# Patient Record
Sex: Female | Born: 1955 | Race: White | Hispanic: No | Smoking: Former smoker
Health system: Southern US, Community
[De-identification: ages and names within clinical notes are randomized; demographics above are authoritative.]

## PROBLEM LIST (undated history)

## (undated) DIAGNOSIS — F329 Major depressive disorder, single episode, unspecified: Secondary | ICD-10-CM

## (undated) DIAGNOSIS — I10 Essential (primary) hypertension: Secondary | ICD-10-CM

## (undated) DIAGNOSIS — R740 Nonspecific elevation of levels of transaminase and lactic acid dehydrogenase [LDH]: Secondary | ICD-10-CM

## (undated) DIAGNOSIS — T7840XA Allergy, unspecified, initial encounter: Secondary | ICD-10-CM

## (undated) DIAGNOSIS — F32A Depression, unspecified: Secondary | ICD-10-CM

## (undated) DIAGNOSIS — J209 Acute bronchitis, unspecified: Secondary | ICD-10-CM

## (undated) DIAGNOSIS — Z9889 Other specified postprocedural states: Secondary | ICD-10-CM

## (undated) DIAGNOSIS — Z86010 Personal history of colonic polyps: Secondary | ICD-10-CM

## (undated) DIAGNOSIS — J9602 Acute respiratory failure with hypercapnia: Secondary | ICD-10-CM

## (undated) DIAGNOSIS — E785 Hyperlipidemia, unspecified: Secondary | ICD-10-CM

## (undated) DIAGNOSIS — K219 Gastro-esophageal reflux disease without esophagitis: Secondary | ICD-10-CM

## (undated) DIAGNOSIS — E039 Hypothyroidism, unspecified: Secondary | ICD-10-CM

## (undated) DIAGNOSIS — D72829 Elevated white blood cell count, unspecified: Secondary | ICD-10-CM

## (undated) DIAGNOSIS — R112 Nausea with vomiting, unspecified: Secondary | ICD-10-CM

## (undated) DIAGNOSIS — F419 Anxiety disorder, unspecified: Secondary | ICD-10-CM

## (undated) DIAGNOSIS — K7689 Other specified diseases of liver: Secondary | ICD-10-CM

## (undated) DIAGNOSIS — J45909 Unspecified asthma, uncomplicated: Secondary | ICD-10-CM

## (undated) DIAGNOSIS — I350 Nonrheumatic aortic (valve) stenosis: Secondary | ICD-10-CM

## (undated) DIAGNOSIS — J302 Other seasonal allergic rhinitis: Secondary | ICD-10-CM

## (undated) DIAGNOSIS — E119 Type 2 diabetes mellitus without complications: Secondary | ICD-10-CM

## (undated) DIAGNOSIS — E66813 Obesity, class 3: Secondary | ICD-10-CM

## (undated) DIAGNOSIS — C801 Malignant (primary) neoplasm, unspecified: Secondary | ICD-10-CM

## (undated) DIAGNOSIS — N200 Calculus of kidney: Secondary | ICD-10-CM

## (undated) HISTORY — DX: Anxiety disorder, unspecified: F41.9

## (undated) HISTORY — DX: Gastro-esophageal reflux disease without esophagitis: K21.9

## (undated) HISTORY — DX: Essential (primary) hypertension: I10

## (undated) HISTORY — DX: Hypothyroidism, unspecified: E03.9

## (undated) HISTORY — DX: Acute respiratory failure with hypercapnia: J96.02

## (undated) HISTORY — DX: Malignant (primary) neoplasm, unspecified: C80.1

## (undated) HISTORY — DX: Allergy, unspecified, initial encounter: T78.40XA

## (undated) HISTORY — PX: POLYPECTOMY: SHX149

## (undated) HISTORY — DX: Elevated white blood cell count, unspecified: D72.829

## (undated) HISTORY — PX: OTHER SURGICAL HISTORY: SHX169

## (undated) HISTORY — DX: Nonrheumatic aortic (valve) stenosis: I35.0

## (undated) HISTORY — DX: Hyperlipidemia, unspecified: E78.5

## (undated) HISTORY — PX: TONSILLECTOMY: SUR1361

## (undated) HISTORY — DX: Depression, unspecified: F32.A

## (undated) HISTORY — DX: Other seasonal allergic rhinitis: J30.2

## (undated) HISTORY — DX: Type 2 diabetes mellitus without complications: E11.9

## (undated) HISTORY — DX: Major depressive disorder, single episode, unspecified: F32.9

---

## 1986-10-23 HISTORY — PX: MANDIBLE SURGERY: SHX707

## 2002-08-11 ENCOUNTER — Encounter: Payer: Self-pay | Admitting: Family Medicine

## 2002-08-11 ENCOUNTER — Ambulatory Visit (HOSPITAL_COMMUNITY): Admission: RE | Admit: 2002-08-11 | Discharge: 2002-08-11 | Payer: Self-pay | Admitting: Family Medicine

## 2005-07-17 ENCOUNTER — Ambulatory Visit (HOSPITAL_COMMUNITY): Admission: RE | Admit: 2005-07-17 | Discharge: 2005-07-17 | Payer: Self-pay | Admitting: Family Medicine

## 2005-10-23 HISTORY — PX: NOSE SURGERY: SHX723

## 2006-05-08 ENCOUNTER — Ambulatory Visit (HOSPITAL_COMMUNITY): Admission: RE | Admit: 2006-05-08 | Discharge: 2006-05-08 | Payer: Self-pay | Admitting: Family Medicine

## 2006-12-10 ENCOUNTER — Ambulatory Visit (HOSPITAL_COMMUNITY): Admission: RE | Admit: 2006-12-10 | Discharge: 2006-12-10 | Payer: Self-pay | Admitting: Family Medicine

## 2006-12-31 ENCOUNTER — Ambulatory Visit (HOSPITAL_COMMUNITY): Admission: RE | Admit: 2006-12-31 | Discharge: 2006-12-31 | Payer: Self-pay | Admitting: Family Medicine

## 2007-09-09 ENCOUNTER — Encounter: Payer: Self-pay | Admitting: Gastroenterology

## 2007-09-21 ENCOUNTER — Inpatient Hospital Stay (HOSPITAL_COMMUNITY): Admission: EM | Admit: 2007-09-21 | Discharge: 2007-09-24 | Payer: Self-pay | Admitting: Emergency Medicine

## 2008-06-17 ENCOUNTER — Encounter: Payer: Self-pay | Admitting: Gastroenterology

## 2008-06-24 ENCOUNTER — Ambulatory Visit (HOSPITAL_COMMUNITY): Admission: RE | Admit: 2008-06-24 | Discharge: 2008-06-24 | Payer: Self-pay | Admitting: Family Medicine

## 2008-08-05 ENCOUNTER — Ambulatory Visit: Payer: Self-pay | Admitting: Gastroenterology

## 2008-08-05 DIAGNOSIS — R933 Abnormal findings on diagnostic imaging of other parts of digestive tract: Secondary | ICD-10-CM

## 2008-08-05 DIAGNOSIS — R740 Nonspecific elevation of levels of transaminase and lactic acid dehydrogenase [LDH]: Secondary | ICD-10-CM

## 2008-08-05 DIAGNOSIS — K7689 Other specified diseases of liver: Secondary | ICD-10-CM

## 2008-08-05 DIAGNOSIS — E669 Obesity, unspecified: Secondary | ICD-10-CM

## 2008-08-05 DIAGNOSIS — R7402 Elevation of levels of lactic acid dehydrogenase (LDH): Secondary | ICD-10-CM

## 2008-08-05 DIAGNOSIS — R7401 Elevation of levels of liver transaminase levels: Secondary | ICD-10-CM

## 2008-08-05 HISTORY — DX: Other specified diseases of liver: K76.89

## 2008-08-05 HISTORY — DX: Elevation of levels of lactic acid dehydrogenase (LDH): R74.02

## 2008-08-05 HISTORY — DX: Elevation of levels of liver transaminase levels: R74.01

## 2008-08-08 LAB — CONVERTED CEMR LAB
A-1 Antitrypsin, Ser: 173 mg/dL (ref 83–200)
Angiotensin 1 Converting Enzyme: 18 units/L (ref 9–67)
Anti Nuclear Antibody(ANA): NEGATIVE
Ceruloplasmin: 44 mg/dL (ref 21–63)
HCV Ab: NEGATIVE
Hepatitis B Surface Ag: NEGATIVE

## 2008-08-10 LAB — CONVERTED CEMR LAB
ALT: 131 units/L — ABNORMAL HIGH (ref 0–35)
AST: 107 units/L — ABNORMAL HIGH (ref 0–37)
Alkaline Phosphatase: 89 units/L (ref 39–117)
Bilirubin, Direct: 0.2 mg/dL (ref 0.0–0.3)
Ferritin: 261.6 ng/mL (ref 10.0–291.0)
INR: 1.1 — ABNORMAL HIGH (ref 0.8–1.0)
Prothrombin Time: 12.8 s (ref 10.9–13.3)
Saturation Ratios: 27.8 % (ref 20.0–50.0)
Total Bilirubin: 1.6 mg/dL — ABNORMAL HIGH (ref 0.3–1.2)

## 2008-08-13 ENCOUNTER — Ambulatory Visit: Payer: Self-pay | Admitting: Gastroenterology

## 2008-08-13 ENCOUNTER — Encounter: Payer: Self-pay | Admitting: Gastroenterology

## 2008-08-14 ENCOUNTER — Encounter: Payer: Self-pay | Admitting: Gastroenterology

## 2008-09-01 ENCOUNTER — Ambulatory Visit: Payer: Self-pay | Admitting: Gastroenterology

## 2008-09-01 DIAGNOSIS — Z860100 Personal history of colon polyps, unspecified: Secondary | ICD-10-CM | POA: Insufficient documentation

## 2008-09-01 HISTORY — DX: Personal history of colon polyps, unspecified: Z86.0100

## 2008-09-01 HISTORY — DX: Personal history of colonic polyps: Z86.010

## 2009-09-08 ENCOUNTER — Ambulatory Visit (HOSPITAL_COMMUNITY): Admission: RE | Admit: 2009-09-08 | Discharge: 2009-09-08 | Payer: Self-pay | Admitting: Family Medicine

## 2010-01-12 ENCOUNTER — Ambulatory Visit (HOSPITAL_COMMUNITY): Admission: RE | Admit: 2010-01-12 | Discharge: 2010-01-12 | Payer: Self-pay | Admitting: Family Medicine

## 2010-11-14 ENCOUNTER — Encounter: Payer: Self-pay | Admitting: Family Medicine

## 2011-02-14 ENCOUNTER — Other Ambulatory Visit (HOSPITAL_COMMUNITY): Payer: Self-pay | Admitting: Family Medicine

## 2011-02-14 DIAGNOSIS — Z139 Encounter for screening, unspecified: Secondary | ICD-10-CM

## 2011-02-20 ENCOUNTER — Ambulatory Visit (HOSPITAL_COMMUNITY)
Admission: RE | Admit: 2011-02-20 | Discharge: 2011-02-20 | Disposition: A | Discharge status: Home / Self Care | Payer: BC Managed Care – PPO | Source: Ambulatory Visit | Attending: Family Medicine | Admitting: Family Medicine

## 2011-02-20 DIAGNOSIS — Z139 Encounter for screening, unspecified: Secondary | ICD-10-CM

## 2011-02-20 DIAGNOSIS — Z1231 Encounter for screening mammogram for malignant neoplasm of breast: Secondary | ICD-10-CM | POA: Insufficient documentation

## 2011-03-02 ENCOUNTER — Emergency Department (HOSPITAL_COMMUNITY): Payer: BC Managed Care – PPO

## 2011-03-02 ENCOUNTER — Emergency Department (HOSPITAL_COMMUNITY)
Admission: EM | Admit: 2011-03-02 | Discharge: 2011-03-03 | Disposition: A | Discharge status: Home / Self Care | Payer: BC Managed Care – PPO | Attending: Emergency Medicine | Admitting: Emergency Medicine

## 2011-03-02 DIAGNOSIS — E039 Hypothyroidism, unspecified: Secondary | ICD-10-CM | POA: Insufficient documentation

## 2011-03-02 DIAGNOSIS — R110 Nausea: Secondary | ICD-10-CM | POA: Insufficient documentation

## 2011-03-02 DIAGNOSIS — B9789 Other viral agents as the cause of diseases classified elsewhere: Secondary | ICD-10-CM | POA: Insufficient documentation

## 2011-03-02 DIAGNOSIS — I10 Essential (primary) hypertension: Secondary | ICD-10-CM | POA: Insufficient documentation

## 2011-03-02 DIAGNOSIS — R509 Fever, unspecified: Secondary | ICD-10-CM | POA: Insufficient documentation

## 2011-03-02 DIAGNOSIS — E119 Type 2 diabetes mellitus without complications: Secondary | ICD-10-CM | POA: Insufficient documentation

## 2011-03-02 DIAGNOSIS — R51 Headache: Secondary | ICD-10-CM | POA: Insufficient documentation

## 2011-03-02 DIAGNOSIS — Z79899 Other long term (current) drug therapy: Secondary | ICD-10-CM | POA: Insufficient documentation

## 2011-03-02 LAB — URINALYSIS, ROUTINE W REFLEX MICROSCOPIC
Bilirubin Urine: NEGATIVE
Glucose, UA: NEGATIVE mg/dL
Hgb urine dipstick: NEGATIVE
Nitrite: NEGATIVE
Protein, ur: NEGATIVE mg/dL
Urobilinogen, UA: 0.2 mg/dL (ref 0.0–1.0)
pH: 5.5 (ref 5.0–8.0)

## 2011-03-02 LAB — DIFFERENTIAL
Eosinophils Relative: 3 % (ref 0–5)
Lymphs Abs: 1.1 10*3/uL (ref 0.7–4.0)
Monocytes Absolute: 0.6 10*3/uL (ref 0.1–1.0)
Neutro Abs: 4.6 10*3/uL (ref 1.7–7.7)
Neutrophils Relative %: 70 % (ref 43–77)

## 2011-03-02 LAB — BASIC METABOLIC PANEL
Creatinine, Ser: 0.53 mg/dL (ref 0.4–1.2)
Potassium: 4.1 mEq/L (ref 3.5–5.1)
Sodium: 135 mEq/L (ref 135–145)

## 2011-03-02 LAB — CBC
MCV: 83.1 fL (ref 78.0–100.0)
Platelets: 178 10*3/uL (ref 150–400)

## 2011-03-07 NOTE — H&P (Signed)
Sarah Pope, Sarah Pope               ACCOUNT NO.:  [id number]   MEDICAL RECORD NO.:  [id number]          PATIENT TYPE:  INP   LOCATION:  A331                          FACILITY:  [facility]   PHYSICIAN:  Scott A. Gerda Diss, MD    DATE OF BIRTH:  1956/06/22   DATE OF ADMISSION:  09/21/2007  DATE OF DISCHARGE:  LH                              HISTORY & PHYSICAL   CHIEF COMPLAINT:  Vomiting, diarrhea.   HOPI:  [age]-year-old female started on Wednesday with vomiting, started  having significant nausea, multiple vomiting and then started having  abdominal discomfort.  The vomiting stopped by about 3 a.m. on Thursday  morning and then she had multiple spells of diarrhea throughout the day  and felt very bloated, increased pain and discomfort, ongoing watery  stools, nausea, unable to keep things down so she was not even trying to  drink, ongoing diarrhea throughout Wednesday.  Friday, the diarrhea  started to slow up, discomfort has been ongoing and cramping and feeling  like there is just a bloated feeling in her abdomen and then patient  also states that she started continuing to have the diarrhea Friday into  Saturday morning and then on Saturday morning she tried to eat  something, it came back up, still had the diarrhea so she came to the  emergency department.   PAST MEDICAL HISTORY:  It should be noted that patient has hypertension,  diabetes, hypothyroidism.   SURGICAL HISTORY:  None.   SOCIAL HISTORY:  Does not smoke or drink.   ALLERGIES:  None reported.   It should be noted she is on Celexa, metformin, Synthroid.   Her O2 sat was normal.   TMs:  __________ .  She looks like she feels awful.  She is not toxic  though.  NECK:  Supple.  LUNGS:  CTA.  No crackles.  HEART:  Regular, no murmurs.  ABDOMEN:  Soft, yet mild distention and diffuse mild tenderness, no  rebound.  RECTAL:  Per ER, watery, heme negative.   It should also be noted that the patient had a MET 7 which showed  a  potassium of 3.5, bicarbonate of 24, glucose 155, BUN 13, creatinine  0.89.  It should also be noted lipase 31, lactase LDH was 166.  Lactic  acid 1.7.  White count 13.2, hemoglobin 13.3, hematocrit 40.4, left  shift noted.   X-RAYS:  Shows mild dilation of the colon with possibility of ileus vs.  Obstruction.   A/P:  Abdominal pain - This really has the course in the history of a  severe gastroenteritis that is related into ileus.  I do not feel she  has an intestinal obstruction, but she does have some significant loops  of bowel.  I think it is reasonable that we keep her in the hospital.  I  also think that if we are not seeing improvement in things the next step  would be is doing a CT scan.  We should repeat the lab work tomorrow  morning.      Scott A. Gerda Diss, MD  Electronically Signed  SAL/MEDQ  D:  09/22/2007  T:  09/22/2007  Job:  [id number]

## 2011-03-07 NOTE — Discharge Summary (Signed)
[name], Sarah Pope               ACCOUNT NO.:  [id number]   MEDICAL RECORD NO.:  [id number]          PATIENT TYPE:  INP   LOCATION:  A331                          FACILITY:  [facility]   PHYSICIAN:  Scott A. Gerda Diss, MD    DATE OF BIRTH:  [birth year]   DATE OF ADMISSION:  09/21/2007  DATE OF DISCHARGE:  LH                               DISCHARGE SUMMARY   DISCHARGE DIAGNOSES:  1. Gastroenteritis.  2. Abdominal pain.  3. Hypertension.  4. Diabetes.  5. Hepatitis.   HOSPITAL COURSE:  This patient was admitted into the hospital and had  some significant nausea, vomiting, diarrhea, abdominal discomfort,  bloating, abdominal pain and was unable to keep anything down.  She was  in the emergency department in need of IV fluids with medications  __________  . She had dilation of colon with possible ileus versus  obstruction.  She was admitted in.  She continued to have swelling of  her abdomen along with nausea, vomiting, and diarrhea into Monday  morning.  Because of the abdominal tenderness and tympani on percussion,  it was felt best to do a CT scan of her abdomen and that was ordered.  CT of the pelvis was negative and CT of the abdomen showed diffuse fatty  liver.  She does suffer with obesity.  The rest of the abdomen looked  okay.  Repeat lab work showed a good potassium, kidney function, and  white count.  Amylase and lipase and liver functions were okay.  She was  able to take liquids and tolerate crackers, so we advanced her diet on  December 2, and she was able to be discharged to home to follow up with  [place] somewhere in the next couple weeks, sooner if any problems.  May  return to work on December 8.      Scott A. Gerda Diss, MD  Electronically Signed     SAL/MEDQ  D:  09/24/2007  T:  09/24/2007  Job:  [id number]

## 2011-03-07 NOTE — Group Therapy Note (Signed)
Sarah Pope, WESTRA               ACCOUNT NO.:  [id number]   MEDICAL RECORD NO.:  [id number]          PATIENT TYPE:  INP   LOCATION:  A331                          FACILITY:  [facility]   PHYSICIAN:  Scott A. Luking, MD    DATE OF BIRTH:  1956/05/20   DATE OF PROCEDURE:  DATE OF DISCHARGE:                                 PROGRESS NOTE   Her __________ 7 this morning, potassium is up 3.9, BUN and creatinine  looked good and glucose looks good.  Her white count is down to 10,000  with no specific shift.  Her urine from yesterday should be noted as  being negative and it should also be noted that she is not running a  fever and she feels pretty comfortable.  Some mild discomfort in the  abdomen, slight distension, no guarding or rebound.  She has taken sips  of liquid.  I would suggest that we follow her closely, if she is not  resolving with __________ by tomorrow, then I think that the next step  would be is doing a CT scan.      Scott A. Gerda Diss, MD  Electronically Signed     SAL/MEDQ  D:  09/22/2007  T:  09/22/2007  Job:  [id number]

## 2011-07-25 LAB — GLUCOSE, CAPILLARY
Glucose-Capillary: 133 — ABNORMAL HIGH
Glucose-Capillary: 160 — ABNORMAL HIGH

## 2011-07-31 LAB — FECAL LACTOFERRIN, QUANT: Fecal Lactoferrin: NEGATIVE

## 2011-07-31 LAB — STOOL CULTURE

## 2011-07-31 LAB — CBC
HCT: 35.2 — ABNORMAL LOW
MCHC: 33.5
RBC: 4.33
WBC: 6.6

## 2011-07-31 LAB — DIFFERENTIAL
Basophils Absolute: 0
Lymphs Abs: 2.1
Monocytes Absolute: 0.6

## 2011-07-31 LAB — BASIC METABOLIC PANEL
BUN: 6
CO2: 26
Creatinine, Ser: 0.75
Potassium: 4.3
Sodium: 139

## 2011-08-01 LAB — DIFFERENTIAL
Basophils Absolute: 0
Basophils Relative: 0
Basophils Relative: 0
Eosinophils Absolute: 0.1 — ABNORMAL LOW
Eosinophils Relative: 1
Lymphs Abs: 1.6
Neutro Abs: 7.2
Neutrophils Relative %: 70

## 2011-08-01 LAB — AMYLASE: Amylase: 35

## 2011-08-01 LAB — BASIC METABOLIC PANEL
BUN: 13
CO2: 29
Calcium: 8.7
Calcium: 8.8
Creatinine, Ser: 0.72
Creatinine, Ser: 0.89
GFR calc Af Amer: >60
GFR calc non Af Amer: >60
Glucose, Bld: 108 — ABNORMAL HIGH
Glucose, Bld: 155 — ABNORMAL HIGH
Potassium: 3.5

## 2011-08-01 LAB — URINALYSIS, ROUTINE W REFLEX MICROSCOPIC
Bilirubin Urine: NEGATIVE
Glucose, UA: NEGATIVE
Hgb urine dipstick: NEGATIVE
Protein, ur: NEGATIVE
Specific Gravity, Urine: >1.03 — ABNORMAL HIGH
pH: 5.5

## 2011-08-01 LAB — CBC
HCT: 40.4
MCHC: 32.8
MCHC: 33.4
MCV: 81.9
Platelets: 277
RBC: 4.38
RDW: 12.4
WBC: 13.2 — ABNORMAL HIGH

## 2011-09-07 ENCOUNTER — Encounter: Payer: Self-pay | Admitting: Gastroenterology

## 2011-09-13 ENCOUNTER — Ambulatory Visit (HOSPITAL_COMMUNITY)
Admission: RE | Admit: 2011-09-13 | Discharge: 2011-09-13 | Disposition: A | Discharge status: Home / Self Care | Payer: BC Managed Care – PPO | Source: Ambulatory Visit | Attending: Family Medicine | Admitting: Family Medicine

## 2011-09-13 ENCOUNTER — Other Ambulatory Visit: Payer: Self-pay | Admitting: Family Medicine

## 2011-09-13 DIAGNOSIS — J45909 Unspecified asthma, uncomplicated: Secondary | ICD-10-CM | POA: Insufficient documentation

## 2011-09-13 DIAGNOSIS — R062 Wheezing: Secondary | ICD-10-CM

## 2011-10-24 HISTORY — PX: COLONOSCOPY: SHX174

## 2011-12-08 ENCOUNTER — Encounter: Payer: Self-pay | Admitting: Gastroenterology

## 2012-01-24 ENCOUNTER — Ambulatory Visit (AMBULATORY_SURGERY_CENTER): Payer: Medicaid Other | Admitting: *Deleted

## 2012-01-24 VITALS — Ht 65.0 in | Wt 227.0 lb

## 2012-01-24 DIAGNOSIS — Z86010 Personal history of colonic polyps: Secondary | ICD-10-CM

## 2012-01-24 DIAGNOSIS — Z1211 Encounter for screening for malignant neoplasm of colon: Secondary | ICD-10-CM

## 2012-01-24 MED ORDER — PEG-KCL-NACL-NASULF-NA ASC-C 100 G PO SOLR: 1.0000 | Freq: Once | ORAL | Status: DC

## 2012-01-31 ENCOUNTER — Other Ambulatory Visit: Payer: Self-pay | Admitting: Family Medicine

## 2012-01-31 DIAGNOSIS — Z139 Encounter for screening, unspecified: Secondary | ICD-10-CM

## 2012-02-07 ENCOUNTER — Encounter: Payer: Self-pay | Admitting: Gastroenterology

## 2012-02-07 ENCOUNTER — Ambulatory Visit (AMBULATORY_SURGERY_CENTER): Payer: Medicaid Other | Admitting: Gastroenterology

## 2012-02-07 VITALS — BP 123/75 | HR 84 | Temp 99.2°F | Resp 20 | Ht 65.0 in | Wt 227.0 lb

## 2012-02-07 DIAGNOSIS — Z1211 Encounter for screening for malignant neoplasm of colon: Secondary | ICD-10-CM

## 2012-02-07 DIAGNOSIS — D126 Benign neoplasm of colon, unspecified: Secondary | ICD-10-CM

## 2012-02-07 DIAGNOSIS — Z86010 Personal history of colonic polyps: Secondary | ICD-10-CM

## 2012-02-07 MED ORDER — SODIUM CHLORIDE 0.9 % IV SOLN: 500.0000 mL | INTRAVENOUS | Status: DC

## 2012-02-07 NOTE — Patient Instructions (Signed)
YOU HAD AN ENDOSCOPIC PROCEDURE TODAY AT THE [facility]: Refer to the procedure report that was given to you for any specific questions about what was found during the examination.  If the procedure report does not answer your questions, please call your gastroenterologist to clarify.  If you requested that your care partner not be given the details of your procedure findings, then the procedure report has been included in a sealed envelope for you to review at your convenience later.  YOU SHOULD EXPECT: Some feelings of bloating in the abdomen. Passage of more gas than usual.  Walking can help get rid of the air that was put into your GI tract during the procedure and reduce the bloating. If you had a lower endoscopy (such as a colonoscopy or flexible sigmoidoscopy) you may notice spotting of blood in your stool or on the toilet paper. If you underwent a bowel prep for your procedure, then you may not have a normal bowel movement for a few days.  DIET: Your first meal following the procedure should be a light meal and then it is ok to progress to your normal diet.  A half-sandwich or bowl of soup is an example of a good first meal.  Heavy or fried foods are harder to digest and may make you feel nauseous or bloated.  Likewise meals heavy in dairy and vegetables can cause extra gas to form and this can also increase the bloating.  Drink plenty of fluids but you should avoid alcoholic beverages for 24 hours.  ACTIVITY: Your care partner should take you home directly after the procedure.  You should plan to take it easy, moving slowly for the rest of the day.  You can resume normal activity the day after the procedure however you should NOT DRIVE or use heavy machinery for 24 hours (because of the sedation medicines used during the test).    SYMPTOMS TO REPORT IMMEDIATELY: A gastroenterologist can be reached at any hour.  During normal business hours, 8:30 AM to 5:00 PM Monday through Friday,  call [phone].  After hours and on weekends, please call the GI answering service at [phone] who will take a message and have the physician on call contact you.   Following lower endoscopy (colonoscopy or flexible sigmoidoscopy):  Excessive amounts of blood in the stool  Significant tenderness or worsening of abdominal pains  Swelling of the abdomen that is new, acute  Fever of 100F or higher  Following upper endoscopy (EGD)  Vomiting of blood or coffee ground material  New chest pain or pain under the shoulder blades  Painful or persistently difficult swallowing  New shortness of breath  Fever of 100F or higher  Black, tarry-looking stools  FOLLOW UP: If any biopsies were taken you will be contacted by phone or by letter within the next 1-3 weeks.  Call your gastroenterologist if you have not heard about the biopsies in 3 weeks.  Our staff will call the home number listed on your records the next business day following your procedure to check on you and address any questions or concerns that you may have at that time regarding the information given to you following your procedure. This is a courtesy call and so if there is no answer at the home number and we have not heard from you through the emergency physician on call, we will assume that you have returned to your regular daily activities without incident.  SIGNATURES/CONFIDENTIALITY: You and/or your care   partner have signed paperwork which will be entered into your electronic medical record.  These signatures attest to the fact that that the information above on your After Visit Summary has been reviewed and is understood.  Full responsibility of the confidentiality of this discharge information lies with you and/or your care-partner.   HANDOUT ON POLYPS REPEAT COLONOSCOPY IN 5 YEARS 

## 2012-02-07 NOTE — Progress Notes (Signed)
Patient did not experience any of the following events: a burn prior to discharge; a fall within the facility; wrong site/side/patient/procedure/implant event; or a hospital transfer or hospital admission upon discharge from the facility. (G8907) Patient did not have preoperative order for IV antibiotic SSI prophylaxis. (G8918)  

## 2012-02-07 NOTE — Op Note (Signed)
[facility] [address] [place], [place]  [address]  COLONOSCOPY PROCEDURE REPORT PATIENT:  Sarah Pope, Sarah Pope  MR#:  [id number] BIRTHDATE:  06-26-1956, 55 yrs. old  GENDER:  female ENDOSCOPIST:  Judie Petit T. Russella Dar, MD, [facility]  PROCEDURE DATE:  02/07/2012 PROCEDURE:  Colonoscopy with snare polypectomy ASA CLASS:  Class II INDICATIONS:  1) surveillance and high-risk screening  2) history of pre-cancerous (adenomatous) colon polyps: 07/2008 MEDICATIONS:   These medications were titrated to patient response per physician's verbal order, Fentanyl 75 mcg IV, Versed 9 mg IV DESCRIPTION OF PROCEDURE:   After the risks benefits and alternatives of the procedure were thoroughly explained, informed consent was obtained.  Digital rectal exam was performed and revealed no abnormalities.   The LB [id number] endoscope was introduced through the anus and advanced to the cecum, which was identified by both the appendix and ileocecal valve, without limitations.  The quality of the prep was good, using MoviPrep. The instrument was then slowly withdrawn as the colon was fully examined. <<PROCEDUREIMAGES>> FINDINGS:  A sessile polyp was found in the mid transverse colon. It was 5 mm in size. Polyp was snared without cautery. Retrieval was unsuccessful.  A sessile polyp was found in the descending colon. It was 5 mm in size. Polyp was snared without cautery. Retrieval was successful.  A sessile polyp was found in the sigmoid colon. It was 5 mm in size. Polyp was snared without cautery. Retrieval was successful.  Otherwise normal colonoscopy without other polyps, masses, vascular ectasias, or inflammatory changes.  Retroflexed views in the rectum revealed no abnormalities.    The time to cecum =  2.5  minutes. The scope was then withdrawn (time =  12.75  min) from the patient and the procedure completed.  COMPLICATIONS:  None  ENDOSCOPIC IMPRESSION: 1) 5 mm sessile polyp in the mid  transverse colon 2) 5 mm sessile polyp in the descending colon 3) 5 mm sessile polyp in the sigmoid colon  RECOMMENDATIONS: 1) Await pathology results 2) Repeat Colonoscopy in 5 years.  Venita Lick. Russella Dar, MD, [facility]  CC:  Lilyan Punt, MD  n. Rosalie DoctorVenita Lick. Anatalia Kronk at 02/07/2012 01:56 PM  Loletha Grayer, [phone]

## 2012-02-08 ENCOUNTER — Telehealth: Payer: Self-pay

## 2012-02-08 NOTE — Telephone Encounter (Signed)
  Follow up Call-  Call back number 02/07/2012  Post procedure Call Back phone  # [phone]  Permission to leave phone message Yes     Patient questions:  Do you have a fever, pain , or abdominal swelling? no Pain Score  0 *  Have you tolerated food without any problems? yes  Have you been able to return to your normal activities? yes  Do you have any questions about your discharge instructions: Diet   no Medications  no Follow up visit  no  Do you have questions or concerns about your Care? no  Actions: * If pain score is 4 or above: No action needed, pain <4.

## 2012-02-12 ENCOUNTER — Encounter: Payer: Self-pay | Admitting: Gastroenterology

## 2012-02-22 ENCOUNTER — Ambulatory Visit (HOSPITAL_COMMUNITY): Payer: Medicaid Other

## 2012-06-29 ENCOUNTER — Encounter (HOSPITAL_COMMUNITY): Payer: Self-pay | Admitting: Emergency Medicine

## 2012-06-29 ENCOUNTER — Inpatient Hospital Stay (HOSPITAL_COMMUNITY)
Admission: EM | Admit: 2012-06-29 | Discharge: 2012-07-03 | DRG: 189 | Disposition: A | Discharge status: Home / Self Care | Payer: Medicaid Other | Attending: Internal Medicine | Admitting: Internal Medicine

## 2012-06-29 DIAGNOSIS — E871 Hypo-osmolality and hyponatremia: Secondary | ICD-10-CM | POA: Diagnosis present

## 2012-06-29 DIAGNOSIS — R748 Abnormal levels of other serum enzymes: Secondary | ICD-10-CM | POA: Diagnosis present

## 2012-06-29 DIAGNOSIS — I10 Essential (primary) hypertension: Secondary | ICD-10-CM | POA: Diagnosis present

## 2012-06-29 DIAGNOSIS — J209 Acute bronchitis, unspecified: Secondary | ICD-10-CM | POA: Diagnosis present

## 2012-06-29 DIAGNOSIS — Z794 Long term (current) use of insulin: Secondary | ICD-10-CM

## 2012-06-29 DIAGNOSIS — Z87891 Personal history of nicotine dependence: Secondary | ICD-10-CM

## 2012-06-29 DIAGNOSIS — R0603 Acute respiratory distress: Secondary | ICD-10-CM

## 2012-06-29 DIAGNOSIS — J302 Other seasonal allergic rhinitis: Secondary | ICD-10-CM | POA: Diagnosis present

## 2012-06-29 DIAGNOSIS — R000 Tachycardia, unspecified: Secondary | ICD-10-CM | POA: Diagnosis present

## 2012-06-29 DIAGNOSIS — I498 Other specified cardiac arrhythmias: Secondary | ICD-10-CM | POA: Diagnosis present

## 2012-06-29 DIAGNOSIS — J9600 Acute respiratory failure, unspecified whether with hypoxia or hypercapnia: Principal | ICD-10-CM | POA: Diagnosis present

## 2012-06-29 DIAGNOSIS — E785 Hyperlipidemia, unspecified: Secondary | ICD-10-CM | POA: Diagnosis present

## 2012-06-29 DIAGNOSIS — E119 Type 2 diabetes mellitus without complications: Secondary | ICD-10-CM | POA: Diagnosis present

## 2012-06-29 DIAGNOSIS — F3289 Other specified depressive episodes: Secondary | ICD-10-CM | POA: Diagnosis present

## 2012-06-29 DIAGNOSIS — K219 Gastro-esophageal reflux disease without esophagitis: Secondary | ICD-10-CM | POA: Diagnosis present

## 2012-06-29 DIAGNOSIS — Z23 Encounter for immunization: Secondary | ICD-10-CM

## 2012-06-29 DIAGNOSIS — J309 Allergic rhinitis, unspecified: Secondary | ICD-10-CM | POA: Diagnosis present

## 2012-06-29 DIAGNOSIS — J9602 Acute respiratory failure with hypercapnia: Secondary | ICD-10-CM

## 2012-06-29 DIAGNOSIS — K7689 Other specified diseases of liver: Secondary | ICD-10-CM | POA: Diagnosis present

## 2012-06-29 DIAGNOSIS — F329 Major depressive disorder, single episode, unspecified: Secondary | ICD-10-CM | POA: Diagnosis present

## 2012-06-29 DIAGNOSIS — E669 Obesity, unspecified: Secondary | ICD-10-CM | POA: Diagnosis present

## 2012-06-29 DIAGNOSIS — Z6838 Body mass index (BMI) 38.0-38.9, adult: Secondary | ICD-10-CM

## 2012-06-29 DIAGNOSIS — E039 Hypothyroidism, unspecified: Secondary | ICD-10-CM | POA: Diagnosis present

## 2012-06-29 DIAGNOSIS — D72829 Elevated white blood cell count, unspecified: Secondary | ICD-10-CM | POA: Diagnosis present

## 2012-06-29 DIAGNOSIS — R7989 Other specified abnormal findings of blood chemistry: Secondary | ICD-10-CM | POA: Diagnosis present

## 2012-06-29 DIAGNOSIS — Z79899 Other long term (current) drug therapy: Secondary | ICD-10-CM

## 2012-06-29 HISTORY — DX: Nonspecific elevation of levels of transaminase and lactic acid dehydrogenase (ldh): R74.0

## 2012-06-29 HISTORY — DX: Obesity, class 3: E66.813

## 2012-06-29 HISTORY — DX: Morbid (severe) obesity due to excess calories: E66.01

## 2012-06-29 HISTORY — DX: Other specified diseases of liver: K76.89

## 2012-06-29 HISTORY — DX: Personal history of colonic polyps: Z86.010

## 2012-06-29 MED ORDER — METHYLPREDNISOLONE SODIUM SUCC 125 MG IJ SOLR
125.0000 mg | Freq: Once | INTRAMUSCULAR | Status: AC
  Administered 2012-06-30: 125 mg via INTRAVENOUS
  Filled 2012-06-29: qty 2

## 2012-06-29 MED ORDER — ALBUTEROL SULFATE (5 MG/ML) 0.5% IN NEBU
5.0000 mg | INHALATION_SOLUTION | Freq: Once | RESPIRATORY_TRACT | Status: AC
  Administered 2012-06-29: 5 mg via RESPIRATORY_TRACT
  Filled 2012-06-29: qty 1

## 2012-06-29 MED ORDER — ALBUTEROL (5 MG/ML) CONTINUOUS INHALATION SOLN
INHALATION_SOLUTION | RESPIRATORY_TRACT | Status: AC
  Administered 2012-06-29: 10 mg/h
  Filled 2012-06-29: qty 20

## 2012-06-29 MED ORDER — IPRATROPIUM BROMIDE 0.02 % IN SOLN
RESPIRATORY_TRACT | Status: AC
  Administered 2012-06-29: 1 mg
  Filled 2012-06-29: qty 5

## 2012-06-29 MED ORDER — MORPHINE SULFATE 4 MG/ML IJ SOLN
2.0000 mg | Freq: Once | INTRAMUSCULAR | Status: AC
  Administered 2012-06-30: 2 mg via INTRAVENOUS
  Filled 2012-06-29: qty 1

## 2012-06-29 MED ORDER — ONDANSETRON HCL 4 MG/2ML IJ SOLN
4.0000 mg | Freq: Once | INTRAMUSCULAR | Status: AC
  Administered 2012-06-30: 4 mg via INTRAVENOUS
  Filled 2012-06-29: qty 2

## 2012-06-29 NOTE — ED Provider Notes (Signed)
History   This chart was scribed for [employer]. Colon Branch, MD, by Frederik Pear. The patient was seen in room APA08/APA08 and the patient's care was started at 2317.    CSN: [id number]  Arrival date & time 06/29/12  2254   First MD Initiated Contact with Patient 06/29/12 2317      Chief Complaint  Patient presents with  . Shortness of Breath    (Consider location/radiation/quality/duration/timing/severity/associated sxs/prior treatment) HPI Sarah Pope is a 56 y.o. female who presents to the Emergency Department complaining of  moderate, Acute onset SOB that began today. Pt reports associated symptoms of coughing that has been present for 3 days, lower back pain, fever, and diaphoresis. Pain worsens with coughing. Pt reports that she have never been on a ventilator or hospitalized for breathing problems. Pt has a nebulizer at home and reports that is has had no effect.  PCP is [name].  Past Medical History  Diagnosis Date  . Seasonal allergies   . Depression   . Diabetes mellitus   . GERD (gastroesophageal reflux disease)   . Hyperlipidemia   . Hypertension   . Hypothyroidism     Past Surgical History  Procedure Date  . Mandible surgery 1988    realignment  . Nose surgery 2007    sinus infections    Family History  Problem Relation Age of Onset  . Colon cancer Neg Hx   . Esophageal cancer Neg Hx   . Rectal cancer Neg Hx   . Stomach cancer Neg Hx     History  Substance Use Topics  . Smoking status: Former Smoker    Quit date: 10/24/1991  . Smokeless tobacco: Never Used  . Alcohol Use: No    OB History    Grav Para Term Preterm Abortions TAB SAB Ect Mult Living                  Review of Systems  Constitutional: Negative for fever.       10 Systems reviewed and are negative for acute change except as noted in the HPI.  HENT: Negative for congestion.   Eyes: Negative for discharge and redness.  Respiratory: Positive for cough, chest tightness,  shortness of breath and wheezing.   Cardiovascular: Negative for chest pain.  Gastrointestinal: Negative for vomiting and abdominal pain.  Musculoskeletal: Negative for back pain.  Skin: Negative for rash.  Neurological: Negative for syncope, numbness and headaches.  Psychiatric/Behavioral:       No behavior change.  A complete 10 system review of systems was obtained and all systems are negative except as noted in the HPI and PMH.    Allergies  Codeine  Home Medications   Current Outpatient Rx  Name Route Sig Dispense Refill  . CETIRIZINE HCL 10 MG PO CHEW Oral Chew 10 mg by mouth daily.    Marland Kitchen CITALOPRAM HYDROBROMIDE 40 MG PO TABS Oral Take 40 mg by mouth daily.    . INSULIN GLARGINE 100 UNIT/ML [place] SOLN Subcutaneous Inject 30 Units into the skin at bedtime.    . INSULIN LISPRO (HUMAN) 100 UNIT/ML [place] SOLN Subcutaneous Inject into the skin 3 (three) times daily before meals.    Marland Kitchen LEVOTHYROXINE SODIUM 175 MCG PO TABS Oral Take 175 mcg by mouth daily.    Marland Kitchen LISINOPRIL-HYDROCHLOROTHIAZIDE 10-12.5 MG PO TABS Oral Take 1 tablet by mouth daily.    Marland Kitchen METFORMIN HCL ER (MOD) 1000 MG PO TB24 Oral Take 1,000 mg by mouth 2 (two)  times daily with a meal.    . OXAZEPAM 10 MG PO CAPS Oral Take 10 mg by mouth at bedtime as needed.    Marland Kitchen PEG-KCL-NACL-NASULF-NA ASC-C 100 G PO SOLR Oral Take 1 kit (100 g total) by mouth once. 1 kit 0    moviprep as directed  . PRAVASTATIN SODIUM 20 MG PO TABS Oral Take 20 mg by mouth daily.      BP 174/77  Pulse 129  Temp 98.7 F (37.1 C) (Oral)  Resp 28  Ht 5\' 5"  (1.651 m)  Wt 220 lb (99.791 kg)  BMI 36.61 kg/m2  SpO2 93%  Physical Exam  Nursing note and vitals reviewed. Constitutional: She is oriented to person, place, and time. She appears well-developed and well-nourished. No distress.       Awake, alert, nontoxic appearance.  HENT:  Head: Normocephalic and atraumatic.  Eyes: EOM are normal. Pupils are equal, round, and reactive to light. Right eye  exhibits no discharge. Left eye exhibits no discharge.  Neck: Neck supple. No tracheal deviation present.  Cardiovascular: Regular rhythm, normal heart sounds and intact distal pulses.  Tachycardia present.        Tachycardia  Pulmonary/Chest: She is in respiratory distress. She has wheezes. She has rales. She exhibits no tenderness.       Inspiratory and expiratory wheezing throughout. Respiratory distress. Pt is not able to speak in complete sentences. Rales at both bases. Increased respiratory efforts and poor air movement.  Abdominal: Soft. She exhibits no distension. There is no tenderness. There is no rebound.  Musculoskeletal: Normal range of motion. She exhibits no edema and no tenderness.       No peripheral edema.  Neurological: She is alert and oriented to person, place, and time. No sensory deficit.       Mental status and motor strength appears baseline for patient and situation.  Skin: Skin is warm and dry. No rash noted.  Psychiatric: She has a normal mood and affect. Her behavior is normal.    ED Course  Procedures (including critical care time) Results for orders placed during the hospital encounter of 06/29/12  BLOOD GAS, ARTERIAL      Component Value Range   O2 Content 2.0     Delivery systems NASAL CANNULA     pH, Arterial 7.280 (*) 7.350 - 7.450   pCO2 arterial 49.6 (*) 35.0 - 45.0 mmHg   pO2, Arterial 79.3 (*) 80.0 - 100.0 mmHg   Bicarbonate 22.6  20.0 - 24.0 mEq/L   TCO2 20.6  0 - 100 mmol/L   Acid-base deficit 3.2 (*) 0.0 - 2.0 mmol/L   O2 Saturation 94.2     Patient temperature 37.0     Collection site BRACHIAL ARTERY     Drawn by COLLECTED BY RT     Sample type ARTERIAL     Allens test (pass/fail) NOT INDICATED (*) PASS  CBC WITH DIFFERENTIAL      Component Value Range   WBC 14.9 (*) 4.0 - 10.5 K/uL   RBC 4.97  3.87 - 5.11 MIL/uL   Hemoglobin 13.4  12.0 - 15.0 g/dL   HCT 16.1  09.6 - 04.5 %   MCV 83.1  78.0 - 100.0 fL   MCH 27.0  26.0 - 34.0 pg    MCHC 32.4  30.0 - 36.0 g/dL   RDW 40.9  81.1 - 91.4 %   Platelets 271  150 - 400 K/uL   Neutrophils Relative 90 (*) 43 - 77 %  Neutro Abs 13.4 (*) 1.7 - 7.7 K/uL   Lymphocytes Relative 8 (*) 12 - 46 %   Lymphs Abs 1.3  0.7 - 4.0 K/uL   Monocytes Relative 2 (*) 3 - 12 %   Monocytes Absolute 0.2  0.1 - 1.0 K/uL   Eosinophils Relative 0  0 - 5 %   Eosinophils Absolute 0.1  0.0 - 0.7 K/uL   Basophils Relative 0  0 - 1 %   Basophils Absolute 0.0  0.0 - 0.1 K/uL  COMPREHENSIVE METABOLIC PANEL      Component Value Range   Sodium 133 (*) 135 - 145 mEq/L   Potassium 4.5  3.5 - 5.1 mEq/L   Chloride 95 (*) 96 - 112 mEq/L   CO2 27  19 - 32 mEq/L   Glucose, Bld 245 (*) 70 - 99 mg/dL   BUN 17  6 - 23 mg/dL   Creatinine, Ser 1.61  0.50 - 1.10 mg/dL   Calcium 09.6  8.4 - 04.5 mg/dL   Total Protein 7.8  6.0 - 8.3 g/dL   Albumin 4.3  3.5 - 5.2 g/dL   AST 76 (*) 0 - 37 U/L   ALT 108 (*) 0 - 35 U/L   Alkaline Phosphatase 87  39 - 117 U/L   Total Bilirubin 0.5  0.3 - 1.2 mg/dL   GFR calc non Af Amer >90  >90 mL/min   GFR calc Af Amer >90  >90 mL/min   DIAGNOSTIC STUDIES: Oxygen Saturation is 89% on room air, low by my interpretation.     Date: 06/29/2012  2300  Rate: 109  Rhythm: sinus tachycardia  QRS Axis: normal  Intervals: normal  ST/T Wave abnormalities: normal  Conduction Disutrbances: none  Narrative Interpretation: unremarkable Dg Chest Port 1 View  06/30/2012  *RADIOLOGY REPORT*  Clinical Data: Shortness of breath.  PORTABLE CHEST - 1 VIEW  Comparison: 09/13/2011  Findings: Shallow inspiration. The heart size and pulmonary vascularity are normal. The lungs appear clear and expanded without focal air space disease or consolidation. No blunting of the costophrenic angles.  No pneumothorax.  Mediastinal contours appear intact.  Asymmetric density over the left chest is likely due to soft tissue attenuation.  No significant change since previous study.  IMPRESSION: No evidence of active  pulmonary disease.   Original Report Authenticated By: Marlon Pel, M.D.       COORDINATION OF CARE:  23:40- Discussed planned course of treatment with the patient, who is agreeable at this time. 0045  Continued wheezing. Better air movement. Continued tachypnea. O2 sats 97% on O2 4 L 0150 Continued increased respiratory effort. Able to speak in partial sentences. )2 sat 93% on O2 0250 Continued wheezing. Continued improvement in air movement. O2 sats 93% on O2. No real improvement. Will administer additional nebulizer treatment and get blood gas. Labs ordered. Anticipate admission. 0430 ABG poor. Leukocytosis with normal chest xray.  4098 Completed additional nebulizer treatment. Continued shortness of breath. O2 sats initally 97% now 93% on O2. 0719 Second dose of solumedrol given. Will speak with hospitalist re admission. 7:38 AM:  T/C to Dr. Sherrie Mustache, hospitalist, case discussed, including:  HPI, pertinent PM/SHx, VS/PE, dx testing, ED course and treatment.  Agreeable to admission.  Requests to write temporary orders, step down bed to team 1.       MDM  Patient presents with increasing respiratory distress over a week with significant wheezing that developed in the last 24 hours. Despite multiple nebulizer treatments, very  slow progress in resolution of respiratory distress. She has remained tachycardic with tachypnea. Spoke with Dr. Sherrie Mustache, hospitalist, who will admit the patient. Pt stable in ED with no significant deterioration in condition.The patient appears reasonably stabilized for admission considering the current resources, flow, and capabilities available in the ED at this time, and I doubt any other [facility] requiring further screening and/or treatment in the ED prior to admission.  MDM Reviewed: nursing note and vitals Interpretation: labs and x-ray  CRITICAL CARE Performed by: Annamarie Dawley Total critical care time: 55 Critical care time was exclusive of separately  billable procedures and treating other patients. Critical care was necessary to treat or prevent imminent or life-threatening deterioration. Critical care was time spent personally by me on the following activities: development of treatment plan with patient and/or surrogate as well as nursing, discussions with consultants, evaluation of patient's response to treatment, examination of patient, obtaining history from patient or surrogate, ordering and performing treatments and interventions, ordering and review of laboratory studies, ordering and review of radiographic studies, pulse oximetry and re-evaluation of patient's condition.  I personally performed the services described in this documentation, which was scribed in my presence. The recorded information has been reviewed and considered.        Nicoletta Dress. Colon Branch, MD 06/30/12 [phone]

## 2012-06-29 NOTE — ED Notes (Addendum)
Pt presents with SOB. Pt states she was sick for a week and that it got worse. Notes a productive cough and yellow sputum. Ins and exp wheezing noted. Pt in obvious resp distress. Respiratory in room at this time giving albuterol breathing treatment. 02 sats 93% on 2L nasal cannula. Pt tachycardic 120s

## 2012-06-29 NOTE — ED Notes (Signed)
Patient complaining of shortness of breath. Patient reports being sick here recently with cold like symptoms, but sudden onset shortness of breath tonight. Patient with audible wheezing and dry cough.

## 2012-06-30 ENCOUNTER — Emergency Department (HOSPITAL_COMMUNITY): Payer: Medicaid Other

## 2012-06-30 ENCOUNTER — Encounter (HOSPITAL_COMMUNITY): Payer: Self-pay | Admitting: Internal Medicine

## 2012-06-30 DIAGNOSIS — J9602 Acute respiratory failure with hypercapnia: Secondary | ICD-10-CM | POA: Diagnosis present

## 2012-06-30 DIAGNOSIS — J9600 Acute respiratory failure, unspecified whether with hypoxia or hypercapnia: Principal | ICD-10-CM

## 2012-06-30 DIAGNOSIS — I10 Essential (primary) hypertension: Secondary | ICD-10-CM | POA: Diagnosis present

## 2012-06-30 DIAGNOSIS — J302 Other seasonal allergic rhinitis: Secondary | ICD-10-CM | POA: Diagnosis present

## 2012-06-30 DIAGNOSIS — J309 Allergic rhinitis, unspecified: Secondary | ICD-10-CM

## 2012-06-30 DIAGNOSIS — D72829 Elevated white blood cell count, unspecified: Secondary | ICD-10-CM | POA: Diagnosis present

## 2012-06-30 DIAGNOSIS — E871 Hypo-osmolality and hyponatremia: Secondary | ICD-10-CM | POA: Diagnosis present

## 2012-06-30 DIAGNOSIS — R000 Tachycardia, unspecified: Secondary | ICD-10-CM | POA: Diagnosis present

## 2012-06-30 DIAGNOSIS — J209 Acute bronchitis, unspecified: Secondary | ICD-10-CM | POA: Diagnosis present

## 2012-06-30 DIAGNOSIS — E119 Type 2 diabetes mellitus without complications: Secondary | ICD-10-CM

## 2012-06-30 DIAGNOSIS — E039 Hypothyroidism, unspecified: Secondary | ICD-10-CM | POA: Diagnosis present

## 2012-06-30 LAB — CBC WITH DIFFERENTIAL/PLATELET
Basophils Absolute: 0 10*3/uL (ref 0.0–0.1)
Lymphocytes Relative: 8 % — ABNORMAL LOW (ref 12–46)
Lymphs Abs: 1.3 10*3/uL (ref 0.7–4.0)
Neutro Abs: 13.4 10*3/uL — ABNORMAL HIGH (ref 1.7–7.7)
Neutrophils Relative %: 90 % — ABNORMAL HIGH (ref 43–77)
Platelets: 271 10*3/uL (ref 150–400)
RBC: 4.97 MIL/uL (ref 3.87–5.11)
RDW: 12.8 % (ref 11.5–15.5)
WBC: 14.9 10*3/uL — ABNORMAL HIGH (ref 4.0–10.5)

## 2012-06-30 LAB — COMPREHENSIVE METABOLIC PANEL
ALT: 108 U/L — ABNORMAL HIGH (ref 0–35)
AST: 76 U/L — ABNORMAL HIGH (ref 0–37)
Alkaline Phosphatase: 87 U/L (ref 39–117)
CO2: 27 mEq/L (ref 19–32)
Calcium: 10 mg/dL (ref 8.4–10.5)
Chloride: 100 mEq/L (ref 96–112)
GFR calc Af Amer: >90 mL/min (ref >90)
GFR calc non Af Amer: >90 mL/min (ref >90)
Glucose, Bld: 245 mg/dL — ABNORMAL HIGH (ref 70–99)
Potassium: 4.7 mEq/L (ref 3.5–5.1)
Sodium: 141 mEq/L (ref 135–145)
Total Bilirubin: 0.5 mg/dL (ref 0.3–1.2)

## 2012-06-30 LAB — BLOOD GAS, ARTERIAL
Acid-base deficit: 3.2 mmol/L — ABNORMAL HIGH (ref 0.0–2.0)
O2 Content: 2 L/min
TCO2: 20.6 mmol/L (ref 0–100)
pCO2 arterial: 49.6 mmHg — ABNORMAL HIGH (ref 35.0–45.0)
pH, Arterial: 7.28 — ABNORMAL LOW (ref 7.350–7.450)
pO2, Arterial: 79.3 mmHg — ABNORMAL LOW (ref 80.0–100.0)

## 2012-06-30 LAB — TSH: TSH: 3.189 u[IU]/mL (ref 0.350–4.500)

## 2012-06-30 LAB — GLUCOSE, CAPILLARY
Glucose-Capillary: 260 mg/dL — ABNORMAL HIGH (ref 70–99)
Glucose-Capillary: 191 mg/dL — ABNORMAL HIGH (ref 70–99)
Glucose-Capillary: 187 mg/dL — ABNORMAL HIGH (ref 70–99)

## 2012-06-30 MED ORDER — SIMVASTATIN 10 MG PO TABS: 10.0000 mg | ORAL_TABLET | Freq: Every day | ORAL | Status: DC

## 2012-06-30 MED ORDER — HYDROCODONE-ACETAMINOPHEN 5-325 MG PO TABS
1.0000 | ORAL_TABLET | ORAL | Status: DC | PRN
  Administered 2012-06-30 (×2): 1 via ORAL
  Administered 2012-07-01 (×2): 2 via ORAL
  Administered 2012-07-02: 1 via ORAL
  Administered 2012-07-02 (×3): 2 via ORAL
  Filled 2012-06-30: qty 2
  Filled 2012-06-30: qty 1
  Filled 2012-06-30: qty 2
  Filled 2012-06-30: qty 1
  Filled 2012-06-30 (×4): qty 2

## 2012-06-30 MED ORDER — INSULIN GLARGINE 100 UNIT/ML ~~LOC~~ SOLN
20.0000 [IU] | Freq: Two times a day (BID) | SUBCUTANEOUS | Status: DC
  Administered 2012-06-30 (×2): 20 [IU] via SUBCUTANEOUS

## 2012-06-30 MED ORDER — ALBUTEROL (5 MG/ML) CONTINUOUS INHALATION SOLN: 10.0000 mg/h | INHALATION_SOLUTION | RESPIRATORY_TRACT | Status: AC

## 2012-06-30 MED ORDER — METHYLPREDNISOLONE SODIUM SUCC 125 MG IJ SOLR
125.0000 mg | Freq: Once | INTRAMUSCULAR | Status: AC
  Administered 2012-06-30: 125 mg via INTRAVENOUS
  Filled 2012-06-30: qty 2

## 2012-06-30 MED ORDER — ALBUTEROL SULFATE (5 MG/ML) 0.5% IN NEBU
2.5000 mg | INHALATION_SOLUTION | Freq: Once | RESPIRATORY_TRACT | Status: AC
  Administered 2012-06-30: 2.5 mg via RESPIRATORY_TRACT

## 2012-06-30 MED ORDER — ONDANSETRON HCL 4 MG/2ML IJ SOLN
4.0000 mg | Freq: Once | INTRAMUSCULAR | Status: AC
  Administered 2012-06-30: 4 mg via INTRAVENOUS
  Filled 2012-06-30: qty 2

## 2012-06-30 MED ORDER — METHYLPREDNISOLONE SODIUM SUCC 125 MG IJ SOLR
80.0000 mg | Freq: Four times a day (QID) | INTRAMUSCULAR | Status: DC
  Administered 2012-06-30 – 2012-07-01 (×3): 80 mg via INTRAVENOUS
  Filled 2012-06-30 (×4): qty 2

## 2012-06-30 MED ORDER — IPRATROPIUM BROMIDE 0.02 % IN SOLN
0.5000 mg | Freq: Once | RESPIRATORY_TRACT | Status: AC
  Administered 2012-06-30: 0.5 mg via RESPIRATORY_TRACT

## 2012-06-30 MED ORDER — LORAZEPAM 1 MG PO TABS
1.0000 mg | ORAL_TABLET | Freq: Every evening | ORAL | Status: DC | PRN
  Administered 2012-07-01: 1 mg via ORAL
  Filled 2012-06-30: qty 1

## 2012-06-30 MED ORDER — ENOXAPARIN SODIUM 40 MG/0.4ML ~~LOC~~ SOLN
40.0000 mg | SUBCUTANEOUS | Status: DC
  Administered 2012-06-30 – 2012-07-03 (×4): 40 mg via SUBCUTANEOUS
  Filled 2012-06-30 (×4): qty 0.4

## 2012-06-30 MED ORDER — FLUTICASONE-SALMETEROL 250-50 MCG/DOSE IN AEPB
1.0000 | INHALATION_SPRAY | Freq: Two times a day (BID) | RESPIRATORY_TRACT | Status: DC
  Administered 2012-06-30 – 2012-07-03 (×6): 1 via RESPIRATORY_TRACT
  Filled 2012-06-30: qty 14

## 2012-06-30 MED ORDER — ACETAMINOPHEN 650 MG RE SUPP: 650.0000 mg | Freq: Four times a day (QID) | RECTAL | Status: DC | PRN

## 2012-06-30 MED ORDER — INSULIN ASPART 100 UNIT/ML ~~LOC~~ SOLN
0.0000 [IU] | Freq: Three times a day (TID) | SUBCUTANEOUS | Status: DC
  Administered 2012-06-30: 4 [IU] via SUBCUTANEOUS
  Administered 2012-06-30: 3 [IU] via SUBCUTANEOUS
  Administered 2012-07-01: 11 [IU] via SUBCUTANEOUS
  Administered 2012-07-01: 4 [IU] via SUBCUTANEOUS
  Administered 2012-07-02 (×2): 3 [IU] via SUBCUTANEOUS
  Administered 2012-07-03: 4 [IU] via SUBCUTANEOUS

## 2012-06-30 MED ORDER — MAGNESIUM SULFATE 40 MG/ML IJ SOLN
2.0000 g | Freq: Once | INTRAMUSCULAR | Status: AC
  Administered 2012-06-30: 2 g via INTRAVENOUS
  Filled 2012-06-30: qty 50

## 2012-06-30 MED ORDER — DOCUSATE SODIUM 100 MG PO CAPS
100.0000 mg | ORAL_CAPSULE | Freq: Two times a day (BID) | ORAL | Status: DC
  Administered 2012-07-01 – 2012-07-03 (×4): 100 mg via ORAL
  Filled 2012-06-30 (×5): qty 1

## 2012-06-30 MED ORDER — GUAIFENESIN ER 600 MG PO TB12
600.0000 mg | ORAL_TABLET | Freq: Two times a day (BID) | ORAL | Status: DC
  Administered 2012-06-30 – 2012-07-03 (×7): 600 mg via ORAL
  Filled 2012-06-30 (×7): qty 1

## 2012-06-30 MED ORDER — METFORMIN HCL ER 500 MG PO TB24
1000.0000 mg | ORAL_TABLET | Freq: Two times a day (BID) | ORAL | Status: DC
  Administered 2012-06-30 – 2012-07-03 (×7): 1000 mg via ORAL
  Filled 2012-06-30 (×10): qty 2

## 2012-06-30 MED ORDER — SODIUM CHLORIDE 0.9 % IV SOLN: INTRAVENOUS | Status: DC

## 2012-06-30 MED ORDER — INSULIN ASPART 100 UNIT/ML ~~LOC~~ SOLN
3.0000 [IU] | Freq: Three times a day (TID) | SUBCUTANEOUS | Status: DC
  Administered 2012-06-30 – 2012-07-01 (×3): 3 [IU] via SUBCUTANEOUS

## 2012-06-30 MED ORDER — ALBUTEROL SULFATE (5 MG/ML) 0.5% IN NEBU
5.0000 mg | INHALATION_SOLUTION | Freq: Once | RESPIRATORY_TRACT | Status: AC
  Administered 2012-06-30: 5 mg via RESPIRATORY_TRACT
  Filled 2012-06-30: qty 1

## 2012-06-30 MED ORDER — FAMOTIDINE 20 MG PO TABS
20.0000 mg | ORAL_TABLET | Freq: Two times a day (BID) | ORAL | Status: DC
  Administered 2012-07-01 – 2012-07-03 (×5): 20 mg via ORAL
  Filled 2012-06-30 (×6): qty 1

## 2012-06-30 MED ORDER — ALUM & MAG HYDROXIDE-SIMETH 200-200-20 MG/5ML PO SUSP: 30.0000 mL | Freq: Four times a day (QID) | ORAL | Status: DC | PRN

## 2012-06-30 MED ORDER — LEVOFLOXACIN IN D5W 500 MG/100ML IV SOLN
500.0000 mg | INTRAVENOUS | Status: DC
  Administered 2012-06-30 – 2012-07-03 (×4): 500 mg via INTRAVENOUS
  Filled 2012-06-30 (×6): qty 100

## 2012-06-30 MED ORDER — FLUTICASONE PROPIONATE 50 MCG/ACT NA SUSP
1.0000 | Freq: Every day | NASAL | Status: DC
  Administered 2012-06-30 – 2012-07-03 (×4): 1 via NASAL
  Filled 2012-06-30: qty 16

## 2012-06-30 MED ORDER — ACETAMINOPHEN 325 MG PO TABS: 650.0000 mg | ORAL_TABLET | Freq: Four times a day (QID) | ORAL | Status: DC | PRN

## 2012-06-30 MED ORDER — ALBUTEROL SULFATE (5 MG/ML) 0.5% IN NEBU
2.5000 mg | INHALATION_SOLUTION | RESPIRATORY_TRACT | Status: DC
  Administered 2012-06-30 – 2012-07-01 (×6): 2.5 mg via RESPIRATORY_TRACT
  Filled 2012-06-30 (×7): qty 0.5

## 2012-06-30 MED ORDER — GUAIFENESIN-DM 100-10 MG/5ML PO SYRP
5.0000 mL | ORAL_SOLUTION | ORAL | Status: DC | PRN
  Administered 2012-06-30 – 2012-07-02 (×5): 5 mL via ORAL
  Filled 2012-06-30 (×5): qty 5

## 2012-06-30 MED ORDER — PANTOPRAZOLE SODIUM 40 MG IV SOLR
40.0000 mg | Freq: Once | INTRAVENOUS | Status: AC
  Administered 2012-06-30: 40 mg via INTRAVENOUS
  Filled 2012-06-30: qty 40

## 2012-06-30 MED ORDER — LEVOTHYROXINE SODIUM 75 MCG PO TABS
175.0000 ug | ORAL_TABLET | Freq: Every day | ORAL | Status: DC
  Administered 2012-07-01 – 2012-07-03 (×3): 175 ug via ORAL
  Filled 2012-06-30 (×5): qty 1

## 2012-06-30 MED ORDER — BENZONATATE 100 MG PO CAPS
100.0000 mg | ORAL_CAPSULE | Freq: Three times a day (TID) | ORAL | Status: DC
  Administered 2012-06-30 – 2012-07-03 (×11): 100 mg via ORAL
  Filled 2012-06-30 (×11): qty 1

## 2012-06-30 MED ORDER — LORATADINE 10 MG PO TABS
10.0000 mg | ORAL_TABLET | Freq: Every day | ORAL | Status: DC
  Administered 2012-06-30 – 2012-07-03 (×4): 10 mg via ORAL
  Filled 2012-06-30 (×4): qty 1

## 2012-06-30 MED ORDER — ALPRAZOLAM 0.5 MG PO TABS
0.5000 mg | ORAL_TABLET | Freq: Four times a day (QID) | ORAL | Status: DC | PRN
  Administered 2012-06-30 – 2012-07-02 (×2): 0.5 mg via ORAL
  Filled 2012-06-30 (×2): qty 1

## 2012-06-30 MED ORDER — ALBUTEROL SULFATE (5 MG/ML) 0.5% IN NEBU: 2.5000 mg | INHALATION_SOLUTION | RESPIRATORY_TRACT | Status: DC | PRN

## 2012-06-30 MED ORDER — ALBUTEROL SULFATE (5 MG/ML) 0.5% IN NEBU
2.5000 mg | INHALATION_SOLUTION | RESPIRATORY_TRACT | Status: DC | PRN
  Administered 2012-06-30: 2.5 mg via RESPIRATORY_TRACT

## 2012-06-30 MED ORDER — ONDANSETRON HCL 4 MG PO TABS: 4.0000 mg | ORAL_TABLET | Freq: Four times a day (QID) | ORAL | Status: DC | PRN

## 2012-06-30 MED ORDER — IPRATROPIUM BROMIDE 0.02 % IN SOLN
1.0000 mg | Freq: Once | RESPIRATORY_TRACT | Status: DC
  Filled 2012-06-30 (×3): qty 2.5

## 2012-06-30 MED ORDER — ONDANSETRON HCL 4 MG/2ML IJ SOLN: 4.0000 mg | Freq: Four times a day (QID) | INTRAMUSCULAR | Status: DC | PRN

## 2012-06-30 MED ORDER — POTASSIUM CHLORIDE IN NACL 20-0.9 MEQ/L-% IV SOLN
INTRAVENOUS | Status: DC
  Administered 2012-06-30 – 2012-07-02 (×6): via INTRAVENOUS

## 2012-06-30 MED ORDER — IPRATROPIUM BROMIDE 0.02 % IN SOLN
0.5000 mg | Freq: Once | RESPIRATORY_TRACT | Status: AC
  Administered 2012-06-30: 0.5 mg via RESPIRATORY_TRACT
  Filled 2012-06-30: qty 2.5

## 2012-06-30 MED ORDER — MORPHINE SULFATE 4 MG/ML IJ SOLN
2.0000 mg | Freq: Once | INTRAMUSCULAR | Status: AC
  Administered 2012-06-30: 2 mg via INTRAVENOUS
  Filled 2012-06-30: qty 1

## 2012-06-30 MED ORDER — IPRATROPIUM BROMIDE 0.02 % IN SOLN
0.5000 mg | RESPIRATORY_TRACT | Status: DC
  Administered 2012-06-30 – 2012-07-01 (×7): 0.5 mg via RESPIRATORY_TRACT
  Filled 2012-06-30 (×5): qty 2.5

## 2012-06-30 MED ORDER — CITALOPRAM HYDROBROMIDE 20 MG PO TABS
40.0000 mg | ORAL_TABLET | Freq: Every day | ORAL | Status: DC
  Administered 2012-06-30 – 2012-07-03 (×4): 40 mg via ORAL
  Filled 2012-06-30 (×4): qty 2

## 2012-06-30 MED ORDER — MAGNESIUM SULFATE 50 % IJ SOLN: 2.0000 g | Freq: Once | INTRAVENOUS | Status: DC

## 2012-06-30 MED ORDER — INSULIN ASPART 100 UNIT/ML ~~LOC~~ SOLN
0.0000 [IU] | Freq: Every day | SUBCUTANEOUS | Status: DC
  Administered 2012-07-01: 2 [IU] via SUBCUTANEOUS

## 2012-06-30 NOTE — ED Notes (Signed)
Ins and exp wheezing still noted to both lungs, however it sounds improved over previous exam when patient first came in.

## 2012-06-30 NOTE — ED Notes (Signed)
Patient provided peanut butter crackers and diet coke per request.

## 2012-06-30 NOTE — ED Notes (Signed)
Report given to Marie, RN ICU. Ready to receive patient. 

## 2012-06-30 NOTE — Progress Notes (Signed)
Pt coughs continuous with neb

## 2012-06-30 NOTE — H&P (Signed)
[facility] History and Physical  Sarah Pope WUJ:[id number] DOB: 06/03/1956 DOA: 06/29/2012  Referring physician: Greta Doom, M.D. PCP: [name], MD   Chief Complaint: Shortness of breath  HPI: Sarah Pope is a 56 y.o. female with a history significant for seasonal allergies, bronchitis, and hypertension, who presents with a chief complaint of shortness of breath. Her symptoms started approximately one week ago with a nonproductive cough, nasal congestion, and subsequent chest congestion. She took over-the-counter NyQuil, increased her albuterol inhaler usage, and continued her Nasonex nasal sprays. Her symptoms did not improve. Subsequently, she developed more wheezing, chest pain when coughing, and subjective fever and chills. She also has had nausea but no vomiting. She has abdominal soreness and lower back pain when coughing. Her cough has been nonproductive. Currently, she is less short of breath after being given the albuterol and Atrovent nebulizations. She does complain of jitteriness.   In the emergency department, she is tachycardic. She is afebrile. Her oxygen saturation was initially 89% on room air. Her ABG on 2 L of oxygen reveals a pH of 7.28, PCO2 of 49.6, and PO2 of 79.3. Her chest x-ray reveals no acute cardiopulmonary abnormalities. Her white blood cell count is 14.9. Her venous glucose is 245. Her AST is 76 and her ALT is 108. She is being admitted for further evaluation and management.  Review of Systems: As above in history present illness, otherwise negative.  Past Medical History  Diagnosis Date  . Seasonal allergies   . Depression   . Diabetes mellitus   . GERD (gastroesophageal reflux disease)   . Hyperlipidemia   . Hypertension   . Hypothyroidism   . FATTY LIVER DISEASE 08/05/2008  . PERSONAL HX COLONIC POLYPS 09/01/2008  . TRANSAMINASES, SERUM, ELEVATED 08/05/2008  . Obesity, Class III, BMI 40-49.9 (morbid obesity)    Past Surgical History    Procedure Date  . Mandible surgery 1988    realignment  . Nose surgery 2007    sinus infections   Social History: The patient is single. She lives in [place]. She has 2 children. She is employed at [employer]. She stopped smoking approximately 20 years ago. She denies illicit drug use or alcohol use.   Allergies  Allergen Reactions  . Codeine     REACTION: N/V    Family History  Problem Relation Age of Onset  . Colon cancer Neg Hx   . Esophageal cancer Neg Hx   . Rectal cancer Neg Hx   . Stomach cancer Neg Hx     Her mother died of a heart attack, but had a history of breast cancer. Her father died of metastatic prostate cancer.  Prior to Admission medications   Medication Sig Start Date End Date Taking? Authorizing Provider  cetirizine (ZYRTEC) 10 MG chewable tablet Chew 10 mg by mouth daily.    Historical Provider, MD  citalopram (CELEXA) 40 MG tablet Take 40 mg by mouth daily.    Historical Provider, MD  insulin glargine (LANTUS) 100 UNIT/ML injection Inject 30 Units into the skin at bedtime.    Historical Provider, MD  insulin lispro (HUMALOG) 100 UNIT/ML injection Inject into the skin 3 (three) times daily before meals.    Historical Provider, MD  levothyroxine (SYNTHROID, LEVOTHROID) 175 MCG tablet Take 175 mcg by mouth daily.    Historical Provider, MD  lisinopril-hydrochlorothiazide (PRINZIDE,ZESTORETIC) 10-12.5 MG per tablet Take 1 tablet by mouth daily.    Historical Provider, MD  metFORMIN (GLUMETZA) 1000 MG (MOD) 24  hr tablet Take 1,000 mg by mouth 2 (two) times daily with a meal.    Historical Provider, MD  oxazepam (SERAX) 10 MG capsule Take 10 mg by mouth at bedtime as needed.    Historical Provider, MD  peg 3350 powder (MOVIPREP) SOLR Take 1 kit (100 g total) by mouth once. 01/24/12   Meryl Dare, MD,FACG  pravastatin (PRAVACHOL) 20 MG tablet Take 20 mg by mouth daily.    Historical Provider, MD   Physical Exam: Filed Vitals:   06/30/12 0500 06/30/12 0600  06/30/12 0630 06/30/12 0721  BP: 144/74 177/84    Pulse: 121  112   Temp:      TempSrc:      Resp:   21   Height:      Weight:      SpO2: 94% 93%  96%     General:  Obese 56 year old Caucasian woman lying in bed, in no acute respiratory distress. She is slightly tremulous.  Eyes: Pupils are equal, round, and reactive to light. Extraocular movements are intact. Conjunctivae are clear. Sclerae are white.  ENT: Tympanic membranes are clear bilaterally. Nasal mucosa is dry. Oropharynx reveals mildly dry mucous membranes. No posterior exudates or erythema.  Neck: Supple, no adenopathy, no thyromegaly.  Cardiovascular: S1, S2, with mild tachycardia.  Respiratory: Mild diffuse expiratory wheezes. Breathing is mildly labored when speaking.  Abdomen: Obese, positive bowel sounds, soft, nontender, nondistended.  Skin: Good turgor. She varicosities noted on her lower extremities bilaterally.  Musculoskeletal: No acute hot red joints. Pedal pulses palpable. No pedal edema.  Psychiatric: Mildly tremulous. Alert and oriented x3. Flat affect. Otherwise cooperative.  Neurologic: Cranial nerves II through XII intact. Strength is 5 over 5 throughout. Sensation is intact.  Labs on Admission:  Basic Metabolic Panel:  Lab 06/30/12 1610  NA 141  K 4.7  CL 100  CO2 27  GLUCOSE 245*  BUN 17  CREATININE 0.63  CALCIUM 10.0  MG --  PHOS --   Liver Function Tests:  Lab 06/30/12 0411  AST 76*  ALT 108*  ALKPHOS 87  BILITOT 0.5  PROT 7.8  ALBUMIN 4.3   No results found for this basename: LIPASE:5,AMYLASE:5 in the last 168 hours No results found for this basename: AMMONIA:5 in the last 168 hours CBC:  Lab 06/30/12 0411  WBC 14.9*  NEUTROABS 13.4*  HGB 13.4  HCT 41.3  MCV 83.1  PLT 271   Cardiac Enzymes: No results found for this basename: CKTOTAL:5,CKMB:5,CKMBINDEX:5,TROPONINI:5 in the last 168 hours  BNP (last 3 results) No results found for this basename: PROBNP:3 in the  last 8760 hours CBG:  Lab 06/30/12 0800  GLUCAP 238*    Radiological Exams on Admission: Dg Chest Port 1 View  06/30/2012  *RADIOLOGY REPORT*  Clinical Data: Shortness of breath.  PORTABLE CHEST - 1 VIEW  Comparison: 09/13/2011  Findings: Shallow inspiration. The heart size and pulmonary vascularity are normal. The lungs appear clear and expanded without focal air space disease or consolidation. No blunting of the costophrenic angles.  No pneumothorax.  Mediastinal contours appear intact.  Asymmetric density over the left chest is likely due to soft tissue attenuation.  No significant change since previous study.  IMPRESSION: No evidence of active pulmonary disease.   Original Report Authenticated By: Marlon Pel, M.D.     EKG: Sinus tachycardia with a heart rate of 109 beats per minute.  Assessment/Plan Principal Problem:  *Acute respiratory failure with hypercapnia Active Problems:  Acute bronchitis  Seasonal allergies  Sinus tachycardia  DM type 2 (diabetes mellitus, type 2)  Hyponatremia  Leukocytosis  HTN (hypertension)  Hypothyroidism    This is a 56 year old with a remote history of tobacco abuse and history of bronchitis, who presents with acute respiratory failure with mild hypercapnia/respiratory acidosis/and mild hypoxia, secondary to acute bronchitis. She received one hour of albuterol nebulization and Solu-Medrol, but she is still quite symptomatic. She has a history of seasonal allergies which may have prompted her symptoms. Her tachycardia is likely secondary to respiratory distress and secondary to bronchodilator therapy. She has a history of elevated liver enzymes secondary to fatty liver. Statin therapy may also be contributing.       Plan:  1. The patient was given a total of 250 mg of IV Solu-Medrol in the emergency department. She was also given albuterol and Atrovent nebulizations, and Protonix. 2. We'll continue steroid therapy with Solu-Medrol and  bronchodilator therapy with albuterol and Atrovent nebulizers. We'll add Advair inhaler. We'll titrate her oxygen to keep her oxygen saturations at at least at 92%. 3. We'll start Levaquin IV empirically. 4. Symptomatic treatment with Tessalon Perles, Mucinex, and as needed Robitussin. We'll also add when necessary Xanax for medication induced she was noticed. 5. IV fluid hydration. 6. Continue antihistamine. We'll add Pepcid as well. 7. We'll continue metformin and Lantus. We'll add sliding scale NovoLog in anticipation that her capillary blood glucose will become more elevated on steroids. 8. For further evaluation, we'll check a TSH, hemoglobin A1c, and acute viral hepatitis panel. 9. The patient will be monitored in the step down unit.   Code Status: full Family Communication:  Disposition Plan: Home when clinically improved.  Time spent: One hour 20 minutes.  Brien Lowe [facility] Pager   If 7PM-7AM, please contact night-coverage [email or web address] Password [email or web address] 06/30/2012, 8:29 AM

## 2012-06-30 NOTE — ED Notes (Signed)
Attempted to call report. Pam, RN to call back.

## 2012-06-30 NOTE — ED Notes (Signed)
Pt states some relief from medications given by respiratory. O2 sats 97% at this time, breaths 22 min

## 2012-06-30 NOTE — Progress Notes (Signed)
Pt looks better still decreased with exp wheezes, she is on 4 lpm/[place] sat 93  , Has history of previous asthma attack.

## 2012-07-01 DIAGNOSIS — R0609 Other forms of dyspnea: Secondary | ICD-10-CM

## 2012-07-01 LAB — BASIC METABOLIC PANEL
CO2: 24 mEq/L (ref 19–32)
Chloride: 102 mEq/L (ref 96–112)
GFR calc Af Amer: >90 mL/min (ref >90)
Potassium: 4.4 mEq/L (ref 3.5–5.1)
Sodium: 137 mEq/L (ref 135–145)

## 2012-07-01 LAB — HEPATIC FUNCTION PANEL
AST: 45 U/L — ABNORMAL HIGH (ref 0–37)
Albumin: 3.5 g/dL (ref 3.5–5.2)
Total Bilirubin: 0.7 mg/dL (ref 0.3–1.2)

## 2012-07-01 LAB — CBC
HCT: 37.1 % (ref 36.0–46.0)
Hemoglobin: 12.3 g/dL (ref 12.0–15.0)
MCV: 83.2 fL (ref 78.0–100.0)
RBC: 4.46 MIL/uL (ref 3.87–5.11)
WBC: 15.9 10*3/uL — ABNORMAL HIGH (ref 4.0–10.5)

## 2012-07-01 LAB — GLUCOSE, CAPILLARY: Glucose-Capillary: 255 mg/dL — ABNORMAL HIGH (ref 70–99)

## 2012-07-01 MED ORDER — IPRATROPIUM BROMIDE 0.02 % IN SOLN
0.5000 mg | Freq: Four times a day (QID) | RESPIRATORY_TRACT | Status: DC
  Administered 2012-07-01: 0.5 mg via RESPIRATORY_TRACT
  Filled 2012-07-01: qty 2.5

## 2012-07-01 MED ORDER — INSULIN ASPART 100 UNIT/ML ~~LOC~~ SOLN
5.0000 [IU] | Freq: Three times a day (TID) | SUBCUTANEOUS | Status: DC
  Administered 2012-07-01 – 2012-07-03 (×8): 5 [IU] via SUBCUTANEOUS

## 2012-07-01 MED ORDER — METHYLPREDNISOLONE SODIUM SUCC 125 MG IJ SOLR
80.0000 mg | Freq: Two times a day (BID) | INTRAMUSCULAR | Status: DC
  Administered 2012-07-01 – 2012-07-02 (×2): 80 mg via INTRAVENOUS
  Filled 2012-07-01 (×3): qty 2

## 2012-07-01 MED ORDER — ALBUTEROL SULFATE (5 MG/ML) 0.5% IN NEBU
2.5000 mg | INHALATION_SOLUTION | Freq: Two times a day (BID) | RESPIRATORY_TRACT | Status: DC
  Administered 2012-07-01 – 2012-07-03 (×4): 2.5 mg via RESPIRATORY_TRACT
  Filled 2012-07-01 (×4): qty 0.5

## 2012-07-01 MED ORDER — INSULIN GLARGINE 100 UNIT/ML ~~LOC~~ SOLN
25.0000 [IU] | Freq: Two times a day (BID) | SUBCUTANEOUS | Status: DC
  Administered 2012-07-01 – 2012-07-03 (×5): 25 [IU] via SUBCUTANEOUS

## 2012-07-01 MED ORDER — IPRATROPIUM BROMIDE 0.02 % IN SOLN
0.5000 mg | Freq: Two times a day (BID) | RESPIRATORY_TRACT | Status: DC
  Administered 2012-07-01 – 2012-07-03 (×4): 0.5 mg via RESPIRATORY_TRACT
  Filled 2012-07-01 (×4): qty 2.5

## 2012-07-01 MED ORDER — ALBUTEROL SULFATE (5 MG/ML) 0.5% IN NEBU
2.5000 mg | INHALATION_SOLUTION | Freq: Four times a day (QID) | RESPIRATORY_TRACT | Status: DC
  Administered 2012-07-01: 2.5 mg via RESPIRATORY_TRACT
  Filled 2012-07-01: qty 0.5

## 2012-07-01 NOTE — Progress Notes (Addendum)
[facility] PROGRESS NOTE  Sarah Pope ZOX:[id number] DOB: [birth year] DOA: 06/29/2012 PCP: [name], MD  Brief narrative: Sarah Pope is a 56 year old woman with a PMH of bronchitis and a remote history of tobacco abuse who was admitted to the hospital on 06/30/12 with acute hypercapnic and hypoxic respiratory failure.    Assessment/Plan: Principal Problem:  *Acute respiratory failure with hypercapnia  The patient was admitted to the ICU and given empiric IV Solumedrol, IV magnesium sulfate, nebulized bronchodilators, Advair, Levaquin, anti-tussives, anti-histamines and supplemental oxygen.  Improved overnight, transfer to floor.  Continue current treatment.  Wean Solumedrol, as tolerated (Change from 80 mg Q 6 to Q 12 today). Active Problems:  Acute bronchitis  On emipiric Levaquin.  DM type 2 (diabetes mellitus, type 2)  CBG 187-260.  Increase Lantus, continue insulin resistant SSI, increase meal coverage to 5 units.  Continue Metformin.  Diabetes coordinator recommends assessing outpatient control, will order hemoglobin A1c (patient is uninsured and likely does not have regular F/U with a PCP for DM management).  Leukocytosis  Likely due to acute bronchitis.  Continue empiric antibiotics.  HTN (hypertension)  Lisinopril/HCTZ currently on hold.  BP stable.  Seasonal allergies  Continue Zyrtec.  Hypothyroidism  Continue Synthroid.  TSH WNL.  Sinus tachycardia  Likely related to respiratory distress.  Elevated LFTs  F/U acute hepatitis panel.   Code Status: Full Family Communication: None at bedside. Disposition Plan: Home when stable.   Medical Consultants:  None.  Other Consultants:  Diabetes Coordinator: Check hemoglobin A1c.  Procedures:  None.  Antibiotics:  Levaquin 07/01/12--->  HPI/Subjective: Sarah Pope feels a bit better.  She still has dyspnea and paroxysms of a dry, irritated cough.  No fever or chills overnight.  No chest  pain.  Objective: Filed Vitals:   07/01/12 0700 07/01/12 0714 07/01/12 0749 07/01/12 0800  BP: 127/61   121/72  Pulse: 106   128  Temp:   97.9 F (36.6 C)   TempSrc:   Oral   Resp: 18   25  Height:      Weight:      SpO2: 97% 95%  95%    Intake/Output Summary (Last 24 hours) at 07/01/12 0854 Last data filed at 07/01/12 0800  Gross per 24 hour  Intake   3250 ml  Output      0 ml  Net   3250 ml    Exam: Gen:  NAD Cardiovascular:  RRR, No M/R/G Respiratory: Lungs without wheeze, but prominent expiratory phase Gastrointestinal: Abdomen soft, NT/ND with normal active bowel sounds. Extremities: No C/E/C  Data Reviewed: Basic Metabolic Panel:  Lab 07/01/12 8119 06/30/12 0411  NA 137 141  K 4.4 4.7  CL 102 100  CO2 24 27  GLUCOSE 198* 245*  BUN 13 17  CREATININE 0.52 0.63  CALCIUM 8.8 10.0  MG -- --  PHOS -- --   GFR Estimated Creatinine Clearance: 95.3 ml/min (by C-G formula based on Cr of 0.52). Liver Function Tests:  Lab 07/01/12 0418 06/30/12 0411  AST 45* 76*  ALT 75* 108*  ALKPHOS 69 87  BILITOT 0.7 0.5  PROT 6.4 7.8  ALBUMIN 3.5 4.3    CBC:  Lab 07/01/12 0418 06/30/12 0411  WBC 15.9* 14.9*  NEUTROABS -- 13.4*  HGB 12.3 13.4  HCT 37.1 41.3  MCV 83.2 83.1  PLT 262 271   CBG:  Lab 07/01/12 0800 06/30/12 2132 06/30/12 1644 06/30/12 1127 06/30/12 0800  GLUCAP 255* 187* 191* 260*  238*   D-Dimer  Basename 06/30/12 0759  DDIMER 0.34   Thyroid function studies  Basename 06/30/12 0846  TSH 3.189  T4TOTAL --  T3FREE --  THYROIDAB --   Microbiology Recent Results (from the past 240 hour(s))  MRSA PCR SCREENING     Status: Normal   Collection Time   06/30/12  9:54 AM      Component Value Range Status Comment   MRSA by PCR NEGATIVE  NEGATIVE Final      Studies:  Dg Chest Port 1 View 06/30/2012 IMPRESSION: No evidence of active pulmonary disease.   Original Report Authenticated By: Marlon Pel, M.D.     Scheduled Meds:   .  albuterol  2.5 mg Nebulization Q4H  . benzonatate  100 mg Oral TID  . citalopram  40 mg Oral Daily  . docusate sodium  100 mg Oral BID  . enoxaparin (LOVENOX) injection  40 mg Subcutaneous Q24H  . famotidine  20 mg Oral BID  . fluticasone  1 spray Each Nare Daily  . Fluticasone-Salmeterol  1 puff Inhalation BID  . guaiFENesin  600 mg Oral BID  . insulin aspart  0-20 Units Subcutaneous TID WC  . insulin aspart  0-5 Units Subcutaneous QHS  . insulin aspart  3 Units Subcutaneous TID WC  . insulin glargine  20 Units Subcutaneous BID  . ipratropium  0.5 mg Nebulization Q4H  . ipratropium  1 mg Nebulization Once  . levofloxacin (LEVAQUIN) IV  500 mg Intravenous Q24H  . levothyroxine  175 mcg Oral Daily  . loratadine  10 mg Oral Daily  . magnesium sulfate 1 - 4 g bolus IVPB  2 g Intravenous Once  . metFORMIN  1,000 mg Oral BID WC  . methylPREDNISolone (SOLU-MEDROL) injection  80 mg Intravenous Q6H  . DISCONTD: sodium chloride   Intravenous STAT  . DISCONTD: magnesium sulfate LVP 250-500 ml  2 g Intravenous Once   Continuous Infusions:   . 0.9 % NaCl with KCl 20 mEq / L 100 mL/hr at 07/01/12 0758    Time spent: 35 minutes.   LOS: 2 days   Varvara Legault  [facility] Pager [phone].  If 8PM-8AM, please contact night-coverage at [email or web address], password [facility] 07/01/2012, 8:54 AM

## 2012-07-01 NOTE — Progress Notes (Signed)
Inpatient Diabetes Program Recommendations  AACE/ADA: New Consensus Statement on Inpatient Glycemic Control  Target Ranges:  Prepandial:   less than 140 mg/dL      Peak postprandial:   less than 180 mg/dL (1-2 hours)      Critically ill patients:  140 - 180 mg/dL  Pager:  [phone] Hours:  8 am-10pm   Reason for Visit: History of Diabetes  Inpatient Diabetes Program Recommendations HgbA1C: Please order HgbA1C to assess glycemic control  Alfredia Client PhD, RN, BC-ADM Diabetes Coordinator  Office:  [phone] Team Pager:  [phone]

## 2012-07-01 NOTE — Progress Notes (Signed)
Report called and given to [place], Charity fundraiser. Patient alert, oriented and in stable condition at the time of transfer. Patient transferred off tele and oxygen. Visitors at bedside.

## 2012-07-02 DIAGNOSIS — R7989 Other specified abnormal findings of blood chemistry: Secondary | ICD-10-CM

## 2012-07-02 LAB — HEMOGLOBIN A1C: Hgb A1c MFr Bld: 7.2 % — ABNORMAL HIGH (ref <5.7)

## 2012-07-02 LAB — BASIC METABOLIC PANEL
BUN: 19 mg/dL (ref 6–23)
Calcium: 8.7 mg/dL (ref 8.4–10.5)
Creatinine, Ser: 0.59 mg/dL (ref 0.50–1.10)
GFR calc Af Amer: >90 mL/min (ref >90)
GFR calc non Af Amer: >90 mL/min (ref >90)
Glucose, Bld: 134 mg/dL — ABNORMAL HIGH (ref 70–99)
Potassium: 4.6 mEq/L (ref 3.5–5.1)

## 2012-07-02 LAB — CBC
Hemoglobin: 12.3 g/dL (ref 12.0–15.0)
MCH: 27.4 pg (ref 26.0–34.0)
MCHC: 32.7 g/dL (ref 30.0–36.0)
RDW: 13.2 % (ref 11.5–15.5)

## 2012-07-02 LAB — GLUCOSE, CAPILLARY
Glucose-Capillary: 127 mg/dL — ABNORMAL HIGH (ref 70–99)
Glucose-Capillary: 149 mg/dL — ABNORMAL HIGH (ref 70–99)

## 2012-07-02 LAB — HEPATITIS PANEL, ACUTE
HCV Ab: NEGATIVE
Hep A IgM: NEGATIVE
Hep B C IgM: NEGATIVE

## 2012-07-02 MED ORDER — METHYLPREDNISOLONE SODIUM SUCC 125 MG IJ SOLR
60.0000 mg | Freq: Two times a day (BID) | INTRAMUSCULAR | Status: DC
  Administered 2012-07-02 – 2012-07-03 (×2): 60 mg via INTRAVENOUS
  Filled 2012-07-02: qty 2

## 2012-07-02 NOTE — Progress Notes (Addendum)
[facility] PROGRESS NOTE  CINDIE RAJAGOPALAN ZOX:[id number] DOB: 1956-08-14 DOA: 06/29/2012 PCP: [name], MD  Brief narrative:  Mrs. Stucki is a 56 year old woman with a PMH of bronchitis and a remote history of tobacco abuse who was admitted to the hospital on 06/30/12 with acute hypercapnic and hypoxic respiratory failure.   Assessment/Plan:  Principal Problem:  *Acute respiratory failure with hypercapnia  Patient informs of some allergic triggers causing asthma like symptoms .  patient was admitted to the ICU and given empiric IV Solumedrol, IV magnesium sulfate, nebulized bronchodilators, Advair, Levaquin, anti-tussives, anti-histamines and supplemental oxygen.  Improved and now  transfer to floor. Continue current treatment. Weaning solumedrol (60 mg q 12 today)  Active Problems:  Acute bronchitis  On emipiric Levaquin.( day 2)  DM type 2 (diabetes mellitus, type 2)  Increased Lantus, continue insulin resistant SSI, increase meal coverage to 5 units.  Continue Metformin.  Diabetes coordinator recommends assessing outpatient control, hbA1C pending. Patient doesn't have regular outpt follow up due to insurance.  Leukocytosis  Likely due to acute bronchitis. Continue empiric antibiotics.  HTN (hypertension)  Lisinopril/HCTZ currently on hold.  BP stable.  Seasonal allergies  Continue Zyrtec.  Hypothyroidism  Continue Synthroid. TSH WNL.  Sinus tachycardia  Likely related to respiratory distress. Now stable  Elevated LFTs  Mild and improving  acute hepatitis panel negative.  Code Status: Full   Family Communication: None at bedside.   Disposition Plan: Home when stable. Likely in 1-2 days if SOB inmproved  Medical Consultants:  None.   Procedures:  None. Antibiotics:  Levaquin 07/01/12--->  HPI/Subjective:   feels  better. She still has dyspnea on exertion and dry cough  Objective: Filed Vitals:   07/01/12 2202 07/02/12 0500 07/02/12 0727 07/02/12  1405  BP: 118/60 117/69  132/73  Pulse: 90 73  77  Temp: 98.5 F (36.9 C) 98.4 F (36.9 C)  98.2 F (36.8 C)  TempSrc: Oral Oral    Resp: 20 20  20   Height:      Weight:      SpO2: 94% 94% 96% 95%    Intake/Output Summary (Last 24 hours) at 07/02/12 1642 Last data filed at 07/02/12 1200  Gross per 24 hour  Intake    600 ml  Output      0 ml  Net    600 ml   Filed Weights   06/30/12 0900 06/30/12 0957 07/01/12 0417  Weight: 102.5 kg (225 lb 15.5 oz) 102.5 kg (225 lb 15.5 oz) 104.6 kg (230 lb 9.6 oz)    Exam:   General:  Middle aged obese female in NAD  Cardiovascular: NS1&s2, no murmurs, rubs or gallop  Respiratory: Clear breath sounds b/l, no added sounds  Abdomen: soft, NT, ND BS+  EXT; Warm, no edema  CNS: AAOX 3, non focal  Data Reviewed: Basic Metabolic Panel:  Lab 07/02/12 8119 07/01/12 0418 06/30/12 0411  NA 137 137 141  K 4.6 4.4 4.7  CL 103 102 100  CO2 26 24 27   GLUCOSE 134* 198* 245*  BUN 19 13 17   CREATININE 0.59 0.52 0.63  CALCIUM 8.7 8.8 10.0  MG -- -- --  PHOS -- -- --   Liver Function Tests:  Lab 07/01/12 0418 06/30/12 0411  AST 45* 76*  ALT 75* 108*  ALKPHOS 69 87  BILITOT 0.7 0.5  PROT 6.4 7.8  ALBUMIN 3.5 4.3   No results found for this basename: LIPASE:5,AMYLASE:5 in the last 168 hours  No results found for this basename: AMMONIA:5 in the last 168 hours CBC:  Lab 07/02/12 0525 07/01/12 0418 06/30/12 0411  WBC 16.6* 15.9* 14.9*  NEUTROABS -- -- 13.4*  HGB 12.3 12.3 13.4  HCT 37.6 37.1 41.3  MCV 83.7 83.2 83.1  PLT 254 262 271   Cardiac Enzymes: No results found for this basename: CKTOTAL:5,CKMB:5,CKMBINDEX:5,TROPONINI:5 in the last 168 hours BNP (last 3 results) No results found for this basename: PROBNP:3 in the last 8760 hours CBG:  Lab 07/02/12 1629 07/02/12 1139 07/02/12 0737 07/01/12 2204 07/01/12 1607  GLUCAP 110* 149* 127* 230* 111*    Recent Results (from the past 240 hour(s))  MRSA PCR SCREENING      Status: Normal   Collection Time   06/30/12  9:54 AM      Component Value Range Status Comment   MRSA by PCR NEGATIVE  NEGATIVE Final      Studies: No results found.  Scheduled Meds:   . albuterol  2.5 mg Nebulization BID  . benzonatate  100 mg Oral TID  . citalopram  40 mg Oral Daily  . docusate sodium  100 mg Oral BID  . enoxaparin (LOVENOX) injection  40 mg Subcutaneous Q24H  . famotidine  20 mg Oral BID  . fluticasone  1 spray Each Nare Daily  . Fluticasone-Salmeterol  1 puff Inhalation BID  . guaiFENesin  600 mg Oral BID  . insulin aspart  0-20 Units Subcutaneous TID WC  . insulin aspart  0-5 Units Subcutaneous QHS  . insulin aspart  5 Units Subcutaneous TID WC  . insulin glargine  25 Units Subcutaneous BID  . ipratropium  0.5 mg Nebulization BID  . levofloxacin (LEVAQUIN) IV  500 mg Intravenous Q24H  . levothyroxine  175 mcg Oral Daily  . loratadine  10 mg Oral Daily  . metFORMIN  1,000 mg Oral BID WC  . methylPREDNISolone (SOLU-MEDROL) injection  80 mg Intravenous Q12H  . DISCONTD: albuterol  2.5 mg Nebulization QID  . DISCONTD: ipratropium  0.5 mg Nebulization QID   Continuous Infusions:   . 0.9 % NaCl with KCl 20 mEq / L 100 mL/hr at 07/02/12 1624       Time spent: 30 minutes    Zitlaly Malson  [facility] Pager [phone]. If 8PM-8AM, please contact night-coverage at [email or web address], password [facility] 07/02/2012, 4:42 PM  LOS: 3 days

## 2012-07-02 NOTE — Care Management Note (Unsigned)
    Page 1 of 1   07/02/2012     4:10:44 PM   CARE MANAGEMENT NOTE 07/02/2012  Patient:  Sarah Pope, Sarah Pope   Account Number:  [id number]  Date Initiated:  07/02/2012  Documentation initiated by:  Rosemary Holms  Subjective/Objective Assessment:   Pt admitted from home with acute respiratory failure from Bronchitis. Has a Neb machine at home. no anticiptated needs identified with pt.     Action/Plan:   CM to follow   Anticipated DC Date:  07/03/2012   Anticipated DC Plan:  HOME/SELF CARE      DC Planning Services  CM consult      Choice offered to / List presented to:             Status of service:  In process, will continue to follow Medicare Important Message given?   (If response is "NO", the following Medicare IM given date fields will be blank) Date Medicare IM given:   Date Additional Medicare IM given:    Discharge Disposition:    Per UR Regulation:    If discussed at Long Length of Stay Meetings, dates discussed:    Comments:  07/02/12 1400 Junior Huezo Leanord Hawking RN BSN CM

## 2012-07-02 NOTE — Progress Notes (Signed)
UR Chart Review Completed  

## 2012-07-03 LAB — GLUCOSE, CAPILLARY
Glucose-Capillary: 157 mg/dL — ABNORMAL HIGH (ref 70–99)
Glucose-Capillary: 92 mg/dL (ref 70–99)

## 2012-07-03 MED ORDER — LEVOFLOXACIN 500 MG PO TABS: 500.0000 mg | ORAL_TABLET | ORAL | Status: DC

## 2012-07-03 MED ORDER — LEVOFLOXACIN 500 MG PO TABS: 500.0000 mg | ORAL_TABLET | ORAL | Status: AC

## 2012-07-03 MED ORDER — PREDNISONE 10 MG PO TABS: ORAL_TABLET | ORAL | Status: DC

## 2012-07-03 MED ORDER — INFLUENZA VIRUS VACC SPLIT PF IM SUSP
0.5000 mL | Freq: Once | INTRAMUSCULAR | Status: AC
  Administered 2012-07-03: 0.5 mL via INTRAMUSCULAR
  Filled 2012-07-03: qty 0.5

## 2012-07-03 MED ORDER — ACETAMINOPHEN 325 MG PO TABS: 650.0000 mg | ORAL_TABLET | Freq: Four times a day (QID) | ORAL | Status: AC | PRN

## 2012-07-03 MED ORDER — FLUTICASONE-SALMETEROL 250-50 MCG/DOSE IN AEPB: 1.0000 | INHALATION_SPRAY | Freq: Two times a day (BID) | RESPIRATORY_TRACT | Status: DC

## 2012-07-03 NOTE — Discharge Summary (Deleted)
[facility]  [address]  [place] [address]  Phone: [phone]  Fax: [phone]  July 03, 2012     Patient:  Sarah Pope     Date of Birth:  07/14/1956     Date of Visit:  06/29/2012     To Whom It May Concern:  Sarah Pope was seen and treated in our emergency department on 06/29/2012. Sarah Pope may return to work on 07/08/12.   Sincerely,     Cloteal Isaacson, M.D.      

## 2012-07-03 NOTE — Discharge Summary (Signed)
Physician Discharge Summary  Patient ID: Sarah Pope MRN: [id number] DOB/AGE: 55/55/1957 55 y.o.  Admit date: 06/29/2012 Discharge date: 07/03/2012  Discharge Diagnoses:  Principal Problem:  *Acute respiratory failure with hypercapnia Active Problems:  Acute bronchitis  DM type 2 (diabetes mellitus, type 2)  HTN (hypertension)  OBESITY  Leukocytosis  Seasonal allergies  Hypothyroidism  Sinus tachycardia  Elevated LFTs     Medication List     As of 07/03/2012  1:34 PM    TAKE these medications         acetaminophen 325 MG tablet   Commonly known as: TYLENOL   Take 2 tablets (650 mg total) by mouth every 6 (six) hours as needed for pain or fever.      albuterol 108 (90 BASE) MCG/ACT inhaler   Commonly known as: PROVENTIL HFA;VENTOLIN HFA   Inhale 2 puffs into the lungs every 6 (six) hours as needed. Shortness of Breath/ Tightness in Chest      albuterol (2.5 MG/3ML) 0.083% nebulizer solution   Commonly known as: PROVENTIL   Take 2.5 mg by nebulization every 6 (six) hours as needed. Shortness of Breath/ Tightness in Chest      aspirin EC 81 MG tablet   Take 81 mg by mouth daily.      cetirizine 10 MG tablet   Commonly known as: ZYRTEC   Take 10 mg by mouth daily.      ciprofloxacin-hydrocortisone otic suspension   Commonly known as: CIPRO HC OTIC   Place 3 drops into both ears daily as needed. Ear Pain      citalopram 40 MG tablet   Commonly known as: CELEXA   Take 40 mg by mouth daily.      dextromethorphan-guaiFENesin 30-600 MG per 12 hr tablet   Commonly known as: MUCINEX DM   Take 1 tablet by mouth 2 (two) times daily as needed. Cough Symptoms      Fluticasone-Salmeterol 250-50 MCG/DOSE Aepb   Commonly known as: ADVAIR   Inhale 1 puff into the lungs 2 (two) times daily.      insulin glargine 100 UNIT/ML injection   Commonly known as: LANTUS   Inject 30 Units into the skin at bedtime.      insulin lispro 100 UNIT/ML injection   Commonly known as:  HUMALOG   Inject into the skin 3 (three) times daily as needed. Patient uses according to a sliding scale at home. Only uses it when sugar is high.      levofloxacin 500 MG tablet   Commonly known as: LEVAQUIN   Take 1 tablet (500 mg total) by mouth daily.      levothyroxine 175 MCG tablet   Commonly known as: SYNTHROID, LEVOTHROID   Take 175 mcg by mouth daily.      lisinopril-hydrochlorothiazide 10-12.5 MG per tablet   Commonly known as: PRINZIDE,ZESTORETIC   Take 1 tablet by mouth daily.      metFORMIN 1000 MG tablet   Commonly known as: GLUCOPHAGE   Take 1,000 mg by mouth 2 (two) times daily with a meal.      mometasone 50 MCG/ACT nasal spray   Commonly known as: NASONEX   Place 2 sprays into the nose daily as needed. Congestion      NYQUIL D COLD/FLU 60-12.03-21-999 MG/30ML Liqd   Generic drug: Pseudoeph-Doxylamine-DM-APAP   Take 30 mLs by mouth every 8 (eight) hours as needed. Cold Symptoms      oxazepam 10 MG capsule   Commonly known  as: SERAX   Take 10 mg by mouth at bedtime as needed. Anxiety      pravastatin 20 MG tablet   Commonly known as: PRAVACHOL   Take 20 mg by mouth daily.      predniSONE 10 MG tablet   Commonly known as: DELTASONE   4 tablets daily for 2 days, then decrease by 1 tablet every 2 days until off            Disposition: 01-Home or Self Care  Discharged Condition:   Consults:    Labs:   Results for orders placed during the hospital encounter of 06/29/12 (from the past 48 hour(s))  GLUCOSE, CAPILLARY     Status: Abnormal   Collection Time   07/01/12  4:07 PM      Component Value Range Comment   Glucose-Capillary 111 (*) 70 - 99 mg/dL   GLUCOSE, CAPILLARY     Status: Abnormal   Collection Time   07/01/12 10:04 PM      Component Value Range Comment   Glucose-Capillary 230 (*) 70 - 99 mg/dL    Comment 1 Notify RN     BASIC METABOLIC PANEL     Status: Abnormal   Collection Time   07/02/12  5:25 AM      Component Value Range  Comment   Sodium 137  135 - 145 mEq/L    Potassium 4.6  3.5 - 5.1 mEq/L    Chloride 103  96 - 112 mEq/L    CO2 26  19 - 32 mEq/L    Glucose, Bld 134 (*) 70 - 99 mg/dL    BUN 19  6 - 23 mg/dL    Creatinine, Ser 1.02  0.50 - 1.10 mg/dL    Calcium 8.7  8.4 - 72.5 mg/dL    GFR calc non Af Amer >90  >90 mL/min    GFR calc Af Amer >90  >90 mL/min   CBC     Status: Abnormal   Collection Time   07/02/12  5:25 AM      Component Value Range Comment   WBC 16.6 (*) 4.0 - 10.5 K/uL    RBC 4.49  3.87 - 5.11 MIL/uL    Hemoglobin 12.3  12.0 - 15.0 g/dL    HCT 36.6  44.0 - 34.7 %    MCV 83.7  78.0 - 100.0 fL    MCH 27.4  26.0 - 34.0 pg    MCHC 32.7  30.0 - 36.0 g/dL    RDW 42.5  95.6 - 38.7 %    Platelets 254  150 - 400 K/uL   HEMOGLOBIN A1C     Status: Abnormal   Collection Time   07/02/12  5:25 AM      Component Value Range Comment   Hemoglobin A1C 7.2 (*) <5.7 %    Mean Plasma Glucose 160 (*) <117 mg/dL   GLUCOSE, CAPILLARY     Status: Abnormal   Collection Time   07/02/12  7:37 AM      Component Value Range Comment   Glucose-Capillary 127 (*) 70 - 99 mg/dL    Comment 1 Documented in Chart      Comment 2 Notify RN     GLUCOSE, CAPILLARY     Status: Abnormal   Collection Time   07/02/12 11:39 AM      Component Value Range Comment   Glucose-Capillary 149 (*) 70 - 99 mg/dL    Comment 1 Documented in Chart  Comment 2 Notify RN     GLUCOSE, CAPILLARY     Status: Abnormal   Collection Time   07/02/12  4:29 PM      Component Value Range Comment   Glucose-Capillary 110 (*) 70 - 99 mg/dL    Comment 1 Documented in Chart      Comment 2 Notify RN     GLUCOSE, CAPILLARY     Status: Abnormal   Collection Time   07/02/12  8:39 PM      Component Value Range Comment   Glucose-Capillary 149 (*) 70 - 99 mg/dL   GLUCOSE, CAPILLARY     Status: Abnormal   Collection Time   07/03/12  7:34 AM      Component Value Range Comment   Glucose-Capillary 112 (*) 70 - 99 mg/dL    Comment 1 Notify RN       GLUCOSE, CAPILLARY     Status: Abnormal   Collection Time   07/03/12 11:49 AM      Component Value Range Comment   Glucose-Capillary 157 (*) 70 - 99 mg/dL     Diagnostics:  Dg Chest Port 1 View  06/30/2012  *RADIOLOGY REPORT*  Clinical Data: Shortness of breath.  PORTABLE CHEST - 1 VIEW  Comparison: 09/13/2011  Findings: Shallow inspiration. The heart size and pulmonary vascularity are normal. The lungs appear clear and expanded without focal air space disease or consolidation. No blunting of the costophrenic angles.  No pneumothorax.  Mediastinal contours appear intact.  Asymmetric density over the left chest is likely due to soft tissue attenuation.  No significant change since previous study.  IMPRESSION: No evidence of active pulmonary disease.   Original Report Authenticated By: Marlon Pel, M.D.     Procedures:  none  EKG: sinus tacycadia  Full Code   Hospital Course: see H&P for admission details.  Sarah Pope is a 56 y.o female who presented with a several day h/o worsening dyspnea, cough, congestion.  She also had wheezing and pleuritic chest pain.  In the ED, her ABG revealed a pH of 7.28, PCO2 of 49. CXR showed nothing acute.  WBC was 15. Glucose 245.  Transaminases slightly elevated.  She had wheezes and labored breathing when talking.  The patient was admitted to step down unit, started on steroids, bronchodilators, antibiotics, oxygen.  Her symtoms imroved and she had clear breath sounds and was ambulating and feeling much better at discharge.  Total time on the day of discharge is greater than 30 minutes.  Discharge Exam:  Blood pressure 134/84, pulse 68, temperature 97.4 F (36.3 C), temperature source Oral, resp. rate 18, height 5\' 5"  (1.651 m), weight 104.6 kg (230 lb 9.6 oz), SpO2 94.00%.  Gen:  Comfortable Lungs:  CTA without WRR CV:  RRR without MGR Ext: no cce   Signed: Zakari Bathe L 07/03/2012, 1:34 PM

## 2012-07-03 NOTE — Progress Notes (Signed)
Pt given discharge instructions. Pt verbalized understanding of all discharge instructions. Pt received new med prescriptions and a doctors note for work. Pt also received a flu vaccine before discharge. Orson Ape D 07/03/2012

## 2012-07-03 NOTE — Progress Notes (Signed)
[facility] SURGICAL UNIT  [address]  [place] [address]  Phone: [phone]  Fax: [phone]  July 03, 2012     Patient:  Sarah Pope     Date of Birth:  Sep 04, 1956     Date of Visit:  06/29/2012     To Whom It May Concern:  Ronnita Lutze was seen and treated in our emergency department on 06/29/2012. Marjie D Stoker may return to work on 07/08/12.   Sincerely,     Crista Curb, M.D.

## 2012-07-03 NOTE — Progress Notes (Signed)
PHARMACIST - PHYSICIAN COMMUNICATION DR:   Lendell Caprice CONCERNING: Antibiotic IV to Oral Route Change Policy  RECOMMENDATION: This patient is receiving Levaquin by the intravenous route.  Based on criteria approved by the Pharmacy and Therapeutics Committee, the antibiotic(s) is/are being converted to the equivalent oral dose form(s).   DESCRIPTION: These criteria include:  Patient being treated for a respiratory tract infection, urinary tract infection, or cellulitis  The patient is not neutropenic and does not exhibit a GI malabsorption state  The patient is eating (either orally or via tube) and/or has been taking other orally administered medications for a least 24 hours  The patient is improving clinically and has a Tmax < 100.5  If you have questions about this conversion, please contact the Pharmacy Department  [x]   [phone] )  [facility] []   [phone] )  [facility]  []   [phone] )  [facility] []   [phone] )  [facility]   07/03/2012@11 :06 AM

## 2012-09-01 ENCOUNTER — Inpatient Hospital Stay (HOSPITAL_COMMUNITY)
Admission: EM | Admit: 2012-09-01 | Discharge: 2012-09-03 | DRG: 189 | Disposition: A | Discharge status: Home / Self Care | Payer: Medicaid Other | Attending: Internal Medicine | Admitting: Internal Medicine

## 2012-09-01 ENCOUNTER — Emergency Department (HOSPITAL_COMMUNITY): Payer: Medicaid Other

## 2012-09-01 ENCOUNTER — Encounter (HOSPITAL_COMMUNITY): Payer: Self-pay | Admitting: Emergency Medicine

## 2012-09-01 DIAGNOSIS — K7689 Other specified diseases of liver: Secondary | ICD-10-CM | POA: Diagnosis present

## 2012-09-01 DIAGNOSIS — J441 Chronic obstructive pulmonary disease with (acute) exacerbation: Secondary | ICD-10-CM

## 2012-09-01 DIAGNOSIS — E8729 Other acidosis: Secondary | ICD-10-CM

## 2012-09-01 DIAGNOSIS — F329 Major depressive disorder, single episode, unspecified: Secondary | ICD-10-CM | POA: Diagnosis present

## 2012-09-01 DIAGNOSIS — Z7982 Long term (current) use of aspirin: Secondary | ICD-10-CM

## 2012-09-01 DIAGNOSIS — J302 Other seasonal allergic rhinitis: Secondary | ICD-10-CM

## 2012-09-01 DIAGNOSIS — J209 Acute bronchitis, unspecified: Secondary | ICD-10-CM

## 2012-09-01 DIAGNOSIS — J9602 Acute respiratory failure with hypercapnia: Secondary | ICD-10-CM

## 2012-09-01 DIAGNOSIS — E872 Acidosis, unspecified: Secondary | ICD-10-CM | POA: Diagnosis present

## 2012-09-01 DIAGNOSIS — Z6838 Body mass index (BMI) 38.0-38.9, adult: Secondary | ICD-10-CM

## 2012-09-01 DIAGNOSIS — E039 Hypothyroidism, unspecified: Secondary | ICD-10-CM

## 2012-09-01 DIAGNOSIS — Z79899 Other long term (current) drug therapy: Secondary | ICD-10-CM

## 2012-09-01 DIAGNOSIS — J45901 Unspecified asthma with (acute) exacerbation: Secondary | ICD-10-CM | POA: Diagnosis present

## 2012-09-01 DIAGNOSIS — D72829 Elevated white blood cell count, unspecified: Secondary | ICD-10-CM

## 2012-09-01 DIAGNOSIS — J45909 Unspecified asthma, uncomplicated: Secondary | ICD-10-CM

## 2012-09-01 DIAGNOSIS — F3289 Other specified depressive episodes: Secondary | ICD-10-CM | POA: Diagnosis present

## 2012-09-01 DIAGNOSIS — I10 Essential (primary) hypertension: Secondary | ICD-10-CM

## 2012-09-01 DIAGNOSIS — R7989 Other specified abnormal findings of blood chemistry: Secondary | ICD-10-CM

## 2012-09-01 DIAGNOSIS — K219 Gastro-esophageal reflux disease without esophagitis: Secondary | ICD-10-CM | POA: Diagnosis present

## 2012-09-01 DIAGNOSIS — J9600 Acute respiratory failure, unspecified whether with hypoxia or hypercapnia: Principal | ICD-10-CM | POA: Diagnosis present

## 2012-09-01 DIAGNOSIS — E119 Type 2 diabetes mellitus without complications: Secondary | ICD-10-CM

## 2012-09-01 DIAGNOSIS — R0600 Dyspnea, unspecified: Secondary | ICD-10-CM

## 2012-09-01 DIAGNOSIS — R000 Tachycardia, unspecified: Secondary | ICD-10-CM

## 2012-09-01 DIAGNOSIS — I498 Other specified cardiac arrhythmias: Secondary | ICD-10-CM | POA: Diagnosis present

## 2012-09-01 DIAGNOSIS — E785 Hyperlipidemia, unspecified: Secondary | ICD-10-CM | POA: Diagnosis present

## 2012-09-01 DIAGNOSIS — E669 Obesity, unspecified: Secondary | ICD-10-CM

## 2012-09-01 DIAGNOSIS — Z794 Long term (current) use of insulin: Secondary | ICD-10-CM

## 2012-09-01 DIAGNOSIS — J9801 Acute bronchospasm: Secondary | ICD-10-CM

## 2012-09-01 HISTORY — DX: Acute bronchitis, unspecified: J20.9

## 2012-09-01 LAB — URINALYSIS, ROUTINE W REFLEX MICROSCOPIC
Glucose, UA: >1000 mg/dL — AB
Hgb urine dipstick: NEGATIVE
Protein, ur: NEGATIVE mg/dL

## 2012-09-01 LAB — CBC WITH DIFFERENTIAL/PLATELET
Basophils Absolute: 0.1 10*3/uL (ref 0.0–0.1)
Basophils Relative: 1 % (ref 0–1)
Eosinophils Absolute: 0.7 10*3/uL (ref 0.0–0.7)
Eosinophils Relative: 6 % — ABNORMAL HIGH (ref 0–5)
HCT: 43.6 % (ref 36.0–46.0)
MCH: 27.2 pg (ref 26.0–34.0)
MCHC: 32.3 g/dL (ref 30.0–36.0)
MCV: 84.2 fL (ref 78.0–100.0)
Monocytes Absolute: 0.5 10*3/uL (ref 0.1–1.0)
RDW: 13 % (ref 11.5–15.5)

## 2012-09-01 LAB — BLOOD GAS, ARTERIAL
Bicarbonate: 24.8 mEq/L — ABNORMAL HIGH (ref 20.0–24.0)
Expiratory PAP: 6
FIO2: 30 %
O2 Saturation: 94.4 %
pH, Arterial: 7.253 — ABNORMAL LOW (ref 7.350–7.450)
pO2, Arterial: 81.7 mmHg (ref 80.0–100.0)

## 2012-09-01 LAB — GLUCOSE, CAPILLARY
Glucose-Capillary: 242 mg/dL — ABNORMAL HIGH (ref 70–99)
Glucose-Capillary: 229 mg/dL — ABNORMAL HIGH (ref 70–99)

## 2012-09-01 LAB — BASIC METABOLIC PANEL
BUN: 19 mg/dL (ref 6–23)
CO2: 34 mEq/L — ABNORMAL HIGH (ref 19–32)
Chloride: 97 mEq/L (ref 96–112)
Creatinine, Ser: 0.71 mg/dL (ref 0.50–1.10)

## 2012-09-01 LAB — URINE MICROSCOPIC-ADD ON

## 2012-09-01 LAB — MRSA PCR SCREENING: MRSA by PCR: NEGATIVE

## 2012-09-01 MED ORDER — IPRATROPIUM BROMIDE 0.02 % IN SOLN
0.5000 mg | Freq: Four times a day (QID) | RESPIRATORY_TRACT | Status: DC
  Administered 2012-09-01 – 2012-09-03 (×7): 0.5 mg via RESPIRATORY_TRACT
  Filled 2012-09-01 (×7): qty 2.5

## 2012-09-01 MED ORDER — MOMETASONE FURO-FORMOTEROL FUM 100-5 MCG/ACT IN AERO
INHALATION_SPRAY | RESPIRATORY_TRACT | Status: AC
  Filled 2012-09-01: qty 8.8

## 2012-09-01 MED ORDER — IPRATROPIUM BROMIDE 0.02 % IN SOLN: 1.0000 mg | Freq: Once | RESPIRATORY_TRACT | Status: DC

## 2012-09-01 MED ORDER — LEVOTHYROXINE SODIUM 75 MCG PO TABS
175.0000 ug | ORAL_TABLET | Freq: Every day | ORAL | Status: DC
  Administered 2012-09-01 – 2012-09-03 (×3): 175 ug via ORAL
  Filled 2012-09-01 (×3): qty 1

## 2012-09-01 MED ORDER — HYDROCOD POLST-CHLORPHEN POLST 10-8 MG/5ML PO LQCR
5.0000 mL | Freq: Two times a day (BID) | ORAL | Status: DC | PRN
  Administered 2012-09-01 – 2012-09-02 (×3): 5 mL via ORAL
  Filled 2012-09-01 (×3): qty 5

## 2012-09-01 MED ORDER — ENOXAPARIN SODIUM 40 MG/0.4ML ~~LOC~~ SOLN
40.0000 mg | SUBCUTANEOUS | Status: DC
  Administered 2012-09-01 – 2012-09-02 (×2): 40 mg via SUBCUTANEOUS
  Filled 2012-09-01 (×2): qty 0.4

## 2012-09-01 MED ORDER — ONDANSETRON HCL 4 MG/2ML IJ SOLN
4.0000 mg | INTRAMUSCULAR | Status: DC | PRN
  Administered 2012-09-01: 4 mg via INTRAVENOUS
  Filled 2012-09-01: qty 2

## 2012-09-01 MED ORDER — LEVALBUTEROL HCL 0.63 MG/3ML IN NEBU
0.6300 mg | INHALATION_SOLUTION | RESPIRATORY_TRACT | Status: DC | PRN
  Administered 2012-09-02: 0.63 mg via RESPIRATORY_TRACT
  Filled 2012-09-01: qty 3

## 2012-09-01 MED ORDER — ONDANSETRON HCL 4 MG PO TABS: 4.0000 mg | ORAL_TABLET | Freq: Four times a day (QID) | ORAL | Status: DC | PRN

## 2012-09-01 MED ORDER — METHYLPREDNISOLONE SODIUM SUCC 125 MG IJ SOLR
60.0000 mg | Freq: Four times a day (QID) | INTRAMUSCULAR | Status: DC
  Administered 2012-09-01 – 2012-09-03 (×7): 60 mg via INTRAVENOUS
  Filled 2012-09-01 (×7): qty 2

## 2012-09-01 MED ORDER — PANTOPRAZOLE SODIUM 40 MG PO TBEC
40.0000 mg | DELAYED_RELEASE_TABLET | Freq: Two times a day (BID) | ORAL | Status: DC
  Administered 2012-09-01 – 2012-09-03 (×4): 40 mg via ORAL
  Filled 2012-09-01 (×4): qty 1

## 2012-09-01 MED ORDER — ACETAMINOPHEN 325 MG PO TABS: 650.0000 mg | ORAL_TABLET | Freq: Four times a day (QID) | ORAL | Status: DC | PRN

## 2012-09-01 MED ORDER — INSULIN GLARGINE 100 UNIT/ML ~~LOC~~ SOLN
30.0000 [IU] | Freq: Every day | SUBCUTANEOUS | Status: DC
  Administered 2012-09-01: 30 [IU] via SUBCUTANEOUS

## 2012-09-01 MED ORDER — SIMVASTATIN 20 MG PO TABS
10.0000 mg | ORAL_TABLET | Freq: Every day | ORAL | Status: DC
  Administered 2012-09-01 – 2012-09-02 (×2): 10 mg via ORAL
  Filled 2012-09-01 (×3): qty 1

## 2012-09-01 MED ORDER — SODIUM CHLORIDE 0.9 % IV SOLN
INTRAVENOUS | Status: AC
  Administered 2012-09-01 – 2012-09-02 (×2): via INTRAVENOUS

## 2012-09-01 MED ORDER — CITALOPRAM HYDROBROMIDE 20 MG PO TABS
40.0000 mg | ORAL_TABLET | Freq: Every day | ORAL | Status: DC
  Administered 2012-09-01 – 2012-09-03 (×3): 40 mg via ORAL
  Filled 2012-09-01 (×2): qty 1
  Filled 2012-09-01 (×2): qty 2

## 2012-09-01 MED ORDER — BENZONATATE 100 MG PO CAPS
100.0000 mg | ORAL_CAPSULE | Freq: Three times a day (TID) | ORAL | Status: DC
  Administered 2012-09-01 – 2012-09-03 (×5): 100 mg via ORAL
  Filled 2012-09-01 (×5): qty 1

## 2012-09-01 MED ORDER — ACETAMINOPHEN 650 MG RE SUPP: 650.0000 mg | Freq: Four times a day (QID) | RECTAL | Status: DC | PRN

## 2012-09-01 MED ORDER — HYDROCODONE-ACETAMINOPHEN 5-325 MG PO TABS
1.0000 | ORAL_TABLET | ORAL | Status: DC | PRN
  Administered 2012-09-01 – 2012-09-02 (×2): 2 via ORAL
  Administered 2012-09-02: 1 via ORAL
  Filled 2012-09-01 (×2): qty 2
  Filled 2012-09-01: qty 1

## 2012-09-01 MED ORDER — ALBUTEROL SULFATE (5 MG/ML) 0.5% IN NEBU: 10.0000 mg | INHALATION_SOLUTION | Freq: Once | RESPIRATORY_TRACT | Status: DC

## 2012-09-01 MED ORDER — MOXIFLOXACIN HCL IN NACL 400 MG/250ML IV SOLN
INTRAVENOUS | Status: AC
  Filled 2012-09-01: qty 250

## 2012-09-01 MED ORDER — GUAIFENESIN ER 600 MG PO TB12
600.0000 mg | ORAL_TABLET | Freq: Two times a day (BID) | ORAL | Status: DC
  Administered 2012-09-01 – 2012-09-03 (×4): 600 mg via ORAL
  Filled 2012-09-01 (×4): qty 1

## 2012-09-01 MED ORDER — INSULIN ASPART 100 UNIT/ML ~~LOC~~ SOLN
0.0000 [IU] | Freq: Every day | SUBCUTANEOUS | Status: DC
  Administered 2012-09-01 – 2012-09-02 (×2): 2 [IU] via SUBCUTANEOUS

## 2012-09-01 MED ORDER — LEVALBUTEROL HCL 0.63 MG/3ML IN NEBU
0.6300 mg | INHALATION_SOLUTION | RESPIRATORY_TRACT | Status: DC | PRN
Start: 2012-09-01 — End: 2012-09-01

## 2012-09-01 MED ORDER — MOXIFLOXACIN HCL IN NACL 400 MG/250ML IV SOLN
400.0000 mg | INTRAVENOUS | Status: DC
  Administered 2012-09-01 – 2012-09-02 (×2): 400 mg via INTRAVENOUS
  Filled 2012-09-01 (×2): qty 250

## 2012-09-01 MED ORDER — SODIUM CHLORIDE 0.9 % IV SOLN
INTRAVENOUS | Status: DC
  Administered 2012-09-01: 09:00:00 via INTRAVENOUS

## 2012-09-01 MED ORDER — LORAZEPAM 0.5 MG PO TABS
1.0000 mg | ORAL_TABLET | Freq: Every evening | ORAL | Status: DC | PRN
  Administered 2012-09-01 – 2012-09-02 (×2): 1 mg via ORAL
  Filled 2012-09-01 (×2): qty 2

## 2012-09-01 MED ORDER — ONDANSETRON HCL 4 MG/2ML IJ SOLN: 4.0000 mg | Freq: Four times a day (QID) | INTRAMUSCULAR | Status: DC | PRN

## 2012-09-01 MED ORDER — ALBUTEROL (5 MG/ML) CONTINUOUS INHALATION SOLN
INHALATION_SOLUTION | RESPIRATORY_TRACT | Status: AC
  Filled 2012-09-01: qty 20

## 2012-09-01 MED ORDER — LEVALBUTEROL HCL 0.63 MG/3ML IN NEBU
0.6300 mg | INHALATION_SOLUTION | Freq: Four times a day (QID) | RESPIRATORY_TRACT | Status: DC
  Administered 2012-09-01: 0.63 mg via RESPIRATORY_TRACT

## 2012-09-01 MED ORDER — LEVALBUTEROL HCL 0.63 MG/3ML IN NEBU
0.6300 mg | INHALATION_SOLUTION | Freq: Four times a day (QID) | RESPIRATORY_TRACT | Status: DC
  Administered 2012-09-01 – 2012-09-03 (×7): 0.63 mg via RESPIRATORY_TRACT
  Filled 2012-09-01 (×7): qty 3

## 2012-09-01 MED ORDER — MOMETASONE FURO-FORMOTEROL FUM 100-5 MCG/ACT IN AERO
2.0000 | INHALATION_SPRAY | Freq: Two times a day (BID) | RESPIRATORY_TRACT | Status: DC
  Administered 2012-09-01 – 2012-09-03 (×4): 2 via RESPIRATORY_TRACT
  Filled 2012-09-01: qty 8.8

## 2012-09-01 MED ORDER — INSULIN ASPART 100 UNIT/ML ~~LOC~~ SOLN
0.0000 [IU] | Freq: Three times a day (TID) | SUBCUTANEOUS | Status: DC
  Administered 2012-09-01: 5 [IU] via SUBCUTANEOUS
  Administered 2012-09-02 (×2): 3 [IU] via SUBCUTANEOUS
  Administered 2012-09-02 – 2012-09-03 (×2): 5 [IU] via SUBCUTANEOUS

## 2012-09-01 MED ORDER — ASPIRIN EC 81 MG PO TBEC
81.0000 mg | DELAYED_RELEASE_TABLET | Freq: Every day | ORAL | Status: DC
  Administered 2012-09-01 – 2012-09-03 (×3): 81 mg via ORAL
  Filled 2012-09-01 (×3): qty 1

## 2012-09-01 NOTE — H&P (Signed)
[facility] History and Physical  TRISTON LISANTI AOZ:[id number] DOB: 1955-12-04 DOA: 09/01/2012  Referring physician: Dr. Clarene Duke PCP: [name], MD  Specialists:   Chief Complaint: Shortness of breath  HPI: Sarah Pope is a 56 y.o. female who presents to the emergency room with shortness of breath. She was recently hospital in September 2013 with similar symptoms. She has not been formally diagnosed with any respiratory illness including asthma or COPD. She reports that she says she does not have insurance, she has been unable to afford testing including PFTs. She is on when necessary albuterol as well as Advair. She reports taking Advair when she is able to obtain referring since she cannot afford it. Her previous admission she was diagnosed with a bronchitis and was sent home with steroids and dilators. She reports since then she's been using her bronchodilators on a daily basis. Over the past 2-3 days she's noticed significant significant worsening in her shortness of breath. She's noticed increased wheezing earlier today and her cough has become somewhat productive. She has hot flashes and therefore does not know if she's truly had a fever. She rested emergency room she was significantly wheezing. EMS did give her steroids and nebulization treatments. ER physician placed the patient on BiPAP therapy since her blood gas showed a respiratory acidosis. The patient was admitted to the step down for further treatments.  Review of Systems: Pertinent positives as per history of present illness, otherwise negative  Past Medical History  Diagnosis Date  . Seasonal allergies   . Depression   . Diabetes mellitus   . GERD (gastroesophageal reflux disease)   . Hyperlipidemia   . Hypertension   . Hypothyroidism   . FATTY LIVER DISEASE 08/05/2008  . PERSONAL HX COLONIC POLYPS 09/01/2008  . TRANSAMINASES, SERUM, ELEVATED 08/05/2008  . Obesity, Class III, BMI 40-49.9 (morbid obesity)   .  Bronchitis with bronchospasm    Past Surgical History  Procedure Date  . Mandible surgery 1988    realignment  . Nose surgery 2007    sinus infections   Social History:  reports that she quit smoking about 20 years ago. She has never used smokeless tobacco. She reports that she does not drink alcohol or use illicit drugs. Patient lives at home and is independent with ADLs.  Allergies  Allergen Reactions  . Codeine Nausea Only  . Sulfa Antibiotics     itching    Family History  Problem Relation Age of Onset  . Colon cancer Neg Hx   . Esophageal cancer Neg Hx   . Rectal cancer Neg Hx   . Stomach cancer Neg Hx     Prior to Admission medications   Medication Sig Start Date End Date Taking? Authorizing Provider  acetaminophen (TYLENOL) 325 MG tablet Take 2 tablets (650 mg total) by mouth every 6 (six) hours as needed for pain or fever. 07/03/12 07/03/13 Yes Christiane Ha, MD  albuterol (PROVENTIL HFA;VENTOLIN HFA) 108 (90 BASE) MCG/ACT inhaler Inhale 2 puffs into the lungs every 6 (six) hours as needed. Shortness of Breath/ Tightness in Chest   Yes Historical Provider, MD  albuterol (PROVENTIL) (2.5 MG/3ML) 0.083% nebulizer solution Take 2.5 mg by nebulization every 6 (six) hours as needed. Shortness of Breath/ Tightness in Chest   Yes Historical Provider, MD  aspirin EC 81 MG tablet Take 81 mg by mouth daily.   Yes Historical Provider, MD  cetirizine (ZYRTEC) 10 MG tablet Take 10 mg by mouth daily.   Yes  Historical Provider, MD  citalopram (CELEXA) 40 MG tablet Take 40 mg by mouth daily.   Yes Historical Provider, MD  dextromethorphan-guaiFENesin (MUCINEX DM) 30-600 MG per 12 hr tablet Take 1 tablet by mouth 2 (two) times daily as needed. Cough Symptoms   Yes Historical Provider, MD  Fluticasone-Salmeterol (ADVAIR) 250-50 MCG/DOSE AEPB Inhale 1 puff into the lungs 2 (two) times daily. 07/03/12  Yes Christiane Ha, MD  insulin glargine (LANTUS) 100 UNIT/ML injection Inject 30  Units into the skin at bedtime.   Yes Historical Provider, MD  insulin lispro (HUMALOG) 100 UNIT/ML injection Inject into the skin 3 (three) times daily as needed. Patient uses according to a sliding scale at home. Only uses it when sugar is high.   Yes Historical Provider, MD  levothyroxine (SYNTHROID, LEVOTHROID) 175 MCG tablet Take 175 mcg by mouth daily.   Yes Historical Provider, MD  lisinopril-hydrochlorothiazide (PRINZIDE,ZESTORETIC) 10-12.5 MG per tablet Take 1 tablet by mouth daily.   Yes Historical Provider, MD  metFORMIN (GLUCOPHAGE) 1000 MG tablet Take 1,000 mg by mouth 2 (two) times daily with a meal.   Yes Historical Provider, MD  mometasone (NASONEX) 50 MCG/ACT nasal spray Place 2 sprays into the nose daily as needed. Congestion   Yes Historical Provider, MD  oxazepam (SERAX) 10 MG capsule Take 10 mg by mouth at bedtime as needed. Anxiety   Yes Historical Provider, MD  pravastatin (PRAVACHOL) 20 MG tablet Take 20 mg by mouth daily.   Yes Historical Provider, MD  Pseudoeph-Doxylamine-DM-APAP (NYQUIL D COLD/FLU) 60-12.03-21-999 MG/30ML LIQD Take 30 mLs by mouth every 8 (eight) hours as needed. Cold Symptoms   Yes Historical Provider, MD   Physical Exam: Filed Vitals:   09/01/12 1400 09/01/12 1500 09/01/12 1542 09/01/12 1600  BP:    128/55  Pulse: 115 118  111  Temp:    98.1 F (36.7 C)  TempSrc:    Oral  Resp: 24 24  23   Height:      Weight:      SpO2: 96% 97% 95% 96%     General:  No acute distress  Eyes: Pupils are equal round reactive to light  ENT: Mucous membranes are moist  Neck: Supple  Cardiovascular: S1, S2, tachycardic, no pedal edema bilaterally  Respiratory: Bilateral expiratory wheezes  Abdomen: Soft, nontender, nondistended, bowel sounds are active  Skin: Normal  Musculoskeletal: Deferred  Psychiatric: No anxiety, psychosis  Neurologic: Grossly intact, nonfocal  Labs on Admission:  Basic Metabolic Panel:  Lab 09/01/12 4098  NA 136  K 4.6    CL 97  CO2 34*  GLUCOSE 217*  BUN 19  CREATININE 0.71  CALCIUM 9.5  MG --  PHOS --   Liver Function Tests: No results found for this basename: AST:5,ALT:5,ALKPHOS:5,BILITOT:5,PROT:5,ALBUMIN:5 in the last 168 hours No results found for this basename: LIPASE:5,AMYLASE:5 in the last 168 hours No results found for this basename: AMMONIA:5 in the last 168 hours CBC:  Lab 09/01/12 0823  WBC 12.3*  NEUTROABS 8.9*  HGB 14.1  HCT 43.6  MCV 84.2  PLT 286   Cardiac Enzymes:  Lab 09/01/12 0823  CKTOTAL --  CKMB --  CKMBINDEX --  TROPONINI <0.30    BNP (last 3 results)  Basename 09/01/12 0823  PROBNP 229.4*   CBG: No results found for this basename: GLUCAP:5 in the last 168 hours  Radiological Exams on Admission: Dg Chest Port 1 View  09/01/2012  *RADIOLOGY REPORT*  Clinical Data: Cough.  Rule out infiltrate.  CHF.  Free air.  PORTABLE CHEST - 1 VIEW  Comparison: 06/30/2012  Findings: Cardiomediastinal silhouette is within normal limits. The lungs are free of focal consolidations and pleural effusions. No evidence for pulmonary edema.  No evidence for free intraperitoneal air on this erect view of the chest.  IMPRESSION: No evidence for acute cardiopulmonary abnormality.   Original Report Authenticated By: Norva Pavlov, M.D.     EKG: Independently reviewed. Sinus tachycardia  Assessment/Plan Active Problems:  DM type 2 (diabetes mellitus, type 2)  HTN (hypertension)  Hypothyroidism  Sinus tachycardia  Acute respiratory failure  Respiratory acidosis   1. Acute respiratory failure. Patient reports that she does not have a chronic tobacco history. She had smoked for a few years while she was a teenager, but has not been chronically smoking cigarettes. Its possible that she has underlying asthma. We will start on empiric steroids, nebulizer treatments, antibiotics. She reports that once her prednisone taper is complete, her symptoms usually recur. She would benefit from  a inhaled corticosteroid and long-acting beta agonist. We will ask case management to assist with medications. She will need formal pulmonary function tests as an outpatient. Patient also reports significant acid reflux symptoms. This may be exacerbating her bronchoconstrictive disease. We will place her on twice a day Protonix. 2. Sinus tachycardia. Likely reactive the underlying pulmonary process  3. Diabetes. We'll start the patient also insulin sliding continue Lantus. 4. Hypothyroidism. Continue levothyroxine   Code Status: Full code Family Communication: Discussed with patient  Disposition Plan: Discharge home once medically improved  Time spent: 50 minutes  MEMON,JEHANZEB [facility] Pager [phone]  If 7PM-7AM, please contact night-coverage [email or web address] Password [facility] 09/01/2012, 4:58 PM

## 2012-09-01 NOTE — ED Notes (Signed)
RT at bedside.

## 2012-09-01 NOTE — ED Notes (Signed)
Assisted pt up to bedside commode 

## 2012-09-01 NOTE — ED Provider Notes (Signed)
History     CSN: [id number]  Arrival date & time 09/01/12  4098   First MD Initiated Contact with Patient 09/01/12 0802      Chief Complaint  Patient presents with  . Shortness of Breath     Patient is a 56 y.o. female presenting with shortness of breath. The history is provided by the patient and the EMS personnel. The history is limited by the condition of the patient (urgent need for intervention).  Shortness of Breath  Associated symptoms include shortness of breath.  Pt was seen at 0800.  Per EMS, pt and family report, c/o gradual onset and worsening of persistent SOB and cough for the past several weeks, worse over the past several days. EMS gave neb and solumedrol en route with minimal relief.  Describes similar symptom approx 1-2 months ago, was admitted, dx "bronchitis."  Denies CP/palpitations, no fevers, no abd pain, no N/V/D.       Past Medical History  Diagnosis Date  . Seasonal allergies   . Depression   . Diabetes mellitus   . GERD (gastroesophageal reflux disease)   . Hyperlipidemia   . Hypertension   . Hypothyroidism   . FATTY LIVER DISEASE 08/05/2008  . PERSONAL HX COLONIC POLYPS 09/01/2008  . TRANSAMINASES, SERUM, ELEVATED 08/05/2008  . Obesity, Class III, BMI 40-49.9 (morbid obesity)   . Bronchitis with bronchospasm     Past Surgical History  Procedure Date  . Mandible surgery 1988    realignment  . Nose surgery 2007    sinus infections    Family History  Problem Relation Age of Onset  . Colon cancer Neg Hx   . Esophageal cancer Neg Hx   . Rectal cancer Neg Hx   . Stomach cancer Neg Hx     History  Substance Use Topics  . Smoking status: Former Smoker    Quit date: 10/24/1991  . Smokeless tobacco: Never Used  . Alcohol Use: No      Review of Systems  Unable to perform ROS: Unstable vital signs  Respiratory: Positive for shortness of breath.     Allergies  Codeine  Home Medications   Current Outpatient Rx  Name  Route  Sig   Dispense  Refill  . ACETAMINOPHEN 325 MG PO TABS   Oral   Take 2 tablets (650 mg total) by mouth every 6 (six) hours as needed for pain or fever.         . ALBUTEROL SULFATE HFA 108 (90 BASE) MCG/ACT IN AERS   Inhalation   Inhale 2 puffs into the lungs every 6 (six) hours as needed. Shortness of Breath/ Tightness in Chest         . ALBUTEROL SULFATE (2.5 MG/3ML) 0.083% IN NEBU   Nebulization   Take 2.5 mg by nebulization every 6 (six) hours as needed. Shortness of Breath/ Tightness in Chest         . ASPIRIN EC 81 MG PO TBEC   Oral   Take 81 mg by mouth daily.         Marland Kitchen CETIRIZINE HCL 10 MG PO TABS   Oral   Take 10 mg by mouth daily.         Marland Kitchen CIPROFLOXACIN-HYDROCORTISONE 0.2-1 % OT SUSP   Both Ears   Place 3 drops into both ears daily as needed. Ear Pain         . CITALOPRAM HYDROBROMIDE 40 MG PO TABS   Oral   Take 40 mg  by mouth daily.         Marland Kitchen DM-GUAIFENESIN ER 30-600 MG PO TB12   Oral   Take 1 tablet by mouth 2 (two) times daily as needed. Cough Symptoms         . FLUTICASONE-SALMETEROL 250-50 MCG/DOSE IN AEPB   Inhalation   Inhale 1 puff into the lungs 2 (two) times daily.   60 each      . INSULIN GLARGINE 100 UNIT/ML [place] SOLN   Subcutaneous   Inject 30 Units into the skin at bedtime.         . INSULIN LISPRO (HUMAN) 100 UNIT/ML [place] SOLN   Subcutaneous   Inject into the skin 3 (three) times daily as needed. Patient uses according to a sliding scale at home. Only uses it when sugar is high.         Marland Kitchen LEVOTHYROXINE SODIUM 175 MCG PO TABS   Oral   Take 175 mcg by mouth daily.         Marland Kitchen LISINOPRIL-HYDROCHLOROTHIAZIDE 10-12.5 MG PO TABS   Oral   Take 1 tablet by mouth daily.         Marland Kitchen METFORMIN HCL 1000 MG PO TABS   Oral   Take 1,000 mg by mouth 2 (two) times daily with a meal.         . MOMETASONE FUROATE 50 MCG/ACT NA SUSP   Nasal   Place 2 sprays into the nose daily as needed. Congestion         . OXAZEPAM 10 MG PO CAPS    Oral   Take 10 mg by mouth at bedtime as needed. Anxiety         . PRAVASTATIN SODIUM 20 MG PO TABS   Oral   Take 20 mg by mouth daily.         Marland Kitchen PREDNISONE 10 MG PO TABS      4 tablets daily for 2 days, then decrease by 1 tablet every 2 days until off   20 tablet   0   . PSEUDOEPH-DOXYLAMINE-DM-APAP 60-12.03-21-999 MG/30ML PO LIQD   Oral   Take 30 mLs by mouth every 8 (eight) hours as needed. Cold Symptoms           BP 171/66  Pulse 125  Temp 98.2 F (36.8 C) (Oral)  Resp 30  Ht 5\' 5"  (1.651 m)  Wt 248 lb (112.492 kg)  BMI 41.27 kg/m2  SpO2 95%  Physical Exam 0805: Physical examination:  Nursing notes reviewed; Vital signs and O2 SAT reviewed;  Constitutional: Well developed, Well nourished, Well hydrated, Uncomfortable appearing; Head:  Normocephalic, atraumatic; Eyes: EOMI, PERRL, No scleral icterus; ENMT: Mouth and pharynx normal, Mucous membranes moist; Neck: Supple, Full range of motion, No lymphadenopathy; Cardiovascular: Regular rate and rhythm, No gallop; Respiratory: Breath sounds diminished & equal bilaterally, bilat insp/exp and audible wheezes.  Speaking in short phrases. Tachypneic, +access muscle use.; Chest: Nontender, Movement normal; Abdomen: Soft, Nontender, Nondistended, Normal bowel sounds;; Extremities: Pulses normal, No tenderness, No edema, No calf edema or asymmetry.; Neuro: AA&Ox3, Major CN grossly intact.  Speech clear. No gross focal motor or sensory deficits in extremities.; Skin: Color normal, Warm, Dry.   ED Course  Procedures    0810:  Pt tachypneic, tachycardic, hypoxic.  Sitting upright, talking in short phrases with audible wheezing.  Already given IV solumedrol, will dose continuous neb and start bipap.  0915:  Starting to relax while on bipap.  Awake/alert, smiling.  More in  sync with the bipap, taking deeper breaths.  States she is starting to feel "better."  Will continue to monitor.   1010:  Pt states she continues to feel  "better" on bipap after continuous neb.  Sats remain 99-100%, speaking in sentences, decreased tachypnea and access mm use.  Dx and testing d/w pt and family.  Questions answered.  Verb understanding, agreeable to admit. T/C to [facility]. Kerry Hough, case discussed, including:  HPI, pertinent PM/SHx, VS/PE, dx testing, ED course and treatment:  Agreeable to admit, requests to obtain stepdown bed to team 2.   MDM  MDM Reviewed: previous chart, nursing note and vitals Reviewed previous: ECG and labs Interpretation: ECG, labs and x-ray Total time providing critical care: 30-74 minutes. This excludes time spent performing separately reportable procedures and services. Consults: admitting MD     CRITICAL CARE Performed by: Laray Anger Total critical care time: 60 Critical care time was exclusive of separately billable procedures and treating other patients. Critical care was necessary to treat or prevent imminent or life-threatening deterioration. Critical care was time spent personally by me on the following activities: development of treatment plan with patient and/or surrogate as well as nursing, discussions with consultants, evaluation of patient's response to treatment, examination of patient, obtaining history from patient or surrogate, ordering and performing treatments and interventions, ordering and review of laboratory studies, ordering and review of radiographic studies, pulse oximetry and re-evaluation of patient's condition.    Date: 09/01/2012  Rate: 120  Rhythm: sinus tachycardia  QRS Axis: normal  Intervals: normal  ST/T Wave abnormalities: normal  Conduction Disutrbances:none  Narrative Interpretation:   Old EKG Reviewed: unchanged; no significant changes from previous EKG dated 06/29/2012.   Results for orders placed during the hospital encounter of 09/01/12  BASIC METABOLIC PANEL      Component Value Range   Sodium 136  135 - 145 mEq/L   Potassium 4.6  3.5 - 5.1 mEq/L    Chloride 97  96 - 112 mEq/L   CO2 34 (*) 19 - 32 mEq/L   Glucose, Bld 217 (*) 70 - 99 mg/dL   BUN 19  6 - 23 mg/dL   Creatinine, Ser 1.61  0.50 - 1.10 mg/dL   Calcium 9.5  8.4 - 09.6 mg/dL   GFR calc non Af Amer >90  >90 mL/min   GFR calc Af Amer >90  >90 mL/min  CBC WITH DIFFERENTIAL      Component Value Range   WBC 12.3 (*) 4.0 - 10.5 K/uL   RBC 5.18 (*) 3.87 - 5.11 MIL/uL   Hemoglobin 14.1  12.0 - 15.0 g/dL   HCT 04.5  40.9 - 81.1 %   MCV 84.2  78.0 - 100.0 fL   MCH 27.2  26.0 - 34.0 pg   MCHC 32.3  30.0 - 36.0 g/dL   RDW 91.4  78.2 - 95.6 %   Platelets 286  150 - 400 K/uL   Neutrophils Relative 72  43 - 77 %   Neutro Abs 8.9 (*) 1.7 - 7.7 K/uL   Lymphocytes Relative 18  12 - 46 %   Lymphs Abs 2.2  0.7 - 4.0 K/uL   Monocytes Relative 4  3 - 12 %   Monocytes Absolute 0.5  0.1 - 1.0 K/uL   Eosinophils Relative 6 (*) 0 - 5 %   Eosinophils Absolute 0.7  0.0 - 0.7 K/uL   Basophils Relative 1  0 - 1 %   Basophils Absolute 0.1  0.0 - 0.1 K/uL  PRO B NATRIURETIC PEPTIDE      Component Value Range   Pro B Natriuretic peptide (BNP) 229.4 (*) 0 - 125 pg/mL  TROPONIN I      Component Value Range   Troponin I <0.30  <0.30 ng/mL  BLOOD GAS, ARTERIAL      Component Value Range   FIO2 30.00     Delivery systems BILEVEL POSITIVE AIRWAY PRESSURE     Mode BILEVEL POSITIVE AIRWAY PRESSURE     Inspiratory PAP 12     Expiratory PAP 6     pH, Arterial 7.253 (*) 7.350 - 7.450   pCO2 arterial 58.3 (*) 35.0 - 45.0 mmHg   pO2, Arterial 81.7  80.0 - 100.0 mmHg   Bicarbonate 24.8 (*) 20.0 - 24.0 mEq/L   TCO2 22.8  0 - 100 mmol/L   Acid-base deficit 1.4  0.0 - 2.0 mmol/L   O2 Saturation 94.4     Patient temperature 37.0     Collection site RIGHT RADIAL     Drawn by COLLECTED BY RT     Sample type ARTERIAL     Allens test (pass/fail) PASS  PASS   Dg Chest Port 1 View 09/01/2012  *RADIOLOGY REPORT*  Clinical Data: Cough.  Rule out infiltrate.  CHF.  Free air.  PORTABLE CHEST - 1 VIEW   Comparison: 06/30/2012  Findings: Cardiomediastinal silhouette is within normal limits. The lungs are free of focal consolidations and pleural effusions. No evidence for pulmonary edema.  No evidence for free intraperitoneal air on this erect view of the chest.  IMPRESSION: No evidence for acute cardiopulmonary abnormality.   Original Report Authenticated By: Norva Pavlov, M.D.           Laray Anger, DO 09/04/12 1421

## 2012-09-01 NOTE — ED Notes (Signed)
Bipap at 12/6 w/30% FIO2.  States she feels she is getting better breaths, but still coughing.

## 2012-09-01 NOTE — ED Notes (Signed)
Report called to Jessica, RN in ICU.

## 2012-09-01 NOTE — ED Notes (Signed)
Patient with c/o shortness of breath for several weeks, worsening over last several days. Two duonebs given by EMS PTA, 125 mg Solu medrol given IV PTA. Expiratory wheezing noted, increased work of breathing noted.

## 2012-09-01 NOTE — ED Notes (Signed)
Pt unable to urinate at this time.  

## 2012-09-02 DIAGNOSIS — J45909 Unspecified asthma, uncomplicated: Secondary | ICD-10-CM

## 2012-09-02 LAB — CBC
Platelets: 242 10*3/uL (ref 150–400)
RBC: 4.5 MIL/uL (ref 3.87–5.11)
WBC: 16.7 10*3/uL — ABNORMAL HIGH (ref 4.0–10.5)

## 2012-09-02 LAB — BASIC METABOLIC PANEL
Calcium: 9.4 mg/dL (ref 8.4–10.5)
GFR calc non Af Amer: >90 mL/min (ref >90)
Potassium: 4.7 mEq/L (ref 3.5–5.1)
Sodium: 139 mEq/L (ref 135–145)

## 2012-09-02 LAB — GLUCOSE, CAPILLARY: Glucose-Capillary: 193 mg/dL — ABNORMAL HIGH (ref 70–99)

## 2012-09-02 MED ORDER — INSULIN GLARGINE 100 UNIT/ML ~~LOC~~ SOLN
35.0000 [IU] | Freq: Every day | SUBCUTANEOUS | Status: DC
  Administered 2012-09-02: 35 [IU] via SUBCUTANEOUS

## 2012-09-02 NOTE — Progress Notes (Signed)
[facility]             Progress Note   Subjective: Breathing is improving today. She has a productive cough. Feels that wheezing is improving.  Objective: Vital signs in last 24 hours: Temp:  [97.9 F (36.6 C)-99.1 F (37.3 C)] 98.2 F (36.8 C) (11/11 0700) Pulse Rate:  [92-122] 93  (11/11 0700) Resp:  [18-28] 22  (11/11 0700) BP: (112-149)/(38-61) 129/59 mmHg (11/11 0600) SpO2:  [93 %-100 %] 97 % (11/11 0824) Weight:  [103 kg (227 lb 1.2 oz)-104.1 kg (229 lb 8 oz)] 104.1 kg (229 lb 8 oz) (11/11 0700) Weight change:  Last BM Date: 09/01/12  Intake/Output from previous day: 11/10 0701 - 11/11 0700 In: 1693.8 [P.O.:960; I.V.:483.8; IV Piggyback:250] Out: 450 [Urine:450]     Physical Exam: General: Alert, awake, oriented x3, in no acute distress. HEENT: No bruits, no goiter. Heart: Regular, tachycardic. Lungs: Diminished breath sounds and bilateral expiratory wheezes Abdomen: Soft, nontender, nondistended, positive bowel sounds. Extremities: No clubbing cyanosis or edema with positive pedal pulses. Neuro: Grossly intact, nonfocal.    Lab Results: Basic Metabolic Panel:  Basename 09/02/12 0453 09/01/12 0823  NA 139 136  K 4.7 4.6  CL 102 97  CO2 28 34*  GLUCOSE 201* 217*  BUN 17 19  CREATININE 0.57 0.71  CALCIUM 9.4 9.5  MG -- --  PHOS -- --   Liver Function Tests: No results found for this basename: AST:2,ALT:2,ALKPHOS:2,BILITOT:2,PROT:2,ALBUMIN:2 in the last 72 hours No results found for this basename: LIPASE:2,AMYLASE:2 in the last 72 hours No results found for this basename: AMMONIA:2 in the last 72 hours CBC:  Basename 09/02/12 0453 09/01/12 0823  WBC 16.7* 12.3*  NEUTROABS -- 8.9*  HGB 12.1 14.1  HCT 37.5 43.6  MCV 83.3 84.2  PLT 242 286   Cardiac Enzymes:  Basename 09/01/12 0823  CKTOTAL --  CKMB --  CKMBINDEX --  TROPONINI <0.30   BNP:  Basename 09/01/12 0823  PROBNP 229.4*   D-Dimer: No results found for this  basename: DDIMER:2 in the last 72 hours CBG:  Basename 09/02/12 0724 09/01/12 2102 09/01/12 1714  GLUCAP 214* 229* 242*   Hemoglobin A1C: No results found for this basename: HGBA1C in the last 72 hours Fasting Lipid Panel: No results found for this basename: CHOL,HDL,LDLCALC,TRIG,CHOLHDL,LDLDIRECT in the last 72 hours Thyroid Function Tests: No results found for this basename: TSH,T4TOTAL,FREET4,T3FREE,THYROIDAB in the last 72 hours Anemia Panel: No results found for this basename: VITAMINB12,FOLATE,FERRITIN,TIBC,IRON,RETICCTPCT in the last 72 hours Coagulation: No results found for this basename: LABPROT:2,INR:2 in the last 72 hours Urine Drug Screen: Drugs of Abuse  No results found for this basename: labopia,  cocainscrnur,  labbenz,  amphetmu,  thcu,  labbarb    Alcohol Level: No results found for this basename: ETH:2 in the last 72 hours Urinalysis:  Basename 09/01/12 1400  COLORURINE YELLOW  LABSPEC >1.030*  PHURINE 5.0  GLUCOSEU >1000*  HGBUR NEGATIVE  BILIRUBINUR NEGATIVE  KETONESUR TRACE*  PROTEINUR NEGATIVE  UROBILINOGEN 0.2  NITRITE NEGATIVE  LEUKOCYTESUR NEGATIVE    Recent Results (from the past 240 hour(s))  MRSA PCR SCREENING     Status: Normal   Collection Time   09/01/12  2:00 PM      Component Value Range Status Comment   MRSA by PCR NEGATIVE  NEGATIVE Final     Studies/Results: Dg Chest Port 1 View  09/01/2012  *RADIOLOGY REPORT*  Clinical Data: Cough.  Rule out infiltrate.  CHF.  Free air.  PORTABLE CHEST - 1 VIEW  Comparison: 06/30/2012  Findings: Cardiomediastinal silhouette is within normal limits. The lungs are free of focal consolidations and pleural effusions. No evidence for pulmonary edema.  No evidence for free intraperitoneal air on this erect view of the chest.  IMPRESSION: No evidence for acute cardiopulmonary abnormality.   Original Report Authenticated By: Norva Pavlov, M.D.     Medications: Scheduled Meds:    . aspirin EC   81 mg Oral Daily  . benzonatate  100 mg Oral TID  . citalopram  40 mg Oral Daily  . enoxaparin (LOVENOX) injection  40 mg Subcutaneous Q24H  . guaiFENesin  600 mg Oral BID  . insulin aspart  0-15 Units Subcutaneous TID WC  . insulin aspart  0-5 Units Subcutaneous QHS  . insulin glargine  30 Units Subcutaneous QHS  . ipratropium  0.5 mg Nebulization Q6H  . levalbuterol  0.63 mg Nebulization Q6H  . levothyroxine  175 mcg Oral Daily  . methylPREDNISolone (SOLU-MEDROL) injection  60 mg Intravenous Q6H  . mometasone-formoterol  2 puff Inhalation BID  . moxifloxacin  400 mg Intravenous Q24H  . pantoprazole  40 mg Oral BID AC  . simvastatin  10 mg Oral q1800  . [DISCONTINUED] albuterol  10 mg Nebulization Once  . [DISCONTINUED] ipratropium  1 mg Nebulization Once  . [DISCONTINUED] levalbuterol  0.63 mg Nebulization Q6H   Continuous Infusions:    . [EXPIRED] sodium chloride 75 mL/hr at 09/02/12 0320  . [DISCONTINUED] sodium chloride 75 mL/hr at 09/01/12 0843   PRN Meds:.acetaminophen, acetaminophen, chlorpheniramine-HYDROcodone, HYDROcodone-acetaminophen, levalbuterol, LORazepam, ondansetron (ZOFRAN) IV, ondansetron, [DISCONTINUED] levalbuterol, [DISCONTINUED] ondansetron  Assessment/Plan:  Principal Problem:  *Acute respiratory failure Active Problems:  DM type 2 (diabetes mellitus, type 2)  HTN (hypertension)  Hypothyroidism  Sinus tachycardia  Respiratory acidosis  Asthma  1. Acute respiratory failure secondary to asthma versus COPD exacerbation. Patient likely has an underlying bronchoconstrictive disease, although she has not had any formal pulmonary function tests.. She has not had chronic tobacco smoke exposure. She is having recurrent episodes of shortness of breath and wheezing requiring daily albuterol treatments. She reports that she does well on steroids, but when the steroids are tapered off, her symptoms recur. She has been started on IV steroids, nebulizer treatments,  antibiotics. She is slowly progressing, although still requiring supplemental oxygen. We will continue current treatments for now. I have asked case management to assist with obtaining medications. She will likely benefit from a long-acting beta agonist as well as inhaled corticosteroid for the time being. She will need formal pulmonary function tests once her acute illness has improved.  2. Respiratory acidosis. This has clinically improved. The patient does not have any signs of somnolence or lethargy.  3. Diabetes. Increase Lantus since her blood sugars are higher on steroids.  4. Hypertension. Continue outpatient regimen.  5. Sinus tachycardia. Likely reactive to underlying pulmonary process. This should improve as her respiratory status improves \  6. Disposition. Anticipate that she'll be in the hospital for another 1-2 days. Will wean off oxygen as tolerated.  Time spent coordinating care:   LOS: 1 day   Cotton Beckley [facility] Pager: [phone] 09/02/2012, 10:21 AM

## 2012-09-02 NOTE — Progress Notes (Signed)
UR Chart Review Completed  

## 2012-09-02 NOTE — Progress Notes (Signed)
Inpatient Diabetes Program Recommendations  AACE/ADA: New Consensus Statement on Inpatient Glycemic Control (2013)  Target Ranges:  Prepandial:   less than 140 mg/dL      Peak postprandial:   less than 180 mg/dL (1-2 hours)      Critically ill patients:  140 - 180 mg/dL   Results for KENDL, ZAVERI (MRN [id number]) as of 09/02/2012 08:31  Ref. Range 09/01/2012 17:14 09/01/2012 21:02 09/02/2012 07:24  Glucose-Capillary Latest Range: 70-99 mg/dL 469 (H) 629 (H) 528 (H)    Inpatient Diabetes Program Recommendations Insulin - Basal: Please consider increasing Lantus to 35 units QHS. Correction (SSI): Please consider increasing to resistant correction since patient is receiving Solumedrol and CBGs have ranged from 201-242 mg/dl over the past 12 hours.  Note: Will continue to follow. Thanks, Orlando Penner, RN, BSN, CCRN Diabetes Coordinator Inpatient Diabetes Program [phone]

## 2012-09-02 NOTE — Clinical Social Work Note (Signed)
CSW received consult for no insurance. Referral made to financial counselor. For further needs, CSW can be reconsulted.   Derenda Fennel, [place] [phone]

## 2012-09-03 ENCOUNTER — Encounter (HOSPITAL_COMMUNITY): Payer: Self-pay

## 2012-09-03 DIAGNOSIS — E872 Acidosis, unspecified: Secondary | ICD-10-CM

## 2012-09-03 DIAGNOSIS — J441 Chronic obstructive pulmonary disease with (acute) exacerbation: Secondary | ICD-10-CM

## 2012-09-03 LAB — BASIC METABOLIC PANEL
BUN: 22 mg/dL (ref 6–23)
CO2: 29 mEq/L (ref 19–32)
Calcium: 9.2 mg/dL (ref 8.4–10.5)
Creatinine, Ser: 0.61 mg/dL (ref 0.50–1.10)
GFR calc non Af Amer: >90 mL/min (ref >90)
Glucose, Bld: 241 mg/dL — ABNORMAL HIGH (ref 70–99)

## 2012-09-03 LAB — GLUCOSE, CAPILLARY

## 2012-09-03 LAB — CBC
HCT: 39.6 % (ref 36.0–46.0)
Hemoglobin: 12.9 g/dL (ref 12.0–15.0)
MCH: 27 pg (ref 26.0–34.0)
MCHC: 32.6 g/dL (ref 30.0–36.0)
MCV: 83 fL (ref 78.0–100.0)
RBC: 4.77 MIL/uL (ref 3.87–5.11)

## 2012-09-03 MED ORDER — LEVOFLOXACIN 500 MG PO TABS: 500.0000 mg | ORAL_TABLET | Freq: Every day | ORAL | Status: DC

## 2012-09-03 MED ORDER — ALBUTEROL SULFATE HFA 108 (90 BASE) MCG/ACT IN AERS: 2.0000 | INHALATION_SPRAY | Freq: Four times a day (QID) | RESPIRATORY_TRACT | Status: DC | PRN

## 2012-09-03 MED ORDER — PREDNISONE 10 MG PO TABS: ORAL_TABLET | ORAL | Status: DC

## 2012-09-03 MED ORDER — ALBUTEROL SULFATE (2.5 MG/3ML) 0.083% IN NEBU: 2.5000 mg | INHALATION_SOLUTION | RESPIRATORY_TRACT | Status: DC | PRN

## 2012-09-03 MED ORDER — BENZONATATE 100 MG PO CAPS: 100.0000 mg | ORAL_CAPSULE | Freq: Three times a day (TID) | ORAL | Status: DC

## 2012-09-03 MED ORDER — ALBUTEROL SULFATE HFA 108 (90 BASE) MCG/ACT IN AERS
2.0000 | INHALATION_SPRAY | RESPIRATORY_TRACT | Status: DC
  Administered 2012-09-03: 2 via RESPIRATORY_TRACT
  Filled 2012-09-03 (×2): qty 6.7

## 2012-09-03 MED ORDER — MOXIFLOXACIN HCL IN NACL 400 MG/250ML IV SOLN
400.0000 mg | INTRAVENOUS | Status: DC
  Filled 2012-09-03: qty 250

## 2012-09-03 MED ORDER — FLUTICASONE-SALMETEROL 250-50 MCG/DOSE IN AEPB: 1.0000 | INHALATION_SPRAY | Freq: Two times a day (BID) | RESPIRATORY_TRACT | Status: DC

## 2012-09-03 NOTE — Progress Notes (Signed)
Inpatient Diabetes Program Recommendations  AACE/ADA: New Consensus Statement on Inpatient Glycemic Control (2013)  Target Ranges:  Prepandial:   less than 140 mg/dL      Peak postprandial:   less than 180 mg/dL (1-2 hours)      Critically ill patients:  140 - 180 mg/dL   Results for Sarah Pope, Sarah Pope (MRN [id number]) as of 09/03/2012 07:53  Ref. Range 09/02/2012 07:24 09/02/2012 11:20 09/02/2012 16:20 09/02/2012 21:28 09/03/2012 07:44  Glucose-Capillary Latest Range: 70-99 mg/dL 696 (H) 295 (H) 284 (H) 225 (H) 234 (H)    Inpatient Diabetes Program Recommendations Insulin - Basal: Please consider increasing Lantus to 40 units QHS. Correction (SSI): Please consider increasing to resistant correction since patient is receiving Solumedrol and CBGs have ranged from 193-241 mg/dl over the past 24 hours.  Note: Will continue to monitor. Thanks, Orlando Penner, RN, BSN, CCRN Diabetes Coordinator Inpatient Diabetes Program [phone]

## 2012-09-03 NOTE — Progress Notes (Signed)
Physician Discharge Summary  Sarah Pope RUE:[id number] DOB: [birth year] DOA: 09/01/2012  PCP: [name], MD  Admit date: 09/01/2012 Discharge date: 09/03/2012  Time spent: Greater than 30 minutes  Recommendations for Outpatient Follow-up:  1. The patient was instructed to followup with her primary care physician [name] in one week.  Discharge Diagnoses:  1. COPD exacerbation. 2. Acute hypercapnic respiratory failure with respiratory acidosis, secondary to COPD exacerbation. 3. History of asthma. 4. Type 2 diabetes mellitus. 5. Hypertension. 6. Sinus tachycardia, secondary to respiratory distress and bronchodilators. 7. Leukocytosis secondary to COPD exacerbation and steroid therapy. 8. Hypothyroidism.  Discharge Condition: Improved.  Diet recommendation: Heart healthy and carbohydrate modified.  Filed Weights   09/01/12 1223 09/02/12 0700 09/03/12 0500  Weight: 103 kg (227 lb 1.2 oz) 104.1 kg (229 lb 8 oz) 104.2 kg (229 lb 11.5 oz)    History of present illness:  The patient is a 56 year old woman with a history significant for asthmatic COPD, who presented to the emergency department on 09/01/2012 with a chief complaint of shortness of breath and wheezing. In route to the emergency department, EMS gave her steroids and nebulizer treatments. During the initial evaluation in the emergency department, she was in respiratory distress. She was placed on BiPAP. Her ABG revealed a pH of 7.25, PCO2 of 58.3, and PO2 of 81.7. Her chest x-ray revealed no acute cardiopulmonary abnormalities. She was admitted for further evaluation and management.  Hospital Course:  As above, the patient was started on treatment with BiPAP, IV Solu-Medrol, albuterol/Atrovent nebulizers, and Avelox. She was continued on these medications. Dulera was added. Oxygen was applied and titrated to proximal to saturations greater than 90%. Her tachycardia was thought to be secondary to respiratory distress  coupled with bronchodilator therapy. Symptomatic treatment was given with as needed Tussionex for cough and Tessalon Perles 3 times daily. Her diabetes was treated with sliding scale NovoLog and Lantus. She was maintained on her other chronic medications.  Over the course of the hospitalization, she improved clinically and symptomatically. She remained afebrile and hemodynamically stable. Her tachycardia virtually resolved. After receiving 2 full days of treatment with IV Solu-Medrol and Avelox, she was discharged on a prednisone taper for the next 8 days and Levaquin for an additional 5 days. She was advised to continue albuterol nebulization or inhaler every 4 hours and to continue Solu-Medrol as previously prescribed. She voiced understanding.  Procedures:  None  Consultations:  None  Discharge Exam: Filed Vitals:   09/03/12 0600 09/03/12 0700 09/03/12 0759 09/03/12 0800  BP: 138/74   156/80  Pulse: 74 87  100  Temp:    98.1 F (36.7 C)  TempSrc:    Oral  Resp:    16  Height:      Weight:      SpO2: 98% 95% 96% 98%    General: Pleasant alert 56 year old Caucasian woman sitting up in bed, in no acute distress. Cardiovascular: S1, S2, no murmurs rubs or gallops. Respiratory: Faint and occasional expiratory wheezes bilaterally. Breathing is nonlabored.  Discharge Instructions      Discharge Orders    Future Orders Please Complete By Expires   Diet - low sodium heart healthy      Diet Carb Modified      Increase activity slowly          Medication List     As of 09/03/2012  9:37 AM    STOP taking these medications  NYQUIL D COLD/FLU 60-12.03-21-999 MG/30ML Liqd   Generic drug: Pseudoeph-Doxylamine-DM-APAP      TAKE these medications         acetaminophen 325 MG tablet   Commonly known as: TYLENOL   Take 2 tablets (650 mg total) by mouth every 6 (six) hours as needed for pain or fever.      albuterol 108 (90 BASE) MCG/ACT inhaler   Commonly known as:  PROVENTIL HFA;VENTOLIN HFA   Inhale 2 puffs into the lungs every 6 (six) hours as needed. Shortness of Breath/ Tightness in Chest      albuterol (2.5 MG/3ML) 0.083% nebulizer solution   Commonly known as: PROVENTIL   Take 3 mLs (2.5 mg total) by nebulization every 4 (four) hours as needed for wheezing or shortness of breath. Shortness of Breath/ Tightness in Chest      aspirin EC 81 MG tablet   Take 81 mg by mouth daily.      benzonatate 100 MG capsule   Commonly known as: TESSALON   Take 1 capsule (100 mg total) by mouth 3 (three) times daily.      cetirizine 10 MG tablet   Commonly known as: ZYRTEC   Take 10 mg by mouth daily.      citalopram 40 MG tablet   Commonly known as: CELEXA   Take 40 mg by mouth daily.      dextromethorphan-guaiFENesin 30-600 MG per 12 hr tablet   Commonly known as: MUCINEX DM   Take 1 tablet by mouth 2 (two) times daily as needed. Cough Symptoms      Fluticasone-Salmeterol 250-50 MCG/DOSE Aepb   Commonly known as: ADVAIR   Inhale 1 puff into the lungs 2 (two) times daily.      insulin glargine 100 UNIT/ML injection   Commonly known as: LANTUS   Inject 30 Units into the skin at bedtime.      insulin lispro 100 UNIT/ML injection   Commonly known as: HUMALOG   Inject into the skin 3 (three) times daily as needed. Patient uses according to a sliding scale at home. Only uses it when sugar is high.      levofloxacin 500 MG tablet   Commonly known as: LEVAQUIN   Take 1 tablet (500 mg total) by mouth daily. START TAKING ANTIBIOTICS TOMORROW, 09/04/2012.      levothyroxine 175 MCG tablet   Commonly known as: SYNTHROID, LEVOTHROID   Take 175 mcg by mouth daily.      lisinopril-hydrochlorothiazide 10-12.5 MG per tablet   Commonly known as: PRINZIDE,ZESTORETIC   Take 1 tablet by mouth daily.      metFORMIN 1000 MG tablet   Commonly known as: GLUCOPHAGE   Take 1,000 mg by mouth 2 (two) times daily with a meal.      mometasone 50 MCG/ACT nasal  spray   Commonly known as: NASONEX   Place 2 sprays into the nose daily as needed. Congestion      oxazepam 10 MG capsule   Commonly known as: SERAX   Take 10 mg by mouth at bedtime as needed. Anxiety      pravastatin 20 MG tablet   Commonly known as: PRAVACHOL   Take 20 mg by mouth daily.      predniSONE 10 MG tablet   Commonly known as: DELTASONE   TAKE 6 TABLETS EACH DAY FOR 2 DAYS; THEN TAKE 5 TABLETS DAILY FOR 2 DAYS; THEN TAKE 4 TABLETS DAILY FOR ONE DAY; THEN TAKE 3 TABLETS FOR 1  DAY; THEN TAKE 2 TABLETS FOR 1 DAY; THEN TAKE 1 TABLET FOR ONE DAY; THEN STOP.        Follow-up Information    Follow up with Lilyan Punt, MD.   Contact information:   [address] [place] [address] [phone]           The results of significant diagnostics from this hospitalization (including imaging, microbiology, ancillary and laboratory) are listed below for reference.    Significant Diagnostic Studies: Dg Chest Port 1 View  09/01/2012  *RADIOLOGY REPORT*  Clinical Data: Cough.  Rule out infiltrate.  CHF.  Free air.  PORTABLE CHEST - 1 VIEW  Comparison: 06/30/2012  Findings: Cardiomediastinal silhouette is within normal limits. The lungs are free of focal consolidations and pleural effusions. No evidence for pulmonary edema.  No evidence for free intraperitoneal air on this erect view of the chest.  IMPRESSION: No evidence for acute cardiopulmonary abnormality.   Original Report Authenticated By: Norva Pavlov, M.D.     Microbiology: Recent Results (from the past 240 hour(s))  URINE CULTURE     Status: Normal (Preliminary result)   Collection Time   09/01/12  1:58 PM      Component Value Range Status Comment   Specimen Description URINE, CATHETERIZED   Final    Special Requests NONE   Final    Culture  Setup Time 09/01/2012 21:04   Final    Colony Count PENDING   Incomplete    Culture Culture reincubated for better growth   Final    Report Status PENDING   Incomplete    MRSA PCR SCREENING     Status: Normal   Collection Time   09/01/12  2:00 PM      Component Value Range Status Comment   MRSA by PCR NEGATIVE  NEGATIVE Final      Labs: Basic Metabolic Panel:  Lab 09/03/12 8295 09/02/12 0453 09/01/12 0823  NA 137 139 136  K 4.7 4.7 4.6  CL 101 102 97  CO2 29 28 34*  GLUCOSE 241* 201* 217*  BUN 22 17 19   CREATININE 0.61 0.57 0.71  CALCIUM 9.2 9.4 9.5  MG -- -- --  PHOS -- -- --   Liver Function Tests: No results found for this basename: AST:5,ALT:5,ALKPHOS:5,BILITOT:5,PROT:5,ALBUMIN:5 in the last 168 hours No results found for this basename: LIPASE:5,AMYLASE:5 in the last 168 hours No results found for this basename: AMMONIA:5 in the last 168 hours CBC:  Lab 09/03/12 0516 09/02/12 0453 09/01/12 0823  WBC 18.5* 16.7* 12.3*  NEUTROABS -- -- 8.9*  HGB 12.9 12.1 14.1  HCT 39.6 37.5 43.6  MCV 83.0 83.3 84.2  PLT 276 242 286   Cardiac Enzymes:  Lab 09/01/12 0823  CKTOTAL --  CKMB --  CKMBINDEX --  TROPONINI <0.30   BNP: BNP (last 3 results)  Basename 09/01/12 0823  PROBNP 229.4*   CBG:  Lab 09/03/12 0744 09/02/12 2128 09/02/12 1620 09/02/12 1120 09/02/12 0724  GLUCAP 234* 225* 220* 193* 214*       Signed:  Bertrum Helmstetter  [facility] 09/03/2012, 9:37 AM

## 2012-09-04 LAB — URINE CULTURE

## 2012-09-06 NOTE — Discharge Summary (Signed)
Physician Discharge Summary  Sarah Pope BJY:[id number] DOB: 1956/01/21 DOA: 09/01/2012  PCP: [name], MD  Admit date: 09/01/2012 Discharge date: 09/06/2012  Time spent: Greater than 30 minutes  Recommendations for Outpatient Follow-up:  1. The patient was instructed to followup with her primary care physician [name] in one week.  Discharge Diagnoses:  1. COPD exacerbation. 2. Acute hypercapnic respiratory failure with respiratory acidosis, secondary to COPD exacerbation. 3. History of asthma. 4. Type 2 diabetes mellitus. 5. Hypertension. 6. Sinus tachycardia, secondary to respiratory distress and bronchodilators. 7. Leukocytosis secondary to COPD exacerbation and steroid therapy. 8. Hypothyroidism.  Discharge Condition: Improved.  Diet recommendation: Heart healthy and carbohydrate modified.  Filed Weights   09/01/12 1223 09/02/12 0700 09/03/12 0500  Weight: 103 kg (227 lb 1.2 oz) 104.1 kg (229 lb 8 oz) 104.2 kg (229 lb 11.5 oz)    History of present illness:  The patient is a 56 year old woman with a history significant for asthmatic COPD, who presented to the emergency department on 09/01/2012 with a chief complaint of shortness of breath and wheezing. In route to the emergency department, EMS gave her steroids and nebulizer treatments. During the initial evaluation in the emergency department, she was in respiratory distress. She was placed on BiPAP. Her ABG revealed a pH of 7.25, PCO2 of 58.3, and PO2 of 81.7. Her chest x-ray revealed no acute cardiopulmonary abnormalities. She was admitted for further evaluation and management.  Hospital Course:  As above, the patient was started on treatment with BiPAP, IV Solu-Medrol, albuterol/Atrovent nebulizers, and Avelox. She was continued on these medications. Dulera was added. Oxygen was applied and titrated to proximal to saturations greater than 90%. Her tachycardia was thought to be secondary to respiratory distress  coupled with bronchodilator therapy. Symptomatic treatment was given with as needed Tussionex for cough and Tessalon Perles 3 times daily. Her diabetes was treated with sliding scale NovoLog and Lantus. She was maintained on her other chronic medications.  Over the course of the hospitalization, she improved clinically and symptomatically. She remained afebrile and hemodynamically stable. Her tachycardia virtually resolved. After receiving 2 full days of treatment with IV Solu-Medrol and Avelox, she was discharged on a prednisone taper for the next 8 days and Levaquin for an additional 5 days. She was advised to continue albuterol nebulization or inhaler every 4 hours and to continue Solu-Medrol as previously prescribed. She voiced understanding.  Procedures:  None  Consultations:  None  Discharge Exam: Filed Vitals:   09/03/12 0700 09/03/12 0759 09/03/12 0800 09/03/12 1214  BP:   156/80   Pulse: 87  100   Temp:   98.1 F (36.7 C)   TempSrc:   Oral   Resp:   16   Height:      Weight:      SpO2: 95% 96% 98% 99%    General: Pleasant alert 56 year old Caucasian woman sitting up in bed, in no acute distress. Cardiovascular: S1, S2, no murmurs rubs or gallops. Respiratory: Faint and occasional expiratory wheezes bilaterally. Breathing is nonlabored.  Discharge Instructions      Discharge Orders    Future Orders Please Complete By Expires   Diet - low sodium heart healthy      Diet Carb Modified      Increase activity slowly          Medication List     As of 09/06/2012  2:08 PM    STOP taking these medications  NYQUIL D COLD/FLU 60-12.03-21-999 MG/30ML Liqd   Generic drug: Pseudoeph-Doxylamine-DM-APAP      TAKE these medications         acetaminophen 325 MG tablet   Commonly known as: TYLENOL   Take 2 tablets (650 mg total) by mouth every 6 (six) hours as needed for pain or fever.      albuterol 108 (90 BASE) MCG/ACT inhaler   Commonly known as: PROVENTIL  HFA;VENTOLIN HFA   Inhale 2 puffs into the lungs every 6 (six) hours as needed. Shortness of Breath/ Tightness in Chest      albuterol (2.5 MG/3ML) 0.083% nebulizer solution   Commonly known as: PROVENTIL   Take 3 mLs (2.5 mg total) by nebulization every 4 (four) hours as needed for wheezing or shortness of breath. Shortness of Breath/ Tightness in Chest      aspirin EC 81 MG tablet   Take 81 mg by mouth daily.      benzonatate 100 MG capsule   Commonly known as: TESSALON   Take 1 capsule (100 mg total) by mouth 3 (three) times daily.      cetirizine 10 MG tablet   Commonly known as: ZYRTEC   Take 10 mg by mouth daily.      citalopram 40 MG tablet   Commonly known as: CELEXA   Take 40 mg by mouth daily.      dextromethorphan-guaiFENesin 30-600 MG per 12 hr tablet   Commonly known as: MUCINEX DM   Take 1 tablet by mouth 2 (two) times daily as needed. Cough Symptoms      Fluticasone-Salmeterol 250-50 MCG/DOSE Aepb   Commonly known as: ADVAIR   Inhale 1 puff into the lungs 2 (two) times daily.      insulin glargine 100 UNIT/ML injection   Commonly known as: LANTUS   Inject 30 Units into the skin at bedtime.      insulin lispro 100 UNIT/ML injection   Commonly known as: HUMALOG   Inject into the skin 3 (three) times daily as needed. Patient uses according to a sliding scale at home. Only uses it when sugar is high.      levofloxacin 500 MG tablet   Commonly known as: LEVAQUIN   Take 1 tablet (500 mg total) by mouth daily. START TAKING ANTIBIOTICS TOMORROW, 09/04/2012.      levothyroxine 175 MCG tablet   Commonly known as: SYNTHROID, LEVOTHROID   Take 175 mcg by mouth daily.      lisinopril-hydrochlorothiazide 10-12.5 MG per tablet   Commonly known as: PRINZIDE,ZESTORETIC   Take 1 tablet by mouth daily.      metFORMIN 1000 MG tablet   Commonly known as: GLUCOPHAGE   Take 1,000 mg by mouth 2 (two) times daily with a meal.      mometasone 50 MCG/ACT nasal spray    Commonly known as: NASONEX   Place 2 sprays into the nose daily as needed. Congestion      oxazepam 10 MG capsule   Commonly known as: SERAX   Take 10 mg by mouth at bedtime as needed. Anxiety      pravastatin 20 MG tablet   Commonly known as: PRAVACHOL   Take 20 mg by mouth daily.      predniSONE 10 MG tablet   Commonly known as: DELTASONE   TAKE 6 TABLETS EACH DAY FOR 2 DAYS; THEN TAKE 5 TABLETS DAILY FOR 2 DAYS; THEN TAKE 4 TABLETS DAILY FOR ONE DAY; THEN TAKE 3 TABLETS FOR 1  DAY; THEN TAKE 2 TABLETS FOR 1 DAY; THEN TAKE 1 TABLET FOR ONE DAY; THEN STOP.         Follow-up Information    Follow up with Lilyan Punt, MD.   Contact information:   [address] [place] [address] [phone]           The results of significant diagnostics from this hospitalization (including imaging, microbiology, ancillary and laboratory) are listed below for reference.    Significant Diagnostic Studies: Dg Chest Port 1 View  09/01/2012  *RADIOLOGY REPORT*  Clinical Data: Cough.  Rule out infiltrate.  CHF.  Free air.  PORTABLE CHEST - 1 VIEW  Comparison: 06/30/2012  Findings: Cardiomediastinal silhouette is within normal limits. The lungs are free of focal consolidations and pleural effusions. No evidence for pulmonary edema.  No evidence for free intraperitoneal air on this erect view of the chest.  IMPRESSION: No evidence for acute cardiopulmonary abnormality.   Original Report Authenticated By: Norva Pavlov, M.D.     Microbiology: Recent Results (from the past 240 hour(s))  URINE CULTURE     Status: Normal   Collection Time   09/01/12  1:58 PM      Component Value Range Status Comment   Specimen Description URINE, CATHETERIZED   Final    Special Requests NONE   Final    Culture  Setup Time 09/01/2012 21:04   Final    Colony Count 20,OOO COLONIES/ML   Final    Culture ENTEROCOCCUS SPECIES   Final    Report Status 09/04/2012 FINAL   Final    Organism ID, Bacteria  ENTEROCOCCUS SPECIES   Final   MRSA PCR SCREENING     Status: Normal   Collection Time   09/01/12  2:00 PM      Component Value Range Status Comment   MRSA by PCR NEGATIVE  NEGATIVE Final      Labs: Basic Metabolic Panel:  Lab 09/03/12 9147 09/02/12 0453 09/01/12 0823  NA 137 139 136  K 4.7 4.7 4.6  CL 101 102 97  CO2 29 28 34*  GLUCOSE 241* 201* 217*  BUN 22 17 19   CREATININE 0.61 0.57 0.71  CALCIUM 9.2 9.4 9.5  MG -- -- --  PHOS -- -- --   Liver Function Tests: No results found for this basename: AST:5,ALT:5,ALKPHOS:5,BILITOT:5,PROT:5,ALBUMIN:5 in the last 168 hours No results found for this basename: LIPASE:5,AMYLASE:5 in the last 168 hours No results found for this basename: AMMONIA:5 in the last 168 hours CBC:  Lab 09/03/12 0516 09/02/12 0453 09/01/12 0823  WBC 18.5* 16.7* 12.3*  NEUTROABS -- -- 8.9*  HGB 12.9 12.1 14.1  HCT 39.6 37.5 43.6  MCV 83.0 83.3 84.2  PLT 276 242 286   Cardiac Enzymes:  Lab 09/01/12 0823  CKTOTAL --  CKMB --  CKMBINDEX --  TROPONINI <0.30   BNP: BNP (last 3 results)  Basename 09/01/12 0823  PROBNP 229.4*   CBG:  Lab 09/03/12 1155 09/03/12 0744 09/02/12 2128 09/02/12 1620 09/02/12 1120  GLUCAP 280* 234* 225* 220* 193*       Signed:  Eman Morimoto  [facility] 09/06/2012, 2:08 PM

## 2012-10-08 ENCOUNTER — Encounter: Payer: Self-pay | Admitting: Critical Care Medicine

## 2012-10-09 ENCOUNTER — Ambulatory Visit (INDEPENDENT_AMBULATORY_CARE_PROVIDER_SITE_OTHER): Payer: Self-pay | Admitting: Critical Care Medicine

## 2012-10-09 ENCOUNTER — Encounter: Payer: Self-pay | Admitting: Critical Care Medicine

## 2012-10-09 VITALS — BP 140/76 | HR 91 | Temp 97.8°F | Ht 65.0 in | Wt 237.0 lb

## 2012-10-09 DIAGNOSIS — J45909 Unspecified asthma, uncomplicated: Secondary | ICD-10-CM

## 2012-10-09 DIAGNOSIS — R0600 Dyspnea, unspecified: Secondary | ICD-10-CM

## 2012-10-09 DIAGNOSIS — R0609 Other forms of dyspnea: Secondary | ICD-10-CM

## 2012-10-09 MED ORDER — DEXTROMETHORPHAN POLISTIREX 30 MG/5ML PO LQCR: ORAL | Status: DC

## 2012-10-09 MED ORDER — LOSARTAN POTASSIUM-HCTZ 50-12.5 MG PO TABS: 1.0000 | ORAL_TABLET | Freq: Every day | ORAL | Status: DC

## 2012-10-09 MED ORDER — MOMETASONE FUROATE 50 MCG/ACT NA SUSP: 2.0000 | Freq: Every day | NASAL | Status: DC

## 2012-10-09 MED ORDER — BUDESONIDE 180 MCG/ACT IN AEPB: 2.0000 | INHALATION_SPRAY | Freq: Two times a day (BID) | RESPIRATORY_TRACT | Status: DC

## 2012-10-09 NOTE — Progress Notes (Signed)
Subjective:    Patient ID: Sarah Pope, female    DOB: [birth year], 56 y.o.   MRN: [id number]  HPI Comments: ? Asthma dx.  Symptoms for 3-4 months.  Pt notes dyspnea to point of hospital stays x 2 in two months.  Issues at rest and exertion  Asthma She complains of chest tightness, cough, difficulty breathing, frequent throat clearing, hoarse voice, shortness of breath, sputum production and wheezing. There is no hemoptysis. Primary symptoms comments: Mucus is yellow. This is a new problem. The current episode started more than 1 month ago. The problem occurs daily. The problem has been gradually improving. The cough is productive of purulent sputum, dry, barking, croupy, harsh, productive, hoarse, nocturnal, paroxysmal and supine. Associated symptoms include chest pain, dyspnea on exertion, headaches, malaise/fatigue, orthopnea, PND, postnasal drip, sneezing, a sore throat and trouble swallowing. Pertinent negatives include no appetite change, ear congestion, ear pain, fever, myalgias, nasal congestion, rhinorrhea, sweats or weight loss. Her symptoms are aggravated by eating, climbing stairs, any activity, exercise, change in weather, lying down, exposure to smoke, exposure to fumes, emotional stress and strenuous activity. Her symptoms are alleviated by steroid inhaler, oral steroids and beta-agonist. She reports significant improvement on treatment. Her past medical history is significant for asthma. There is no history of bronchiectasis, bronchitis, COPD, emphysema or pneumonia.    Past Medical History  Diagnosis Date  . Seasonal allergies   . Depression   . DM type 2 (diabetes mellitus, type 2)   . GERD (gastroesophageal reflux disease)   . Hyperlipidemia   . Hypertension   . Hypothyroidism   . FATTY LIVER DISEASE 08/05/2008  . PERSONAL HX COLONIC POLYPS 09/01/2008  . TRANSAMINASES, SERUM, ELEVATED 08/05/2008  . Obesity, Class III, BMI 40-49.9 (morbid obesity)   . Bronchitis with  bronchospasm   . Leukocytosis   . Acute respiratory failure with hypercapnia      Family History  Problem Relation Age of Onset  . Colon cancer Neg Hx   . Esophageal cancer Neg Hx   . Rectal cancer Neg Hx   . Stomach cancer Neg Hx   . Allergies Daughter   . Breast cancer Mother   . Prostate cancer Father      History   Social History  . Marital Status: Divorced    Spouse Name: N/A    Number of Children: N/A  . Years of Education: N/A   Occupational History  . Lead Clerk at [employer]    Social History Main Topics  . Smoking status: Former Smoker -- 0.5 packs/day for 5 years    Types: Cigarettes    Quit date: 10/23/1982  . Smokeless tobacco: Never Used  . Alcohol Use: No  . Drug Use: No  . Sexually Active: Not on file   Other Topics Concern  . Not on file   Social History Narrative  . No narrative on file     Allergies  Allergen Reactions  . Codeine Nausea Only  . Sulfa Antibiotics     itching     Outpatient Prescriptions Prior to Visit  Medication Sig Dispense Refill  . acetaminophen (TYLENOL) 325 MG tablet Take 2 tablets (650 mg total) by mouth every 6 (six) hours as needed for pain or fever.      Marland Kitchen albuterol (PROVENTIL HFA;VENTOLIN HFA) 108 (90 BASE) MCG/ACT inhaler Inhale 2 puffs into the lungs every 6 (six) hours as needed. Shortness of Breath/ Tightness in Chest  1 Inhaler  6  . albuterol (  PROVENTIL) (2.5 MG/3ML) 0.083% nebulizer solution Take 3 mLs (2.5 mg total) by nebulization every 4 (four) hours as needed for wheezing or shortness of breath. Shortness of Breath/ Tightness in Chest  75 mL  3  . aspirin EC 81 MG tablet Take 81 mg by mouth daily.      . benzonatate (TESSALON) 100 MG capsule Take 1 capsule (100 mg total) by mouth 3 (three) times daily.  20 capsule  0  . cetirizine (ZYRTEC) 10 MG tablet Take 10 mg by mouth daily.      . citalopram (CELEXA) 40 MG tablet Take 40 mg by mouth daily.      Marland Kitchen dextromethorphan-guaiFENesin (MUCINEX DM)  30-600 MG per 12 hr tablet Take 1 tablet by mouth 2 (two) times daily as needed. Cough Symptoms      . insulin glargine (LANTUS) 100 UNIT/ML injection Inject 30 Units into the skin at bedtime.      . insulin lispro (HUMALOG) 100 UNIT/ML injection Inject into the skin 3 (three) times daily as needed. Patient uses according to a sliding scale at home. Only uses it when sugar is high.      . levothyroxine (SYNTHROID, LEVOTHROID) 175 MCG tablet Take 175 mcg by mouth daily.      . metFORMIN (GLUCOPHAGE) 1000 MG tablet Take 1,000 mg by mouth 2 (two) times daily with a meal.      . oxazepam (SERAX) 10 MG capsule Take 10 mg by mouth at bedtime as needed. Anxiety      . pravastatin (PRAVACHOL) 20 MG tablet Take 20 mg by mouth daily.      . [DISCONTINUED] Fluticasone-Salmeterol (ADVAIR) 250-50 MCG/DOSE AEPB Inhale 1 puff into the lungs 2 (two) times daily.  60 each  3  . [DISCONTINUED] lisinopril-hydrochlorothiazide (PRINZIDE,ZESTORETIC) 10-12.5 MG per tablet Take 1 tablet by mouth daily.      . [DISCONTINUED] mometasone (NASONEX) 50 MCG/ACT nasal spray Place 2 sprays into the nose daily as needed. Congestion      . [DISCONTINUED] levofloxacin (LEVAQUIN) 500 MG tablet Take 1 tablet (500 mg total) by mouth daily. START TAKING ANTIBIOTICS TOMORROW, 09/04/2012.  5 tablet  0  . [DISCONTINUED] predniSONE (DELTASONE) 10 MG tablet TAKE 6 TABLETS EACH DAY FOR 2 DAYS; THEN TAKE 5 TABLETS DAILY FOR 2 DAYS; THEN TAKE 4 TABLETS DAILY FOR ONE DAY; THEN TAKE 3 TABLETS FOR 1 DAY; THEN TAKE 2 TABLETS FOR 1 DAY; THEN TAKE 1 TABLET FOR ONE DAY; THEN STOP.  32 tablet  0   Last reviewed on 10/09/2012 10:29 AM by Storm Frisk, MD    Review of Systems  Constitutional: Positive for malaise/fatigue and fatigue. Negative for fever, chills, weight loss, diaphoresis, activity change, appetite change and unexpected weight change.  HENT: Positive for nosebleeds, congestion, sore throat, hoarse voice, sneezing, trouble swallowing,  voice change, postnasal drip, sinus pressure and tinnitus. Negative for hearing loss, ear pain, facial swelling, rhinorrhea, mouth sores, neck pain, neck stiffness, dental problem and ear discharge.   Eyes: Positive for discharge and itching. Negative for photophobia and visual disturbance.  Respiratory: Positive for cough, sputum production, choking, shortness of breath and wheezing. Negative for apnea, hemoptysis, chest tightness and stridor.   Cardiovascular: Positive for chest pain, dyspnea on exertion and PND. Negative for palpitations and leg swelling.  Gastrointestinal: Negative for nausea, vomiting, abdominal pain, constipation, blood in stool and abdominal distention.       Hx of heartburn  Genitourinary: Negative for dysuria, urgency, frequency, hematuria, flank pain,  decreased urine volume and difficulty urinating.  Musculoskeletal: Positive for back pain. Negative for myalgias, joint swelling, arthralgias and gait problem.  Skin: Positive for rash. Negative for color change and pallor.  Neurological: Positive for headaches. Negative for dizziness, tremors, seizures, syncope, speech difficulty, weakness, light-headedness and numbness.  Hematological: Negative for adenopathy. Does not bruise/bleed easily.  Psychiatric/Behavioral: Negative for confusion, sleep disturbance and agitation. The patient is nervous/anxious.        Objective:   Physical Exam Filed Vitals:   10/09/12 1013  BP: 140/76  Pulse: 91  Temp: 97.8 F (36.6 C)  TempSrc: Oral  Height: 5\' 5"  (1.651 m)  Weight: 237 lb (107.502 kg)  SpO2: 97%    Gen: Pleasant, well-nourished, in no distress,  normal affect  ENT: No lesions,  mouth clear,  oropharynx clear, ++ postnasal drip  Neck: No JVD, no TMG, no carotid bruits  Lungs: No use of accessory muscles, no dullness to percussion, upper airway pseudo-wheeze  Cardiovascular: RRR, heart sounds normal, no murmur or gallops, no peripheral edema  Abdomen: soft and  NT, no HSM,  BS normal  Musculoskeletal: No deformities, no cyanosis or clubbing  Neuro: alert, non focal  Skin: Warm, no lesions or rashes  Chest x-ray reviewed and shows low lung volumes in no acute infiltrates from November 2013  Spirometry 09/09/2012: FEV1 71% FVC 77% FEV1 FEC ratio 76% FEF 25 7566% consistent with restriction     Assessment & Plan:   Asthma Moderate persistent asthma without evidence of chronic obstructive lung disease at this time No evidence of emphysema Associated cyclic cough with upper airway instability Recent acute tracheobronchitis with  acute hypoxemia ACE inhibitor associated cough Plan Stop advair Start pulmicort two puff twice daily, use samples Follow cough protocol with delsym/tessalon Strict reflux diet Protonix daily Stop lisinopril hctz Start losartan hctz Return 2 weeks for recheck with tammy parrett     Updated Medication List Outpatient Encounter Prescriptions as of 10/09/2012  Medication Sig Dispense Refill  . acetaminophen (TYLENOL) 325 MG tablet Take 2 tablets (650 mg total) by mouth every 6 (six) hours as needed for pain or fever.      Marland Kitchen albuterol (PROVENTIL HFA;VENTOLIN HFA) 108 (90 BASE) MCG/ACT inhaler Inhale 2 puffs into the lungs every 6 (six) hours as needed. Shortness of Breath/ Tightness in Chest  1 Inhaler  6  . albuterol (PROVENTIL) (2.5 MG/3ML) 0.083% nebulizer solution Take 3 mLs (2.5 mg total) by nebulization every 4 (four) hours as needed for wheezing or shortness of breath. Shortness of Breath/ Tightness in Chest  75 mL  3  . aspirin EC 81 MG tablet Take 81 mg by mouth daily.      . benzonatate (TESSALON) 100 MG capsule Take 1 capsule (100 mg total) by mouth 3 (three) times daily.  20 capsule  0  . cetirizine (ZYRTEC) 10 MG tablet Take 10 mg by mouth daily.      . citalopram (CELEXA) 40 MG tablet Take 40 mg by mouth daily.      Marland Kitchen dextromethorphan-guaiFENesin (MUCINEX DM) 30-600 MG per 12 hr tablet Take 1 tablet  by mouth 2 (two) times daily as needed. Cough Symptoms      . insulin glargine (LANTUS) 100 UNIT/ML injection Inject 30 Units into the skin at bedtime.      . insulin lispro (HUMALOG) 100 UNIT/ML injection Inject into the skin 3 (three) times daily as needed. Patient uses according to a sliding scale at home. Only uses it when  sugar is high.      . levothyroxine (SYNTHROID, LEVOTHROID) 175 MCG tablet Take 175 mcg by mouth daily.      . metFORMIN (GLUCOPHAGE) 1000 MG tablet Take 1,000 mg by mouth 2 (two) times daily with a meal.      . mometasone (NASONEX) 50 MCG/ACT nasal spray Place 2 sprays into the nose daily. Congestion  17 g  6  . oxazepam (SERAX) 10 MG capsule Take 10 mg by mouth at bedtime as needed. Anxiety      . pantoprazole (PROTONIX) 40 MG tablet Take 40 mg by mouth daily.      . pravastatin (PRAVACHOL) 20 MG tablet Take 20 mg by mouth daily.      . [DISCONTINUED] Fluticasone-Salmeterol (ADVAIR) 250-50 MCG/DOSE AEPB Inhale 1 puff into the lungs 2 (two) times daily.  60 each  3  . [DISCONTINUED] lisinopril-hydrochlorothiazide (PRINZIDE,ZESTORETIC) 10-12.5 MG per tablet Take 1 tablet by mouth daily.      . [DISCONTINUED] mometasone (NASONEX) 50 MCG/ACT nasal spray Place 2 sprays into the nose daily as needed. Congestion      . budesonide (PULMICORT FLEXHALER) 180 MCG/ACT inhaler Inhale 2 puffs into the lungs 2 (two) times daily.  1 Inhaler  6  . dextromethorphan (DELSYM) 30 MG/5ML liquid Use per cough protocol  89 mL  0  . losartan-hydrochlorothiazide (HYZAAR) 50-12.5 MG per tablet Take 1 tablet by mouth daily.  30 tablet  6  . [DISCONTINUED] levofloxacin (LEVAQUIN) 500 MG tablet Take 1 tablet (500 mg total) by mouth daily. START TAKING ANTIBIOTICS TOMORROW, 09/04/2012.  5 tablet  0  . [DISCONTINUED] predniSONE (DELTASONE) 10 MG tablet TAKE 6 TABLETS EACH DAY FOR 2 DAYS; THEN TAKE 5 TABLETS DAILY FOR 2 DAYS; THEN TAKE 4 TABLETS DAILY FOR ONE DAY; THEN TAKE 3 TABLETS FOR 1 DAY; THEN TAKE 2  TABLETS FOR 1 DAY; THEN TAKE 1 TABLET FOR ONE DAY; THEN STOP.  32 tablet  0

## 2012-10-09 NOTE — Assessment & Plan Note (Signed)
Moderate persistent asthma without evidence of chronic obstructive lung disease at this time No evidence of emphysema Associated cyclic cough with upper airway instability Recent acute tracheobronchitis with  acute hypoxemia ACE inhibitor associated cough Plan Stop advair Start pulmicort two puff twice daily, use samples Follow cough protocol with delsym/tessalon Strict reflux diet Protonix daily Stop lisinopril hctz Start losartan hctz Return 2 weeks for recheck with tammy parrett

## 2012-10-09 NOTE — Patient Instructions (Addendum)
Stop advair Start pulmicort two puff twice daily, use samples Follow cough protocol with delsym/tessalon Strict reflux diet Protonix daily Stop lisinopril hctz Start losartan hctz Return 2 weeks for recheck with tammy parrett

## 2012-10-20 ENCOUNTER — Emergency Department (HOSPITAL_COMMUNITY): Payer: Medicaid Other

## 2012-10-20 ENCOUNTER — Inpatient Hospital Stay (HOSPITAL_COMMUNITY)
Admission: EM | Admit: 2012-10-20 | Discharge: 2012-10-24 | DRG: 203 | Disposition: A | Discharge status: Home / Self Care | Payer: Medicaid Other | Attending: Internal Medicine | Admitting: Internal Medicine

## 2012-10-20 ENCOUNTER — Encounter (HOSPITAL_COMMUNITY): Payer: Self-pay | Admitting: *Deleted

## 2012-10-20 DIAGNOSIS — F329 Major depressive disorder, single episode, unspecified: Secondary | ICD-10-CM | POA: Diagnosis present

## 2012-10-20 DIAGNOSIS — R000 Tachycardia, unspecified: Secondary | ICD-10-CM | POA: Diagnosis present

## 2012-10-20 DIAGNOSIS — Z6838 Body mass index (BMI) 38.0-38.9, adult: Secondary | ICD-10-CM

## 2012-10-20 DIAGNOSIS — E669 Obesity, unspecified: Secondary | ICD-10-CM | POA: Diagnosis present

## 2012-10-20 DIAGNOSIS — J449 Chronic obstructive pulmonary disease, unspecified: Secondary | ICD-10-CM

## 2012-10-20 DIAGNOSIS — J45909 Unspecified asthma, uncomplicated: Secondary | ICD-10-CM | POA: Diagnosis present

## 2012-10-20 DIAGNOSIS — T380X5A Adverse effect of glucocorticoids and synthetic analogues, initial encounter: Secondary | ICD-10-CM | POA: Diagnosis present

## 2012-10-20 DIAGNOSIS — Z87891 Personal history of nicotine dependence: Secondary | ICD-10-CM

## 2012-10-20 DIAGNOSIS — K219 Gastro-esophageal reflux disease without esophagitis: Secondary | ICD-10-CM | POA: Diagnosis present

## 2012-10-20 DIAGNOSIS — Z7982 Long term (current) use of aspirin: Secondary | ICD-10-CM

## 2012-10-20 DIAGNOSIS — Z79899 Other long term (current) drug therapy: Secondary | ICD-10-CM

## 2012-10-20 DIAGNOSIS — D72829 Elevated white blood cell count, unspecified: Secondary | ICD-10-CM | POA: Diagnosis present

## 2012-10-20 DIAGNOSIS — R7989 Other specified abnormal findings of blood chemistry: Secondary | ICD-10-CM | POA: Diagnosis present

## 2012-10-20 DIAGNOSIS — J441 Chronic obstructive pulmonary disease with (acute) exacerbation: Secondary | ICD-10-CM

## 2012-10-20 DIAGNOSIS — E039 Hypothyroidism, unspecified: Secondary | ICD-10-CM | POA: Diagnosis present

## 2012-10-20 DIAGNOSIS — E785 Hyperlipidemia, unspecified: Secondary | ICD-10-CM | POA: Diagnosis present

## 2012-10-20 DIAGNOSIS — Z794 Long term (current) use of insulin: Secondary | ICD-10-CM

## 2012-10-20 DIAGNOSIS — F3289 Other specified depressive episodes: Secondary | ICD-10-CM | POA: Diagnosis present

## 2012-10-20 DIAGNOSIS — J45901 Unspecified asthma with (acute) exacerbation: Principal | ICD-10-CM | POA: Diagnosis present

## 2012-10-20 DIAGNOSIS — K7689 Other specified diseases of liver: Secondary | ICD-10-CM | POA: Diagnosis present

## 2012-10-20 DIAGNOSIS — I10 Essential (primary) hypertension: Secondary | ICD-10-CM | POA: Diagnosis present

## 2012-10-20 DIAGNOSIS — E119 Type 2 diabetes mellitus without complications: Secondary | ICD-10-CM | POA: Diagnosis present

## 2012-10-20 DIAGNOSIS — J302 Other seasonal allergic rhinitis: Secondary | ICD-10-CM

## 2012-10-20 DIAGNOSIS — J309 Allergic rhinitis, unspecified: Secondary | ICD-10-CM | POA: Diagnosis present

## 2012-10-20 HISTORY — DX: Unspecified asthma, uncomplicated: J45.909

## 2012-10-20 LAB — COMPREHENSIVE METABOLIC PANEL
Albumin: 3.7 g/dL (ref 3.5–5.2)
BUN: 11 mg/dL (ref 6–23)
Creatinine, Ser: 0.66 mg/dL (ref 0.50–1.10)
Potassium: 4.2 mEq/L (ref 3.5–5.1)
Total Protein: 6.7 g/dL (ref 6.0–8.3)

## 2012-10-20 LAB — CBC WITH DIFFERENTIAL/PLATELET
Basophils Absolute: 0.1 10*3/uL (ref 0.0–0.1)
Basophils Relative: 1 % (ref 0–1)
HCT: 40.8 % (ref 36.0–46.0)
Hemoglobin: 13.5 g/dL (ref 12.0–15.0)
Lymphocytes Relative: 24 % (ref 12–46)
MCHC: 33.1 g/dL (ref 30.0–36.0)
Monocytes Absolute: 0.6 10*3/uL (ref 0.1–1.0)
Monocytes Relative: 6 % (ref 3–12)
Neutro Abs: 6.8 10*3/uL (ref 1.7–7.7)
Neutrophils Relative %: 61 % (ref 43–77)
WBC: 11.2 10*3/uL — ABNORMAL HIGH (ref 4.0–10.5)

## 2012-10-20 LAB — GLUCOSE, CAPILLARY: Glucose-Capillary: 268 mg/dL — ABNORMAL HIGH (ref 70–99)

## 2012-10-20 LAB — PRO B NATRIURETIC PEPTIDE: Pro B Natriuretic peptide (BNP): 32.8 pg/mL (ref 0–125)

## 2012-10-20 LAB — TROPONIN I: Troponin I: <0.3 ng/mL (ref <0.30)

## 2012-10-20 MED ORDER — SORBITOL 70 % SOLN
30.0000 mL | Freq: Every day | Status: DC | PRN
  Filled 2012-10-20: qty 30

## 2012-10-20 MED ORDER — LORATADINE 10 MG PO TABS
10.0000 mg | ORAL_TABLET | Freq: Every day | ORAL | Status: DC
  Administered 2012-10-21 – 2012-10-24 (×4): 10 mg via ORAL
  Filled 2012-10-20 (×4): qty 1

## 2012-10-20 MED ORDER — ASPIRIN EC 81 MG PO TBEC
81.0000 mg | DELAYED_RELEASE_TABLET | Freq: Every day | ORAL | Status: DC
  Administered 2012-10-21 – 2012-10-24 (×4): 81 mg via ORAL
  Filled 2012-10-20 (×4): qty 1

## 2012-10-20 MED ORDER — INSULIN GLARGINE 100 UNIT/ML ~~LOC~~ SOLN: 5.0000 [IU] | Freq: Every day | SUBCUTANEOUS | Status: DC

## 2012-10-20 MED ORDER — METFORMIN HCL 500 MG PO TABS
1000.0000 mg | ORAL_TABLET | Freq: Two times a day (BID) | ORAL | Status: DC
  Administered 2012-10-21 – 2012-10-24 (×7): 1000 mg via ORAL
  Filled 2012-10-20 (×7): qty 2

## 2012-10-20 MED ORDER — LOSARTAN POTASSIUM-HCTZ 50-12.5 MG PO TABS: 1.0000 | ORAL_TABLET | Freq: Every day | ORAL | Status: DC

## 2012-10-20 MED ORDER — IPRATROPIUM BROMIDE 0.02 % IN SOLN
0.5000 mg | RESPIRATORY_TRACT | Status: DC
  Administered 2012-10-20 – 2012-10-24 (×17): 0.5 mg via RESPIRATORY_TRACT
  Filled 2012-10-20 (×18): qty 2.5

## 2012-10-20 MED ORDER — INSULIN ASPART 100 UNIT/ML ~~LOC~~ SOLN
0.0000 [IU] | Freq: Every day | SUBCUTANEOUS | Status: DC
  Administered 2012-10-20: 3 [IU] via SUBCUTANEOUS
  Administered 2012-10-21: 2 [IU] via SUBCUTANEOUS

## 2012-10-20 MED ORDER — INSULIN ASPART 100 UNIT/ML ~~LOC~~ SOLN
0.0000 [IU] | Freq: Three times a day (TID) | SUBCUTANEOUS | Status: DC
  Administered 2012-10-21 – 2012-10-22 (×4): 5 [IU] via SUBCUTANEOUS

## 2012-10-20 MED ORDER — IPRATROPIUM BROMIDE 0.02 % IN SOLN: 0.5000 mg | Freq: Once | RESPIRATORY_TRACT | Status: DC

## 2012-10-20 MED ORDER — IPRATROPIUM BROMIDE 0.02 % IN SOLN: 0.5000 mg | RESPIRATORY_TRACT | Status: DC | PRN

## 2012-10-20 MED ORDER — ALBUTEROL SULFATE (5 MG/ML) 0.5% IN NEBU: 2.5000 mg | INHALATION_SOLUTION | RESPIRATORY_TRACT | Status: DC | PRN

## 2012-10-20 MED ORDER — POTASSIUM CHLORIDE IN NACL 20-0.9 MEQ/L-% IV SOLN
INTRAVENOUS | Status: DC
  Administered 2012-10-20 – 2012-10-22 (×2): via INTRAVENOUS

## 2012-10-20 MED ORDER — SIMVASTATIN 10 MG PO TABS
10.0000 mg | ORAL_TABLET | Freq: Every day | ORAL | Status: DC
  Administered 2012-10-21 – 2012-10-23 (×3): 10 mg via ORAL
  Filled 2012-10-20 (×4): qty 1

## 2012-10-20 MED ORDER — ALBUTEROL SULFATE (5 MG/ML) 0.5% IN NEBU
INHALATION_SOLUTION | RESPIRATORY_TRACT | Status: AC
  Filled 2012-10-20: qty 0.5

## 2012-10-20 MED ORDER — ACETAMINOPHEN 500 MG PO TABS
500.0000 mg | ORAL_TABLET | Freq: Four times a day (QID) | ORAL | Status: DC | PRN
  Administered 2012-10-21: 500 mg via ORAL
  Filled 2012-10-20: qty 1

## 2012-10-20 MED ORDER — ALBUTEROL SULFATE (5 MG/ML) 0.5% IN NEBU
INHALATION_SOLUTION | RESPIRATORY_TRACT | Status: AC
  Administered 2012-10-20: 5 mg
  Filled 2012-10-20: qty 1

## 2012-10-20 MED ORDER — LEVOFLOXACIN IN D5W 750 MG/150ML IV SOLN
750.0000 mg | Freq: Every day | INTRAVENOUS | Status: DC
  Administered 2012-10-20 – 2012-10-21 (×2): 750 mg via INTRAVENOUS
  Filled 2012-10-20 (×2): qty 150

## 2012-10-20 MED ORDER — ALBUTEROL SULFATE (5 MG/ML) 0.5% IN NEBU
2.5000 mg | INHALATION_SOLUTION | RESPIRATORY_TRACT | Status: DC
  Administered 2012-10-20 – 2012-10-21 (×4): 2.5 mg via RESPIRATORY_TRACT
  Filled 2012-10-20 (×3): qty 0.5

## 2012-10-20 MED ORDER — FLUTICASONE PROPIONATE 50 MCG/ACT NA SUSP
1.0000 | Freq: Every day | NASAL | Status: DC
  Administered 2012-10-21 – 2012-10-24 (×4): 1 via NASAL
  Filled 2012-10-20: qty 16

## 2012-10-20 MED ORDER — METHYLPREDNISOLONE SODIUM SUCC 125 MG IJ SOLR
125.0000 mg | Freq: Four times a day (QID) | INTRAMUSCULAR | Status: DC
  Administered 2012-10-21 – 2012-10-23 (×10): 125 mg via INTRAVENOUS
  Filled 2012-10-20 (×12): qty 2

## 2012-10-20 MED ORDER — INSULIN GLARGINE 100 UNIT/ML ~~LOC~~ SOLN
30.0000 [IU] | Freq: Every day | SUBCUTANEOUS | Status: DC
  Administered 2012-10-20: 30 [IU] via SUBCUTANEOUS

## 2012-10-20 MED ORDER — ALBUTEROL SULFATE (5 MG/ML) 0.5% IN NEBU: 5.0000 mg | INHALATION_SOLUTION | Freq: Once | RESPIRATORY_TRACT | Status: DC

## 2012-10-20 MED ORDER — SODIUM CHLORIDE 0.9 % IJ SOLN
3.0000 mL | Freq: Two times a day (BID) | INTRAMUSCULAR | Status: DC
  Administered 2012-10-22: 3 mL via INTRAVENOUS

## 2012-10-20 MED ORDER — HYDROCHLOROTHIAZIDE 12.5 MG PO CAPS
12.5000 mg | ORAL_CAPSULE | Freq: Every day | ORAL | Status: DC
  Administered 2012-10-21 – 2012-10-24 (×4): 12.5 mg via ORAL
  Filled 2012-10-20 (×4): qty 1

## 2012-10-20 MED ORDER — ALBUTEROL SULFATE (5 MG/ML) 0.5% IN NEBU
5.0000 mg | INHALATION_SOLUTION | Freq: Once | RESPIRATORY_TRACT | Status: AC
  Administered 2012-10-20: 5 mg via RESPIRATORY_TRACT
  Filled 2012-10-20: qty 1

## 2012-10-20 MED ORDER — ONDANSETRON HCL 4 MG/2ML IJ SOLN: 4.0000 mg | INTRAMUSCULAR | Status: DC | PRN

## 2012-10-20 MED ORDER — LEVOFLOXACIN IN D5W 750 MG/150ML IV SOLN
INTRAVENOUS | Status: AC
  Filled 2012-10-20: qty 150

## 2012-10-20 MED ORDER — FLUTICASONE PROPIONATE HFA 44 MCG/ACT IN AERO
2.0000 | INHALATION_SPRAY | Freq: Two times a day (BID) | RESPIRATORY_TRACT | Status: DC
  Administered 2012-10-20 – 2012-10-22 (×5): 2 via RESPIRATORY_TRACT
  Filled 2012-10-20: qty 10.6

## 2012-10-20 MED ORDER — LOSARTAN POTASSIUM 50 MG PO TABS
50.0000 mg | ORAL_TABLET | Freq: Every day | ORAL | Status: DC
  Administered 2012-10-21 – 2012-10-24 (×4): 50 mg via ORAL
  Filled 2012-10-20 (×4): qty 1

## 2012-10-20 MED ORDER — GUAIFENESIN ER 600 MG PO TB12
1200.0000 mg | ORAL_TABLET | Freq: Two times a day (BID) | ORAL | Status: DC
  Administered 2012-10-20 – 2012-10-24 (×8): 1200 mg via ORAL
  Filled 2012-10-20 (×8): qty 2

## 2012-10-20 MED ORDER — ENOXAPARIN SODIUM 40 MG/0.4ML ~~LOC~~ SOLN
40.0000 mg | SUBCUTANEOUS | Status: DC
  Administered 2012-10-21 – 2012-10-24 (×4): 40 mg via SUBCUTANEOUS
  Filled 2012-10-20 (×4): qty 0.4

## 2012-10-20 MED ORDER — IPRATROPIUM BROMIDE 0.02 % IN SOLN
RESPIRATORY_TRACT | Status: AC
  Administered 2012-10-20: 0.5 mg
  Filled 2012-10-20: qty 2.5

## 2012-10-20 MED ORDER — IPRATROPIUM BROMIDE 0.02 % IN SOLN
RESPIRATORY_TRACT | Status: AC
  Filled 2012-10-20: qty 2.5

## 2012-10-20 MED ORDER — PANTOPRAZOLE SODIUM 40 MG PO TBEC
40.0000 mg | DELAYED_RELEASE_TABLET | Freq: Every day | ORAL | Status: DC
  Administered 2012-10-21: 40 mg via ORAL
  Filled 2012-10-20: qty 1

## 2012-10-20 MED ORDER — TRAZODONE HCL 50 MG PO TABS
50.0000 mg | ORAL_TABLET | Freq: Every evening | ORAL | Status: DC | PRN
  Administered 2012-10-22 (×2): 50 mg via ORAL
  Filled 2012-10-20 (×2): qty 1

## 2012-10-20 MED ORDER — METHYLPREDNISOLONE SODIUM SUCC 125 MG IJ SOLR
125.0000 mg | Freq: Once | INTRAMUSCULAR | Status: AC
  Administered 2012-10-20: 125 mg via INTRAVENOUS
  Filled 2012-10-20: qty 2

## 2012-10-20 MED ORDER — CITALOPRAM HYDROBROMIDE 20 MG PO TABS
40.0000 mg | ORAL_TABLET | Freq: Every day | ORAL | Status: DC
Start: 2012-10-21 — End: 2012-10-24
  Administered 2012-10-21 – 2012-10-24 (×4): 40 mg via ORAL
  Filled 2012-10-20 (×5): qty 2

## 2012-10-20 MED ORDER — LEVOTHYROXINE SODIUM 75 MCG PO TABS
175.0000 ug | ORAL_TABLET | Freq: Every day | ORAL | Status: DC
  Administered 2012-10-21 – 2012-10-24 (×4): 175 ug via ORAL
  Filled 2012-10-20 (×4): qty 1

## 2012-10-20 NOTE — ED Notes (Signed)
Dr Campbell in room assessing pt at this time.  

## 2012-10-20 NOTE — H&P (Signed)
[facility] History and Physical  Sarah Pope  WUJ:[id number]  DOB: 03/16/1956   DOA: 10/20/2012   PCP:   [name], MD  Pulmonologist: Dr. Shan Levans  Chief Complaint:  Shortness of breath for 2 days  HPI: Sarah Pope is an 56 y.o. female.   This is the third admission in 3 months for this Obese middle-aged Caucasian lady diagnosed with asthma  3 months ago. Despite use of her home she is been having worsening shortness of breath for the past 2 days associated with cough productive of yellow sputum. She has noted no fever nor chest pain.   She denies sick contacts; she has had taken her flu shot this year  Rewiew of Systems:   All systems negative except as marked bold or noted in the HPI;  Constitutional:  malaise, fever and chills. ;  Eyes: eye pain, redness and discharge. ;  ENMT: ear pain, hoarseness, nasal congestion, sinus pressure and sore throat. ;  Cardiovascular:  chest pain, palpitations, diaphoresis, dyspnea and peripheral edema. ;  Respiratory:  cough, hemoptysis, wheezing and stridor. ;  Gastrointestinal:  nausea, vomiting, diarrhea, constipation, abdominal pain, melena, blood in stool, hematemesis, jaundice and rectal bleeding. unusual weight loss..   Genitourinary: r frequency, dysuria, incontinence,flank pain and hematuria; Musculoskeletal:  back pain and neck pain.  swelling and trauma.;  Skin: .  pruritus, rash, abrasions, bruising and skin lesion.; ulcerations Neuro:  headache, lightheadedness and neck stiffness.  weakness, altered level of consciousness , altered mental status, extremity weakness, burning feet, involuntary movement, seizure and syncope.  Psych:  anxiety, depression, insomnia, tearfulness, panic attacks, hallucinations, paranoia, suicidal or homicidal ideation    Past Medical History  Diagnosis Date  . Seasonal allergies   . Depression   . DM type 2 (diabetes mellitus, type 2)   . GERD (gastroesophageal reflux disease)   .  Hyperlipidemia   . Hypertension   . Hypothyroidism   . FATTY LIVER DISEASE 08/05/2008  . PERSONAL HX COLONIC POLYPS 09/01/2008  . TRANSAMINASES, SERUM, ELEVATED 08/05/2008  . Obesity, Class III, BMI 40-49.9 (morbid obesity)   . Bronchitis with bronchospasm   . Leukocytosis   . Acute respiratory failure with hypercapnia   . Asthma     Past Surgical History  Procedure Date  . Mandible surgery 1988    realignment  . Nose surgery 2007    sinus infections    Medications:  HOME MEDS: Prior to Admission medications   Medication Sig Start Date End Date Taking? Authorizing Provider  acetaminophen (TYLENOL) 325 MG tablet Take 2 tablets (650 mg total) by mouth every 6 (six) hours as needed for pain or fever. 07/03/12 07/03/13 Yes Christiane Ha, MD  albuterol (PROVENTIL HFA;VENTOLIN HFA) 108 (90 BASE) MCG/ACT inhaler Inhale 2 puffs into the lungs every 6 (six) hours as needed. Shortness of Breath/ Tightness in Chest 09/03/12  Yes Elliot Cousin, MD  albuterol (PROVENTIL) (2.5 MG/3ML) 0.083% nebulizer solution Take 3 mLs (2.5 mg total) by nebulization every 4 (four) hours as needed for wheezing or shortness of breath. Shortness of Breath/ Tightness in Chest 09/03/12  Yes Elliot Cousin, MD  aspirin EC 81 MG tablet Take 81 mg by mouth daily.   Yes Historical Provider, MD  budesonide (PULMICORT FLEXHALER) 180 MCG/ACT inhaler Inhale 2 puffs into the lungs 2 (two) times daily. 10/09/12  Yes Storm Frisk, MD  cetirizine (ZYRTEC) 10 MG tablet Take 10 mg by mouth daily.   Yes Historical Provider, MD  citalopram (CELEXA) 40 MG tablet Take 40 mg by mouth daily.   Yes Historical Provider, MD  insulin glargine (LANTUS) 100 UNIT/ML injection Inject 30 Units into the skin at bedtime.   Yes Historical Provider, MD  insulin lispro (HUMALOG) 100 UNIT/ML injection Inject into the skin 3 (three) times daily as needed. Patient uses according to a sliding scale at home. Only uses it when sugar is high.   Yes  Historical Provider, MD  levothyroxine (SYNTHROID, LEVOTHROID) 175 MCG tablet Take 175 mcg by mouth daily.   Yes Historical Provider, MD  losartan-hydrochlorothiazide (HYZAAR) 50-12.5 MG per tablet Take 1 tablet by mouth daily. 10/09/12  Yes Storm Frisk, MD  metFORMIN (GLUCOPHAGE) 1000 MG tablet Take 1,000 mg by mouth 2 (two) times daily with a meal.   Yes Historical Provider, MD  mometasone (NASONEX) 50 MCG/ACT nasal spray Place 2 sprays into the nose daily. Congestion 10/09/12  Yes Storm Frisk, MD  oxazepam (SERAX) 10 MG capsule Take 10 mg by mouth at bedtime as needed. Anxiety   Yes Historical Provider, MD  pantoprazole (PROTONIX) 40 MG tablet Take 40 mg by mouth daily.   Yes Historical Provider, MD  pravastatin (PRAVACHOL) 20 MG tablet Take 20 mg by mouth daily.   Yes Historical Provider, MD     Allergies:  Allergies  Allergen Reactions  . Codeine Nausea Only  . Sulfa Antibiotics     itching    Social History:   reports that she quit smoking about 30 years ago. Her smoking use included Cigarettes. She has a 2.5 pack-year smoking history. She has never used smokeless tobacco. She reports that she does not drink alcohol or use illicit drugs.  Family History: Family History  Problem Relation Age of Onset  . Colon cancer Neg Hx   . Esophageal cancer Neg Hx   . Rectal cancer Neg Hx   . Stomach cancer Neg Hx   . Allergies Daughter   . Breast cancer Mother   . Prostate cancer Father      Physical Exam: Filed Vitals:   10/20/12 1939 10/20/12 2006 10/20/12 2037 10/20/12 2217  BP: 164/94   145/68  Pulse: 123     Temp: 98.1 F (36.7 C)     TempSrc: Oral     Resp: 28   22  Height: 5\' 5"  (1.651 m)     Weight: 103.874 kg (229 lb)     SpO2: 96% 89% 98% 98%   Blood pressure 145/68, pulse 123, temperature 98.1 F (36.7 C), temperature source Oral, resp. rate 22, height 5\' 5"  (1.651 m), weight 103.874 kg (229 lb), SpO2 98.00%.  GEN:  Pleasant obese Caucasian lady  sitting up in the stretcher in moderate respiratory distress; cooperative with exam PSYCH:  alert and oriented x4; does not appear a little anxious; affect is appropriate. HEENT: Mucous membranes pink and anicteric; PERRLA; EOM intact; no cervical lymphadenopathy nor thyromegaly or carotid bruit; no JVD; Breasts:: Not examined CHEST WALL: No tenderness CHEST: Tachypneic; diffuse expiratory wheezing bilaterally HEART: Tachycardic regular rhythm; no murmurs rubs or gallops BACK: Mild kyphosis no scoliosis; no CVA tenderness ABDOMEN: Obese, soft non-tender; no masses, no organomegaly, normal abdominal bowel sounds; no pannus; no intertriginous candida. Rectal Exam: Not done EXTREMITIES: No bone or joint deformity; ; no edema; no ulcerations. Genitalia: not examined PULSES: 2+ and symmetric SKIN: Normal hydration no rash or ulceration CNS: Cranial nerves 2-12 grossly intact no focal lateralizing neurologic deficit   Labs on Admission:  Basic  Metabolic Panel:  Lab 10/20/12 1610  NA 139  K 4.2  CL 101  CO2 28  GLUCOSE 185*  BUN 11  CREATININE 0.66  CALCIUM 8.9  MG --  PHOS --   Liver Function Tests:  Lab 10/20/12 2024  AST 37  ALT 48*  ALKPHOS 90  BILITOT 0.4  PROT 6.7  ALBUMIN 3.7   No results found for this basename: LIPASE:5,AMYLASE:5 in the last 168 hours No results found for this basename: AMMONIA:5 in the last 168 hours CBC:  Lab 10/20/12 2024  WBC 11.2*  NEUTROABS 6.8  HGB 13.5  HCT 40.8  MCV 83.1  PLT 288   Cardiac Enzymes:  Lab 10/20/12 2024  CKTOTAL --  CKMB --  CKMBINDEX --  TROPONINI <0.30   BNP: No components found with this basename: POCBNP:5 D-dimer: No components found with this basename: D-DIMER:5 CBG: No results found for this basename: GLUCAP:5 in the last 168 hours  Radiological Exams on Admission: Dg Chest Port 1 View  10/20/2012  *RADIOLOGY REPORT*  Clinical Data: Shortness of breath and chest pressure; history of asthma and  diabetes.  PORTABLE CHEST - 1 VIEW  Comparison: Chest radiograph performed 09/01/2012  Findings: The lungs are well-aerated.  Mild bibasilar opacities likely reflect atelectasis.  There is no evidence of pleural effusion or pneumothorax.  The cardiomediastinal silhouette is within normal limits.  No acute osseous abnormalities are seen.  IMPRESSION: Mild bibasilar airspace opacities likely reflect atelectasis; lungs otherwise clear.   Original Report Authenticated By: Tonia Ghent, M.D.     EKG: Independently reviewed. Sinus tachycardia   Assessment/Plan Present on Admission:  . Moderate persistent asthma with reflux disease . Asthma exacerbation/status asthmaticus  . OBESITY . DM type 2 (diabetes mellitus, type 2) . HTN (hypertension) . Hypothyroidism . Elevated LFTs   PLAN: We'll admit this lady for serial nebulizations with sympatheticomemetics and anticholinergics; quinolone antibiotic coverage; mucolytics; serial steroids  Continue baseline insulin and add sliding scale coverage  Continue home medications  Other plans as per orders.  Code Status: FULL CODE   Disposition Plan: Depending on her response to therapy, likely home in 2 or 3 days   Samreen Seltzer Nocturnist [facility] Pager [phone]   10/20/2012, 10:31 PM

## 2012-10-20 NOTE — ED Notes (Addendum)
resp therapy in room with 2nd neb

## 2012-10-20 NOTE — ED Notes (Signed)
Pt taken to bathroom by Lamar Laundry, NT in wheelchair.

## 2012-10-20 NOTE — ED Notes (Signed)
Called to give report, RN will call back when available

## 2012-10-20 NOTE — ED Provider Notes (Addendum)
History     CSN: [id number]  Arrival date & time 10/20/12  0865   First MD Initiated Contact with Patient 10/20/12 2008      Chief Complaint  Patient presents with  . Asthma    (Consider location/radiation/quality/duration/timing/severity/associated sxs/prior treatment) Patient is a 56 y.o. female presenting with shortness of breath. The history is provided by the patient (the pt complains of sob). No language interpreter was used.  Shortness of Breath  The current episode started 2 days ago. The problem occurs continuously. The problem has been unchanged. The problem is severe. Nothing relieves the symptoms. Nothing aggravates the symptoms. Associated symptoms include shortness of breath. Pertinent negatives include no chest pain and no cough. She has not inhaled smoke recently. She has had prior hospitalizations.    Past Medical History  Diagnosis Date  . Seasonal allergies   . Depression   . DM type 2 (diabetes mellitus, type 2)   . GERD (gastroesophageal reflux disease)   . Hyperlipidemia   . Hypertension   . Hypothyroidism   . FATTY LIVER DISEASE 08/05/2008  . PERSONAL HX COLONIC POLYPS 09/01/2008  . TRANSAMINASES, SERUM, ELEVATED 08/05/2008  . Obesity, Class III, BMI 40-49.9 (morbid obesity)   . Bronchitis with bronchospasm   . Leukocytosis   . Acute respiratory failure with hypercapnia   . Asthma     Past Surgical History  Procedure Date  . Mandible surgery 1988    realignment  . Nose surgery 2007    sinus infections    Family History  Problem Relation Age of Onset  . Colon cancer Neg Hx   . Esophageal cancer Neg Hx   . Rectal cancer Neg Hx   . Stomach cancer Neg Hx   . Allergies Daughter   . Breast cancer Mother   . Prostate cancer Father     History  Substance Use Topics  . Smoking status: Former Smoker -- 0.5 packs/day for 5 years    Types: Cigarettes    Quit date: 10/23/1982  . Smokeless tobacco: Never Used  . Alcohol Use: No    OB  History    Grav Para Term Preterm Abortions TAB SAB Ect Mult Living                  Review of Systems  Constitutional: Negative for fatigue.  HENT: Negative for congestion, sinus pressure and ear discharge.   Eyes: Negative for discharge.  Respiratory: Positive for shortness of breath. Negative for cough.   Cardiovascular: Negative for chest pain.  Gastrointestinal: Negative for abdominal pain and diarrhea.  Genitourinary: Negative for frequency and hematuria.  Musculoskeletal: Negative for back pain.  Skin: Negative for rash.  Neurological: Negative for seizures and headaches.  Hematological: Negative.   Psychiatric/Behavioral: Negative for hallucinations.    Allergies  Codeine and Sulfa antibiotics  Home Medications   Current Outpatient Rx  Name  Route  Sig  Dispense  Refill  . ACETAMINOPHEN 325 MG PO TABS   Oral   Take 2 tablets (650 mg total) by mouth every 6 (six) hours as needed for pain or fever.         . ALBUTEROL SULFATE HFA 108 (90 BASE) MCG/ACT IN AERS   Inhalation   Inhale 2 puffs into the lungs every 6 (six) hours as needed. Shortness of Breath/ Tightness in Chest   1 Inhaler   6   . ALBUTEROL SULFATE (2.5 MG/3ML) 0.083% IN NEBU   Nebulization   Take 3 mLs (2.5  mg total) by nebulization every 4 (four) hours as needed for wheezing or shortness of breath. Shortness of Breath/ Tightness in Chest   75 mL   3   . ASPIRIN EC 81 MG PO TBEC   Oral   Take 81 mg by mouth daily.         . BUDESONIDE 180 MCG/ACT IN AEPB   Inhalation   Inhale 2 puffs into the lungs 2 (two) times daily.   1 Inhaler   6   . CETIRIZINE HCL 10 MG PO TABS   Oral   Take 10 mg by mouth daily.         Marland Kitchen CITALOPRAM HYDROBROMIDE 40 MG PO TABS   Oral   Take 40 mg by mouth daily.         . INSULIN GLARGINE 100 UNIT/ML [place] SOLN   Subcutaneous   Inject 30 Units into the skin at bedtime.         . INSULIN LISPRO (HUMAN) 100 UNIT/ML [employer] SOLN   Subcutaneous   Inject into  the skin 3 (three) times daily as needed. Patient uses according to a sliding scale at home. Only uses it when sugar is high.         Marland Kitchen LEVOTHYROXINE SODIUM 175 MCG PO TABS   Oral   Take 175 mcg by mouth daily.         Marland Kitchen LOSARTAN POTASSIUM-HCTZ 50-12.5 MG PO TABS   Oral   Take 1 tablet by mouth daily.   30 tablet   6   . METFORMIN HCL 1000 MG PO TABS   Oral   Take 1,000 mg by mouth 2 (two) times daily with a meal.         . MOMETASONE FUROATE 50 MCG/ACT NA SUSP   Nasal   Place 2 sprays into the nose daily. Congestion   17 g   6   . OXAZEPAM 10 MG PO CAPS   Oral   Take 10 mg by mouth at bedtime as needed. Anxiety         . PANTOPRAZOLE SODIUM 40 MG PO TBEC   Oral   Take 40 mg by mouth daily.         Marland Kitchen PRAVASTATIN SODIUM 20 MG PO TABS   Oral   Take 20 mg by mouth daily.           BP 164/94  Pulse 123  Temp 98.1 F (36.7 C) (Oral)  Resp 28  Ht 5\' 5"  (1.651 m)  Wt 229 lb (103.874 kg)  BMI 38.11 kg/m2  SpO2 98%  Physical Exam  Constitutional: She is oriented to person, place, and time. She appears well-developed.  HENT:  Head: Normocephalic and atraumatic.  Eyes: Conjunctivae normal and EOM are normal. No scleral icterus.  Neck: Neck supple. No thyromegaly present.  Cardiovascular: Normal rate and regular rhythm.  Exam reveals no gallop and no friction rub.   No murmur heard. Pulmonary/Chest: No stridor. She has wheezes. She has no rales. She exhibits no tenderness.  Abdominal: She exhibits no distension. There is no tenderness. There is no rebound.  Musculoskeletal: Normal range of motion. She exhibits no edema.  Lymphadenopathy:    She has no cervical adenopathy.  Neurological: She is oriented to person, place, and time. Coordination normal.  Skin: No rash noted. No erythema.  Psychiatric: She has a normal mood and affect. Her behavior is normal.    ED Course  Procedures (including critical care time)  Labs Reviewed  CBC WITH DIFFERENTIAL -  Abnormal; Notable for the following:    WBC 11.2 (*)     Eosinophils Relative 9 (*)     Eosinophils Absolute 1.0 (*)     All other components within normal limits  COMPREHENSIVE METABOLIC PANEL - Abnormal; Notable for the following:    Glucose, Bld 185 (*)     ALT 48 (*)     All other components within normal limits  TROPONIN I  PRO B NATRIURETIC PEPTIDE   Dg Chest Port 1 View  10/20/2012  *RADIOLOGY REPORT*  Clinical Data: Shortness of breath and chest pressure; history of asthma and diabetes.  PORTABLE CHEST - 1 VIEW  Comparison: Chest radiograph performed 09/01/2012  Findings: The lungs are well-aerated.  Mild bibasilar opacities likely reflect atelectasis.  There is no evidence of pleural effusion or pneumothorax.  The cardiomediastinal silhouette is within normal limits.  No acute osseous abnormalities are seen.  IMPRESSION: Mild bibasilar airspace opacities likely reflect atelectasis; lungs otherwise clear.   Original Report Authenticated By: Tonia Ghent, M.D.      1. COPD (chronic obstructive pulmonary disease)       Date: 10/20/2012  Rate: 108  Rhythm: sinus tach  QRS Axis: normal  Intervals: normal  ST/T Wave abnormalities: nonspecific ST changes  Conduction Disutrbances:none  Narrative Interpretation:   Old EKG Reviewed: none available   MDM          Benny Lennert, MD 10/20/12 2130  Benny Lennert, MD 11/05/12 [phone]

## 2012-10-20 NOTE — ED Notes (Signed)
Pt c/lo sob and states she has has sx for over a day. Pt states she has gotten worse today.

## 2012-10-21 DIAGNOSIS — I10 Essential (primary) hypertension: Secondary | ICD-10-CM

## 2012-10-21 DIAGNOSIS — R7989 Other specified abnormal findings of blood chemistry: Secondary | ICD-10-CM

## 2012-10-21 LAB — COMPREHENSIVE METABOLIC PANEL
ALT: 47 U/L — ABNORMAL HIGH (ref 0–35)
AST: 29 U/L (ref 0–37)
Calcium: 9 mg/dL (ref 8.4–10.5)
Creatinine, Ser: 0.59 mg/dL (ref 0.50–1.10)
GFR calc Af Amer: >90 mL/min (ref >90)
GFR calc non Af Amer: >90 mL/min (ref >90)
Sodium: 140 mEq/L (ref 135–145)
Total Protein: 7.1 g/dL (ref 6.0–8.3)

## 2012-10-21 LAB — GLUCOSE, CAPILLARY
Glucose-Capillary: 244 mg/dL — ABNORMAL HIGH (ref 70–99)
Glucose-Capillary: 210 mg/dL — ABNORMAL HIGH (ref 70–99)
Glucose-Capillary: 212 mg/dL — ABNORMAL HIGH (ref 70–99)

## 2012-10-21 LAB — HEMOGLOBIN A1C: Mean Plasma Glucose: 163 mg/dL — ABNORMAL HIGH (ref <117)

## 2012-10-21 LAB — URINALYSIS, ROUTINE W REFLEX MICROSCOPIC
Bilirubin Urine: NEGATIVE
Hgb urine dipstick: NEGATIVE
Nitrite: NEGATIVE
Protein, ur: NEGATIVE mg/dL
Urobilinogen, UA: 0.2 mg/dL (ref 0.0–1.0)

## 2012-10-21 LAB — CBC
MCH: 27.1 pg (ref 26.0–34.0)
MCHC: 32.6 g/dL (ref 30.0–36.0)
MCV: 83.1 fL (ref 78.0–100.0)
Platelets: 308 10*3/uL (ref 150–400)

## 2012-10-21 LAB — URINE MICROSCOPIC-ADD ON

## 2012-10-21 MED ORDER — BENZONATATE 100 MG PO CAPS
200.0000 mg | ORAL_CAPSULE | Freq: Three times a day (TID) | ORAL | Status: DC | PRN
  Administered 2012-10-21 – 2012-10-22 (×3): 200 mg via ORAL
  Filled 2012-10-21 (×3): qty 2

## 2012-10-21 MED ORDER — IBUPROFEN 800 MG PO TABS
400.0000 mg | ORAL_TABLET | Freq: Four times a day (QID) | ORAL | Status: DC | PRN
Start: 2012-10-21 — End: 2012-10-24
  Administered 2012-10-21: 400 mg via ORAL
  Filled 2012-10-21: qty 1

## 2012-10-21 MED ORDER — LEVALBUTEROL HCL 0.63 MG/3ML IN NEBU: 0.6300 mg | INHALATION_SOLUTION | RESPIRATORY_TRACT | Status: DC | PRN

## 2012-10-21 MED ORDER — LEVALBUTEROL HCL 0.63 MG/3ML IN NEBU
0.6300 mg | INHALATION_SOLUTION | RESPIRATORY_TRACT | Status: DC
  Administered 2012-10-21 – 2012-10-24 (×13): 0.63 mg via RESPIRATORY_TRACT
  Filled 2012-10-21 (×14): qty 3

## 2012-10-21 MED ORDER — PANTOPRAZOLE SODIUM 40 MG PO TBEC
40.0000 mg | DELAYED_RELEASE_TABLET | Freq: Two times a day (BID) | ORAL | Status: DC
  Administered 2012-10-21 – 2012-10-24 (×6): 40 mg via ORAL
  Filled 2012-10-21 (×6): qty 1

## 2012-10-21 MED ORDER — INSULIN GLARGINE 100 UNIT/ML ~~LOC~~ SOLN
35.0000 [IU] | Freq: Every day | SUBCUTANEOUS | Status: DC
  Administered 2012-10-21: 35 [IU] via SUBCUTANEOUS

## 2012-10-21 NOTE — Care Management Note (Unsigned)
    Page 1 of 1   10/21/2012     2:11:27 PM   CARE MANAGEMENT NOTE 10/21/2012  Patient:  Sarah Pope, Sarah Pope   Account Number:  [id number]  Date Initiated:  10/21/2012  Documentation initiated by:  Rosemary Holms  Subjective/Objective Assessment:   Pt admitted from home where she lives with her daughter and significant other. Anticipates DC badk to home. States she has not had HH before. No insurance. At this time, No HH or meds needs noted     Action/Plan:   Anticipated DC Date:  10/23/2012   Anticipated DC Plan:  HOME/SELF CARE      DC Planning Services  CM consult      Choice offered to / List presented to:             Status of service:  In process, will continue to follow Medicare Important Message given?   (If response is "NO", the following Medicare IM given date fields will be blank) Date Medicare IM given:   Date Additional Medicare IM given:    Discharge Disposition:    Per UR Regulation:    If discussed at Long Length of Stay Meetings, dates discussed:    Comments:  10/21/12 Rosemary Holms RN BSN CM Spoke to pt who stated that she does not believe she will need Medication assistance since she does not anticipate going home with new meds. CM will continue to follow.l

## 2012-10-21 NOTE — Progress Notes (Signed)
UR Chart Review Completed  

## 2012-10-21 NOTE — Progress Notes (Signed)
Inpatient Diabetes Program Recommendations  AACE/ADA: New Consensus Statement on Inpatient Glycemic Control (2013)  Target Ranges:  Prepandial:   less than 140 mg/dL      Peak postprandial:   less than 180 mg/dL (1-2 hours)      Critically ill patients:  140 - 180 mg/dL   Results for AREESHA, DEHAVEN (MRN [id number]) as of 10/21/2012 08:51  Ref. Range 09/02/2012 21:28 09/03/2012 07:44 09/03/2012 11:55 10/20/2012 23:13 10/21/2012 07:25  Glucose-Capillary Latest Range: 70-99 mg/dL 413 (H) 244 (H) 010 (H) 268 (H) 244 (H)    Inpatient Diabetes Program Recommendations Insulin - Basal: Please increase Lantus to 35 units QHS. Correction (SSI): Please increase Novolog correction to Resistant scale.  Note: According to the patient's chart, she takes Lantus 30 units QHS, Metformin 1000mg  BID, and Humalog sliding scale TID at home for diabetes management.  Currently patient is ordered Lantus 30 units QHS and Novolog moderate scale ACHS.  Fasting blood glucose this am was 244 mg/dl and patient is receiving Solumedrol 125mg  Q6H.  Please consider increasing Lantus to 35 units QHS and increase Novolog correction scale to Resistant.  Will continue to follow.  Thanks, Orlando Penner, RN, BSN, CCRN Diabetes Coordinator Inpatient Diabetes Program [phone]

## 2012-10-21 NOTE — Progress Notes (Signed)
[facility]             Progress Note   Subjective: Patient is still short of breath, but feels better since admission.  She is coughing. She becomes very dyspneic with minor exertion  Objective: Vital signs in last 24 hours: Temp:  [97.7 F (36.5 C)-98.4 F (36.9 C)] 98.4 F (36.9 C) (12/30 1106) Pulse Rate:  [94-123] 110  (12/30 1200) Resp:  [18-28] 24  (12/30 1200) BP: (136-164)/(66-94) 136/75 mmHg (12/30 1106) SpO2:  [89 %-98 %] 97 % (12/30 1200) Weight:  [103.874 kg (229 lb)-104.9 kg (231 lb 4.2 oz)] 104.9 kg (231 lb 4.2 oz) (12/29 2259) Weight change:  Last BM Date: 10/20/12  Intake/Output from previous day: 12/29 0701 - 12/30 0700 In: 480 [I.V.:330; IV Piggyback:150] Out: 350 [Urine:350] Total I/O In: 480 [P.O.:480] Out: 350 [Urine:350]   Physical Exam: General: Alert, awake, oriented x3, in no acute distress. HEENT: No bruits, no goiter. Heart: s1, s2, tachycardic Lungs: Clear to auscultation bilaterally. Abdomen: Soft, nontender, nondistended, positive bowel sounds. Extremities: No clubbing cyanosis or edema with positive pedal pulses. Neuro: Grossly intact, nonfocal.    Lab Results: Basic Metabolic Panel:  Basename 10/21/12 0522 10/20/12 2024  NA 140 139  K 3.8 4.2  CL 102 101  CO2 24 28  GLUCOSE 252* 185*  BUN 10 11  CREATININE 0.59 0.66  CALCIUM 9.0 8.9  MG -- --  PHOS -- --   Liver Function Tests:  Basename 10/21/12 0522 10/20/12 2024  AST 29 37  ALT 47* 48*  ALKPHOS 88 90  BILITOT 0.5 0.4  PROT 7.1 6.7  ALBUMIN 3.9 3.7   No results found for this basename: LIPASE:2,AMYLASE:2 in the last 72 hours No results found for this basename: AMMONIA:2 in the last 72 hours CBC:  Basename 10/21/12 0522 10/20/12 2024  WBC 9.5 11.2*  NEUTROABS -- 6.8  HGB 13.3 13.5  HCT 40.8 40.8  MCV 83.1 83.1  PLT 308 288   Cardiac Enzymes:  Basename 10/20/12 2024  CKTOTAL --  CKMB --  CKMBINDEX --  TROPONINI <0.30    BNP:  Basename 10/20/12 2024  PROBNP 32.8   D-Dimer: No results found for this basename: DDIMER:2 in the last 72 hours CBG:  Basename 10/21/12 1114 10/21/12 0725 10/20/12 2313  GLUCAP 210* 244* 268*   Hemoglobin A1C: No results found for this basename: HGBA1C in the last 72 hours Fasting Lipid Panel: No results found for this basename: CHOL,HDL,LDLCALC,TRIG,CHOLHDL,LDLDIRECT in the last 72 hours Thyroid Function Tests: No results found for this basename: TSH,T4TOTAL,FREET4,T3FREE,THYROIDAB in the last 72 hours Anemia Panel: No results found for this basename: VITAMINB12,FOLATE,FERRITIN,TIBC,IRON,RETICCTPCT in the last 72 hours Coagulation: No results found for this basename: LABPROT:2,INR:2 in the last 72 hours Urine Drug Screen: Drugs of Abuse  No results found for this basename: labopia, cocainscrnur, labbenz, amphetmu, thcu, labbarb    Alcohol Level: No results found for this basename: ETH:2 in the last 72 hours Urinalysis:  Basename 10/21/12 0148  COLORURINE YELLOW  LABSPEC >1.030*  PHURINE 6.0  GLUCOSEU >1000*  HGBUR NEGATIVE  BILIRUBINUR NEGATIVE  KETONESUR TRACE*  PROTEINUR NEGATIVE  UROBILINOGEN 0.2  NITRITE NEGATIVE  LEUKOCYTESUR NEGATIVE    No results found for this or any previous visit (from the past 240 hour(s)).  Studies/Results: Dg Chest Port 1 View  10/20/2012  *RADIOLOGY REPORT*  Clinical Data: Shortness of breath and chest pressure; history of asthma and diabetes.  PORTABLE CHEST - 1 VIEW  Comparison: Chest radiograph  performed 09/01/2012  Findings: The lungs are well-aerated.  Mild bibasilar opacities likely reflect atelectasis.  There is no evidence of pleural effusion or pneumothorax.  The cardiomediastinal silhouette is within normal limits.  No acute osseous abnormalities are seen.  IMPRESSION: Mild bibasilar airspace opacities likely reflect atelectasis; lungs otherwise clear.   Original Report Authenticated By: Tonia Ghent, M.D.      Medications: Scheduled Meds:   . albuterol  2.5 mg Nebulization Q4H WA  . aspirin EC  81 mg Oral Daily  . citalopram  40 mg Oral Daily  . enoxaparin (LOVENOX) injection  40 mg Subcutaneous Q24H  . fluticasone  1 spray Each Nare Daily  . fluticasone  2 puff Inhalation BID  . guaiFENesin  1,200 mg Oral BID  . losartan  50 mg Oral Daily   And  . hydrochlorothiazide  12.5 mg Oral Daily  . insulin aspart  0-15 Units Subcutaneous TID WC  . insulin aspart  0-5 Units Subcutaneous QHS  . insulin glargine  30 Units Subcutaneous QHS  . ipratropium  0.5 mg Nebulization Q4H WA  . levofloxacin (LEVAQUIN) IV  750 mg Intravenous QHS  . levothyroxine  175 mcg Oral QAC breakfast  . loratadine  10 mg Oral Daily  . metFORMIN  1,000 mg Oral BID WC  . methylPREDNISolone (SOLU-MEDROL) injection  125 mg Intravenous Q6H  . pantoprazole  40 mg Oral Daily  . simvastatin  10 mg Oral q1800  . sodium chloride  3 mL Intravenous Q12H   Continuous Infusions:   . 0.9 % NaCl with KCl 20 mEq / L 50 mL/hr at 10/20/12 2314   PRN Meds:.acetaminophen, albuterol, ibuprofen, ipratropium, ondansetron (ZOFRAN) IV, sorbitol, traZODone  Assessment/Plan:  Active Problems:  OBESITY  DM type 2 (diabetes mellitus, type 2)  HTN (hypertension)  Hypothyroidism  Elevated LFTs  Moderate persistent asthma with reflux disease  Asthma exacerbation  1. Acute exacerbation of asthma.  Continue with steroids, abx and bronchodilators. Add flutter valve.  2. GERD.  Patient reports significant reflux which may be aggravating asthma.  Will change protonix to bid  3. HTN.  Stable  4. DM2, continue to follow CBGs.  On lantus and SSI  5. Hypothyroidism.  On replacement.  6. Elevated lefts.  Likely due to fatty liver.  Time spent coordinating care:   LOS: 1 day   Malva Diesing [facility] Pager: [phone] 10/21/2012, 1:01 PM

## 2012-10-22 LAB — GLUCOSE, CAPILLARY
Glucose-Capillary: 211 mg/dL — ABNORMAL HIGH (ref 70–99)
Glucose-Capillary: 409 mg/dL — ABNORMAL HIGH (ref 70–99)

## 2012-10-22 MED ORDER — INSULIN ASPART 100 UNIT/ML ~~LOC~~ SOLN
0.0000 [IU] | Freq: Three times a day (TID) | SUBCUTANEOUS | Status: DC
  Administered 2012-10-22: 20 [IU] via SUBCUTANEOUS
  Administered 2012-10-22: 3 [IU] via SUBCUTANEOUS
  Administered 2012-10-23 (×2): 4 [IU] via SUBCUTANEOUS
  Administered 2012-10-23: 7 [IU] via SUBCUTANEOUS
  Administered 2012-10-24 (×2): 4 [IU] via SUBCUTANEOUS

## 2012-10-22 MED ORDER — INSULIN ASPART 100 UNIT/ML ~~LOC~~ SOLN
5.0000 [IU] | Freq: Three times a day (TID) | SUBCUTANEOUS | Status: DC
  Administered 2012-10-22 – 2012-10-24 (×7): 5 [IU] via SUBCUTANEOUS

## 2012-10-22 MED ORDER — INSULIN GLARGINE 100 UNIT/ML ~~LOC~~ SOLN
40.0000 [IU] | Freq: Every day | SUBCUTANEOUS | Status: DC
  Administered 2012-10-22 – 2012-10-23 (×2): 40 [IU] via SUBCUTANEOUS

## 2012-10-22 MED ORDER — HYDROCOD POLST-CHLORPHEN POLST 10-8 MG/5ML PO LQCR
10.0000 mL | Freq: Two times a day (BID) | ORAL | Status: DC | PRN
  Administered 2012-10-22: 10 mL via ORAL
  Filled 2012-10-22 (×2): qty 10

## 2012-10-22 MED ORDER — INSULIN ASPART 100 UNIT/ML ~~LOC~~ SOLN
0.0000 [IU] | Freq: Every day | SUBCUTANEOUS | Status: DC
Start: 2012-10-22 — End: 2012-10-24
  Administered 2012-10-22 – 2012-10-23 (×2): 2 [IU] via SUBCUTANEOUS

## 2012-10-22 MED ORDER — LEVOFLOXACIN 750 MG PO TABS
750.0000 mg | ORAL_TABLET | Freq: Every day | ORAL | Status: DC
  Administered 2012-10-22 – 2012-10-23 (×2): 750 mg via ORAL
  Filled 2012-10-22 (×2): qty 1

## 2012-10-22 NOTE — Progress Notes (Signed)
Pt's CBG 409. Dr. Kerry Hough on floor at this time and verbally made aware. New orders received. Will continue to monitor.

## 2012-10-22 NOTE — Progress Notes (Signed)
[facility]             Progress Note   Subjective: Patient says she feels better today. Breathing is improving. She does have a productive cough. She still short of breath with exertion.  Objective: Vital signs in last 24 hours: Temp:  [97.1 F (36.2 C)-97.9 F (36.6 C)] 97.3 F (36.3 C) (12/31 0457) Pulse Rate:  [95-113] 95  (12/31 0457) Resp:  [20-24] 20  (12/31 0457) BP: (140-161)/(56-89) 149/89 mmHg (12/31 0457) SpO2:  [94 %-97 %] 94 % (12/31 0457) Weight:  [106.777 kg (235 lb 6.4 oz)] 106.777 kg (235 lb 6.4 oz) (12/31 0457) Weight change: 2.903 kg (6 lb 6.4 oz) Last BM Date: 10/21/12  Intake/Output from previous day: 12/30 0701 - 12/31 0700 In: 1811 [P.O.:960; I.V.:701; IV Piggyback:150] Out: 350 [Urine:350] Total I/O In: 360 [P.O.:360] Out: -    Physical Exam: General: Alert, awake, oriented x3, increased work of breathing HEENT: No bruits, no goiter. Heart: s1, s2, RRR Lungs: Clear to auscultation bilaterally. Abdomen: Soft, nontender, nondistended, positive bowel sounds. Extremities: No clubbing cyanosis or edema with positive pedal pulses. Neuro: Grossly intact, nonfocal.    Lab Results: Basic Metabolic Panel:  Basename 10/21/12 0522 10/20/12 2024  NA 140 139  K 3.8 4.2  CL 102 101  CO2 24 28  GLUCOSE 252* 185*  BUN 10 11  CREATININE 0.59 0.66  CALCIUM 9.0 8.9  MG -- --  PHOS -- --   Liver Function Tests:  Basename 10/21/12 0522 10/20/12 2024  AST 29 37  ALT 47* 48*  ALKPHOS 88 90  BILITOT 0.5 0.4  PROT 7.1 6.7  ALBUMIN 3.9 3.7   No results found for this basename: LIPASE:2,AMYLASE:2 in the last 72 hours No results found for this basename: AMMONIA:2 in the last 72 hours CBC:  Basename 10/21/12 0522 10/20/12 2024  WBC 9.5 11.2*  NEUTROABS -- 6.8  HGB 13.3 13.5  HCT 40.8 40.8  MCV 83.1 83.1  PLT 308 288   Cardiac Enzymes:  Basename 10/20/12 2024  CKTOTAL --  CKMB --  CKMBINDEX --  TROPONINI <0.30    BNP:  Basename 10/20/12 2024  PROBNP 32.8   D-Dimer: No results found for this basename: DDIMER:2 in the last 72 hours CBG:  Basename 10/22/12 1121 10/22/12 0735 10/21/12 2142 10/21/12 1635 10/21/12 1114 10/21/12 0725  GLUCAP 409* 211* 212* 241* 210* 244*   Hemoglobin A1C:  Basename 10/20/12 2024  HGBA1C 7.3*   Fasting Lipid Panel: No results found for this basename: CHOL,HDL,LDLCALC,TRIG,CHOLHDL,LDLDIRECT in the last 72 hours Thyroid Function Tests: No results found for this basename: TSH,T4TOTAL,FREET4,T3FREE,THYROIDAB in the last 72 hours Anemia Panel: No results found for this basename: VITAMINB12,FOLATE,FERRITIN,TIBC,IRON,RETICCTPCT in the last 72 hours Coagulation: No results found for this basename: LABPROT:2,INR:2 in the last 72 hours Urine Drug Screen: Drugs of Abuse  No results found for this basename: labopia,  cocainscrnur,  labbenz,  amphetmu,  thcu,  labbarb    Alcohol Level: No results found for this basename: ETH:2 in the last 72 hours Urinalysis:  Basename 10/21/12 0148  COLORURINE YELLOW  LABSPEC >1.030*  PHURINE 6.0  GLUCOSEU >1000*  HGBUR NEGATIVE  BILIRUBINUR NEGATIVE  KETONESUR TRACE*  PROTEINUR NEGATIVE  UROBILINOGEN 0.2  NITRITE NEGATIVE  LEUKOCYTESUR NEGATIVE    No results found for this or any previous visit (from the past 240 hour(s)).  Studies/Results: Dg Chest Port 1 View  10/20/2012  *RADIOLOGY REPORT*  Clinical Data: Shortness of breath and chest pressure; history  of asthma and diabetes.  PORTABLE CHEST - 1 VIEW  Comparison: Chest radiograph performed 09/01/2012  Findings: The lungs are well-aerated.  Mild bibasilar opacities likely reflect atelectasis.  There is no evidence of pleural effusion or pneumothorax.  The cardiomediastinal silhouette is within normal limits.  No acute osseous abnormalities are seen.  IMPRESSION: Mild bibasilar airspace opacities likely reflect atelectasis; lungs otherwise clear.   Original Report  Authenticated By: Tonia Ghent, M.D.     Medications: Scheduled Meds:    . aspirin EC  81 mg Oral Daily  . citalopram  40 mg Oral Daily  . enoxaparin (LOVENOX) injection  40 mg Subcutaneous Q24H  . fluticasone  1 spray Each Nare Daily  . fluticasone  2 puff Inhalation BID  . guaiFENesin  1,200 mg Oral BID  . losartan  50 mg Oral Daily   And  . hydrochlorothiazide  12.5 mg Oral Daily  . insulin aspart  0-15 Units Subcutaneous TID WC  . insulin aspart  0-5 Units Subcutaneous QHS  . insulin glargine  35 Units Subcutaneous QHS  . ipratropium  0.5 mg Nebulization Q4H WA  . levalbuterol  0.63 mg Nebulization Q4H  . levofloxacin  750 mg Oral QHS  . levothyroxine  175 mcg Oral QAC breakfast  . loratadine  10 mg Oral Daily  . metFORMIN  1,000 mg Oral BID WC  . methylPREDNISolone (SOLU-MEDROL) injection  125 mg Intravenous Q6H  . pantoprazole  40 mg Oral BID AC  . simvastatin  10 mg Oral q1800  . sodium chloride  3 mL Intravenous Q12H   Continuous Infusions:    . 0.9 % NaCl with KCl 20 mEq / L 20 mL/hr at 10/21/12 1320   PRN Meds:.acetaminophen, benzonatate, ibuprofen, ipratropium, levalbuterol, ondansetron (ZOFRAN) IV, sorbitol, traZODone  Assessment/Plan:  Active Problems:  OBESITY  DM type 2 (diabetes mellitus, type 2)  HTN (hypertension)  Hypothyroidism  Elevated LFTs  Moderate persistent asthma with reflux disease  Asthma exacerbation  1. Acute exacerbation of asthma.  Patient is starting to improve. Continue with steroids, abx and bronchodilators, pulmonary hygiene.   2. GERD.  Patient reports significant reflux which may be aggravating asthma.  Will change protonix to bid  3. HTN.  Stable  4. DM2, continue to follow CBGs.  Uncontrolled in setting of steroids. We'll increase Lantus and add meal coverage NovoLog.  5. Hypothyroidism.  On replacement.  6. Elevated lefts.  Likely due to fatty liver. Patient is asymptomatic  7. Disposition. Anticipate that these  will be ready for discharge in the next 2-3 days.  Time spent coordinating care:   LOS: 2 days   Jaydon Soroka [facility] Pager: [phone] 10/22/2012, 11:54 AM

## 2012-10-22 NOTE — Progress Notes (Signed)
Inpatient Diabetes Program Recommendations  AACE/ADA: New Consensus Statement on Inpatient Glycemic Control (2013)  Target Ranges:  Prepandial:   less than 140 mg/dL      Peak postprandial:   less than 180 mg/dL (1-2 hours)      Critically ill patients:  140 - 180 mg/dL    Results for SPRING, [name] (MRN [id number]) as of 10/22/2012 10:13  Ref. Range 10/21/2012 07:25 10/21/2012 11:14 10/21/2012 16:35 10/21/2012 21:42  Glucose-Capillary Latest Range: 70-99 mg/dL 782 (H) 956 (H) 213 (H) 212 (H)   Results for [name], [name] (MRN [id number]) as of 10/22/2012 10:13  Ref. Range 10/22/2012 07:35  Glucose-Capillary Latest Range: 70-99 mg/dL 629 (H)    Inpatient Diabetes Program Recommendations Insulin - Basal: Please continue to titrate Lantus upward as needed while patient on IV steroids. Correction (SSI): Currently on Moderate SSI. Insulin - Meal Coverage: Please consider adding meal coverage while patient on IV steroids- Novolog 4 units tid with meals.  Note: Will follow. Ambrose Finland RN, MSN, CDE Diabetes Coordinator Inpatient Diabetes Program [phone]

## 2012-10-22 NOTE — Progress Notes (Signed)
PHARMACIST - PHYSICIAN COMMUNICATION DR:   Memon CONCERNING: Antibiotic IV to Oral Route Change Policy  RECOMMENDATION: This patient is receiving Levaquin by the intravenous route.  Based on criteria approved by the Pharmacy and Therapeutics Committee, the antibiotic(s) is/are being converted to the equivalent oral dose form(s).  DESCRIPTION: These criteria include:  Patient being treated for a respiratory tract infection, urinary tract infection, or cellulitis  The patient is not neutropenic and does not exhibit a GI malabsorption state  The patient is eating (either orally or via tube) and/or has been taking other orally administered medications for a least 24 hours  The patient is improving clinically and has a Tmax < 100.5  If you have questions about this conversion, please contact the Pharmacy Department  [x]  ( [phone] )  [facility] []  ( [phone]  [facility]  []  ( [phone] )  [facility] []  ( [phone]  [facility]   S. Addisynn Vassell, PharmD  

## 2012-10-23 DIAGNOSIS — K219 Gastro-esophageal reflux disease without esophagitis: Secondary | ICD-10-CM | POA: Diagnosis present

## 2012-10-23 LAB — BASIC METABOLIC PANEL
BUN: 24 mg/dL — ABNORMAL HIGH (ref 6–23)
CO2: 27 mEq/L (ref 19–32)
Chloride: 99 mEq/L (ref 96–112)
Creatinine, Ser: 0.65 mg/dL (ref 0.50–1.10)
GFR calc Af Amer: >90 mL/min (ref >90)
Potassium: 4.4 mEq/L (ref 3.5–5.1)

## 2012-10-23 LAB — CBC
HCT: 40.3 % (ref 36.0–46.0)
Hemoglobin: 13.1 g/dL (ref 12.0–15.0)
MCHC: 32.5 g/dL (ref 30.0–36.0)
MCV: 83.4 fL (ref 78.0–100.0)
RDW: 13.6 % (ref 11.5–15.5)

## 2012-10-23 LAB — GLUCOSE, CAPILLARY
Glucose-Capillary: 201 mg/dL — ABNORMAL HIGH (ref 70–99)
Glucose-Capillary: 211 mg/dL — ABNORMAL HIGH (ref 70–99)

## 2012-10-23 MED ORDER — METHYLPREDNISOLONE SODIUM SUCC 125 MG IJ SOLR
60.0000 mg | Freq: Four times a day (QID) | INTRAMUSCULAR | Status: DC
  Administered 2012-10-23 – 2012-10-24 (×5): 60 mg via INTRAVENOUS
  Filled 2012-10-23 (×5): qty 2

## 2012-10-23 MED ORDER — SODIUM CHLORIDE 0.9 % IV SOLN
INTRAVENOUS | Status: DC
  Administered 2012-10-23 – 2012-10-24 (×2): via INTRAVENOUS

## 2012-10-23 MED ORDER — BIOTENE DRY MOUTH MT LIQD
15.0000 mL | Freq: Two times a day (BID) | OROMUCOSAL | Status: DC
  Administered 2012-10-23 – 2012-10-24 (×3): 15 mL via OROMUCOSAL

## 2012-10-23 NOTE — Progress Notes (Signed)
Subjective: The patient continues to feel better. She has less chest congestion and wheezing, but still feels some shortness of breath with ambulation. She has some apprehension about going home today although he was offered.  Objective: Vital signs in last 24 hours: Filed Vitals:   10/23/12 0945 10/23/12 1115 10/23/12 1442 10/23/12 1513  BP: 129/80     Pulse: 91     Temp: 97.6 F (36.4 C)     TempSrc: Oral     Resp: 20     Height:      Weight:      SpO2:  95% 93% 93%    Intake/Output Summary (Last 24 hours) at 10/23/12 1529 Last data filed at 10/23/12 0555  Gross per 24 hour  Intake 1325.66 ml  Output      0 ml  Net 1325.66 ml    Weight change: -1.043 kg (-2 lb 4.8 oz)  Physical exam: General: Obese 57 year old Caucasian woman sitting up in bed, in no acute distress. Lungs: Coarse breath sounds without crackles, but rare/occasional wheezes. Heart: S1, S2, with no murmurs rubs or gallops. Next line abdomen: Positive bowel sounds, soft, obese, nontender, nondistended. Extremities: No pedal edema.  Lab Results: Basic Metabolic Panel:  Basename 10/23/12 0546 10/21/12 0522  NA 135 140  K 4.4 3.8  CL 99 102  CO2 27 24  GLUCOSE 213* 252*  BUN 24* 10  CREATININE 0.65 0.59  CALCIUM 9.2 9.0  MG -- --  PHOS -- --   Liver Function Tests:  Basename 10/21/12 0522 10/20/12 2024  AST 29 37  ALT 47* 48*  ALKPHOS 88 90  BILITOT 0.5 0.4  PROT 7.1 6.7  ALBUMIN 3.9 3.7   No results found for this basename: LIPASE:2,AMYLASE:2 in the last 72 hours No results found for this basename: AMMONIA:2 in the last 72 hours CBC:  Basename 10/23/12 0546 10/21/12 0522 10/20/12 2024  WBC 16.3* 9.5 --  NEUTROABS -- -- 6.8  HGB 13.1 13.3 --  HCT 40.3 40.8 --  MCV 83.4 83.1 --  PLT 301 308 --   Cardiac Enzymes:  Basename 10/20/12 2024  CKTOTAL --  CKMB --  CKMBINDEX --  TROPONINI <0.30   BNP:  Basename 10/20/12 2024  PROBNP 32.8   D-Dimer: No results found for this  basename: DDIMER:2 in the last 72 hours CBG:  Basename 10/23/12 1136 10/23/12 0741 10/22/12 2129 10/22/12 1625 10/22/12 1121 10/22/12 0735  GLUCAP 201* 200* 234* 143* 409* 211*   Hemoglobin A1C:  Basename 10/20/12 2024  HGBA1C 7.3*   Fasting Lipid Panel: No results found for this basename: CHOL,HDL,LDLCALC,TRIG,CHOLHDL,LDLDIRECT in the last 72 hours Thyroid Function Tests: No results found for this basename: TSH,T4TOTAL,FREET4,T3FREE,THYROIDAB in the last 72 hours Anemia Panel: No results found for this basename: VITAMINB12,FOLATE,FERRITIN,TIBC,IRON,RETICCTPCT in the last 72 hours Coagulation: No results found for this basename: LABPROT:2,INR:2 in the last 72 hours Urine Drug Screen: Drugs of Abuse  No results found for this basename: labopia, cocainscrnur, labbenz, amphetmu, thcu, labbarb    Alcohol Level: No results found for this basename: ETH:2 in the last 72 hours Urinalysis:  Basename 10/21/12 0148  COLORURINE YELLOW  LABSPEC >1.030*  PHURINE 6.0  GLUCOSEU >1000*  HGBUR NEGATIVE  BILIRUBINUR NEGATIVE  KETONESUR TRACE*  PROTEINUR NEGATIVE  UROBILINOGEN 0.2  NITRITE NEGATIVE  LEUKOCYTESUR NEGATIVE   Misc. Labs:   Micro: No results found for this or any previous visit (from the past 240 hour(s)).  Studies/Results: No results found.  Medications:  Scheduled:   .  antiseptic oral rinse  15 mL Mouth Rinse BID  . aspirin EC  81 mg Oral Daily  . citalopram  40 mg Oral Daily  . enoxaparin (LOVENOX) injection  40 mg Subcutaneous Q24H  . fluticasone  1 spray Each Nare Daily  . fluticasone  2 puff Inhalation BID  . guaiFENesin  1,200 mg Oral BID  . losartan  50 mg Oral Daily   And  . hydrochlorothiazide  12.5 mg Oral Daily  . insulin aspart  0-20 Units Subcutaneous TID WC  . insulin aspart  0-5 Units Subcutaneous QHS  . insulin aspart  5 Units Subcutaneous TID WC  . insulin glargine  40 Units Subcutaneous QHS  . ipratropium  0.5 mg Nebulization Q4H WA  .  levalbuterol  0.63 mg Nebulization Q4H  . levofloxacin  750 mg Oral QHS  . levothyroxine  175 mcg Oral QAC breakfast  . loratadine  10 mg Oral Daily  . metFORMIN  1,000 mg Oral BID WC  . methylPREDNISolone (SOLU-MEDROL) injection  60 mg Intravenous Q6H  . pantoprazole  40 mg Oral BID AC  . simvastatin  10 mg Oral q1800  . sodium chloride  3 mL Intravenous Q12H   Continuous:   . 0.9 % NaCl with KCl 20 mEq / L 20 mL/hr at 10/22/12 1608   ZOX:[id number], benzonatate, chlorpheniramine-HYDROcodone, ibuprofen, ipratropium, levalbuterol, ondansetron (ZOFRAN) IV, sorbitol, traZODone  Assessment: Active Problems:  OBESITY  DM type 2 (diabetes mellitus, type 2)  HTN (hypertension)  Hypothyroidism  Elevated LFTs  Moderate persistent asthma with reflux disease  Asthma exacerbation   The patient is symptomatically improved, but has some apprehension about being discharged too early because of her recent history of exacerbations and hospitalizations. She ambulated shortly after lunch and did fairly well. I believe it is reasonable to wean her steroids as I have done earlier and to monitor her over the next 24 hours on the steroid taper. Although her capillary blood glucose is elevated, it is much improved. Decreasing the steroids will help with better glycemic control. Her BUN is elevated, likely the consequence of the steroids. Would favor restarting gentle IV fluids.     Plan:   1. IV Solu-Medrol has been tapered from 125 mg to 60 mg every 6 hours. 2. Start gentle IV fluids to help with steroid-induced azotemia. 3. The patient was encouraged to continue to increase her activity as tolerated. 4. Possible discharge tomorrow.    LOS: 3 days   Deandrea Rion 10/23/2012, 3:29 PM

## 2012-10-23 NOTE — Progress Notes (Signed)
Pt. Up to ambulate in halls, oxygen reading off oxygen while walking in halls reading 93-94%, pt. Denies any sob, or any difficultly breathing, pt. Tolerated well.

## 2012-10-24 DIAGNOSIS — K219 Gastro-esophageal reflux disease without esophagitis: Secondary | ICD-10-CM

## 2012-10-24 LAB — GLUCOSE, CAPILLARY: Glucose-Capillary: 178 mg/dL — ABNORMAL HIGH (ref 70–99)

## 2012-10-24 LAB — BASIC METABOLIC PANEL
Calcium: 9 mg/dL (ref 8.4–10.5)
GFR calc non Af Amer: >90 mL/min (ref >90)
Glucose, Bld: 192 mg/dL — ABNORMAL HIGH (ref 70–99)
Sodium: 139 mEq/L (ref 135–145)

## 2012-10-24 MED ORDER — LEVOFLOXACIN 750 MG PO TABS: 750.0000 mg | ORAL_TABLET | Freq: Every day | ORAL | Status: DC

## 2012-10-24 MED ORDER — ALBUTEROL SULFATE HFA 108 (90 BASE) MCG/ACT IN AERS: 2.0000 | INHALATION_SPRAY | Freq: Four times a day (QID) | RESPIRATORY_TRACT | Status: DC

## 2012-10-24 MED ORDER — INSULIN LISPRO 100 UNIT/ML ~~LOC~~ SOLN: SUBCUTANEOUS | Status: DC

## 2012-10-24 MED ORDER — PREDNISONE 10 MG PO TABS: ORAL_TABLET | ORAL | Status: DC

## 2012-10-24 MED ORDER — HYDROCOD POLST-CHLORPHEN POLST 10-8 MG/5ML PO LQCR: 10.0000 mL | Freq: Two times a day (BID) | ORAL | Status: DC | PRN

## 2012-10-24 MED ORDER — INSULIN GLARGINE 100 UNIT/ML ~~LOC~~ SOLN: 40.0000 [IU] | Freq: Every day | SUBCUTANEOUS | Status: DC

## 2012-10-24 MED ORDER — BENZONATATE 200 MG PO CAPS: 200.0000 mg | ORAL_CAPSULE | Freq: Three times a day (TID) | ORAL | Status: DC | PRN

## 2012-10-24 NOTE — Progress Notes (Signed)
Inpatient Diabetes Program Recommendations  AACE/ADA: New Consensus Statement on Inpatient Glycemic Control (2013)  Target Ranges:  Prepandial:   less than 140 mg/dL      Peak postprandial:   less than 180 mg/dL (1-2 hours)      Critically ill patients:  140 - 180 mg/dL   Results for ISBELLA, ARLINE (MRN [id number]) as of 10/24/2012 09:00  Ref. Range 10/23/2012 07:41 10/23/2012 11:36 10/23/2012 16:58 10/23/2012 21:22 10/24/2012 07:39  Glucose-Capillary Latest Range: 70-99 mg/dL 829 (H) 562 (H) 130 (H) 211 (H) 162 (H)   Inpatient Diabetes Program Recommendations Insulin - Basal: Please consider increasing Lantus up to 45 units QHS.  Note: Fasting blood glucose this morning was 162 mg/dl.  Blood glucose ranging from 162-211 mg/dl over the past 24 hours.  Please consider increasing Lantus to 45 units QHS.  Will continue to follow.  Thanks, Orlando Penner, RN, BSN, CCRN Diabetes Coordinator Inpatient Diabetes Program [phone]

## 2012-10-24 NOTE — Progress Notes (Signed)
UR Chart Review Completed  

## 2012-10-24 NOTE — Discharge Summary (Signed)
Physician Discharge Summary  Sarah Pope WUJ:[id number] DOB: 07-26-1956 DOA: 10/20/2012  PCP: Sarah Asa, MD  Admit date: 10/20/2012 Discharge date: 10/24/2012  Time spent: Greater than 30 minutes  Recommendations for Outpatient Follow-up:  1. The patient will followup with her pulmonologist Dr. Delford Pope as previously scheduled. She will followup with her primary care physician Dr. Gerda Pope in one to 2 weeks. She will schedule the followup.  Discharge Diagnoses:  1. Moderate persistent asthma with exacerbation. 2. Gastroesophageal reflux disease. 3. Type 2 diabetes mellitus with worsening hyperglycemia secondary to steroid treatment. Hemoglobin A1c was 7.3. 4. Elevated liver transaminase (mild elevation of ALT), possibly secondary to fatty liver. 5. Hypothyroidism. 6. Hypertension. 7. Obesity. 8. Steroid-induced leukocytosis.   Discharge Condition: Improved.  Diet recommendation: Heart healthy.  Filed Weights   10/20/12 2259 10/22/12 0457 10/23/12 0415  Weight: 104.9 kg (231 lb 4.2 oz) 106.777 kg (235 lb 6.4 oz) 105.733 kg (233 lb 1.6 oz)    History of present illness:  The patient is a 57 year old woman with a history significant for moderate persistent asthma without any evidence of COPD, recent hospitalization for acute tracheobronchitis, GERD, and diabetes mellitus who presented to the emergency department on 05/20/2012 4 chief complaint of shortness of breath and a productive cough with yellow sputum for a couple of days. In the emergency department, she was afebrile and tachycardic with a heart rate of 123 beats per minute. She was oxygenating 89% on room air, but it increased to 98% on 2 L of nasal cannula oxygen. Her lab data were significant for a venous glucose of 185, normal troponin I., ALT of 48, and WBC of 11.2. Her chest x-ray revealed mild bilateral lower lobe atelectasis. Her EKG revealed sinus tachycardia and with nonspecific ST and T-wave abnormalities. She was  admitted for further evaluation and management.  Hospital Course:  The patient was started on bronchodilator therapy with Xopenex and Atrovent. Flovent was substituted for Pulmicort. Steroid therapy was initiated with Solu-Medrol at 125 mg every 6 hours. Levaquin was started empirically. Proton pump inhibitor therapy was continued, but the dosing was increased to twice a day empirically. Antihistamine therapy was continued. Guaifenesin was added to help thin her secretions. Later, Jerilynn Som were ordered as scheduled for cough. When necessary Tussionex was given for persistent coughing. Most of not all of her chronic medications were continued. For steroid induced hyperglycemia superimposed on type 2 diabetes mellitus, sliding scale NovoLog and Lantus were titrated accordingly. Metformin was continued. Oxygen was titrated to comfort and to keep her oxygen saturations at or above 92%. As each day went by, her BUN increased, which was thought to be secondary to steroid-induced azotemia. Therefore, gentle IV fluids were started.  Over the course of the hospitalization, her symptoms subsided. IV Solu-Medrol was titrated down to 60 mg every 6 hours. Her oxygen saturations improved on room air, ranging from 94-98% on room air at the time of discharge. She began to ambulate in the hallway with minimal shortness of breath. She was relatively bronchospasm free at the time of discharge. She remained afebrile and hemodynamically stable.  She was discharged to home on her usual bronchodilator regimen in addition to Levaquin for 3 more days and a prednisone taper to be tapered over the next 10-14 days. She was given another sliding scale regimen to follow at home due to to her increasing blood glucose on steroid therapy. Lantus was increased as well. She was advised to followup with her pulmonologist, Dr. Delford Pope or colleague  as scheduled next week.  Procedures:  None  Consultations:  None  Discharge  Exam: Filed Vitals:   10/23/12 2100 10/24/12 0200 10/24/12 0500 10/24/12 0718  BP: 150/72 146/74 148/84   Pulse: 84 84 79   Temp: 97.6 F (36.4 C) 97.1 F (36.2 C) 97.7 F (36.5 C)   TempSrc: Oral Oral Oral   Resp: 20 16 18    Height:      Weight:      SpO2: 96% 98% 95% 94%    General: No acute distress. Overall, she looks better. Cardiovascular: S1, S2, with no murmurs rubs or gallops. Respiratory: Clear to auscultation bilaterally with no audible wheezes or crackles. Breathing is nonlabored.  Discharge Instructions  Discharge Orders    Future Appointments: Provider: Department: Dept Phone: Center:   10/30/2012 11:00 AM Julio Sicks, NP [facility] [phone] None     Future Orders Please Complete By Expires   Diet - low sodium heart healthy      Diet Carb Modified      Increase activity slowly      Discharge instructions      Comments:   Take medications as prescribed.       Medication List     As of 10/24/2012  5:22 PM    TAKE these medications         acetaminophen 325 MG tablet   Commonly known as: TYLENOL   Take 2 tablets (650 mg total) by mouth every 6 (six) hours as needed for pain or fever.      albuterol (2.5 MG/3ML) 0.083% nebulizer solution   Commonly known as: PROVENTIL   Take 3 mLs (2.5 mg total) by nebulization every 4 (four) hours as needed for wheezing or shortness of breath. Shortness of Breath/ Tightness in Chest      albuterol 108 (90 BASE) MCG/ACT inhaler   Commonly known as: PROVENTIL HFA;VENTOLIN HFA   Inhale 2 puffs into the lungs 4 (four) times daily. 2 PUFFS 4 TIMES DAILY SCHEDULED FOR ONE WEEK AND THEN EVERY 4 HOURS AS NEEDED THEREAFTER.      aspirin EC 81 MG tablet   Take 81 mg by mouth daily.      benzonatate 200 MG capsule   Commonly known as: TESSALON   Take 1 capsule (200 mg total) by mouth 3 (three) times daily as needed for cough.      budesonide 180 MCG/ACT inhaler   Commonly known as: PULMICORT   Inhale 2  puffs into the lungs 2 (two) times daily.      cetirizine 10 MG tablet   Commonly known as: ZYRTEC   Take 10 mg by mouth daily.      chlorpheniramine-HYDROcodone 10-8 MG/5ML Lqcr   Commonly known as: TUSSIONEX   Take 10 mLs by mouth every 12 (twelve) hours as needed (COUGH).      citalopram 40 MG tablet   Commonly known as: CELEXA   Take 40 mg by mouth daily.      insulin glargine 100 UNIT/ML injection   Commonly known as: LANTUS   Inject 40 Units into the skin at bedtime. The dose has been increased to 40 units.      insulin lispro 100 UNIT/ML injection   Commonly known as: HUMALOG   Check your blood sugars at least twice daily preferably 3 times daily. Sliding scale: For a blood sugar of 121-150 give yourself 3 units; for blood sugar of 151-200 give 4 units; for blood sugar of 201-250  give 6 units; for blood sugar of 251-300 give 9 units; for blood sugar of 301-350 give 13 units; for blood glucose of 351-400 give 17 units.      levofloxacin 750 MG tablet   Commonly known as: LEVAQUIN   Take 1 tablet (750 mg total) by mouth at bedtime. Antibiotic to be taken for 3 more days      levothyroxine 175 MCG tablet   Commonly known as: SYNTHROID, LEVOTHROID   Take 175 mcg by mouth daily.      losartan-hydrochlorothiazide 50-12.5 MG per tablet   Commonly known as: HYZAAR   Take 1 tablet by mouth daily.      metFORMIN 1000 MG tablet   Commonly known as: GLUCOPHAGE   Take 1,000 mg by mouth 2 (two) times daily with a meal.      mometasone 50 MCG/ACT nasal spray   Commonly known as: NASONEX   Place 2 sprays into the nose daily. Congestion      oxazepam 10 MG capsule   Commonly known as: SERAX   Take 10 mg by mouth at bedtime as needed. Anxiety      pantoprazole 40 MG tablet   Commonly known as: PROTONIX   Take 40 mg by mouth daily.      pravastatin 20 MG tablet   Commonly known as: PRAVACHOL   Take 20 mg by mouth daily.      predniSONE 10 MG tablet   Commonly known as:  DELTASONE   Starting tomorrow take 6 tablets for 2 days; then 5 tablets for 2 days; then 4 tablets for 2 days; then 3 tablets for 1 day; then 2 tablets for 1 day; then 1 tablet for one day; then stop.           Follow-up Information    Follow up with Shan Levans, MD. (Followup as scheduled.)    Contact information:   [address] [place] [address] [phone]       Follow up with Sarah Asa, MD. On 11/08/2012. (10:00 am)    Contact information:   [address] [place] [address] [phone]           The results of significant diagnostics from this hospitalization (including imaging, microbiology, ancillary and laboratory) are listed below for reference.    Significant Diagnostic Studies: Dg Chest Port 1 View  10/20/2012  *RADIOLOGY REPORT*  Clinical Data: Shortness of breath and chest pressure; history of asthma and diabetes.  PORTABLE CHEST - 1 VIEW  Comparison: Chest radiograph performed 09/01/2012  Findings: The lungs are well-aerated.  Mild bibasilar opacities likely reflect atelectasis.  There is no evidence of pleural effusion or pneumothorax.  The cardiomediastinal silhouette is within normal limits.  No acute osseous abnormalities are seen.  IMPRESSION: Mild bibasilar airspace opacities likely reflect atelectasis; lungs otherwise clear.   Original Report Authenticated By: Tonia Ghent, M.D.     Microbiology: No results found for this or any previous visit (from the past 240 hour(s)).   Labs: Basic Metabolic Panel:  Lab 10/24/12 3244 10/23/12 0546 10/21/12 0522 10/20/12 2024  NA 139 135 140 139  K 4.1 4.4 3.8 4.2  CL 101 99 102 101  CO2 29 27 24 28   GLUCOSE 192* 213* 252* 185*  BUN 25* 24* 10 11  CREATININE 0.66 0.65 0.59 0.66  CALCIUM 9.0 9.2 9.0 8.9  MG -- -- -- --  PHOS -- -- -- --  Liver Function Tests:  Lab 10/21/12 0522 10/20/12 2024  AST 29 37  ALT 47* 48*  ALKPHOS 88 90  BILITOT 0.5 0.4  PROT  7.1 6.7  ALBUMIN 3.9 3.7   No results found for this basename: LIPASE:5,AMYLASE:5 in the last 168 hours No results found for this basename: AMMONIA:5 in the last 168 hours CBC:  Lab 10/23/12 0546 10/21/12 0522 10/20/12 2024  WBC 16.3* 9.5 11.2*  NEUTROABS -- -- 6.8  HGB 13.1 13.3 13.5  HCT 40.3 40.8 40.8  MCV 83.4 83.1 83.1  PLT 301 308 288   Cardiac Enzymes:  Lab 10/20/12 2024  CKTOTAL --  CKMB --  CKMBINDEX --  TROPONINI <0.30   BNP: BNP (last 3 results)  Basename 10/20/12 2024 09/01/12 0823  PROBNP 32.8 229.4*   CBG:  Lab 10/24/12 1108 10/24/12 0739 10/23/12 2122 10/23/12 1658 10/23/12 1136  GLUCAP 178* 162* 211* 192* 201*       Signed:  Ilma Achee  [facility] 10/24/2012, 5:22 PM

## 2012-10-24 NOTE — Progress Notes (Signed)
IV removed intact with no difficulty. Telemetry removed. Prescriptions and discharge instructions given using teach back method. Voiced understanding. Husband in room. Transported to front entrance per wheelchair. Vitals stable. No questions.

## 2012-10-30 ENCOUNTER — Ambulatory Visit (INDEPENDENT_AMBULATORY_CARE_PROVIDER_SITE_OTHER): Payer: Self-pay | Admitting: Adult Health

## 2012-10-30 ENCOUNTER — Encounter: Payer: Self-pay | Admitting: Adult Health

## 2012-10-30 VITALS — BP 110/66 | Temp 98.7°F | Ht 65.0 in | Wt 227.2 lb

## 2012-10-30 DIAGNOSIS — R0600 Dyspnea, unspecified: Secondary | ICD-10-CM

## 2012-10-30 DIAGNOSIS — J45909 Unspecified asthma, uncomplicated: Secondary | ICD-10-CM

## 2012-10-30 DIAGNOSIS — R000 Tachycardia, unspecified: Secondary | ICD-10-CM

## 2012-10-30 DIAGNOSIS — R0609 Other forms of dyspnea: Secondary | ICD-10-CM

## 2012-10-30 MED ORDER — HYDROCODONE-HOMATROPINE 5-1.5 MG/5ML PO SYRP: 5.0000 mL | ORAL_SOLUTION | Freq: Four times a day (QID) | ORAL | Status: AC | PRN

## 2012-10-30 NOTE — Patient Instructions (Addendum)
Finish Prednisone taper  Delsym 2 tsp Twice daily  For cough  Tessalon Three times a day  For cough  Hydromet 1-2 tsp every 4-6 hr As needed  Cough  Omeprazole 20mg  daily  We are setting you up for an echo of your heart  We are setting you up for an overnight oximetry test .  follow up [name]  In 3-4 weeks with PFT

## 2012-10-30 NOTE — Progress Notes (Signed)
Subjective:    Patient ID: Sarah Pope Hidden, female    DOB: Jul 28, 1956, 57 y.o.   MRN: [id number]  HPI Comments: 57 yo female , former smoker, seen for initial pulmonary consult 10/09/12 [name]  For  Asthma dx.    10/09/12  Initial consult Complains of Symptoms for 3-4 months.  Pt notes dyspnea to point of hospital stays x 2 in two months.  Issues at rest and exertion she complains of chest tightness, cough, difficulty breathing, frequent throat clearing, hoarse voice, shortness of breath, sputum production and wheezing. There is no hemoptysis. Primary symptoms comments: Mucus is yellow. This is a new problem. The current episode started more than 1 month ago. The problem occurs daily. The problem has been gradually improving. The cough is productive of purulent sputum, dry, barking, croupy, harsh, productive, hoarse, nocturnal, paroxysmal and supine. Associated symptoms include chest pain, dyspnea on exertion, headaches, malaise/fatigue, orthopnea, PND, postnasal drip, sneezing, a sore throat and trouble swallowing. Pertinent negatives include no appetite change, ear congestion, ear pain, fever, myalgias, nasal congestion, rhinorrhea, sweats or weight loss. Her symptoms are aggravated by eating, climbing stairs, any activity, exercise, change in weather, lying down, exposure to smoke, exposure to fumes, emotional stress and strenuous activity. Her symptoms are alleviated by steroid inhaler, oral steroids and beta-agonist. She reports significant improvement on treatment. Her past medical history is significant for asthma. There is no history of bronchiectasis, bronchitis, COPD, emphysema or pneumonia.  >>Spirometry 09/09/2012: FEV1 71% FVC 77% FEV1 FEC ratio 76% FEF 25 7566% consistent with restriction Moderate persistent asthma with upper airway instability   10/30/2012 Follow up  Returns for 2 weeks follow up for asthma  Seen for initial consult for asthma last ov, changed from advair to  pulmicort and ACE changed to ARB. 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 3 admission in 3 months Hypercarbic during one admit requiring BIPAP .  Cardiac enzymes neg , BNP not elevated last admission, minimally elevated ~200 prev admits.  She does have daytime sleepiness on occasion. Never had OSA work up in past.  No significant desats with ambulation in office however had to stop to dyspnea and HR 130-140 , returned to baseline at rest.   She was  discharged from hosp 1/2 w/ abx extension and pred taper, curently taking 4 tabs daily.  still SOB, some tightness, prod cough with yellow mucus, head congestion with yellow drainage, PND, sore throat.  reports is some improved since discharge.     Review of Systems     Constitutional:   No  weight loss, night sweats,  Fevers, chills, + fatigue, or  lassitude.  HEENT:   No headaches,  Difficulty swallowing,  Tooth/dental problems, or  Sore throat,                No sneezing, itching, ear ache,  +nasal congestion, post nasal drip,   CV:  No chest pain,  Orthopnea, PND, swelling in lower extremities, anasarca, dizziness, palpitations, syncope.   GI  No heartburn, indigestion, abdominal pain, nausea, vomiting, diarrhea, change in bowel habits, loss of appetite, bloody stools.   Resp:  No coughing up of blood.No chest wall deformity  Skin: no rash or lesions.  GU:  no dysuria, change in color of urine, no urgency or frequency.  No flank pain, no hematuria   MS:  No joint pain or swelling.  No decreased range of motion.     Psych:  No change in mood or affect. No depression or anxiety.  No memory loss.      Objective:   Physical Exam  Gen: Pleasant,  in no  distress,  normal affect  ENT: No lesions,  mouth clear,  oropharynx clear  Neck: No JVD, no TMG, no carotid bruits  Lungs: No use of accessory muscles, no dullness to percussion, diminished BS in bases   Cardiovascular: RRR, heart sounds normal, no murmur or gallops, no peripheral edema  Abdomen: soft and NT, no HSM,  BS normal  Musculoskeletal: No deformities, no cyanosis or clubbing  Neuro: alert, non focal  Skin: Warm, no lesions or rashes    Chest x-ray reviewed and shows low lung volumes in no acute infiltrates from November 2013  Spirometry 09/09/2012: FEV1 71% FVC 77% FEV1 FEC ratio 76% FEF 25 7566% consistent with restriction     Assessment & Plan:

## 2012-11-01 ENCOUNTER — Ambulatory Visit (HOSPITAL_COMMUNITY)
Admission: RE | Admit: 2012-11-01 | Discharge: 2012-11-01 | Disposition: A | Discharge status: Home / Self Care | Payer: Medicaid Other | Source: Ambulatory Visit | Attending: Adult Health | Admitting: Adult Health

## 2012-11-01 DIAGNOSIS — R000 Tachycardia, unspecified: Secondary | ICD-10-CM | POA: Insufficient documentation

## 2012-11-01 DIAGNOSIS — J449 Chronic obstructive pulmonary disease, unspecified: Secondary | ICD-10-CM | POA: Insufficient documentation

## 2012-11-01 DIAGNOSIS — R0989 Other specified symptoms and signs involving the circulatory and respiratory systems: Secondary | ICD-10-CM

## 2012-11-01 DIAGNOSIS — I10 Essential (primary) hypertension: Secondary | ICD-10-CM | POA: Insufficient documentation

## 2012-11-01 DIAGNOSIS — R0609 Other forms of dyspnea: Secondary | ICD-10-CM | POA: Insufficient documentation

## 2012-11-01 DIAGNOSIS — R0600 Dyspnea, unspecified: Secondary | ICD-10-CM

## 2012-11-01 DIAGNOSIS — J4489 Other specified chronic obstructive pulmonary disease: Secondary | ICD-10-CM | POA: Insufficient documentation

## 2012-11-01 DIAGNOSIS — E119 Type 2 diabetes mellitus without complications: Secondary | ICD-10-CM | POA: Insufficient documentation

## 2012-11-01 NOTE — Progress Notes (Signed)
*  PRELIMINARY RESULTS* Echocardiogram 2D Echocardiogram has been performed.  Sarah Pope 11/01/2012, 9:02 AM

## 2012-11-06 ENCOUNTER — Encounter: Payer: Self-pay | Admitting: Adult Health

## 2012-11-06 NOTE — Progress Notes (Signed)
Quick Note:  Called spoke with patient, advised of 2D echo results / recs as stated by TP. Pt verbalized her understanding and denied any questions. ______

## 2012-11-06 NOTE — Progress Notes (Signed)
Quick Note:  Results faxed to Dr Gerda Diss via biscom ______

## 2012-11-06 NOTE — Assessment & Plan Note (Signed)
Recurrent exacerbation w/ significant dyspnea out of proportion to lung fxn.  ? Underlying sleep disorder ? OHS w/ previous admission w/ hypercarbia  Will have her return for PFT  Set up for ONO , if positive consider sleep study.  Check echo to look for cardiac source.  Cont off ACE    Plan Finish Prednisone taper  Delsym 2 tsp Twice daily  For cough  Tessalon Three times a day  For cough  Hydromet 1-2 tsp every 4-6 hr As needed  Cough  Omeprazole 20mg  daily  We are setting you up for an echo of your heart  We are setting you up for an overnight oximetry test .  follow up [name]  In 3-4 weeks with PFT

## 2012-11-19 ENCOUNTER — Encounter: Payer: Self-pay | Admitting: Adult Health

## 2012-11-20 ENCOUNTER — Ambulatory Visit: Payer: Self-pay | Admitting: Critical Care Medicine

## 2012-11-20 ENCOUNTER — Inpatient Hospital Stay (HOSPITAL_COMMUNITY): Admission: RE | Admit: 2012-11-20 | Payer: Self-pay | Source: Ambulatory Visit

## 2012-11-20 ENCOUNTER — Other Ambulatory Visit: Payer: Self-pay | Admitting: Adult Health

## 2012-11-20 DIAGNOSIS — J45909 Unspecified asthma, uncomplicated: Secondary | ICD-10-CM

## 2012-11-27 ENCOUNTER — Encounter: Payer: Self-pay | Admitting: Adult Health

## 2012-11-29 ENCOUNTER — Ambulatory Visit: Payer: BC Managed Care – PPO | Attending: Adult Health | Admitting: Sleep Medicine

## 2012-11-29 DIAGNOSIS — J45909 Unspecified asthma, uncomplicated: Secondary | ICD-10-CM

## 2012-11-29 DIAGNOSIS — Z6837 Body mass index (BMI) 37.0-37.9, adult: Secondary | ICD-10-CM | POA: Insufficient documentation

## 2012-11-29 DIAGNOSIS — G4733 Obstructive sleep apnea (adult) (pediatric): Secondary | ICD-10-CM | POA: Insufficient documentation

## 2012-12-01 NOTE — Procedures (Signed)
[facility] Sarah Pope A. Gerilyn Pilgrim, MD     [email or web address]        [name], [name]               ACCOUNT NO.:  [id number]  MEDICAL RECORD NO.:  [id number]          PATIENT TYPE:  OUT  LOCATION:  SLEEP LAB                     FACILITY:  [facility]  PHYSICIAN:  Sarah Pope, M.D. DATE OF BIRTH:  1956/05/16  DATE OF STUDY:  11/29/2012                           NOCTURNAL POLYSOMNOGRAM  REFERRING PHYSICIAN:  Rubye Oaks, NP  INDICATION:  This is a 57 year old lady who presents with hypersomnia, obesity, witnessed apneas snoring.   MEDICATIONS:  Metformin.  EPWORTH SLEEPINESS SCALE:  10.  BMI:  37.  ARCHITECTURAL SUMMARY:  This is a split night recording with initial portion being a diagnostic and second portion a titration recording. The total recording time is 477 minutes.  Sleep efficiency 69%.  Sleep latency 55 minutes.  REM latency 314 minute.  RESPIRATORY SUMMARY:  Baseline oxygen saturation is 96, lowest saturation 86 during non-REM sleep.  Diagnostic AHI is 45.  The patient was placed on positive pressure and treated between pressures of 5 and 7 optimal pressure 7 with resolution of obstructive events and good tolerance.  LIMB MOVEMENT SUMMARY:  PLM index is 0.7.  ELECTROCARDIOGRAM SUMMARY:  Average heart rate 92 with no significant dysrhythmias observed.  IMPRESSION:  Severe obstructive sleep apnea syndrome, which responds well to a CPAP of 7.    Mical Kicklighter A. Gerilyn Pope, M.D.    KAD/MEDQ  D:  12/01/2012 17:45:28  T:  12/01/2012 18:09:11  Job:  [id number]

## 2012-12-02 ENCOUNTER — Encounter: Payer: Self-pay | Admitting: Adult Health

## 2012-12-02 ENCOUNTER — Telehealth: Payer: Self-pay | Admitting: Adult Health

## 2012-12-02 DIAGNOSIS — G4733 Obstructive sleep apnea (adult) (pediatric): Secondary | ICD-10-CM | POA: Insufficient documentation

## 2012-12-02 NOTE — Telephone Encounter (Signed)
Sleep study + for OSA Set up for new MD consult for sleep- dx OSA

## 2012-12-02 NOTE — Telephone Encounter (Signed)
LM w/ sig other TCB x1. Need to give sleep study results > +OSA and schedule consult w/ sleep doc.

## 2012-12-11 NOTE — Telephone Encounter (Signed)
Called spoke with patient, advised of sleep study results / recs as stated by TP.  Pt okay with these recs and verbalized her understanding.  Sleep consult scheduled with KC for 3.12.14 @ 1100.  Pt only able to do Wednesdays.  Pt aware to arrive early for paperwork.  Nothing further needed; will sign off.

## 2013-01-01 ENCOUNTER — Encounter: Payer: Self-pay | Admitting: Pulmonary Disease

## 2013-01-01 ENCOUNTER — Ambulatory Visit (INDEPENDENT_AMBULATORY_CARE_PROVIDER_SITE_OTHER): Payer: BC Managed Care – PPO | Admitting: Pulmonary Disease

## 2013-01-01 VITALS — BP 120/80 | HR 83 | Temp 98.3°F | Ht 65.0 in | Wt 231.0 lb

## 2013-01-01 DIAGNOSIS — G4733 Obstructive sleep apnea (adult) (pediatric): Secondary | ICD-10-CM

## 2013-01-01 NOTE — Patient Instructions (Addendum)
Will start you on cpap.  Please call if you are having issues with tolerance. Work on weight loss followup with me in 6 weeks.

## 2013-01-01 NOTE — Progress Notes (Signed)
Subjective:    Patient ID: Sarah Pope, female    DOB: Nov 25, 1955, 57 y.o.   MRN: [id number]  HPI The patient is a 57 year old female who I've been asked to see for management of obstructive sleep apnea.  She has had a recent sleep study that showed severe OSA, with an AHI of 45 events per hour.  She underwent CPAP titration, however had very little supine sleep on the final setting and little time for pressure optimization.  The patient has been noted to have loud snoring, but no one has mentioned an abnormal breathing pattern during sleep.  She has frequent awakenings at night, and has not rested in the mornings upon arising.  She notes definite sleep pressure with inactivity during the day, and will fall asleep easily watching television in the evening.  She denies any sleepiness with driving.  The patient states that her weight is up 15 pounds over the last 2 years, and her Epworth score today is 7.  Sleep Questionnaire What time do you typically go to bed?( Between what hours) 10-11pm 10-11pm at 1138 on 01/01/13 by Caryl Ada, CMA How long does it take you to fall asleep? 1 hour 1 hour at 1138 on 01/01/13 by Caryl Ada, CMA How many times during the night do you wake up? 2 2 at 1138 on 01/01/13 by Caryl Ada, CMA What time do you get out of bed to start your day? 0500 0500 at 1138 on 01/01/13 by Caryl Ada, CMA Do you drive or operate heavy machinery in your occupation? No No at 1138 on 01/01/13 by Caryl Ada, CMA How much has your weight changed (up or down) over the past two years? (In pounds) 0 oz (0 kg) 0 oz (0 kg) at 1138 on 01/01/13 by Caryl Ada, CMA Have you ever had a sleep study before? Yes Yes at 1138 on 01/01/13 by Caryl Ada, CMA If yes, location of study? [facility] at 5284 on 01/01/13 by Caryl Ada, CMA If yes, date of study? few weeks ago few weeks ago at 1138 on 01/01/13 by Caryl Ada, CMA Do you currently use CPAP? No No at 1138 on 01/01/13 by Caryl Ada, CMA Do you wear oxygen at any time? No No at 1138 on 01/01/13 by Caryl Ada, CMA    Review of Systems  Constitutional: Negative for fever and unexpected weight change.  HENT: Negative for ear pain, nosebleeds, congestion, sore throat, rhinorrhea, sneezing, trouble swallowing, dental problem, postnasal drip and sinus pressure.   Eyes: Negative for redness and itching.  Respiratory: Positive for shortness of breath. Negative for cough, chest tightness and wheezing.   Cardiovascular: Negative for palpitations and leg swelling.  Gastrointestinal: Negative for nausea and vomiting.  Genitourinary: Negative for dysuria.  Musculoskeletal: Positive for joint swelling.  Skin: Negative for rash.  Neurological: Negative for headaches.  Hematological: Does not bruise/bleed easily.  Psychiatric/Behavioral: Positive for dysphoric mood. The patient is not nervous/anxious.        Objective:   Physical Exam Constitutional:  Obese female, no acute distress  HENT:  Nares patent without discharge  Oropharynx without exudate, palate and uvula are thickened and elongated.   Eyes:  Perrla, eomi, no scleral icterus  Neck:  No JVD, no TMG  Cardiovascular:  Normal rate, regular rhythm, no rubs or gallops.  No murmurs        Intact distal pulses  Pulmonary :  Normal breath sounds, no stridor or respiratory distress   No rales, rhonchi, or wheezing  Abdominal:  Soft, nondistended, bowel sounds present.  No tenderness noted.   Musculoskeletal:  1+ lower extremity edema noted.  Lymph Nodes:  No cervical lymphadenopathy noted  Skin:  No cyanosis noted  Neurologic:  Appears tired, appropriate, moves all 4 extremities without obvious deficit.         Assessment & Plan:

## 2013-01-01 NOTE — Assessment & Plan Note (Signed)
The patient has severe objective sleep apnea by her recent study, and has significant nocturnal and daytime symptoms.  I have had a long discussion with her about the pathophysiology of sleep apnea, including its impact to her quality of life and cardiovascular health.  I have recommended a trial of CPAP while she is working on weight loss, and the patient is agreeable. I will set the patient up on cpap at a moderate pressure level to allow for desensitization, and will troubleshoot the device over the next 4-6weeks if needed.  The pt is to call me if having issues with tolerance.  Will then optimize the pressure once patient is able to wear cpap on a consistent basis.

## 2013-01-03 ENCOUNTER — Ambulatory Visit (HOSPITAL_COMMUNITY)
Admission: RE | Admit: 2013-01-03 | Discharge: 2013-01-03 | Disposition: A | Discharge status: Home / Self Care | Payer: BC Managed Care – PPO | Source: Ambulatory Visit | Attending: Adult Health | Admitting: Adult Health

## 2013-01-03 ENCOUNTER — Encounter: Payer: Self-pay | Admitting: Critical Care Medicine

## 2013-01-03 ENCOUNTER — Ambulatory Visit (INDEPENDENT_AMBULATORY_CARE_PROVIDER_SITE_OTHER): Payer: BC Managed Care – PPO | Admitting: Critical Care Medicine

## 2013-01-03 VITALS — BP 100/66 | HR 100 | Temp 98.1°F | Ht 65.0 in | Wt 231.0 lb

## 2013-01-03 DIAGNOSIS — R0989 Other specified symptoms and signs involving the circulatory and respiratory systems: Secondary | ICD-10-CM | POA: Insufficient documentation

## 2013-01-03 DIAGNOSIS — R0600 Dyspnea, unspecified: Secondary | ICD-10-CM

## 2013-01-03 DIAGNOSIS — R059 Cough, unspecified: Secondary | ICD-10-CM | POA: Insufficient documentation

## 2013-01-03 DIAGNOSIS — R062 Wheezing: Secondary | ICD-10-CM | POA: Insufficient documentation

## 2013-01-03 DIAGNOSIS — J45909 Unspecified asthma, uncomplicated: Secondary | ICD-10-CM

## 2013-01-03 DIAGNOSIS — R0609 Other forms of dyspnea: Secondary | ICD-10-CM | POA: Insufficient documentation

## 2013-01-03 DIAGNOSIS — G4733 Obstructive sleep apnea (adult) (pediatric): Secondary | ICD-10-CM

## 2013-01-03 LAB — PULMONARY FUNCTION TEST

## 2013-01-03 MED ORDER — ALBUTEROL SULFATE (5 MG/ML) 0.5% IN NEBU
2.5000 mg | INHALATION_SOLUTION | Freq: Once | RESPIRATORY_TRACT | Status: AC
  Administered 2013-01-03: 2.5 mg via RESPIRATORY_TRACT

## 2013-01-03 NOTE — Progress Notes (Signed)
Subjective:    Patient ID: Sarah Pope, female    DOB: 1956-08-04, 57 y.o.   MRN: [id number]  HPI  57 yo female , former smoker, seen for initial pulmonary consult 10/09/12 [name]  For  Asthma dx.    10/09/12  Initial consult Complains of Symptoms for 3-4 months.  Pt notes dyspnea to point of hospital stays x 2 in two months.  Issues at rest and exertion she complains of chest tightness, cough, difficulty breathing, frequent throat clearing, hoarse voice, shortness of breath, sputum production and wheezing. There is no hemoptysis. Primary symptoms comments: Mucus is yellow. This is a new problem. The current episode started more than 1 month ago. The problem occurs daily. The problem has been gradually improving. The cough is productive of purulent sputum, dry, barking, croupy, harsh, productive, hoarse, nocturnal, paroxysmal and supine. Associated symptoms include chest pain, dyspnea on exertion, headaches, malaise/fatigue, orthopnea, PND, postnasal drip, sneezing, a sore throat and trouble swallowing. Pertinent negatives include no appetite change, ear congestion, ear pain, fever, myalgias, nasal congestion, rhinorrhea, sweats or weight loss. Her symptoms are aggravated by eating, climbing stairs, any activity, exercise, change in weather, lying down, exposure to smoke, exposure to fumes, emotional stress and strenuous activity. Her symptoms are alleviated by steroid inhaler, oral steroids and beta-agonist. She reports significant improvement on treatment. Her past medical history is significant for asthma. There is no history of bronchiectasis, bronchitis, COPD, emphysema or pneumonia.  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 3 admission in 3 months Hypercarbic during one admit requiring BIPAP .  Cardiac enzymes neg , BNP not elevated last admission, minimally elevated ~200 prev admits.  She does have daytime sleepiness on occasion. Never had OSA work up in past.  No significant desats with ambulation in office however had to stop to dyspnea and HR 130-140 , returned to baseline at rest.   She was  discharged from hosp 1/2 w/ abx extension and pred taper, curently taking 4 tabs daily.  still SOB, some tightness, prod cough with yellow mucus, head congestion with yellow drainage, PND, sore throat.  reports is some improved since discharge.  01/03/2013 FIrst seen 09/2012 for copd/asthma.  TP saw pt 10/30/12 Pt was admitted hosp 12/29 for asthma exac 3 admit in 3 months.  D/c 10/24/12 with abx/pred taper Now here for f/u .    Rec at NP visit was  Recurrent exacerbation w/ significant dyspnea out of proportion to lung fxn.  ? Underlying sleep disorder ? OHS w/ previous admission w/ hypercarbia  Will have her return for PFT  Set up for ONO , if positive consider sleep study.  Check echo to look for cardiac source.  Cont off ACE  Plan  Finish Prednisone taper  Delsym 2 tsp Twice daily For cough  Tessalon Three times a day For cough  Hydromet 1-2 tsp every 4-6 hr As needed  Cough  Omeprazole 20mg  daily  We are setting you up for an echo of your heart  We are setting you up for an overnight oximetry test .  Pt did have desat and saw sleep MD [facility] 01/01/13.  Not yet got cpap machine  Dx Severe OSA AND now on cpap Had pfts today:  Normal spiro, normal dlco/tlc Echo normal Dyspnea is  better, less cough is noted No real chest pain    Review of Systems 11 pt ros neg except as per HPI     Objective:   Physical Exam Filed Vitals:   01/03/13 1120  BP: 100/66  Pulse: 100  Temp: 98.1 F (36.7 C)  TempSrc: Oral  Height: 5\' 5"  (1.651 m)  Weight: 231 lb (104.781 kg)  SpO2: 95%    Gen: Pleasant, well-nourished, in no distress,  normal affect  ENT: No lesions,  mouth clear,  oropharynx clear, no postnasal drip  Neck: No JVD, no TMG, no carotid bruits  Lungs: No use of accessory muscles, no dullness to percussion, clear without rales or rhonchi  Cardiovascular: RRR, heart sounds normal, no murmur or gallops, no peripheral edema  Abdomen: soft and NT, no HSM,  BS normal  Musculoskeletal: No deformities, no cyanosis or clubbing  Neuro: alert, non focal  Skin: Warm, no lesions or rashes  No results found.        Assessment & Plan:   Moderate persistent asthma with reflux disease Severe persistent asthma stable at present Plan  Cont inhaled meds  Needs cpap asap  OSA (obstructive sleep apnea) Severe osa with AHI 45/hr.  Cpap not obtained as of yet but ordered 3/12 Plan Get cpap for the patient    Updated Medication List Outpatient Encounter Prescriptions as of 01/03/2013  Medication Sig Dispense Refill  . acetaminophen (TYLENOL) 325 MG tablet Take 2 tablets (650 mg total) by mouth every 6 (six) hours as needed for pain or fever.      Marland Kitchen albuterol (PROVENTIL HFA;VENTOLIN HFA) 108 (90 BASE) MCG/ACT inhaler Inhale 2 puffs into the lungs 4 (four) times daily. 2 PUFFS 4 TIMES DAILY SCHEDULED FOR ONE WEEK AND THEN EVERY 4 HOURS AS NEEDED THEREAFTER.      Marland Kitchen albuterol (PROVENTIL) (2.5 MG/3ML) 0.083% nebulizer solution Take 3 mLs (2.5 mg total) by nebulization every 4 (four) hours as needed for wheezing or shortness of breath. Shortness of Breath/ Tightness in Chest  75 mL  3  . aspirin EC 81 MG tablet Take 81 mg by mouth daily.      . benzonatate  (TESSALON) 200 MG capsule Take 1 capsule (200 mg total) by mouth 3 (three) times daily as needed for cough.  20 capsule  0  . budesonide (PULMICORT FLEXHALER) 180 MCG/ACT inhaler Inhale 2 puffs into the lungs 2 (two) times daily.  1 Inhaler  6  . cetirizine (ZYRTEC) 10 MG tablet Take 10 mg by mouth daily.      . chlorpheniramine-HYDROcodone (TUSSIONEX) 10-8 MG/5ML LQCR Take 10 mLs by mouth every 12 (twelve) hours as needed (COUGH).  50 mL  0  . citalopram (CELEXA) 40 MG tablet Take 40 mg by mouth daily.      . insulin glargine (LANTUS) 100 UNIT/ML injection Inject 40 Units into the skin at bedtime. The dose has been increased to 40 units.      . insulin lispro (HUMALOG) 100 UNIT/ML injection Check your blood sugars at least twice daily preferably 3 times daily. Sliding scale: For a blood sugar  of 121-150 give yourself 3 units; for blood sugar of 151-200 give 4 units; for blood sugar of 201-250 give 6 units; for blood sugar of 251-300 give 9 units; for blood sugar of 301-350 give 13 units; for blood glucose of 351-400 give 17 units.  10 mL    . levothyroxine (SYNTHROID, LEVOTHROID) 175 MCG tablet Take 175 mcg by mouth daily.      Marland Kitchen losartan-hydrochlorothiazide (HYZAAR) 50-12.5 MG per tablet Take 1 tablet by mouth daily.  30 tablet  6  . metFORMIN (GLUCOPHAGE) 1000 MG tablet Take 1,000 mg by mouth 2 (two) times daily with a meal.      . mometasone (NASONEX) 50 MCG/ACT nasal spray Place 2 sprays into the nose daily. Congestion  17 g  6  . oxazepam (SERAX) 10 MG capsule Take 10 mg by mouth at bedtime as needed. Anxiety      . pantoprazole (PROTONIX) 40 MG tablet Take 40 mg by mouth daily.      . pravastatin (PRAVACHOL) 20 MG tablet Take 20 mg by mouth daily.       No facility-administered encounter medications on file as of 01/03/2013.

## 2013-01-03 NOTE — Patient Instructions (Signed)
No change in medications A cpap machine will be obtained ASAP Return 4 months

## 2013-01-04 NOTE — Assessment & Plan Note (Signed)
Severe persistent asthma stable at present Plan  Cont inhaled meds  Needs cpap asap

## 2013-01-04 NOTE — Assessment & Plan Note (Signed)
Severe osa with AHI 45/hr.  Cpap not obtained as of yet but ordered 3/12 Plan Get cpap for the patient

## 2013-01-09 ENCOUNTER — Encounter: Payer: Self-pay | Admitting: Critical Care Medicine

## 2013-01-16 ENCOUNTER — Encounter: Payer: Self-pay | Admitting: Critical Care Medicine

## 2013-01-16 ENCOUNTER — Telehealth: Payer: Self-pay | Admitting: Critical Care Medicine

## 2013-01-16 DIAGNOSIS — J45909 Unspecified asthma, uncomplicated: Secondary | ICD-10-CM

## 2013-01-16 NOTE — Telephone Encounter (Signed)
ATC pt's home # -- line rang several times with no option to leave msg. ATC pt's cell # but call was disconnected. WCB

## 2013-01-16 NOTE — Telephone Encounter (Signed)
Call pt and tell her pfts from 01/03/13 were normal.    No change in medications Put PFTs in EMR please

## 2013-01-20 NOTE — Telephone Encounter (Signed)
Called, spoke with pt's significant other.  Was advised pt is at work.  He will ask her to call office back.

## 2013-01-22 NOTE — Telephone Encounter (Signed)
Called, spoke with pt.  Informed her of PFT results and recs per [name]. She verbalized understanding of both and voiced no further questions or concerns at this time  **PFT results have been placed in epic.

## 2013-01-30 ENCOUNTER — Encounter: Payer: Self-pay | Admitting: *Deleted

## 2013-02-03 ENCOUNTER — Other Ambulatory Visit: Payer: Self-pay | Admitting: Family Medicine

## 2013-02-05 ENCOUNTER — Encounter: Payer: Self-pay | Admitting: Nurse Practitioner

## 2013-02-05 ENCOUNTER — Ambulatory Visit (INDEPENDENT_AMBULATORY_CARE_PROVIDER_SITE_OTHER): Payer: BC Managed Care – PPO | Admitting: Nurse Practitioner

## 2013-02-05 VITALS — BP 132/81 | HR 80 | Ht 65.0 in | Wt 228.4 lb

## 2013-02-05 DIAGNOSIS — E785 Hyperlipidemia, unspecified: Secondary | ICD-10-CM

## 2013-02-05 DIAGNOSIS — G4733 Obstructive sleep apnea (adult) (pediatric): Secondary | ICD-10-CM

## 2013-02-05 DIAGNOSIS — I10 Essential (primary) hypertension: Secondary | ICD-10-CM

## 2013-02-05 DIAGNOSIS — E669 Obesity, unspecified: Secondary | ICD-10-CM

## 2013-02-05 DIAGNOSIS — E119 Type 2 diabetes mellitus without complications: Secondary | ICD-10-CM

## 2013-02-05 DIAGNOSIS — K219 Gastro-esophageal reflux disease without esophagitis: Secondary | ICD-10-CM

## 2013-02-05 DIAGNOSIS — R7989 Other specified abnormal findings of blood chemistry: Secondary | ICD-10-CM

## 2013-02-05 DIAGNOSIS — E039 Hypothyroidism, unspecified: Secondary | ICD-10-CM

## 2013-02-05 MED ORDER — INSULIN GLARGINE 100 UNIT/ML ~~LOC~~ SOLN: 40.0000 [IU] | Freq: Every day | SUBCUTANEOUS | Status: DC

## 2013-02-05 MED ORDER — OXAZEPAM 10 MG PO CAPS: ORAL_CAPSULE | ORAL | Status: DC

## 2013-02-05 MED ORDER — LOSARTAN POTASSIUM-HCTZ 50-12.5 MG PO TABS: 1.0000 | ORAL_TABLET | Freq: Every day | ORAL | Status: DC

## 2013-02-05 MED ORDER — METFORMIN HCL 1000 MG PO TABS: 1000.0000 mg | ORAL_TABLET | Freq: Two times a day (BID) | ORAL | Status: DC

## 2013-02-05 NOTE — Assessment & Plan Note (Signed)
Liver function test ordered. °

## 2013-02-05 NOTE — Patient Instructions (Signed)
Sleeve gastrectomy; [facility] hospital; call insurance find out what they will cover for bariatric surgery, who they will cover (provider), how much out of pocket costs

## 2013-02-05 NOTE — Assessment & Plan Note (Signed)
Continue pravastatin. Fasting lipid profile ordered.

## 2013-02-05 NOTE — Progress Notes (Signed)
Subjective:  Presents for routine followup. Has recently picked up new insurance. Needs a new glucose monitor strips and lancets. Has not been checking her sugar, has been off her sliding scale for about 5 months. Continues to take her Lantus 40 units in the evening. Has not had an eye exam in about 2 years. The foot pain or burning. Mild tingling rarely. No chest pain/ischemic type pain or shortness of breath. Minimal change in weight. Has not been using her CPAP nasal prongs due to irritation. Plans to change these to see if that will help.  Objective:   BP 132/81  Pulse 80  Ht 5\' 5"  (1.651 m)  Wt 228 lb 6.4 oz (103.602 kg)  BMI 38.01 kg/m2 NAD. Alert, oriented. Thyroid normal limit to palpation and nontender. Lungs clear. Heart regular rate rhythm. Abdomen obese soft nondistended nontender. Waist circumference greater than 35 inches. Central obesity noted.

## 2013-02-05 NOTE — Assessment & Plan Note (Signed)
Stable; continue current regimen.

## 2013-02-05 NOTE — Assessment & Plan Note (Signed)
Continue current dose of levothyroxine, TSH pending.

## 2013-02-05 NOTE — Assessment & Plan Note (Signed)
Given prescription for new glucose testing machine lancets and strips. Recommend using sliding scale 3 times a day before meals with appropriate amount of Humalog insulin. Recommend eye exam sometime this year. Recheck in 3 months. Routine lab work ordered.

## 2013-02-05 NOTE — Assessment & Plan Note (Signed)
Stable  Continue current regimen  

## 2013-02-05 NOTE — Assessment & Plan Note (Signed)
Recommend patient restart her CPAP, discussed rationale.

## 2013-02-05 NOTE — Assessment & Plan Note (Signed)
Discussed options including bariatric surgery. Patient plans to explore this option. May consider oral medication to help with weight loss depending on lab results.

## 2013-02-07 LAB — HEPATIC FUNCTION PANEL
ALT: 35 U/L (ref 0–35)
AST: 30 U/L (ref 0–37)
Albumin: 4.3 g/dL (ref 3.5–5.2)
Alkaline Phosphatase: 83 U/L (ref 39–117)
Bilirubin, Direct: 0.1 mg/dL (ref 0.0–0.3)
Total Bilirubin: 0.8 mg/dL (ref 0.3–1.2)

## 2013-02-07 LAB — LIPID PANEL
Total CHOL/HDL Ratio: 6 Ratio
VLDL: 28 mg/dL (ref 0–40)

## 2013-02-07 LAB — BASIC METABOLIC PANEL
CO2: 32 mEq/L (ref 19–32)
Chloride: 103 mEq/L (ref 96–112)
Creat: 0.64 mg/dL (ref 0.50–1.10)
Glucose, Bld: 117 mg/dL — ABNORMAL HIGH (ref 70–99)

## 2013-02-07 LAB — TSH: TSH: 4.248 u[IU]/mL (ref 0.350–4.500)

## 2013-02-07 LAB — MICROALBUMIN, URINE: Microalb, Ur: 1.2 mg/dL (ref 0.00–1.89)

## 2013-02-12 ENCOUNTER — Ambulatory Visit: Payer: BC Managed Care – PPO | Admitting: Pulmonary Disease

## 2013-02-12 NOTE — Telephone Encounter (Signed)
May refill times three 

## 2013-02-24 ENCOUNTER — Other Ambulatory Visit: Payer: Self-pay | Admitting: Nurse Practitioner

## 2013-02-24 MED ORDER — LEVOTHYROXINE SODIUM 175 MCG PO TABS: 175.0000 ug | ORAL_TABLET | Freq: Every day | ORAL | Status: DC

## 2013-02-24 MED ORDER — PRAVASTATIN SODIUM 20 MG PO TABS: 20.0000 mg | ORAL_TABLET | Freq: Every day | ORAL | Status: DC

## 2013-02-27 ENCOUNTER — Other Ambulatory Visit: Payer: Self-pay | Admitting: Nurse Practitioner

## 2013-02-27 ENCOUNTER — Telehealth: Payer: Self-pay | Admitting: *Deleted

## 2013-02-27 MED ORDER — PHENTERMINE HCL 37.5 MG PO TABS: 37.5000 mg | ORAL_TABLET | Freq: Every day | ORAL | Status: DC

## 2013-02-27 NOTE — Telephone Encounter (Signed)
Phentermine 37.5  #30 with 0 RF

## 2013-03-04 ENCOUNTER — Ambulatory Visit (INDEPENDENT_AMBULATORY_CARE_PROVIDER_SITE_OTHER): Payer: BC Managed Care – PPO | Admitting: Family Medicine

## 2013-03-04 ENCOUNTER — Encounter: Payer: Self-pay | Admitting: Family Medicine

## 2013-03-04 VITALS — BP 124/80 | Temp 98.8°F | Wt 231.6 lb

## 2013-03-04 DIAGNOSIS — J019 Acute sinusitis, unspecified: Secondary | ICD-10-CM

## 2013-03-04 MED ORDER — CEFPROZIL 500 MG PO TABS: 500.0000 mg | ORAL_TABLET | Freq: Two times a day (BID) | ORAL | Status: DC

## 2013-03-04 MED ORDER — HYDROCODONE-HOMATROPINE 5-1.5 MG/5ML PO SYRP: 5.0000 mL | ORAL_SOLUTION | Freq: Four times a day (QID) | ORAL | Status: DC | PRN

## 2013-03-04 NOTE — Progress Notes (Signed)
  Subjective:    Patient ID: Sarah Pope, female    DOB: [birth year], 56 y.o.   MRN: [id number]  Sore Throat  This is a new problem. The current episode started yesterday. The problem has been rapidly worsening. The pain is at a severity of 7/10. Associated symptoms include congestion, coughing, headaches, a plugged ear sensation, shortness of breath and trouble swallowing.      Review of Systems  HENT: Positive for congestion and trouble swallowing.   Respiratory: Positive for cough and shortness of breath.   Neurological: Positive for headaches.       Objective:   Physical Exam  Sinus mild tenderness eardrums normal throat normal patient's voice is hoarse neck no masses lungs clear heart regular      Assessment & Plan:  URI with hoarseness and acute sinusitis Cefzil twice a day 10 days call if problems.

## 2013-03-14 ENCOUNTER — Other Ambulatory Visit: Payer: Self-pay | Admitting: Family Medicine

## 2013-03-15 NOTE — Telephone Encounter (Signed)
Ok times 1 total 

## 2013-04-10 ENCOUNTER — Other Ambulatory Visit: Payer: Self-pay | Admitting: Nurse Practitioner

## 2013-04-10 MED ORDER — PHENTERMINE HCL 37.5 MG PO TABS: 37.5000 mg | ORAL_TABLET | Freq: Every day | ORAL | Status: DC

## 2013-06-03 ENCOUNTER — Other Ambulatory Visit: Payer: Self-pay | Admitting: Nurse Practitioner

## 2013-06-03 ENCOUNTER — Other Ambulatory Visit: Payer: Self-pay | Admitting: Family Medicine

## 2013-06-18 ENCOUNTER — Telehealth: Payer: Self-pay | Admitting: Family Medicine

## 2013-06-18 MED ORDER — BENZONATATE 200 MG PO CAPS: 200.0000 mg | ORAL_CAPSULE | Freq: Four times a day (QID) | ORAL | Status: DC | PRN

## 2013-06-18 MED ORDER — BENZONATATE 100 MG PO CAPS: 100.0000 mg | ORAL_CAPSULE | Freq: Four times a day (QID) | ORAL | Status: DC | PRN

## 2013-06-18 NOTE — Telephone Encounter (Signed)
Tessalon 100 thirty one q 6 prn cough one ref

## 2013-06-18 NOTE — Addendum Note (Signed)
Addended by: Dereck Ligas on: 06/18/2013 05:27 PM   Modules accepted: Orders

## 2013-06-18 NOTE — Telephone Encounter (Signed)
Med sent to pharm and pt notified.  

## 2013-06-18 NOTE — Telephone Encounter (Signed)
Pt wants to know if we can call in some cough meds for her due to her allergies causing her to cough at night while she is trying to sleep. Please call into [facility] call [phone] cell if no answer at home number

## 2013-07-24 ENCOUNTER — Telehealth: Payer: Self-pay | Admitting: Nurse Practitioner

## 2013-07-24 ENCOUNTER — Other Ambulatory Visit: Payer: Self-pay | Admitting: Nurse Practitioner

## 2013-07-24 MED ORDER — FLUCONAZOLE 150 MG PO TABS: ORAL_TABLET | ORAL | Status: DC

## 2013-07-24 NOTE — Telephone Encounter (Signed)
Pt would like to know if we can call her in 2x diflucan for her yeast infection, please call into [facility]

## 2013-08-18 ENCOUNTER — Other Ambulatory Visit: Payer: Self-pay | Admitting: Nurse Practitioner

## 2013-08-18 ENCOUNTER — Other Ambulatory Visit: Payer: Self-pay | Admitting: Family Medicine

## 2013-08-27 NOTE — Telephone Encounter (Signed)
Let's do plus 5 ref

## 2013-09-10 ENCOUNTER — Ambulatory Visit (INDEPENDENT_AMBULATORY_CARE_PROVIDER_SITE_OTHER): Payer: BC Managed Care – PPO | Admitting: Nurse Practitioner

## 2013-09-10 VITALS — BP 132/70 | Ht 65.0 in | Wt 224.6 lb

## 2013-09-10 DIAGNOSIS — E119 Type 2 diabetes mellitus without complications: Secondary | ICD-10-CM

## 2013-09-10 DIAGNOSIS — Z23 Encounter for immunization: Secondary | ICD-10-CM

## 2013-09-10 MED ORDER — PHENTERMINE HCL 37.5 MG PO TABS: 37.5000 mg | ORAL_TABLET | Freq: Every day | ORAL | Status: DC

## 2013-09-10 MED ORDER — INSULIN GLARGINE 100 UNIT/ML ~~LOC~~ SOLN: 40.0000 [IU] | Freq: Every day | SUBCUTANEOUS | Status: DC

## 2013-09-10 NOTE — Patient Instructions (Signed)
Sleeve gastrectomy [facility] in [place] on Wed Have to sign up

## 2013-09-11 ENCOUNTER — Encounter: Payer: Self-pay | Admitting: Nurse Practitioner

## 2013-09-11 NOTE — Assessment & Plan Note (Signed)
strongly encouraged patient to use Humalog per sliding scale. Go back to the diet and exercise regimen previously, her hemoglobin A1c was running very well at that time. Also patient given information per her request to look into bariatric surgery. Will do 3 more months of phentermine and then patient must stop for 6 months. Recheck in 3 months, call back sooner if any problems. Flu vaccine today.

## 2013-09-11 NOTE — Assessment & Plan Note (Signed)
strongly encouraged patient to use Humalog per sliding scale. Go back to the diet and exercise regimen previously, her hemoglobin A1c was running very well at that time. Also patient given information per her request to look into bariatric surgery. Will do 3 more months of phentermine and then patient must stop for 6 months. Recheck in 3 months, call back sooner if any problems. Flu vaccine today. 

## 2013-09-11 NOTE — Progress Notes (Signed)
Subjective:  Presents for recheck on her diabetes. Has been under increased stress lately. Taking care of her boyfriend who has had surgery. Has not done well with her diet. Has tried to do walking at least 2-3 times per week. Taking her Lantus everyday. Does not do her Humalog per sliding scale as recommended. Maybe 2-3 times per week total. Some fatigue with her sugars running higher than usual. No chest pain shortness of breath or edema. Wishes to restart her weight loss medicine for few months to see if this will help.  Objective:   BP 132/70  Ht 5\' 5"  (1.651 m)  Wt 224 lb 9.6 oz (101.878 kg)  BMI 37.38 kg/m2 NAD. Alert, oriented. Lungs clear. Heart regular rate rhythm. Hemoglobin A1c went from 6.9 a previous visit to 10.8 today.  Assessment:Diabetes - Plan: POCT glycosylated hemoglobin (Hb A1C)  Morbid obesity  Plan: Meds ordered this encounter  Medications  . phentermine (ADIPEX-P) 37.5 MG tablet    Sig: Take 1 tablet (37.5 mg total) by mouth daily before breakfast.    Dispense:  30 tablet    Refill:  2    Order Specific Question:  Supervising Provider    Answer:  Merlyn Albert [[id number]]  . insulin glargine (LANTUS) 100 UNIT/ML injection    Sig: Inject 0.4 mLs (40 Units total) into the skin at bedtime. The dose has been increased to 40 units.    Dispense:  5 pen    Refill:  5    Order Specific Question:  Supervising Provider    Answer:  Merlyn Albert [[id number]]   strongly encouraged patient to use Humalog per sliding scale. Go back to the diet and exercise regimen previously, her hemoglobin A1c was running very well at that time. Also patient given information per her request to look into bariatric surgery. Will do 3 more months of phentermine and then patient must stop for 6 months. Recheck in 3 months, call back sooner if any problems. Flu vaccine today.

## 2013-10-02 ENCOUNTER — Ambulatory Visit (INDEPENDENT_AMBULATORY_CARE_PROVIDER_SITE_OTHER): Payer: BC Managed Care – PPO | Admitting: Nurse Practitioner

## 2013-10-02 ENCOUNTER — Encounter: Payer: Self-pay | Admitting: Nurse Practitioner

## 2013-10-02 VITALS — BP 128/82 | Temp 99.0°F | Ht 65.0 in | Wt 220.4 lb

## 2013-10-02 DIAGNOSIS — J322 Chronic ethmoidal sinusitis: Secondary | ICD-10-CM

## 2013-10-02 DIAGNOSIS — J209 Acute bronchitis, unspecified: Secondary | ICD-10-CM

## 2013-10-02 MED ORDER — FLUCONAZOLE 150 MG PO TABS: ORAL_TABLET | ORAL | Status: DC

## 2013-10-02 MED ORDER — AMOXICILLIN-POT CLAVULANATE 875-125 MG PO TABS: 1.0000 | ORAL_TABLET | Freq: Two times a day (BID) | ORAL | Status: DC

## 2013-10-02 MED ORDER — HYDROCODONE-HOMATROPINE 5-1.5 MG/5ML PO SYRP: 5.0000 mL | ORAL_SOLUTION | ORAL | Status: DC | PRN

## 2013-10-02 MED ORDER — ALBUTEROL SULFATE HFA 108 (90 BASE) MCG/ACT IN AERS: 2.0000 | INHALATION_SPRAY | RESPIRATORY_TRACT | Status: DC | PRN

## 2013-10-03 ENCOUNTER — Encounter: Payer: Self-pay | Admitting: Nurse Practitioner

## 2013-10-03 NOTE — Progress Notes (Signed)
Subjective:  Presents for complaints of sore throat and sinus congestion for the past 4 days. Possible fever, sweating at nighttime. Ethmoid sinus area headache. Cough producing slight yellow sputum. No wheezing. Bilateral ear pain. No vomiting diarrhea abdominal pain. No acid reflux.  Objective:   BP 128/82  Temp(Src) 99 F (37.2 C) (Oral)  Ht 5\' 5"  (1.651 m)  Wt 220 lb 6.4 oz (99.973 kg)  BMI 36.68 kg/m2 NAD. Alert, oriented. TMs extremely retracted, no erythema. Pharynx injected with PND noted. Neck supple with mild soft nontender adenopathy. Lungs scattered coarse expiratory crackles posterior. 1 faint expiratory wheeze left upper lobe anterior. No tachypnea. Heart regular rate rhythm.  Assessment:Ethmoid sinusitis  Acute bronchitis  Plan: Meds ordered this encounter  Medications  . amoxicillin-clavulanate (AUGMENTIN) 875-125 MG per tablet    Sig: Take 1 tablet by mouth 2 (two) times daily.    Dispense:  20 tablet    Refill:  0    Order Specific Question:  Supervising Provider    Answer:  Merlyn Albert [[id number]]  . fluconazole (DIFLUCAN) 150 MG tablet    Sig: One po qd prn yeast infection; may repeat in 3-4 days if needed    Dispense:  2 tablet    Refill:  5    Order Specific Question:  Supervising Provider    Answer:  Merlyn Albert [[id number]]  . HYDROcodone-homatropine (HYCODAN) 5-1.5 MG/5ML syrup    Sig: Take 5 mLs by mouth every 4 (four) hours as needed.    Dispense:  120 mL    Refill:  0    Order Specific Question:  Supervising Provider    Answer:  Merlyn Albert [[id number]]  . albuterol (PROVENTIL HFA;VENTOLIN HFA) 108 (90 BASE) MCG/ACT inhaler    Sig: Inhale 2 puffs into the lungs every 4 (four) hours as needed for wheezing or shortness of breath.    Dispense:  1 Inhaler    Refill:  5    Order Specific Question:  Supervising Provider    Answer:  Merlyn Albert [[id number]]   OTC meds as directed for congestion. Call back in 4-5 days if no improvement, sooner if  worse.

## 2013-12-17 ENCOUNTER — Ambulatory Visit: Payer: BC Managed Care – PPO | Admitting: Nurse Practitioner

## 2013-12-20 ENCOUNTER — Other Ambulatory Visit: Payer: Self-pay | Admitting: Family Medicine

## 2014-02-02 ENCOUNTER — Other Ambulatory Visit: Payer: Self-pay | Admitting: Nurse Practitioner

## 2014-02-02 ENCOUNTER — Telehealth: Payer: Self-pay | Admitting: Nurse Practitioner

## 2014-02-02 ENCOUNTER — Other Ambulatory Visit: Payer: Self-pay | Admitting: Family Medicine

## 2014-02-02 DIAGNOSIS — Z1231 Encounter for screening mammogram for malignant neoplasm of breast: Secondary | ICD-10-CM

## 2014-02-02 NOTE — Telephone Encounter (Signed)
Error

## 2014-02-05 ENCOUNTER — Ambulatory Visit (HOSPITAL_COMMUNITY)
Admission: RE | Admit: 2014-02-05 | Discharge: 2014-02-05 | Disposition: A | Discharge status: Home / Self Care | Payer: BC Managed Care – PPO | Source: Ambulatory Visit | Attending: Nurse Practitioner | Admitting: Nurse Practitioner

## 2014-02-05 DIAGNOSIS — Z1231 Encounter for screening mammogram for malignant neoplasm of breast: Secondary | ICD-10-CM | POA: Insufficient documentation

## 2014-03-30 ENCOUNTER — Encounter: Payer: Self-pay | Admitting: Nurse Practitioner

## 2014-03-30 ENCOUNTER — Telehealth: Payer: Self-pay | Admitting: Nurse Practitioner

## 2014-03-30 NOTE — Telephone Encounter (Signed)
Pt is requesting a letter to release her form jury duty due to her diabetes an inability  To sit for long periods of time.  Please call pt when ready  Needs it before the 17th of June   [phone] if you can't reach her at home

## 2014-03-30 NOTE — Telephone Encounter (Signed)
Letter sent to [facility] to forward to patient.

## 2014-04-09 ENCOUNTER — Other Ambulatory Visit: Payer: Self-pay | Admitting: Family Medicine

## 2014-04-16 ENCOUNTER — Encounter: Payer: Self-pay | Admitting: Nurse Practitioner

## 2014-04-16 ENCOUNTER — Ambulatory Visit (INDEPENDENT_AMBULATORY_CARE_PROVIDER_SITE_OTHER): Payer: BC Managed Care – PPO | Admitting: Nurse Practitioner

## 2014-04-16 VITALS — BP 130/80 | Ht 65.5 in | Wt 199.2 lb

## 2014-04-16 DIAGNOSIS — I10 Essential (primary) hypertension: Secondary | ICD-10-CM

## 2014-04-16 DIAGNOSIS — Z0000 Encounter for general adult medical examination without abnormal findings: Secondary | ICD-10-CM

## 2014-04-16 DIAGNOSIS — E119 Type 2 diabetes mellitus without complications: Secondary | ICD-10-CM

## 2014-04-16 DIAGNOSIS — E039 Hypothyroidism, unspecified: Secondary | ICD-10-CM

## 2014-04-16 DIAGNOSIS — E785 Hyperlipidemia, unspecified: Secondary | ICD-10-CM

## 2014-04-16 DIAGNOSIS — Z124 Encounter for screening for malignant neoplasm of cervix: Secondary | ICD-10-CM

## 2014-04-16 DIAGNOSIS — Z79899 Other long term (current) drug therapy: Secondary | ICD-10-CM

## 2014-04-16 DIAGNOSIS — Z01419 Encounter for gynecological examination (general) (routine) without abnormal findings: Secondary | ICD-10-CM

## 2014-04-16 MED ORDER — PHENTERMINE HCL 37.5 MG PO TABS: 37.5000 mg | ORAL_TABLET | Freq: Every day | ORAL | Status: DC

## 2014-04-16 MED ORDER — LEVOTHYROXINE SODIUM 175 MCG PO TABS: ORAL_TABLET | ORAL | Status: DC

## 2014-04-16 MED ORDER — BUDESONIDE 180 MCG/ACT IN AEPB
2.0000 | INHALATION_SPRAY | Freq: Two times a day (BID) | RESPIRATORY_TRACT | Status: DC
Start: 2014-04-16 — End: 2015-10-19

## 2014-04-16 MED ORDER — ALBUTEROL SULFATE HFA 108 (90 BASE) MCG/ACT IN AERS: 2.0000 | INHALATION_SPRAY | RESPIRATORY_TRACT | Status: DC | PRN

## 2014-04-16 MED ORDER — METFORMIN HCL 1000 MG PO TABS: ORAL_TABLET | ORAL | Status: DC

## 2014-04-16 MED ORDER — OXAZEPAM 10 MG PO CAPS: ORAL_CAPSULE | ORAL | Status: DC

## 2014-04-16 MED ORDER — FLUCONAZOLE 150 MG PO TABS: ORAL_TABLET | ORAL | Status: DC

## 2014-04-16 MED ORDER — CITALOPRAM HYDROBROMIDE 40 MG PO TABS: ORAL_TABLET | ORAL | Status: DC

## 2014-04-16 MED ORDER — LOSARTAN POTASSIUM-HCTZ 50-12.5 MG PO TABS: ORAL_TABLET | ORAL | Status: DC

## 2014-04-17 ENCOUNTER — Encounter: Payer: Self-pay | Admitting: Nurse Practitioner

## 2014-04-17 NOTE — Progress Notes (Signed)
Subjective:    Patient ID: Sarah Pope, female    DOB: Feb 21, 1956, 58 y.o.   MRN: [id number]  HPI presents for her wellness physical. Has done well with weight loss. Has lost 21 pounds since last year. Regular eye exams. Regular dental exams. Generalized pain in the lower legs especially when she is been on her feet at work. No numbness or pain of the feet. Regular exercise. Doing well per diet except for sodium intake. 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    Review of Systems  Constitutional: Negative for activity change, appetite change and fatigue.  HENT: Positive for rhinorrhea. Negative for congestion, dental problem, ear pain, sinus pressure and sore throat.   Respiratory: Negative for cough, chest tightness, shortness of breath and wheezing.   Cardiovascular: Positive for leg swelling. Negative for chest pain.       Mild edema lower extremities only on days that she works. Does have significant sodium intake.  Gastrointestinal: Negative for nausea, vomiting, abdominal pain, diarrhea, constipation, blood in stool and abdominal distention.  Genitourinary: Negative for dysuria, urgency, frequency, vaginal bleeding, vaginal discharge, enuresis, difficulty urinating, genital sores and pelvic pain.       Objective:   Physical Exam  Vitals reviewed. Constitutional: She is oriented to person, place, and time. She appears well-developed. No distress.  HENT:  Right Ear: External ear normal.  Left Ear: External ear normal.  Mouth/Throat: Oropharynx is clear and moist.  Neck: Normal range of motion. Neck supple. No tracheal deviation present. No thyromegaly present.  Cardiovascular: Normal rate, regular rhythm and normal heart sounds.  Exam reveals no gallop.   No murmur heard. Pulmonary/Chest: Effort normal and breath sounds normal.  Abdominal: Soft. She  exhibits no distension. There is no tenderness.  Genitourinary: Vagina normal and uterus normal. No vaginal discharge found.  External GU: minimal irritation; no lesions; vagina: no discharge; cervix: normal, no CMT. Bimanual exam: no tenderness; ovaries nonpalpable but exam limited due to abd girth; rectal exam: no masses, no stool for hemoccult  Musculoskeletal: She exhibits no edema.  Lymphadenopathy:    She has no cervical adenopathy.  Neurological: She is alert and oriented to person, place, and time.  See diabetic foot exam notes  Skin: Skin is warm and dry. No rash noted.  Significant superficial varicose veins noted lower extremities. Moderate sun damage noted, multiple nevi noted.  Psychiatric: She has a normal mood and affect. Her behavior is normal.  Breast exam: no masses; axillae no adenopathy        Assessment & Plan:   Problem List Items Addressed This Visit     Cardiovascular and Mediastinum   HTN (hypertension) (Chronic)   Relevant Medications      losartan-hydrochlorothiazide (HYZAAR) 50-12.5 MG per tablet     Endocrine   DM type 2 (diabetes mellitus, type 2) (Chronic)   Relevant Medications      metFORMIN (GLUCOPHAGE) tablet      losartan-hydrochlorothiazide (HYZAAR) 50-12.5 MG per tablet   Other Relevant Orders      Microalbumin, urine      Hemoglobin A1c   Hypothyroidism (Chronic)   Relevant Medications      levothyroxine (SYNTHROID, LEVOTHROID) tablet   Other Relevant Orders      TSH     Other   Hyperlipidemia LDL goal <100   Relevant Medications  losartan-hydrochlorothiazide (HYZAAR) 50-12.5 MG per tablet   Other Relevant Orders      Lipid panel    Other Visit Diagnoses   Well woman exam    -  Primary    Relevant Orders       Pap IG w/ reflex to HPV when ASC-U    Screening for cervical cancer        Relevant Orders       Pap IG w/ reflex to HPV when ASC-U    Encounter for long-term (current) use of other medications        Relevant  Orders       Hepatic function panel       Basic metabolic panel      Decrease sodium intake in her diet. Encouraged continued weight loss and exercise. Daily multivitamin with calcium and vitamin D. Patient defers referral for varicose veins at this time. Recommend recheck with her dermatologist for skin cancer screening. Followup in 3-4 months, call back sooner if any problems.

## 2014-04-20 LAB — PAP IG W/ RFLX HPV ASCU

## 2014-06-04 ENCOUNTER — Telehealth: Payer: Self-pay | Admitting: Nurse Practitioner

## 2014-06-04 ENCOUNTER — Other Ambulatory Visit: Payer: Self-pay | Admitting: Nurse Practitioner

## 2014-06-04 MED ORDER — LOSARTAN POTASSIUM-HCTZ 50-12.5 MG PO TABS: ORAL_TABLET | ORAL | Status: DC

## 2014-06-04 MED ORDER — METFORMIN HCL 1000 MG PO TABS: ORAL_TABLET | ORAL | Status: DC

## 2014-06-04 MED ORDER — CITALOPRAM HYDROBROMIDE 40 MG PO TABS: ORAL_TABLET | ORAL | Status: DC

## 2014-06-04 NOTE — Telephone Encounter (Signed)
Pt needs refills sent to mail order instead of reg pharmacy, please Do in a 90 day supply.  losartan-hydrochlorothiazide (HYZAAR) 50-12.5 MG per tablet  metFORMIN (GLUCOPHAGE) 1000 MG tablet  citalopram (CELEXA) 40 MG tablet  See chart for copy of mail order to fax scripts too

## 2014-06-04 NOTE — Progress Notes (Signed)
Left message notifying pt meds requested sent to pharm and to call us if she needs any additonal meds sent to pharm.

## 2014-07-03 ENCOUNTER — Other Ambulatory Visit: Payer: Self-pay | Admitting: Nurse Practitioner

## 2014-07-06 NOTE — Telephone Encounter (Signed)
1 refill then will need office visit

## 2014-07-20 ENCOUNTER — Other Ambulatory Visit: Payer: Self-pay | Admitting: *Deleted

## 2014-07-20 MED ORDER — LEVOTHYROXINE SODIUM 175 MCG PO TABS: ORAL_TABLET | ORAL | Status: DC

## 2014-09-30 ENCOUNTER — Encounter: Payer: Self-pay | Admitting: Nurse Practitioner

## 2014-09-30 ENCOUNTER — Ambulatory Visit (INDEPENDENT_AMBULATORY_CARE_PROVIDER_SITE_OTHER): Payer: BC Managed Care – PPO | Admitting: Nurse Practitioner

## 2014-09-30 ENCOUNTER — Encounter (INDEPENDENT_AMBULATORY_CARE_PROVIDER_SITE_OTHER): Payer: Self-pay

## 2014-09-30 VITALS — BP 122/84 | Temp 98.4°F | Ht 65.5 in | Wt 211.0 lb

## 2014-09-30 DIAGNOSIS — B349 Viral infection, unspecified: Secondary | ICD-10-CM

## 2014-09-30 NOTE — Patient Instructions (Signed)
Preferred inhaled steroid for asthma

## 2014-10-02 ENCOUNTER — Other Ambulatory Visit: Payer: Self-pay | Admitting: Nurse Practitioner

## 2014-10-02 ENCOUNTER — Telehealth: Payer: Self-pay | Admitting: Nurse Practitioner

## 2014-10-02 MED ORDER — BECLOMETHASONE DIPROPIONATE 80 MCG/ACT IN AERS: 1.0000 | INHALATION_SPRAY | Freq: Two times a day (BID) | RESPIRATORY_TRACT | Status: DC

## 2014-10-02 NOTE — Telephone Encounter (Signed)
Please call Sarah Pope and see if they can tell us which inhaled steroid is preferred. Patient already has an albuterol inhaler; does she want a different brand?

## 2014-10-02 NOTE — Telephone Encounter (Signed)
Pt.notified

## 2014-10-02 NOTE — Telephone Encounter (Signed)
Patient was to call back today and let Hoyle Sauer know which inhaler she wanted.  She would like Rx for albuterol (PROVENTIL HFA;VENTOLIN HFA) 108 (90 BASE) MCG/ACT inhaler.  She tried to find out the formulary with her insurance to see which would be covered, but she got nowhere. She would like this inhaler called in because her daughter had this and she used it and she liked it.  [facility]

## 2014-10-02 NOTE — Telephone Encounter (Signed)
The do not know what is on formulary for her primary insurance  - medicaid is her secondary and they cover Qvar and Pulmicort

## 2014-10-02 NOTE — Telephone Encounter (Signed)
I will send in QVAR Rx to see if it is covered. This is her preventive med. Remind her that albuterol is her rescue inhaler to be used with acute wheezing.

## 2014-10-03 ENCOUNTER — Encounter: Payer: Self-pay | Admitting: Nurse Practitioner

## 2014-10-03 NOTE — Progress Notes (Signed)
Subjective:  Presents for c/o cough, chills, myalgias that began yesterday. Malaise. Ear pain x 2 d. Headache. Mild abd pain. No vomiting or diarrhea. No urinary symptoms.  Objective:   BP 122/84 mmHg  Temp(Src) 98.4 F (36.9 C) (Oral)  Ht 5' 5.5" (1.664 m)  Wt 211 lb (95.709 kg)  BMI 34.57 kg/m2 NAD. Alert, oriented. TMs clear effusion. Pharynx clear. Neck supple with mild adenopathy. Lungs clear. Heart RRR. Abdomen soft, nontender.   Assessment: Viral illness  Plan: reviewed symptomatic care and warning signs. Call back in 48 hours if no better, sooner if worse.

## 2015-02-05 ENCOUNTER — Other Ambulatory Visit: Payer: Self-pay | Admitting: Nurse Practitioner

## 2015-04-27 ENCOUNTER — Other Ambulatory Visit: Payer: Self-pay | Admitting: Nurse Practitioner

## 2015-04-27 NOTE — Telephone Encounter (Signed)
Dr. Steves patient 

## 2015-04-28 NOTE — Telephone Encounter (Signed)
Ok plus two mo ref, yrly wellness due

## 2015-06-02 ENCOUNTER — Other Ambulatory Visit: Payer: Self-pay | Admitting: Physician Assistant

## 2015-06-16 ENCOUNTER — Other Ambulatory Visit: Payer: Self-pay | Admitting: Physician Assistant

## 2015-09-27 ENCOUNTER — Other Ambulatory Visit: Payer: Self-pay | Admitting: Nurse Practitioner

## 2015-09-29 ENCOUNTER — Encounter: Payer: Self-pay | Admitting: Nurse Practitioner

## 2015-09-29 ENCOUNTER — Ambulatory Visit (INDEPENDENT_AMBULATORY_CARE_PROVIDER_SITE_OTHER): Payer: BLUE CROSS/BLUE SHIELD | Admitting: Nurse Practitioner

## 2015-09-29 VITALS — BP 122/82 | Ht 65.5 in | Wt 195.6 lb

## 2015-09-29 DIAGNOSIS — R7989 Other specified abnormal findings of blood chemistry: Secondary | ICD-10-CM | POA: Diagnosis not present

## 2015-09-29 DIAGNOSIS — E119 Type 2 diabetes mellitus without complications: Secondary | ICD-10-CM

## 2015-09-29 DIAGNOSIS — R5383 Other fatigue: Secondary | ICD-10-CM

## 2015-09-29 DIAGNOSIS — Z794 Long term (current) use of insulin: Secondary | ICD-10-CM | POA: Diagnosis not present

## 2015-09-29 DIAGNOSIS — E785 Hyperlipidemia, unspecified: Secondary | ICD-10-CM

## 2015-09-29 DIAGNOSIS — F329 Major depressive disorder, single episode, unspecified: Secondary | ICD-10-CM

## 2015-09-29 DIAGNOSIS — Z23 Encounter for immunization: Secondary | ICD-10-CM

## 2015-09-29 DIAGNOSIS — R945 Abnormal results of liver function studies: Secondary | ICD-10-CM

## 2015-09-29 DIAGNOSIS — F32A Depression, unspecified: Secondary | ICD-10-CM | POA: Insufficient documentation

## 2015-09-29 DIAGNOSIS — I10 Essential (primary) hypertension: Secondary | ICD-10-CM | POA: Diagnosis not present

## 2015-09-29 NOTE — Patient Instructions (Signed)
Ask them what is preferred: Long acting insulin (Lantus) Short acting (Humalog or Novolog) Only want pens

## 2015-09-30 ENCOUNTER — Other Ambulatory Visit: Payer: Self-pay

## 2015-09-30 ENCOUNTER — Encounter: Payer: Self-pay | Admitting: Nurse Practitioner

## 2015-09-30 MED ORDER — OXAZEPAM 10 MG PO CAPS: ORAL_CAPSULE | ORAL | Status: DC

## 2015-09-30 MED ORDER — LEVOTHYROXINE SODIUM 175 MCG PO TABS: ORAL_TABLET | ORAL | Status: DC

## 2015-09-30 NOTE — Telephone Encounter (Signed)
Six mo worth 

## 2015-09-30 NOTE — Progress Notes (Signed)
Subjective:  Presents for routine follow-up. 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 Has been under tremendous stress lately. Also some financial issues. Has had some weight loss. Limited activity. No chest pain/ischemic type pain or shortness of breath. Sugars have been running a little higher than usual. Needs refills on her insulin but is unsure which ones are preferred. Did not get lab work ordered in June.  Objective:   BP 122/82 mmHg  Ht 5' 5.5" (1.664 m)  Wt 195 lb 9.6 oz (88.724 kg)  BMI 32.04 kg/m2 NAD. Alert, oriented. Lungs clear. Heart regular rate rhythm. Lower extremities no edema. Diabetic Foot Exam - Simple   Simple Foot Form  Diabetic Foot exam was performed with the following findings:  Yes 09/29/2015  4:30 PM  Visual Inspection  No deformities, no ulcerations, no other skin breakdown bilaterally:  Yes  Sensation Testing  Intact to touch and monofilament testing bilaterally:  Yes  Pulse Check  See comments:  Yes  Comments  DP pulses present bilat; mild dependent cyanosis in toes; toes cool with normal cap refill; skin very dry but no open areas        Assessment:  Problem List Items Addressed This Visit      Cardiovascular and Mediastinum   HTN (hypertension) - Primary (Chronic)     Endocrine   DM type 2 (diabetes mellitus, type 2) (HCC) (Chronic)   Relevant Orders   Basic metabolic panel   Microalbumin / creatinine urine ratio   Hemoglobin A1c     Other   Depression   Elevated LFTs (Chronic)   Relevant Orders   Hepatic function panel   Hyperlipidemia LDL goal <100   Relevant Orders   Lipid panel    Other Visit Diagnoses    Other fatigue        Relevant Orders    TSH    VITAMIN D 25 Hydroxy (Vit-D Deficiency, Fractures)    Encounter for immunization          Plan: Encourage patient to get her lab work done in the near future. Discussed importance of healthy diet regular activity and continued weight loss. Also patient to call back and let us know  which insulins are preferred under her insurance.  Strongly encourage preventive health physical. Return in about 3 months (around 12/28/2015) for recheck.

## 2015-10-07 LAB — BASIC METABOLIC PANEL
BUN/Creatinine Ratio: 13 (ref 9–23)
BUN: 9 mg/dL (ref 6–24)
CHLORIDE: 93 mmol/L — AB (ref 96–106)
CO2: 24 mmol/L (ref 18–29)
Calcium: 9.6 mg/dL (ref 8.7–10.2)
Creatinine, Ser: 0.69 mg/dL (ref 0.57–1.00)
GFR calc Af Amer: 111 mL/min/{1.73_m2} (ref >59)
GFR, EST NON AFRICAN AMERICAN: 96 mL/min/{1.73_m2} (ref >59)
GLUCOSE: 371 mg/dL — AB (ref 65–99)
POTASSIUM: 4.5 mmol/L (ref 3.5–5.2)
SODIUM: 134 mmol/L (ref 134–144)

## 2015-10-07 LAB — HEPATIC FUNCTION PANEL
ALK PHOS: 119 IU/L — AB (ref 39–117)
ALT: 33 IU/L — AB (ref 0–32)
AST: 24 IU/L (ref 0–40)
Albumin: 4.5 g/dL (ref 3.5–5.5)
BILIRUBIN TOTAL: 1 mg/dL (ref 0.0–1.2)
BILIRUBIN, DIRECT: 0.22 mg/dL (ref 0.00–0.40)
Total Protein: 7.2 g/dL (ref 6.0–8.5)

## 2015-10-07 LAB — MICROALBUMIN / CREATININE URINE RATIO
CREATININE, UR: 43.5 mg/dL
MICROALB/CREAT RATIO: 52.6 mg/g creat — ABNORMAL HIGH (ref 0.0–30.0)
Microalbumin, Urine: 22.9 ug/mL

## 2015-10-07 LAB — LIPID PANEL
CHOLESTEROL TOTAL: 235 mg/dL — AB (ref 100–199)
Chol/HDL Ratio: 5.1 ratio units — ABNORMAL HIGH (ref 0.0–4.4)
HDL: 46 mg/dL (ref >39)
LDL Calculated: 156 mg/dL — ABNORMAL HIGH (ref 0–99)
TRIGLYCERIDES: 164 mg/dL — AB (ref 0–149)
VLDL Cholesterol Cal: 33 mg/dL (ref 5–40)

## 2015-10-07 LAB — HEMOGLOBIN A1C
ESTIMATED AVERAGE GLUCOSE: 390 mg/dL
Hgb A1c MFr Bld: 15.2 % — ABNORMAL HIGH (ref 4.8–5.6)

## 2015-10-07 LAB — VITAMIN D 25 HYDROXY (VIT D DEFICIENCY, FRACTURES): Vit D, 25-Hydroxy: 14.9 ng/mL — ABNORMAL LOW (ref 30.0–100.0)

## 2015-10-07 LAB — TSH: TSH: 3.62 u[IU]/mL (ref 0.450–4.500)

## 2015-10-19 ENCOUNTER — Other Ambulatory Visit: Payer: Self-pay | Admitting: *Deleted

## 2015-10-19 ENCOUNTER — Other Ambulatory Visit: Payer: Self-pay | Admitting: Nurse Practitioner

## 2015-10-19 ENCOUNTER — Telehealth: Payer: Self-pay | Admitting: Family Medicine

## 2015-10-19 MED ORDER — BECLOMETHASONE DIPROPIONATE 80 MCG/ACT IN AERS: 1.0000 | INHALATION_SPRAY | Freq: Two times a day (BID) | RESPIRATORY_TRACT | Status: DC

## 2015-10-19 MED ORDER — INSULIN LISPRO 100 UNIT/ML ~~LOC~~ SOLN: SUBCUTANEOUS | Status: DC

## 2015-10-19 MED ORDER — BUDESONIDE 180 MCG/ACT IN AEPB: 2.0000 | INHALATION_SPRAY | Freq: Two times a day (BID) | RESPIRATORY_TRACT | Status: DC

## 2015-10-19 MED ORDER — INSULIN GLARGINE 100 UNIT/ML ~~LOC~~ SOLN: 40.0000 [IU] | Freq: Every day | SUBCUTANEOUS | Status: DC

## 2015-10-19 MED ORDER — VITAMIN D (ERGOCALCIFEROL) 1.25 MG (50000 UNIT) PO CAPS: 50000.0000 [IU] | ORAL_CAPSULE | ORAL | Status: DC

## 2015-10-19 NOTE — Telephone Encounter (Signed)
Humalog was last prescribed by Rexene Alberts. Ok to refill?

## 2015-10-19 NOTE — Telephone Encounter (Signed)
Pt is requesting refills on her  albuterol (PROVENTIL) (2.5 MG/3ML) 0.083% nebulizer solution     , beclomethasone (QVAR) 80 MCG/ACT inhaler, budesonide (PULMICORT FLEXHALER) 180 MCG/ACT inhaler, insulin glargine (LANTUS) 100 UNIT/ML injection, insulin lispro (HUMALOG) 100 UNIT/ML injection   [id number] [facility]

## 2015-10-19 NOTE — Telephone Encounter (Signed)
ok yes , six mo

## 2015-10-20 NOTE — Telephone Encounter (Signed)
meds sent to pharm. Pt notified.  

## 2015-10-22 ENCOUNTER — Other Ambulatory Visit: Payer: Self-pay | Admitting: Nurse Practitioner

## 2015-10-22 MED ORDER — PRAVASTATIN SODIUM 20 MG PO TABS: 20.0000 mg | ORAL_TABLET | Freq: Every day | ORAL | Status: DC

## 2016-01-17 ENCOUNTER — Ambulatory Visit (INDEPENDENT_AMBULATORY_CARE_PROVIDER_SITE_OTHER): Payer: BLUE CROSS/BLUE SHIELD | Admitting: Family Medicine

## 2016-01-17 ENCOUNTER — Encounter: Payer: Self-pay | Admitting: Family Medicine

## 2016-01-17 VITALS — BP 110/76 | Temp 99.2°F | Ht 65.5 in | Wt 206.0 lb

## 2016-01-17 DIAGNOSIS — R21 Rash and other nonspecific skin eruption: Secondary | ICD-10-CM | POA: Diagnosis not present

## 2016-01-17 MED ORDER — PREDNISONE 20 MG PO TABS: ORAL_TABLET | ORAL | Status: DC

## 2016-01-17 MED ORDER — TRIAMCINOLONE ACETONIDE 0.1 % EX CREA: TOPICAL_CREAM | CUTANEOUS | Status: DC

## 2016-01-17 NOTE — Progress Notes (Signed)
   Subjective:    Patient ID: Sarah Pope, female    DOB: 07/08/1956, 60 y.o.   MRN: [id number]  Rash This is a new problem. The current episode started in the past 7 days. The affected locations include the face, neck, left arm, right arm and right ear. The rash is characterized by itchiness. It is unknown if there was an exposure to a precipitant. Past treatments include antihistamine (Benadryl).   Patient states no other concerns this visit. Pruritic in nature no known obvious exposures no new medications  Did get outdoors quite a bit last week and so possible exposure to irritant and are allergenic substance possible  Review of Systems  Skin: Positive for rash.       Objective:   Physical Exam  Alert vital stable lungs clear. Heart rare rhythm HEENT normal face arms upper torso maculopapular clearly pruritic rash      Assessment & Plan:  Impression probable contact dermatitis true etiology unclear plan prednisone taper. Triamcinolone twice a day. Symptom care discussed WSL

## 2016-03-03 ENCOUNTER — Other Ambulatory Visit: Payer: Self-pay | Admitting: Family Medicine

## 2016-03-03 NOTE — Telephone Encounter (Signed)
One mo ok diab visit with caroly overdue

## 2016-04-18 ENCOUNTER — Other Ambulatory Visit: Payer: Self-pay | Admitting: Nurse Practitioner

## 2016-04-18 NOTE — Telephone Encounter (Signed)
30 d only chronic ov with me or carolyn

## 2016-04-18 NOTE — Telephone Encounter (Signed)
May we refill Celexa. 

## 2016-06-22 ENCOUNTER — Telehealth: Payer: Self-pay | Admitting: Family Medicine

## 2016-06-22 NOTE — Telephone Encounter (Signed)
ERROR

## 2016-06-27 ENCOUNTER — Ambulatory Visit (INDEPENDENT_AMBULATORY_CARE_PROVIDER_SITE_OTHER): Payer: BLUE CROSS/BLUE SHIELD | Admitting: Nurse Practitioner

## 2016-06-27 ENCOUNTER — Encounter: Payer: Self-pay | Admitting: Nurse Practitioner

## 2016-06-27 VITALS — BP 122/70 | Ht 65.5 in | Wt 195.0 lb

## 2016-06-27 DIAGNOSIS — Z794 Long term (current) use of insulin: Secondary | ICD-10-CM | POA: Diagnosis not present

## 2016-06-27 DIAGNOSIS — E559 Vitamin D deficiency, unspecified: Secondary | ICD-10-CM

## 2016-06-27 DIAGNOSIS — I10 Essential (primary) hypertension: Secondary | ICD-10-CM | POA: Diagnosis not present

## 2016-06-27 DIAGNOSIS — F32A Depression, unspecified: Secondary | ICD-10-CM

## 2016-06-27 DIAGNOSIS — E039 Hypothyroidism, unspecified: Secondary | ICD-10-CM

## 2016-06-27 DIAGNOSIS — Z79899 Other long term (current) drug therapy: Secondary | ICD-10-CM

## 2016-06-27 DIAGNOSIS — Z23 Encounter for immunization: Secondary | ICD-10-CM | POA: Diagnosis not present

## 2016-06-27 DIAGNOSIS — I8393 Asymptomatic varicose veins of bilateral lower extremities: Secondary | ICD-10-CM | POA: Diagnosis not present

## 2016-06-27 DIAGNOSIS — E119 Type 2 diabetes mellitus without complications: Secondary | ICD-10-CM

## 2016-06-27 DIAGNOSIS — F329 Major depressive disorder, single episode, unspecified: Secondary | ICD-10-CM

## 2016-06-27 LAB — POCT GLYCOSYLATED HEMOGLOBIN (HGB A1C): Hemoglobin A1C: 12.9

## 2016-06-27 MED ORDER — OXAZEPAM 10 MG PO CAPS: ORAL_CAPSULE | ORAL | 5 refills | Status: DC

## 2016-06-27 MED ORDER — CITALOPRAM HYDROBROMIDE 40 MG PO TABS: ORAL_TABLET | ORAL | 1 refills | Status: DC

## 2016-06-27 MED ORDER — CITALOPRAM HYDROBROMIDE 40 MG PO TABS: ORAL_TABLET | ORAL | 0 refills | Status: DC

## 2016-06-27 MED ORDER — METFORMIN HCL 1000 MG PO TABS: ORAL_TABLET | ORAL | 1 refills | Status: DC

## 2016-06-27 MED ORDER — LEVOTHYROXINE SODIUM 175 MCG PO TABS: ORAL_TABLET | ORAL | 1 refills | Status: DC

## 2016-06-28 ENCOUNTER — Encounter: Payer: Self-pay | Admitting: Nurse Practitioner

## 2016-06-28 MED ORDER — INSULIN GLARGINE 100 UNIT/ML ~~LOC~~ SOLN: 60.0000 [IU] | Freq: Every day | SUBCUTANEOUS | 1 refills | Status: DC

## 2016-06-28 NOTE — Progress Notes (Signed)
Subjective:  Presents for routine follow up. Taking 50 units of Lantus at bedtime. Not using her sliding scale on a regular basis. Mainly at night time. Usually runs less than 200. Only add Humalog 2-3 times per week. Does not eat much during the day. Had an eye exam less than a year ago. Has some stinging and pain in lower legs especially after working all day. No CP/ischemic type pain or SOB.   Objective:   BP 122/70   Ht 5' 5.5" (1.664 m)   Wt 195 lb (88.5 kg)   BMI 31.96 kg/m  NAD. Alert, oriented. Lungs clear. Heart RRR.  Results for orders placed or performed in visit on 06/27/16  POCT HgB A1C  Result Value Ref Range   Hemoglobin A1C 12.9      Assessment:  Problem List Items Addressed This Visit      Cardiovascular and Mediastinum   HTN (hypertension) (Chronic)     Endocrine   DM type 2 (diabetes mellitus, type 2) (HCC) - Primary (Chronic)   Relevant Medications   metFORMIN (GLUCOPHAGE) 1000 MG tablet   insulin glargine (LANTUS) 100 UNIT/ML injection   Other Relevant Orders   Hemoglobin A1C   Microalbumin / creatinine urine ratio   POCT HgB A1C (Completed)   Hypothyroidism (Chronic)   Relevant Medications   levothyroxine (SYNTHROID, LEVOTHROID) 175 MCG tablet   Other Relevant Orders   TSH     Other   Depression   Relevant Medications   citalopram (CELEXA) 40 MG tablet   oxazepam (SERAX) 10 MG capsule    Other Visit Diagnoses    Vitamin D deficiency       Relevant Orders   VITAMIN D 25 Hydroxy (Vit-D Deficiency, Fractures)   High risk medication use       Relevant Orders   Magnesium   Hepatic function panel   Lipid panel   Varicose veins of both lower extremities       Need for immunization against influenza       Relevant Orders   Flu Vaccine QUAD 36+ mos IM (Fluarix & Fluzone Quad PF (Completed)       Plan:  Meds ordered this encounter  Medications  . DISCONTD: citalopram (CELEXA) 40 MG tablet    Sig: TAKE 1 BY MOUTH DAILY    Dispense:  90  tablet    Refill:  1    Needs office visit    Order Specific Question:   Supervising Provider    Answer:   Mikey Kirschner [[id number]]  . levothyroxine (SYNTHROID, LEVOTHROID) 175 MCG tablet    Sig: TAKE ONE TABLET BY MOUTH DAILY    Dispense:  90 tablet    Refill:  1    Order Specific Question:   Supervising Provider    Answer:   Mikey Kirschner [[id number]]  . metFORMIN (GLUCOPHAGE) 1000 MG tablet    Sig: TAKE 1 BY MOUTH TWICE DAILY WITH A MEAL    Dispense:  180 tablet    Refill:  1    Needs office visit for additional refills    Order Specific Question:   Supervising Provider    Answer:   Mikey Kirschner [[id number]]  . citalopram (CELEXA) 40 MG tablet    Sig: TAKE 1 BY MOUTH DAILY    Dispense:  30 tablet    Refill:  0    Order Specific Question:   Supervising Provider    Answer:   Mikey Kirschner [[id number]]  . oxazepam (  SERAX) 10 MG capsule    Sig: TAKE ONE CAPSULE BY MOUTH TWICE DAILY AS NEEDED.    Dispense:  60 capsule    Refill:  5    This prescription was filled today(09/27/2015). Any refills authorized will be placed on file. [facility]    Order Specific Question:   Supervising Provider    Answer:   Mikey Kirschner [[id number]]  . insulin glargine (LANTUS) 100 UNIT/ML injection    Sig: Inject 0.6 mLs (60 Units total) into the skin at bedtime. The dose has been increased to 40 units.    Dispense:  5 mL    Refill:  1    Please give a 90 day supply with one additional refill.    Order Specific Question:   Supervising Provider    Answer:   Mikey Kirschner [[id number]]   Although A1C has improved, patient is not close to goal. Increase Lantus to 60 units at bedtime. Given another copy of sliding scale; use at least twice a day; try to use AC and HS. Discussed risks associated with uncontrolled diabetes.  Return in about 3 months (around 09/26/2016) for physical. Patient to get labs week before next visit.

## 2016-07-07 ENCOUNTER — Other Ambulatory Visit: Payer: Self-pay | Admitting: Family Medicine

## 2016-09-08 ENCOUNTER — Other Ambulatory Visit: Payer: Self-pay | Admitting: Nurse Practitioner

## 2016-09-10 NOTE — Telephone Encounter (Signed)
Not addressed last couple visits rec holding off on this until f u in dec

## 2016-09-27 ENCOUNTER — Encounter: Payer: BLUE CROSS/BLUE SHIELD | Admitting: Nurse Practitioner

## 2016-10-03 DIAGNOSIS — Z79899 Other long term (current) drug therapy: Secondary | ICD-10-CM | POA: Diagnosis not present

## 2016-10-03 DIAGNOSIS — Z794 Long term (current) use of insulin: Secondary | ICD-10-CM | POA: Diagnosis not present

## 2016-10-03 DIAGNOSIS — E559 Vitamin D deficiency, unspecified: Secondary | ICD-10-CM | POA: Diagnosis not present

## 2016-10-03 DIAGNOSIS — E039 Hypothyroidism, unspecified: Secondary | ICD-10-CM | POA: Diagnosis not present

## 2016-10-03 DIAGNOSIS — E119 Type 2 diabetes mellitus without complications: Secondary | ICD-10-CM | POA: Diagnosis not present

## 2016-10-04 ENCOUNTER — Ambulatory Visit (INDEPENDENT_AMBULATORY_CARE_PROVIDER_SITE_OTHER): Payer: BLUE CROSS/BLUE SHIELD | Admitting: Nurse Practitioner

## 2016-10-04 ENCOUNTER — Encounter: Payer: Self-pay | Admitting: Nurse Practitioner

## 2016-10-04 VITALS — BP 124/78 | Ht 65.5 in | Wt 205.4 lb

## 2016-10-04 DIAGNOSIS — Z0000 Encounter for general adult medical examination without abnormal findings: Secondary | ICD-10-CM | POA: Diagnosis not present

## 2016-10-04 DIAGNOSIS — I8393 Asymptomatic varicose veins of bilateral lower extremities: Secondary | ICD-10-CM | POA: Diagnosis not present

## 2016-10-04 DIAGNOSIS — Z794 Long term (current) use of insulin: Secondary | ICD-10-CM | POA: Diagnosis not present

## 2016-10-04 DIAGNOSIS — Z01419 Encounter for gynecological examination (general) (routine) without abnormal findings: Secondary | ICD-10-CM | POA: Insufficient documentation

## 2016-10-04 DIAGNOSIS — Z1211 Encounter for screening for malignant neoplasm of colon: Secondary | ICD-10-CM | POA: Insufficient documentation

## 2016-10-04 DIAGNOSIS — E119 Type 2 diabetes mellitus without complications: Secondary | ICD-10-CM | POA: Diagnosis not present

## 2016-10-04 DIAGNOSIS — E785 Hyperlipidemia, unspecified: Secondary | ICD-10-CM | POA: Diagnosis not present

## 2016-10-04 LAB — LIPID PANEL
Chol/HDL Ratio: 4.7 ratio units — ABNORMAL HIGH (ref 0.0–4.4)
Cholesterol, Total: 227 mg/dL — ABNORMAL HIGH (ref 100–199)
HDL: 48 mg/dL (ref >39)
LDL Calculated: 157 mg/dL — ABNORMAL HIGH (ref 0–99)
Triglycerides: 110 mg/dL (ref 0–149)
VLDL Cholesterol Cal: 22 mg/dL (ref 5–40)

## 2016-10-04 LAB — MICROALBUMIN / CREATININE URINE RATIO
CREATININE, UR: 175.8 mg/dL
Microalb/Creat Ratio: 17.4 mg/g creat (ref 0.0–30.0)
Microalbumin, Urine: 30.6 ug/mL

## 2016-10-04 LAB — HEMOGLOBIN A1C
Est. average glucose Bld gHb Est-mCnc: 278 mg/dL
Hgb A1c MFr Bld: 11.3 % — ABNORMAL HIGH (ref 4.8–5.6)

## 2016-10-04 LAB — HEPATIC FUNCTION PANEL
ALT: 31 IU/L (ref 0–32)
AST: 19 IU/L (ref 0–40)
Albumin: 4.5 g/dL (ref 3.5–5.5)
Alkaline Phosphatase: 105 IU/L (ref 39–117)
BILIRUBIN TOTAL: 1.5 mg/dL — AB (ref 0.0–1.2)
Bilirubin, Direct: 0.32 mg/dL (ref 0.00–0.40)
Total Protein: 7.2 g/dL (ref 6.0–8.5)

## 2016-10-04 LAB — VITAMIN D 25 HYDROXY (VIT D DEFICIENCY, FRACTURES): Vit D, 25-Hydroxy: 20.2 ng/mL — ABNORMAL LOW (ref 30.0–100.0)

## 2016-10-04 LAB — TSH: TSH: 2.65 u[IU]/mL (ref 0.450–4.500)

## 2016-10-04 LAB — MAGNESIUM: Magnesium: 1.4 mg/dL — ABNORMAL LOW (ref 1.6–2.3)

## 2016-10-04 MED ORDER — ROSUVASTATIN CALCIUM 5 MG PO TABS: 5.0000 mg | ORAL_TABLET | Freq: Every day | ORAL | 2 refills | Status: DC

## 2016-10-04 NOTE — Patient Instructions (Addendum)
Vitamin D 5,000 - 10,000 units per week Take daily multivitamin for women over 50; chewable or tablet is fine (this will have magnesium)

## 2016-10-05 ENCOUNTER — Encounter: Payer: Self-pay | Admitting: Nurse Practitioner

## 2016-10-05 DIAGNOSIS — I8393 Asymptomatic varicose veins of bilateral lower extremities: Secondary | ICD-10-CM | POA: Insufficient documentation

## 2016-10-05 NOTE — Progress Notes (Signed)
Subjective:    Patient ID: Sarah Pope, female    DOB: 1956/09/07, 60 y.o.   MRN: [id number]  HPI Presents for her wellness exam. No vaginal bleeding or pelvic pain. Same sexual partner. Regular vision and dental exams. Active at work. Having some pain in the lower legs with varicose veins. Not taking Pravachol.     Review of Systems  Constitutional: Positive for fatigue. Negative for activity change and appetite change.  HENT: Negative for dental problem, ear pain, sinus pressure and sore throat.   Respiratory: Negative for cough, chest tightness, shortness of breath and wheezing.   Cardiovascular: Negative for chest pain.  Gastrointestinal: Negative for abdominal distention, abdominal pain, blood in stool, constipation, diarrhea, nausea and vomiting.  Genitourinary: Negative for difficulty urinating, dysuria, enuresis, frequency, genital sores, pelvic pain, urgency, vaginal bleeding and vaginal discharge.       Objective:   Physical Exam  Constitutional: She is oriented to person, place, and time. She appears well-developed. No distress.  HENT:  Right Ear: External ear normal.  Left Ear: External ear normal.  Mouth/Throat: Oropharynx is clear and moist.  Neck: Normal range of motion. Neck supple. No tracheal deviation present. No thyromegaly present.  Cardiovascular: Normal rate, regular rhythm and normal heart sounds.  Exam reveals no gallop.   No murmur heard. Pulmonary/Chest: Effort normal and breath sounds normal.  Abdominal: Soft. She exhibits no distension. There is no tenderness.  Genitourinary: Vagina normal and uterus normal. No vaginal discharge found.  Genitourinary Comments: External GU: no rashes or lesions. Vagina: no discharge. No CMT. Bimanual exam: no tenderness or obvious masses; limited due to abd girth. Rectal exam: no masses; no stool for hemoccult.   Musculoskeletal: She exhibits no edema.  Lymphadenopathy:    She has no cervical adenopathy.    Neurological: She is alert and oriented to person, place, and time.  Skin: Skin is warm and dry. No rash noted.  Significant superficial varicose veins mainly from knees down.   Psychiatric: She has a normal mood and affect. Her behavior is normal.  Vitals reviewed. Breast exam: no masses; axillae no adenopathy.  Diabetic Foot Exam - Simple   Simple Foot Form Diabetic Foot exam was performed with the following findings:  Yes 10/04/2016 10:40 AM  Visual Inspection No deformities, no ulcerations, no other skin breakdown bilaterally:  Yes Sensation Testing Intact to touch and monofilament testing bilaterally:  Yes Pulse Check See comments:  Yes Comments Mild dependent cyanosis of the toes; cool with normal cap refill. DP pulses present bilat. Very thick slightly yellowish nails on both great toes with slight involvement of other toes on the right foot.            Assessment & Plan:   Problem List Items Addressed This Visit      Cardiovascular and Mediastinum   Varicose veins of both lower extremities   Relevant Medications   rosuvastatin (CRESTOR) 5 MG tablet     Endocrine   DM type 2 (diabetes mellitus, type 2) (HCC) - Primary (Chronic)   Relevant Medications   rosuvastatin (CRESTOR) 5 MG tablet   Other Relevant Orders   HgB A1c   Ambulatory referral to diabetic education     Other   Hyperlipidemia LDL goal <100   Relevant Medications   rosuvastatin (CRESTOR) 5 MG tablet   Other Relevant Orders   Hepatic function panel   Lipid Profile   Hypomagnesemia   RESOLVED: Well woman exam with routine gynecological exam  Relevant Orders   POC Hemoccult Bld/Stl (3-Cd Home Screen)     Recheck in 3 months to include A1C, lipid and liver profiles. Start Crestor as directed. Start daily MVI with magnesium. Refer to dietician. Encouraged weight loss.

## 2016-10-06 ENCOUNTER — Encounter: Payer: Self-pay | Admitting: Family Medicine

## 2016-10-09 ENCOUNTER — Other Ambulatory Visit: Payer: Self-pay | Admitting: Nurse Practitioner

## 2016-10-09 DIAGNOSIS — Z1231 Encounter for screening mammogram for malignant neoplasm of breast: Secondary | ICD-10-CM

## 2016-10-18 ENCOUNTER — Other Ambulatory Visit: Payer: Self-pay | Admitting: Nurse Practitioner

## 2016-10-18 ENCOUNTER — Ambulatory Visit (HOSPITAL_COMMUNITY): Payer: Medicaid Other

## 2016-10-18 ENCOUNTER — Ambulatory Visit (HOSPITAL_COMMUNITY)
Admission: RE | Admit: 2016-10-18 | Discharge: 2016-10-18 | Disposition: A | Discharge status: Home / Self Care | Payer: BLUE CROSS/BLUE SHIELD | Source: Ambulatory Visit | Attending: Nurse Practitioner | Admitting: Nurse Practitioner

## 2016-10-18 DIAGNOSIS — Z1231 Encounter for screening mammogram for malignant neoplasm of breast: Secondary | ICD-10-CM | POA: Diagnosis not present

## 2016-10-25 ENCOUNTER — Ambulatory Visit (INDEPENDENT_AMBULATORY_CARE_PROVIDER_SITE_OTHER): Payer: Self-pay | Admitting: Family Medicine

## 2016-10-25 ENCOUNTER — Encounter: Payer: Self-pay | Admitting: Family Medicine

## 2016-10-25 VITALS — BP 102/70 | Temp 98.3°F | Ht 65.5 in | Wt 205.2 lb

## 2016-10-25 DIAGNOSIS — J019 Acute sinusitis, unspecified: Secondary | ICD-10-CM

## 2016-10-25 DIAGNOSIS — B9689 Other specified bacterial agents as the cause of diseases classified elsewhere: Secondary | ICD-10-CM

## 2016-10-25 MED ORDER — AMOXICILLIN 500 MG PO TABS: 500.0000 mg | ORAL_TABLET | Freq: Three times a day (TID) | ORAL | 0 refills | Status: DC

## 2016-10-25 NOTE — Patient Instructions (Signed)
Please do the best he can at getting her sugars under better control. Please write down your readings. Please increase long-acting insulin gradually in order to get your morning sugar between 100-130. Please keep follow-up visit on your diabetes Please send [place] the readout on your readings within the next 3 weeks thank you

## 2016-10-25 NOTE — Progress Notes (Signed)
   Subjective:    Patient ID: Sarah Pope, female    DOB: 1955/12/07, 61 y.o.   MRN: [id number]  Sinusitis  This is a new problem. The current episode started in the past 7 days. The problem is unchanged. The pain is moderate. Associated symptoms include chills, congestion, coughing, ear pain and headaches. Pertinent negatives include no shortness of breath. Past treatments include oral decongestants. The treatment provided no relief.    Patient has no other concerns at this time.    Review of Systems  Constitutional: Positive for chills. Negative for activity change and fever.  HENT: Positive for congestion, ear pain and rhinorrhea.   Eyes: Negative for discharge.  Respiratory: Positive for cough. Negative for shortness of breath and wheezing.   Cardiovascular: Negative for chest pain.  Neurological: Positive for headaches.       Objective:   Physical Exam  Constitutional: She appears well-developed.  HENT:  Head: Normocephalic.  Nose: Nose normal.  Mouth/Throat: Oropharynx is clear and moist. No oropharyngeal exudate.  Neck: Neck supple.  Cardiovascular: Normal rate and normal heart sounds.   No murmur heard. Pulmonary/Chest: Effort normal and breath sounds normal. She has no wheezes.  Lymphadenopathy:    She has no cervical adenopathy.  Skin: Skin is warm and dry.  Nursing note and vitals reviewed.         Assessment & Plan:  Diabetes poor control seeing counselor later this month for diet  Talked at length about the importance of gradually increasing long-acting insulin to get her fasting sugar closer to 100-1 30 she will send [place] some readings in the next several weeks  Sinus infection antibiotics prescribed warning signs discussed

## 2016-10-26 ENCOUNTER — Other Ambulatory Visit: Payer: Self-pay | Admitting: *Deleted

## 2016-10-26 MED ORDER — LOSARTAN POTASSIUM-HCTZ 50-12.5 MG PO TABS: ORAL_TABLET | ORAL | 0 refills | Status: DC

## 2016-11-10 ENCOUNTER — Other Ambulatory Visit: Payer: Self-pay | Admitting: Family Medicine

## 2016-11-15 ENCOUNTER — Ambulatory Visit: Payer: BLUE CROSS/BLUE SHIELD | Admitting: Nutrition

## 2016-12-04 ENCOUNTER — Encounter: Payer: Self-pay | Admitting: Gastroenterology

## 2017-01-03 ENCOUNTER — Ambulatory Visit: Payer: BLUE CROSS/BLUE SHIELD | Admitting: Nurse Practitioner

## 2017-01-20 ENCOUNTER — Other Ambulatory Visit: Payer: Self-pay | Admitting: Nurse Practitioner

## 2017-01-30 ENCOUNTER — Telehealth: Payer: Self-pay | Admitting: Family Medicine

## 2017-01-30 NOTE — Telephone Encounter (Signed)
error 

## 2017-01-31 DIAGNOSIS — R509 Fever, unspecified: Secondary | ICD-10-CM | POA: Diagnosis not present

## 2017-01-31 DIAGNOSIS — J111 Influenza due to unidentified influenza virus with other respiratory manifestations: Secondary | ICD-10-CM | POA: Diagnosis not present

## 2017-02-09 ENCOUNTER — Encounter: Payer: Self-pay | Admitting: Nurse Practitioner

## 2017-02-09 ENCOUNTER — Ambulatory Visit (INDEPENDENT_AMBULATORY_CARE_PROVIDER_SITE_OTHER): Payer: BLUE CROSS/BLUE SHIELD | Admitting: Nurse Practitioner

## 2017-02-09 VITALS — BP 124/80 | Temp 98.0°F | Ht 65.5 in | Wt 201.0 lb

## 2017-02-09 DIAGNOSIS — E119 Type 2 diabetes mellitus without complications: Secondary | ICD-10-CM

## 2017-02-09 DIAGNOSIS — J111 Influenza due to unidentified influenza virus with other respiratory manifestations: Secondary | ICD-10-CM

## 2017-02-09 DIAGNOSIS — E785 Hyperlipidemia, unspecified: Secondary | ICD-10-CM

## 2017-02-09 DIAGNOSIS — I10 Essential (primary) hypertension: Secondary | ICD-10-CM | POA: Diagnosis not present

## 2017-02-09 MED ORDER — HYDROCODONE-HOMATROPINE 5-1.5 MG/5ML PO SYRP: 5.0000 mL | ORAL_SOLUTION | ORAL | 0 refills | Status: DC | PRN

## 2017-02-09 MED ORDER — LOSARTAN POTASSIUM-HCTZ 50-12.5 MG PO TABS: ORAL_TABLET | ORAL | 5 refills | Status: DC

## 2017-02-09 MED ORDER — AMOXICILLIN-POT CLAVULANATE 875-125 MG PO TABS: 1.0000 | ORAL_TABLET | Freq: Two times a day (BID) | ORAL | 0 refills | Status: DC

## 2017-02-12 ENCOUNTER — Encounter: Payer: Self-pay | Admitting: Nurse Practitioner

## 2017-02-12 NOTE — Progress Notes (Signed)
Subjective:  Presents for routine follow up. Has been trying to eat healthier. Active job. No CP/ischemic type pain or SOB. Has not been checking her sugars on a regular basis. Also has a "knot" on the scalp first noticed recently. Non tender. Was treated with Tamiflu for influenza last week. Continues to have frequent cough. Producing yellow sputum x 2 d. No fever. Slight sore throat. No ear pain. Headache. Rare use of albuterol for wheezing. Continues inhaled steroid.   Objective:   BP 124/80   Temp 98 F (36.7 C)   Ht 5' 5.5" (1.664 m)   Wt 201 lb (91.2 kg)   BMI 32.94 kg/m  NAD. Alert, oriented. TMs very retracted, no erythema. Pharynx injected with green PND noted. Neck supple with mild anterior adenopathy. Lungs clear. Frequent congested cough noted. No tachypnea. Heart RRR. Lower extremities no edema.   Assessment:   Problem List Items Addressed This Visit      Cardiovascular and Mediastinum   HTN (hypertension) - Primary (Chronic)   Relevant Medications   losartan-hydrochlorothiazide (HYZAAR) 50-12.5 MG tablet    Other Visit Diagnoses    Influenza with sinusitis       Relevant Medications   HYDROcodone-homatropine (HYCODAN) 5-1.5 MG/5ML syrup   amoxicillin-clavulanate (AUGMENTIN) 875-125 MG tablet   Hyperlipidemia, unspecified hyperlipidemia type       Relevant Medications   losartan-hydrochlorothiazide (HYZAAR) 50-12.5 MG tablet   Other Relevant Orders   Lipid panel   Hepatic function panel   Diabetes mellitus without complication (HCC)       Relevant Medications   losartan-hydrochlorothiazide (HYZAAR) 50-12.5 MG tablet   Other Relevant Orders   Hemoglobin A1C       Plan:   Meds ordered this encounter  Medications  . losartan-hydrochlorothiazide (HYZAAR) 50-12.5 MG tablet    Sig: TAKE 1 BY MOUTH DAILY    Dispense:  30 tablet    Refill:  5    Order Specific Question:   Supervising Provider    Answer:   Mikey Kirschner [[id number]]  . HYDROcodone-homatropine  (HYCODAN) 5-1.5 MG/5ML syrup    Sig: Take 5 mLs by mouth every 4 (four) hours as needed.    Dispense:  90 mL    Refill:  0    Order Specific Question:   Supervising Provider    Answer:   Mikey Kirschner [[id number]]  . amoxicillin-clavulanate (AUGMENTIN) 875-125 MG tablet    Sig: Take 1 tablet by mouth 2 (two) times daily.    Dispense:  20 tablet    Refill:  0    Order Specific Question:   Supervising Provider    Answer:   Mikey Kirschner [[id number]]   Continue albuterol as directed. OTC meds for daytime use. Encouraged continued weight loss efforts and checking blood sugar daily.  Call back next week if no improvement in cough/congestion. Routine follow up in 4 months. Labs pending. Reminded patient about colonoscopy.

## 2017-05-17 ENCOUNTER — Ambulatory Visit: Payer: BLUE CROSS/BLUE SHIELD | Admitting: Family Medicine

## 2017-05-18 ENCOUNTER — Encounter: Payer: Self-pay | Admitting: Nurse Practitioner

## 2017-05-18 ENCOUNTER — Encounter: Payer: Self-pay | Admitting: Family Medicine

## 2017-05-18 ENCOUNTER — Ambulatory Visit (INDEPENDENT_AMBULATORY_CARE_PROVIDER_SITE_OTHER): Payer: BLUE CROSS/BLUE SHIELD | Admitting: Nurse Practitioner

## 2017-05-18 VITALS — BP 132/70 | Ht 65.5 in | Wt 188.0 lb

## 2017-05-18 DIAGNOSIS — B369 Superficial mycosis, unspecified: Secondary | ICD-10-CM | POA: Diagnosis not present

## 2017-05-18 DIAGNOSIS — S91209A Unspecified open wound of unspecified toe(s) with damage to nail, initial encounter: Secondary | ICD-10-CM

## 2017-05-18 MED ORDER — KETOCONAZOLE 2 % EX CREA: 1.0000 "application " | TOPICAL_CREAM | Freq: Two times a day (BID) | CUTANEOUS | 0 refills | Status: DC

## 2017-05-18 MED ORDER — PHENTERMINE HCL 37.5 MG PO TABS: ORAL_TABLET | ORAL | 2 refills | Status: DC

## 2017-05-18 NOTE — Patient Instructions (Signed)
Tea tree oil

## 2017-05-19 ENCOUNTER — Encounter: Payer: Self-pay | Admitting: Nurse Practitioner

## 2017-05-19 NOTE — Progress Notes (Signed)
Subjective:  Presents for c/o pain in the right great toe that began about a week ago after dropping a tray on it. No tenderness in the toe. No difficulty walking. Has a long history of thickened yellowish nails.   Objective:   BP 132/70   Ht 5' 5.5" (1.664 m)   Wt 188 lb (85.3 kg)   BMI 30.81 kg/m  NAD. Alert, oriented. Avulsion of over 1/2 of the toenail at the right great toe with new healthy nail noted at this base. Several nails are thickened. Non tender. No evidence of infection. Strong DP pulses bilat with intact sensation. Toes warm with normal cap refill.   Assessment:  Nail avulsion of toe, initial encounter  Fungal skin infection    Plan:   Meds ordered this encounter  Medications  . ketoconazole (NIZORAL) 2 % cream    Sig: Apply 1 application topically 2 (two) times daily.    Dispense:  30 g    Refill:  0    Order Specific Question:   Supervising Provider    Answer:   Mikey Kirschner [[id number]]  . phentermine (ADIPEX-P) 37.5 MG tablet    Sig: TAKE ONE TABLET BY MOUTH DAILY BEFORE BREAKFAST    Dispense:  30 tablet    Refill:  2    Order Specific Question:   Supervising Provider    Answer:   Mikey Kirschner [[id number]]   Keep nail clean and dry. Reviewed signs of bacterial infection. Most likely fungal nails. Given information on OTC products that may help. Call back if any problems.

## 2017-06-07 ENCOUNTER — Encounter: Payer: Self-pay | Admitting: Family Medicine

## 2017-06-07 ENCOUNTER — Ambulatory Visit (INDEPENDENT_AMBULATORY_CARE_PROVIDER_SITE_OTHER): Payer: BLUE CROSS/BLUE SHIELD | Admitting: Family Medicine

## 2017-06-07 VITALS — BP 124/70 | Temp 99.0°F | Ht 65.5 in | Wt 185.5 lb

## 2017-06-07 DIAGNOSIS — K140 Glossitis: Secondary | ICD-10-CM

## 2017-06-07 DIAGNOSIS — I889 Nonspecific lymphadenitis, unspecified: Secondary | ICD-10-CM

## 2017-06-07 MED ORDER — MAGIC MOUTHWASH: ORAL | 2 refills | Status: DC

## 2017-06-07 MED ORDER — AMOXICILLIN-POT CLAVULANATE 875-125 MG PO TABS: 1.0000 | ORAL_TABLET | Freq: Two times a day (BID) | ORAL | 0 refills | Status: AC

## 2017-06-07 MED ORDER — ONDANSETRON 4 MG PO TBDP: ORAL_TABLET | ORAL | 0 refills | Status: DC

## 2017-06-07 NOTE — Patient Instructions (Signed)
Geographic tongue

## 2017-06-07 NOTE — Progress Notes (Signed)
   Subjective:    Patient ID: Sarah Pope, female    DOB: 1956-05-02, 61 y.o.   MRN: [id number]  Sore Throat   This is a new problem. The current episode started in the past 7 days. Associated symptoms include coughing. She has tried gargles (Nyquil, ) for the symptoms.   Patient states no other concerns this visit.    Pos coughing   Non smoker  Felt a little chilled and diminished energy  Throat sore Using nyquil prn, mouth wash  Sore  Also horse for the last 2 days  Review of Systems  Respiratory: Positive for cough.        Objective:   Physical Exam Alert mild malaise. Hydration good vitals stable HEENT pharynx erythematous tender in naturepositive geographic tongue lungs clear. Heart regular rate and rhythm       Assessment & Plan:  Impression pharyngitis/cervical lymphadenitis/glossitis plan Augmentin twice a day 10 days. Symptom care discussed. Dukes Magic mouthwash proper use discussed

## 2017-06-14 ENCOUNTER — Other Ambulatory Visit: Payer: Self-pay | Admitting: Nurse Practitioner

## 2017-06-14 NOTE — Telephone Encounter (Signed)
Last seen 02/09/17 for medication check

## 2017-07-19 ENCOUNTER — Other Ambulatory Visit: Payer: Self-pay | Admitting: Nurse Practitioner

## 2017-08-14 DIAGNOSIS — E785 Hyperlipidemia, unspecified: Secondary | ICD-10-CM | POA: Diagnosis not present

## 2017-08-14 DIAGNOSIS — E119 Type 2 diabetes mellitus without complications: Secondary | ICD-10-CM | POA: Diagnosis not present

## 2017-08-15 ENCOUNTER — Encounter: Payer: Self-pay | Admitting: Nurse Practitioner

## 2017-08-15 ENCOUNTER — Ambulatory Visit (INDEPENDENT_AMBULATORY_CARE_PROVIDER_SITE_OTHER): Payer: BLUE CROSS/BLUE SHIELD | Admitting: Nurse Practitioner

## 2017-08-15 VITALS — BP 104/70 | Ht 65.5 in | Wt 181.0 lb

## 2017-08-15 DIAGNOSIS — Z794 Long term (current) use of insulin: Secondary | ICD-10-CM

## 2017-08-15 DIAGNOSIS — Z79899 Other long term (current) drug therapy: Secondary | ICD-10-CM

## 2017-08-15 DIAGNOSIS — Z23 Encounter for immunization: Secondary | ICD-10-CM | POA: Diagnosis not present

## 2017-08-15 DIAGNOSIS — I83813 Varicose veins of bilateral lower extremities with pain: Secondary | ICD-10-CM

## 2017-08-15 DIAGNOSIS — E785 Hyperlipidemia, unspecified: Secondary | ICD-10-CM

## 2017-08-15 DIAGNOSIS — E119 Type 2 diabetes mellitus without complications: Secondary | ICD-10-CM | POA: Diagnosis not present

## 2017-08-15 LAB — LIPID PANEL
CHOLESTEROL TOTAL: 189 mg/dL (ref 100–199)
Chol/HDL Ratio: 3.9 ratio (ref 0.0–4.4)
HDL: 49 mg/dL (ref >39)
LDL CALC: 126 mg/dL — AB (ref 0–99)
Triglycerides: 71 mg/dL (ref 0–149)
VLDL Cholesterol Cal: 14 mg/dL (ref 5–40)

## 2017-08-15 LAB — HEPATIC FUNCTION PANEL
ALT: 14 IU/L (ref 0–32)
AST: 12 IU/L (ref 0–40)
Albumin: 4.7 g/dL (ref 3.6–4.8)
Alkaline Phosphatase: 74 IU/L (ref 39–117)
BILIRUBIN TOTAL: 0.8 mg/dL (ref 0.0–1.2)
BILIRUBIN, DIRECT: 0.18 mg/dL (ref 0.00–0.40)
Total Protein: 7.2 g/dL (ref 6.0–8.5)

## 2017-08-15 LAB — HEMOGLOBIN A1C
Est. average glucose Bld gHb Est-mCnc: 166 mg/dL
HEMOGLOBIN A1C: 7.4 % — AB (ref 4.8–5.6)

## 2017-08-15 MED ORDER — ROSUVASTATIN CALCIUM 10 MG PO TABS: 10.0000 mg | ORAL_TABLET | Freq: Every day | ORAL | 5 refills | Status: DC

## 2017-08-15 NOTE — Progress Notes (Signed)
Subjective: Presents for routine follow-up of her diabetes.  Doing much better with her diet.  Compliant with insulin.  Has increased her activity.  Has tried taking Crestor 10 mg in the past with some muscle aches.  Is due for her yearly eye exam.  Has skin cancer screening scheduled next Wednesday.  Would like to be referred to the vein clinic for evaluation of varicose veins.  No chest pain/ischemic type pain shortness of breath or edema.  No TIA symptoms.  NAD.  Alert, oriented.  Objective:   BP 104/70   Ht 5' 5.5" (1.664 m)   Wt 181 lb (82.1 kg)   BMI 29.66 kg/m  Lungs clear.  Heart regular rate and rhythm.  Carotids no bruits or thrills.  Abdomen soft nondistended nontender.  Lower extremities no edema. Results for orders placed or performed in visit on 02/09/17  Lipid panel  Result Value Ref Range   Cholesterol, Total 189 100 - 199 mg/dL   Triglycerides 71 0 - 149 mg/dL   HDL 49 >39 mg/dL   VLDL Cholesterol Cal 14 5 - 40 mg/dL   LDL Calculated 126 (H) 0 - 99 mg/dL   Chol/HDL Ratio 3.9 0.0 - 4.4 ratio  Hepatic function panel  Result Value Ref Range   Total Protein 7.2 6.0 - 8.5 g/dL   Albumin 4.7 3.6 - 4.8 g/dL   Bilirubin Total 0.8 0.0 - 1.2 mg/dL   Bilirubin, Direct 0.18 0.00 - 0.40 mg/dL   Alkaline Phosphatase 74 39 - 117 IU/L   AST 12 0 - 40 IU/L   ALT 14 0 - 32 IU/L  Hemoglobin A1C  Result Value Ref Range   Hgb A1c MFr Bld 7.4 (H) 4.8 - 5.6 %   Est. average glucose Bld gHb Est-mCnc 166 mg/dL   Hemoglobin A1c greatly improved.  Also improvement in LDL but still above goal. Diabetic Foot Exam - Simple   Simple Foot Form Diabetic Foot exam was performed with the following findings:  Yes 08/15/2017 11:00 AM  Visual Inspection No deformities, no ulcerations, no other skin breakdown bilaterally:  Yes Sensation Testing Intact to touch and monofilament testing bilaterally:  Yes Pulse Check See comments:  Yes Comments DP pulses present bilat; toes cool with normal cap  refill; multiple superficial varicose veins along lower legs and ankles      Assessment:   Problem List Items Addressed This Visit      Cardiovascular and Mediastinum   Varicose veins of both lower extremities   Relevant Medications   rosuvastatin (CRESTOR) 10 MG tablet     Endocrine   DM type 2 (diabetes mellitus, type 2) (HCC) - Primary (Chronic)   Relevant Medications   rosuvastatin (CRESTOR) 10 MG tablet     Other   Hyperlipidemia LDL goal <100   Relevant Medications   rosuvastatin (CRESTOR) 10 MG tablet   Other Relevant Orders   Lipid panel    Other Visit Diagnoses    Need for influenza vaccination       Relevant Orders   Flu Vaccine QUAD 36+ mos IM (Completed)   High risk medication use       Relevant Orders   Hepatic function panel       Plan:   Meds ordered this encounter  Medications  . rosuvastatin (CRESTOR) 10 MG tablet    Sig: Take 1 tablet (10 mg total) by mouth daily.    Dispense:  30 tablet    Refill:  5  Order Specific Question:   Supervising Provider    Answer:   Mikey Kirschner [[id number]]   Increase Crestor to 10 mg.  Call back if any severe muscle pain or other adverse effects.  Otherwise recheck lipid and liver profiles in 8 weeks.  Refer to vein clinic for evaluation of varicose veins.  Flu vaccine today.  Strongly recommend eye exam.  Continue current meds as directed. Return in about 4 months (around 12/16/2017) for diabetes check up.

## 2017-08-16 ENCOUNTER — Encounter: Payer: Self-pay | Admitting: Gastroenterology

## 2017-08-22 ENCOUNTER — Other Ambulatory Visit: Payer: Self-pay | Admitting: Physician Assistant

## 2017-08-22 ENCOUNTER — Encounter: Payer: Self-pay | Admitting: Family Medicine

## 2017-09-07 ENCOUNTER — Other Ambulatory Visit: Payer: Self-pay

## 2017-09-07 DIAGNOSIS — I83893 Varicose veins of bilateral lower extremities with other complications: Secondary | ICD-10-CM

## 2017-09-10 ENCOUNTER — Other Ambulatory Visit: Payer: Self-pay | Admitting: Nurse Practitioner

## 2017-09-10 ENCOUNTER — Other Ambulatory Visit: Payer: Self-pay | Admitting: Family Medicine

## 2017-09-24 ENCOUNTER — Other Ambulatory Visit: Payer: Self-pay | Admitting: Nurse Practitioner

## 2017-09-24 DIAGNOSIS — Z1231 Encounter for screening mammogram for malignant neoplasm of breast: Secondary | ICD-10-CM

## 2017-09-28 ENCOUNTER — Other Ambulatory Visit: Payer: Self-pay

## 2017-09-28 ENCOUNTER — Ambulatory Visit (AMBULATORY_SURGERY_CENTER): Payer: Self-pay | Admitting: *Deleted

## 2017-09-28 VITALS — Ht 65.0 in | Wt 181.0 lb

## 2017-09-28 DIAGNOSIS — Z86010 Personal history of colonic polyps: Secondary | ICD-10-CM

## 2017-09-28 MED ORDER — NA SULFATE-K SULFATE-MG SULF 17.5-3.13-1.6 GM/177ML PO SOLN: 1.0000 | Freq: Once | ORAL | 0 refills | Status: AC

## 2017-09-28 NOTE — Progress Notes (Signed)
No egg or soy allergy known to patient  No issues with past sedation with any surgeries  or procedures, no intubation problems - states occ has nausea  No home 02 use per patient  No blood thinners per patient  Pt denies issues with constipation  No A fib or A flutter  EMMI video sent to pt's e mail - pt declined  Pt does take phentermine- instructed to hold x 10 days  15$ suprep coupon to pt today in [place]

## 2017-10-04 ENCOUNTER — Encounter: Payer: Self-pay | Admitting: Gastroenterology

## 2017-10-10 ENCOUNTER — Other Ambulatory Visit: Payer: Self-pay | Admitting: Nurse Practitioner

## 2017-10-12 ENCOUNTER — Ambulatory Visit (AMBULATORY_SURGERY_CENTER): Payer: BLUE CROSS/BLUE SHIELD | Admitting: Gastroenterology

## 2017-10-12 ENCOUNTER — Encounter: Payer: Self-pay | Admitting: Gastroenterology

## 2017-10-12 ENCOUNTER — Other Ambulatory Visit: Payer: Self-pay

## 2017-10-12 VITALS — BP 112/67 | HR 76 | Temp 98.7°F | Resp 13 | Ht 65.0 in | Wt 181.0 lb

## 2017-10-12 DIAGNOSIS — Z86010 Personal history of colonic polyps: Secondary | ICD-10-CM | POA: Diagnosis not present

## 2017-10-12 MED ORDER — SODIUM CHLORIDE 0.9 % IV SOLN: 500.0000 mL | INTRAVENOUS | Status: DC

## 2017-10-12 NOTE — Patient Instructions (Signed)
YOU HAD AN ENDOSCOPIC PROCEDURE TODAY AT THE [facility]:   Refer to the procedure report that was given to you for any specific questions about what was found during the examination.  If the procedure report does not answer your questions, please call your gastroenterologist to clarify.  If you requested that your care partner not be given the details of your procedure findings, then the procedure report has been included in a sealed envelope for you to review at your convenience later.  YOU SHOULD EXPECT: Some feelings of bloating in the abdomen. Passage of more gas than usual.  Walking can help get rid of the air that was put into your GI tract during the procedure and reduce the bloating. If you had a lower endoscopy (such as a colonoscopy or flexible sigmoidoscopy) you may notice spotting of blood in your stool or on the toilet paper. If you underwent a bowel prep for your procedure, you may not have a normal bowel movement for a few days.  Please Note:  You might notice some irritation and congestion in your nose or some drainage.  This is from the oxygen used during your procedure.  There is no need for concern and it should clear up in a day or so.  SYMPTOMS TO REPORT IMMEDIATELY:   Following lower endoscopy (colonoscopy or flexible sigmoidoscopy):  Excessive amounts of blood in the stool  Significant tenderness or worsening of abdominal pains  Swelling of the abdomen that is new, acute  Fever of 100F or higher  For urgent or emergent issues, a gastroenterologist can be reached at any hour by calling [phone].   DIET:  We do recommend a small meal at first, but then you may proceed to your regular diet.  Drink plenty of fluids but you should avoid alcoholic beverages for 24 hours.  ACTIVITY:  You should plan to take it easy for the rest of today and you should NOT DRIVE or use heavy machinery until tomorrow (because of the sedation medicines used during the test).     FOLLOW UP: Our staff will call the number listed on your records the next business day following your procedure to check on you and address any questions or concerns that you may have regarding the information given to you following your procedure. If we do not reach you, we will leave a message.  However, if you are feeling well and you are not experiencing any problems, there is no need to return our call.  We will assume that you have returned to your regular daily activities without incident.  If any biopsies were taken you will be contacted by phone or by letter within the next 1-3 weeks.  Please call us at [phone] if you have not heard about the biopsies in 3 weeks.    SIGNATURES/CONFIDENTIALITY: You and/or your care partner have signed paperwork which will be entered into your electronic medical record.  These signatures attest to the fact that that the information above on your After Visit Summary has been reviewed and is understood.  Full responsibility of the confidentiality of this discharge information lies with you and/or your care-partner. 

## 2017-10-12 NOTE — Op Note (Signed)
[facility] Patient Name: Sarah Pope Procedure Date: 10/12/2017 10:22 AM MRN: [id number] Endoscopist: Ladene Artist , MD Age: 61 Referring MD:  Date of Birth: Mar 27, 1956 Gender: Female Account #: [id number] Procedure:                Colonoscopy Indications:              Surveillance: Personal history of adenomatous                            polyps on last colonoscopy > 5 years ago Medicines:                Monitored Anesthesia Care Procedure:                Pre-Anesthesia Assessment:                           - Prior to the procedure, a History and Physical                            was performed, and patient medications and                            allergies were reviewed. The patient's tolerance of                            previous anesthesia was also reviewed. The risks                            and benefits of the procedure and the sedation                            options and risks were discussed with the patient.                            All questions were answered, and informed consent                            was obtained. Prior Anticoagulants: The patient has                            taken no previous anticoagulant or antiplatelet                            agents. ASA Grade Assessment: III - A patient with                            severe systemic disease. After reviewing the risks                            and benefits, the patient was deemed in                            satisfactory condition to undergo the procedure.  After obtaining informed consent, the colonoscope                            was passed under direct vision. Throughout the                            procedure, the patient's blood pressure, pulse, and                            oxygen saturations were monitored continuously. The                            Colonoscope was introduced through the anus and                            advanced to the the  cecum, identified by                            appendiceal orifice and ileocecal valve. The                            ileocecal valve, appendiceal orifice, and rectum                            were photographed. The quality of the bowel                            preparation was adequate to identify polyps 6 mm                            and larger in size. The colonoscopy was performed                            without difficulty. The patient tolerated the                            procedure well. Scope In: 10:25:12 AM Scope Out: 10:40:20 AM Scope Withdrawal Time: 0 hours 13 minutes 20 seconds  Total Procedure Duration: 0 hours 15 minutes 8 seconds  Findings:                 The perianal and digital rectal examinations were                            normal.                           The entire examined colon appeared normal on direct                            and retroflexion views. Complications:            No immediate complications. Estimated blood loss:                            None. Estimated Blood  Loss:     Estimated blood loss: none. Impression:               - The entire examined colon is normal on direct and                            retroflexion views.                           - No specimens collected. Recommendation:           - Repeat colonoscopy in 3 years for surveillance                            with a more extensive bowel prep.                           - Patient has a contact number available for                            emergencies. The signs and symptoms of potential                            delayed complications were discussed with the                            patient. Return to normal activities tomorrow.                            Written discharge instructions were provided to the                            patient.                           - Resume previous diet.                           - Continue present medications. Ladene Artist,  MD 10/12/2017 10:43:44 AM This report has been signed electronically.

## 2017-10-12 NOTE — Progress Notes (Signed)
To recovery, report to RN, VSS. 

## 2017-10-12 NOTE — Progress Notes (Signed)
Pt's states no medical or surgical changes since previsit or office visit. 

## 2017-10-15 ENCOUNTER — Telehealth: Payer: Self-pay | Admitting: *Deleted

## 2017-10-15 NOTE — Telephone Encounter (Signed)
  Follow up Call-  Call back number 10/12/2017  Post procedure Call Back phone  # [phone]  Permission to leave phone message Yes  Some recent data might be hidden     Patient questions:  Do you have a fever, pain , or abdominal swelling? No. Pain Score  0 *  Have you tolerated food without any problems? Yes.    Have you been able to return to your normal activities? Yes.    Do you have any questions about your discharge instructions: Diet   No. Medications  No. Follow up visit  No.  Do you have questions or concerns about your Care? Yes.    Actions: * If pain score is 4 or above: No action needed, pain <4.

## 2017-10-15 NOTE — Telephone Encounter (Signed)
No answer, left message to call if questions or concerns. 

## 2017-10-17 ENCOUNTER — Ambulatory Visit (INDEPENDENT_AMBULATORY_CARE_PROVIDER_SITE_OTHER): Payer: BLUE CROSS/BLUE SHIELD | Admitting: Surgery

## 2017-10-17 ENCOUNTER — Ambulatory Visit (HOSPITAL_COMMUNITY)
Admission: RE | Admit: 2017-10-17 | Discharge: 2017-10-17 | Disposition: A | Discharge status: Home / Self Care | Payer: BLUE CROSS/BLUE SHIELD | Source: Ambulatory Visit | Attending: Surgery | Admitting: Surgery

## 2017-10-17 ENCOUNTER — Encounter: Payer: Self-pay | Admitting: Surgery

## 2017-10-17 VITALS — BP 113/68 | HR 72 | Temp 98.2°F | Resp 18 | Ht 65.0 in | Wt 186.1 lb

## 2017-10-17 DIAGNOSIS — I872 Venous insufficiency (chronic) (peripheral): Secondary | ICD-10-CM | POA: Diagnosis not present

## 2017-10-17 DIAGNOSIS — I83893 Varicose veins of bilateral lower extremities with other complications: Secondary | ICD-10-CM | POA: Diagnosis not present

## 2017-10-17 NOTE — Progress Notes (Signed)
[facility] [place]   Reason for referral: Swollen B legs   History of Present Illness  Sarah Pope is a 61 y.o. female who presents with chief complaint: swollen legs  with multiple spider veins..  Patient notes, onset of swelling more than 2 years  ago, associated with prolonged sitting and standing.  Her legs get progressively heavier and swollen through out the day.  The patient has had no history of DVT, no history of varicose vein, no history of venous stasis ulcers, no history of  Lymphedema and no history of skin changes in lower legs other than increasing spider veins.  There is a family history of venous disorders.  The patient has not used compression stockings in the past.  Past medical DM managed with insulin, HTN managed with Losartan/HCTZ, and Hyperlipidemia managed with Crestor.     Past Medical History:  Diagnosis Date  . Acute respiratory failure with hypercapnia ([place])   . Allergy   . Anxiety   . Asthma   . Bronchitis with bronchospasm   . Cancer ([place])    skin  . Depression   . DM type 2 (diabetes mellitus, type 2) ([place])   . FATTY LIVER DISEASE 08/05/2008  . GERD (gastroesophageal reflux disease)   . Hyperlipidemia   . Hypertension   . Hypothyroidism   . Leukocytosis   . Obesity, Class III, BMI 40-49.9 (morbid obesity) ([place])   . PERSONAL HX COLONIC POLYPS 09/01/2008  . Seasonal allergies   . TRANSAMINASES, SERUM, ELEVATED 08/05/2008    Past Surgical History:  Procedure Laterality Date  . COLONOSCOPY  2013   Repeat 10 years  . MANDIBLE SURGERY  1988   realignment  . NOSE SURGERY  2007   sinus infections  . POLYPECTOMY    . skin cancer removal    . TONSILLECTOMY      Social History   Socioeconomic History  . Marital status: Divorced    Spouse name: Not on file  . Number of children: Not on file  . Years of education: Not on file  . Highest education level: Not on file  Social Needs  . Financial resource strain: Not on  file  . Food insecurity - worry: Not on file  . Food insecurity - inability: Not on file  . Transportation needs - medical: Not on file  . Transportation needs - non-medical: Not on file  Occupational History  . Occupation: Lead Scientist, clinical (histocompatibility and immunogenetics) at [employer]  . Smoking status: Former Smoker    Packs/day: 0.50    Years: 5.00    Pack years: 2.50    Types: Cigarettes    Last attempt to quit: 10/23/1982    Years since quitting: 35.0  . Smokeless tobacco: Never Used  Substance and Sexual Activity  . Alcohol use: No  . Drug use: No  . Sexual activity: Not on file  Other Topics Concern  . Not on file  Social History Narrative  . Not on file    Family History  Problem Relation Age of Onset  . Allergies Daughter   . Breast cancer Mother   . Diabetes Mother   . Prostate cancer Father   . Heart attack Maternal Grandmother   . Colon cancer Neg Hx   . Esophageal cancer Neg Hx   . Rectal cancer Neg Hx   . Stomach cancer Neg Hx   . Colon polyps Neg Hx     Current Outpatient Medications on File Prior  to Visit  Medication Sig Dispense Refill  . albuterol (PROVENTIL HFA;VENTOLIN HFA) 108 (90 BASE) MCG/ACT inhaler Inhale 2 puffs into the lungs every 4 (four) hours as needed for wheezing or shortness of breath. 1 Inhaler 5  . aspirin EC 81 MG tablet Take 81 mg by mouth daily.    . beclomethasone (QVAR) 80 MCG/ACT inhaler Inhale 1 puff into the lungs 2 (two) times daily. 3 Inhaler 1  . budesonide (PULMICORT FLEXHALER) 180 MCG/ACT inhaler Inhale 2 puffs into the lungs 2 (two) times daily. 3 Inhaler 1  . cetirizine (ZYRTEC) 10 MG tablet Take 10 mg by mouth daily.    . citalopram (CELEXA) 40 MG tablet TAKE ONE TABLET BY MOUTH DAILY. 30 tablet 2  . insulin glargine (LANTUS) 100 UNIT/ML injection Inject 0.6 mLs (60 Units total) into the skin at bedtime. The dose has been increased to 40 units. 5 mL 1  . insulin lispro (HUMALOG) 100 UNIT/ML injection Check your blood sugars at least twice  daily preferably 3 times daily. Sliding scale: For a blood sugar of 121-150 give yourself 3 units; for blood sugar of 151-200 give 4 units; for blood sugar of 201-250 give 6 units; for blood sugar of 251-300 give 9 units; for blood sugar of 301-350 give 13 units; for blood glucose of 351-400 give 17 units. 10 mL 1  . ketoconazole (NIZORAL) 2 % cream Apply 1 application topically 2 (two) times daily. 30 g 0  . levothyroxine (SYNTHROID, LEVOTHROID) 175 MCG tablet TAKE ONE TABLET BY MOUTH DAILY 30 tablet 5  . losartan-hydrochlorothiazide (HYZAAR) 50-12.5 MG tablet TAKE ONE TABLET BY MOUTH DAILY. 30 tablet 5  . magic mouthwash SOLN Swish and spit 1 tablespoon 4 times a day for 10 days 360 mL 2  . metFORMIN (GLUCOPHAGE) 1000 MG tablet TAKE ONE TABLET BY MOUTH TWICE DAILY WITH A MEAL 60 tablet 5  . ondansetron (ZOFRAN ODT) 4 MG disintegrating tablet Take 1 tablet by mouth every 6 hours as needed for nausea. 16 tablet 0  . oxazepam (SERAX) 10 MG capsule TAKE ONE CAPSULE BY MOUTH TWICE DAILY AS NEEDED. 60 capsule 5  . pantoprazole (PROTONIX) 40 MG tablet Take 40 mg by mouth daily.    . phentermine (ADIPEX-P) 37.5 MG tablet TAKE ONE TABLET BY MOUTH DAILY BEFORE BREAKFAST 30 tablet 2  . rosuvastatin (CRESTOR) 10 MG tablet Take 1 tablet (10 mg total) by mouth daily. 30 tablet 5  . SF 5000 PLUS 1.1 % CREA dental cream 3 (three) times daily. as directed  99   Current Facility-Administered Medications on File Prior to Visit  Medication Dose Route Frequency Provider Last Rate Last Dose  . 0.9 %  sodium chloride infusion  500 mL Intravenous Continuous Ladene Artist, MD        Allergies as of 10/17/2017 - Review Complete 10/17/2017  Allergen Reaction Noted  . Codeine Nausea Only   . Sulfa antibiotics  09/01/2012     ROS:   General:  No weight loss, Fever, chills  HEENT: No recent headaches, no nasal bleeding, no visual changes, no sore throat  Neurologic: No dizziness, blackouts, seizures. No recent  symptoms of stroke or mini- stroke. No recent episodes of slurred speech, or temporary blindness.  Cardiac: No recent episodes of chest pain/pressure, no shortness of breath at rest.  No shortness of breath with exertion.  Denies history of atrial fibrillation or irregular heartbeat  Vascular: No history of rest pain in feet.  No history of claudication.  No history of non-healing ulcer, No history of DVT   Pulmonary: No home oxygen, no productive cough, no hemoptysis,  No asthma or wheezing  Musculoskeletal:  [x ] Arthritis, [ ]  Low back pain,  [ ]  Joint pain  Hematologic:No history of hypercoagulable state.  No history of easy bleeding.  No history of anemia  Gastrointestinal: No hematochezia or melena,  No gastroesophageal reflux, no trouble swallowing  Urinary: [ ]  chronic Kidney disease, [ ]  on HD - [ ]  MWF or [ ]  TTHS, [ ]  Burning with urination, [ ]  Frequent urination, [ ]  Difficulty urinating;   Skin: No rashes  Psychological: No history of anxiety,  No history of depression  Physical Examination  Vitals:   10/17/17 1310  BP: 113/68  Pulse: 72  Resp: 18  Temp: 98.2 F (36.8 C)  TempSrc: Oral  SpO2: 99%  Weight: 186 lb 1.6 oz (84.4 kg)  Height: 5\' 5"  (1.651 m)    Body mass index is 30.97 kg/m.  General:  Alert and oriented, no acute distress HEENT: Normal Neck: No bruit or JVD Pulmonary: Clear to auscultation bilaterally Cardiac: Regular Rate and Rhythm without murmur Abdomen: Soft, non-tender, non-distended, no mass, no scars Skin: No rash, multiple areas of posterior popliteal and lower leg spider veins Extremity Pulses:  2+ radial, brachial, femoral, dorsalis pedis, posterior tibial pulses bilaterally Musculoskeletal: No deformity or pitting edema  Neurologic: Upper and lower extremity motor 5/5 and symmetric  DATA: Reflux study shows reflux bilaterally at the sf junction and right small saphenous vein, left great saphenous reflux.   GSV on the left  proximal thigh 0.54 cm and less throughout. Right GSV is within normal diameter and no reflux.   Assessment: Venous insuffiencey CFV reflux  Plan: She has symptoms of venous insufficiency in both LE.  She is worried about the multiple spider veins in both legs and that they are multiplying over the years.  We recommend thigh high compression garments daily, elevation when at rest.  We gave her our vein specialist RN information and she will call her to make an appointment to consider sclerotherapy for her spider veins.  F/U as needed in the future.    Roxy Horseman PA-C [facility]  The patient was seen in conjunction with Dr. Trula Slade  I agree with the above.  I have seen and evaluated the patient.  She is concerned about the reticular/spider veins and bilateral lower extremities which caused her a fair amount of pain at the end of the day when she has been standing on them at work.  She also notes that she will have some swelling in her legs after prolonged standing.  She has never tried compression stockings.  I have reviewed her vascular lab studies.  This was negative for DVT.  She does have reflux in the common femoral vein and several areas of reflux in the left great saphenous and right small saphenous vein.  I told her that the areas of reflux are associated with vein diameters that are not large enough where I would consider endovenous laser treatment of the saphenous vein.  Therefore I think her biggest issue is the treatment of her surface veins.  She is amenable to sclerotherapy and or laser treatment of these areas.  I am going to have her contact Thea Silversmith next week to schedule a meeting for possible treatment.  In the meantime, I have encouraged her to wear compression stockings which should help  alleviate some of her symptoms.  Annamarie Major

## 2017-10-19 ENCOUNTER — Encounter (HOSPITAL_COMMUNITY): Payer: Self-pay

## 2017-10-19 ENCOUNTER — Ambulatory Visit (HOSPITAL_COMMUNITY)
Admission: RE | Admit: 2017-10-19 | Discharge: 2017-10-19 | Disposition: A | Discharge status: Home / Self Care | Payer: BLUE CROSS/BLUE SHIELD | Source: Ambulatory Visit | Attending: Nurse Practitioner | Admitting: Nurse Practitioner

## 2017-10-19 DIAGNOSIS — Z1231 Encounter for screening mammogram for malignant neoplasm of breast: Secondary | ICD-10-CM | POA: Diagnosis not present

## 2017-10-29 ENCOUNTER — Ambulatory Visit (INDEPENDENT_AMBULATORY_CARE_PROVIDER_SITE_OTHER): Payer: BLUE CROSS/BLUE SHIELD | Admitting: Family Medicine

## 2017-10-29 ENCOUNTER — Encounter: Payer: Self-pay | Admitting: Family Medicine

## 2017-10-29 VITALS — BP 122/82 | Temp 98.2°F | Ht 65.0 in | Wt 179.2 lb

## 2017-10-29 DIAGNOSIS — J329 Chronic sinusitis, unspecified: Secondary | ICD-10-CM | POA: Diagnosis not present

## 2017-10-29 MED ORDER — AMOXICILLIN-POT CLAVULANATE 875-125 MG PO TABS: ORAL_TABLET | ORAL | 0 refills | Status: DC

## 2017-10-29 NOTE — Progress Notes (Signed)
Subjective:    Patient ID: Sarah Pope, female    DOB: 08-16-1956, 62 y.o.   MRN: [id number]  Cough  This is a new problem. The current episode started in the past 7 days. Associated symptoms include nasal congestion. Associated symptoms comments: Sinus pressure and sinus congestion . Treatments tried: otc meds.   Started with cong and dranage and sniffles  Now stopped up bad,  Yellow disch now  Frontal heafache, shar, aching, worse with change of position  Mid lst week started   No sig progressio toe vhdrst yet  No obv fver, some dim erngy and sweatoing    Review of Systems  Respiratory: Positive for cough.        Objective:   Physical Exam  Alert, mild malaise. Hydration good Vitals stable. frontal/ maxillary tenderness evident positive nasal congestion. pharynx normal neck supple  lungs clear/no crackles or wheezes. heart regular in rhythm       Assessment & Plan:  Impression rhinosinusitis likely post viral, discussed with patient. plan antibiotics prescribed. Questions answered. Symptomatic care discussed. warning signs discussed. WSL

## 2017-12-03 ENCOUNTER — Emergency Department (HOSPITAL_COMMUNITY): Payer: BLUE CROSS/BLUE SHIELD

## 2017-12-03 ENCOUNTER — Emergency Department (HOSPITAL_COMMUNITY)
Admission: EM | Admit: 2017-12-03 | Discharge: 2017-12-03 | Disposition: A | Discharge status: Home / Self Care | Payer: BLUE CROSS/BLUE SHIELD | Attending: Emergency Medicine | Admitting: Emergency Medicine

## 2017-12-03 ENCOUNTER — Encounter (HOSPITAL_COMMUNITY): Payer: Self-pay

## 2017-12-03 DIAGNOSIS — E039 Hypothyroidism, unspecified: Secondary | ICD-10-CM | POA: Diagnosis not present

## 2017-12-03 DIAGNOSIS — Z794 Long term (current) use of insulin: Secondary | ICD-10-CM | POA: Insufficient documentation

## 2017-12-03 DIAGNOSIS — R112 Nausea with vomiting, unspecified: Secondary | ICD-10-CM | POA: Diagnosis not present

## 2017-12-03 DIAGNOSIS — E119 Type 2 diabetes mellitus without complications: Secondary | ICD-10-CM | POA: Insufficient documentation

## 2017-12-03 DIAGNOSIS — Z85828 Personal history of other malignant neoplasm of skin: Secondary | ICD-10-CM | POA: Insufficient documentation

## 2017-12-03 DIAGNOSIS — N201 Calculus of ureter: Secondary | ICD-10-CM | POA: Diagnosis not present

## 2017-12-03 DIAGNOSIS — Z87891 Personal history of nicotine dependence: Secondary | ICD-10-CM | POA: Insufficient documentation

## 2017-12-03 DIAGNOSIS — J45909 Unspecified asthma, uncomplicated: Secondary | ICD-10-CM | POA: Diagnosis not present

## 2017-12-03 DIAGNOSIS — N23 Unspecified renal colic: Secondary | ICD-10-CM | POA: Diagnosis not present

## 2017-12-03 DIAGNOSIS — Z79899 Other long term (current) drug therapy: Secondary | ICD-10-CM | POA: Insufficient documentation

## 2017-12-03 DIAGNOSIS — I10 Essential (primary) hypertension: Secondary | ICD-10-CM | POA: Insufficient documentation

## 2017-12-03 DIAGNOSIS — R1032 Left lower quadrant pain: Secondary | ICD-10-CM | POA: Diagnosis not present

## 2017-12-03 DIAGNOSIS — N132 Hydronephrosis with renal and ureteral calculous obstruction: Secondary | ICD-10-CM | POA: Diagnosis not present

## 2017-12-03 HISTORY — DX: Calculus of kidney: N20.0

## 2017-12-03 LAB — CBC WITH DIFFERENTIAL/PLATELET
Basophils Absolute: 0 10*3/uL (ref 0.0–0.1)
Basophils Relative: 0 %
EOS PCT: 1 %
Eosinophils Absolute: 0.1 10*3/uL (ref 0.0–0.7)
HCT: 41 % (ref 36.0–46.0)
Hemoglobin: 13.2 g/dL (ref 12.0–15.0)
LYMPHS ABS: 1.5 10*3/uL (ref 0.7–4.0)
Lymphocytes Relative: 13 %
MCH: 26.1 pg (ref 26.0–34.0)
MCHC: 32.2 g/dL (ref 30.0–36.0)
MCV: 81 fL (ref 78.0–100.0)
MONO ABS: 0.7 10*3/uL (ref 0.1–1.0)
MONOS PCT: 6 %
Neutro Abs: 9.6 10*3/uL — ABNORMAL HIGH (ref 1.7–7.7)
Neutrophils Relative %: 80 %
PLATELETS: 229 10*3/uL (ref 150–400)
RBC: 5.06 MIL/uL (ref 3.87–5.11)
RDW: 12.4 % (ref 11.5–15.5)
WBC: 11.9 10*3/uL — ABNORMAL HIGH (ref 4.0–10.5)

## 2017-12-03 LAB — COMPREHENSIVE METABOLIC PANEL
ALT: 15 U/L (ref 14–54)
AST: 18 U/L (ref 15–41)
Albumin: 4 g/dL (ref 3.5–5.0)
Alkaline Phosphatase: 72 U/L (ref 38–126)
Anion gap: 11 (ref 5–15)
BUN: 16 mg/dL (ref 6–20)
CHLORIDE: 98 mmol/L — AB (ref 101–111)
CO2: 28 mmol/L (ref 22–32)
CREATININE: 0.6 mg/dL (ref 0.44–1.00)
Calcium: 9.5 mg/dL (ref 8.9–10.3)
Glucose, Bld: 261 mg/dL — ABNORMAL HIGH (ref 65–99)
POTASSIUM: 4.5 mmol/L (ref 3.5–5.1)
Sodium: 137 mmol/L (ref 135–145)
Total Bilirubin: 0.9 mg/dL (ref 0.3–1.2)
Total Protein: 7.2 g/dL (ref 6.5–8.1)

## 2017-12-03 LAB — URINALYSIS, ROUTINE W REFLEX MICROSCOPIC
Bacteria, UA: NONE SEEN
Bilirubin Urine: NEGATIVE
GLUCOSE, UA: NEGATIVE mg/dL
Ketones, ur: NEGATIVE mg/dL
Leukocytes, UA: NEGATIVE
NITRITE: NEGATIVE
PROTEIN: NEGATIVE mg/dL
Specific Gravity, Urine: 1.019 (ref 1.005–1.030)
pH: 7 (ref 5.0–8.0)

## 2017-12-03 LAB — LIPASE, BLOOD: Lipase: 37 U/L (ref 11–51)

## 2017-12-03 MED ORDER — ONDANSETRON 4 MG PO TBDP: 4.0000 mg | ORAL_TABLET | Freq: Three times a day (TID) | ORAL | 0 refills | Status: DC | PRN

## 2017-12-03 MED ORDER — OXYCODONE-ACETAMINOPHEN 5-325 MG PO TABS
2.0000 | ORAL_TABLET | Freq: Once | ORAL | Status: AC
  Administered 2017-12-03: 2 via ORAL
  Filled 2017-12-03: qty 2

## 2017-12-03 MED ORDER — OXYCODONE-ACETAMINOPHEN 5-325 MG PO TABS: ORAL_TABLET | ORAL | 0 refills | Status: DC

## 2017-12-03 MED ORDER — MORPHINE SULFATE (PF) 4 MG/ML IV SOLN
4.0000 mg | Freq: Once | INTRAVENOUS | Status: AC
  Administered 2017-12-03: 4 mg via INTRAVENOUS
  Filled 2017-12-03: qty 1

## 2017-12-03 MED ORDER — ONDANSETRON 8 MG PO TBDP
8.0000 mg | ORAL_TABLET | Freq: Once | ORAL | Status: AC
Start: 2017-12-03 — End: 2017-12-03
  Administered 2017-12-03: 8 mg via ORAL
  Filled 2017-12-03: qty 1

## 2017-12-03 NOTE — ED Triage Notes (Signed)
Pt reports woke up this morning with pain in llq and nausea.  LBM was this morning and was normal per pt.  Denies any diarrhea or urinary symptoms.  Denies any abnormal vaginal bleeding or discharge.   Reports history of kidney stones years ago.

## 2017-12-03 NOTE — Discharge Instructions (Signed)
Take the prescriptions as directed.  Call the Urologist today to schedule a follow up appointment in the next 3 days.  Return to the Emergency Department immediately if worsening.

## 2017-12-03 NOTE — ED Provider Notes (Signed)
[facility] EMERGENCY DEPARTMENT Provider Note   CSN: [id number] Arrival date & time: 12/03/17  3790     History   Chief Complaint Chief Complaint  Patient presents with  . Abdominal Pain    HPI Sarah Pope is a 62 y.o. female.  HPI  Pt was seen at 1020.  Per pt, c/o sudden onset and persistence of constant LLQ abd "pain" that began this morning.  Has been associated with multiple intermittent episodes of N/V. Denies hx of similar symptoms. Denies diarrhea, no fevers, no back pain, no rash, no CP/SOB, no black or blood in stools or emesis, no dysuria/hematuria.      Past Medical History:  Diagnosis Date  . Acute respiratory failure with hypercapnia ([place])   . Allergy   . Anxiety   . Asthma   . Bronchitis with bronchospasm   . Cancer ([place])    skin  . Depression   . DM type 2 (diabetes mellitus, type 2) ([place])   . FATTY LIVER DISEASE 08/05/2008  . GERD (gastroesophageal reflux disease)   . Hyperlipidemia   . Hypertension   . Hypothyroidism   . Kidney stones   . Leukocytosis   . Obesity, Class III, BMI 40-49.9 (morbid obesity) ([place])   . PERSONAL HX COLONIC POLYPS 09/01/2008  . Seasonal allergies   . TRANSAMINASES, SERUM, ELEVATED 08/05/2008    Patient Active Problem List   Diagnosis Date Noted  . Hypomagnesemia 10/05/2016  . Varicose veins of both lower extremities 10/05/2016  . Depression 09/29/2015  . Hyperlipidemia LDL goal <100 02/05/2013  . OSA (obstructive sleep apnea) 12/02/2012  . GERD (gastroesophageal reflux disease) 10/23/2012  . Moderate persistent asthma with reflux disease 09/02/2012  . DM type 2 (diabetes mellitus, type 2) ([place]) 06/30/2012  . HTN (hypertension) 06/30/2012  . Seasonal allergies 06/30/2012  . Hypothyroidism 06/30/2012  . Elevated LFTs 06/30/2012  . OBESITY 08/05/2008    Past Surgical History:  Procedure Laterality Date  . COLONOSCOPY  2013   Repeat 10 years  . MANDIBLE SURGERY  1988   realignment  . NOSE SURGERY  2007     sinus infections  . POLYPECTOMY    . skin cancer removal    . TONSILLECTOMY      OB History    No data available       Home Medications    Prior to Admission medications   Medication Sig Start Date End Date Taking? Authorizing Provider  albuterol (PROVENTIL HFA;VENTOLIN HFA) 108 (90 BASE) MCG/ACT inhaler Inhale 2 puffs into the lungs every 4 (four) hours as needed for wheezing or shortness of breath. 04/16/14   Nilda Simmer, NP  amoxicillin-clavulanate (AUGMENTIN) 875-125 MG tablet One bid for ten d 10/29/17   Mikey Kirschner, MD  aspirin EC 81 MG tablet Take 81 mg by mouth daily.    [provider]  beclomethasone (QVAR) 80 MCG/ACT inhaler Inhale 1 puff into the lungs 2 (two) times daily. 10/19/15   Mikey Kirschner, MD  budesonide (PULMICORT FLEXHALER) 180 MCG/ACT inhaler Inhale 2 puffs into the lungs 2 (two) times daily. 10/19/15   Mikey Kirschner, MD  cetirizine (ZYRTEC) 10 MG tablet Take 10 mg by mouth daily.    [provider]  citalopram (CELEXA) 40 MG tablet TAKE ONE TABLET BY MOUTH DAILY. 09/11/17   Mikey Kirschner, MD  insulin glargine (LANTUS) 100 UNIT/ML injection Inject 0.6 mLs (60 Units total) into the skin at bedtime. The dose has been increased to  40 units. 06/28/16   Nilda Simmer, NP  insulin lispro (HUMALOG) 100 UNIT/ML injection Check your blood sugars at least twice daily preferably 3 times daily. Sliding scale: For a blood sugar of 121-150 give yourself 3 units; for blood sugar of 151-200 give 4 units; for blood sugar of 201-250 give 6 units; for blood sugar of 251-300 give 9 units; for blood sugar of 301-350 give 13 units; for blood glucose of 351-400 give 17 units. 10/19/15   Mikey Kirschner, MD  ketoconazole (NIZORAL) 2 % cream Apply 1 application topically 2 (two) times daily. 05/18/17   Nilda Simmer, NP  levothyroxine (SYNTHROID, LEVOTHROID) 175 MCG tablet TAKE ONE TABLET BY MOUTH DAILY 07/20/17   Nilda Simmer, NP   losartan-hydrochlorothiazide (HYZAAR) 50-12.5 MG tablet TAKE ONE TABLET BY MOUTH DAILY. 10/10/17   Nilda Simmer, NP  magic mouthwash SOLN Swish and spit 1 tablespoon 4 times a day for 10 days 06/07/17   Mikey Kirschner, MD  metFORMIN (GLUCOPHAGE) 1000 MG tablet TAKE ONE TABLET BY MOUTH TWICE DAILY WITH A MEAL 07/20/17   Nilda Simmer, NP  ondansetron (ZOFRAN ODT) 4 MG disintegrating tablet Take 1 tablet by mouth every 6 hours as needed for nausea. 06/07/17   Mikey Kirschner, MD  oxazepam (SERAX) 10 MG capsule TAKE ONE CAPSULE BY MOUTH TWICE DAILY AS NEEDED. 09/12/17   Nilda Simmer, NP  pantoprazole (PROTONIX) 40 MG tablet Take 40 mg by mouth daily.    [provider]  phentermine (ADIPEX-P) 37.5 MG tablet TAKE ONE TABLET BY MOUTH DAILY BEFORE BREAKFAST 05/18/17   Nilda Simmer, NP  rosuvastatin (CRESTOR) 10 MG tablet Take 1 tablet (10 mg total) by mouth daily. 08/15/17   Nilda Simmer, NP  SF 5000 PLUS 1.1 % CREA dental cream 3 (three) times daily. as directed 07/19/17   [provider]    Family History Family History  Problem Relation Age of Onset  . Allergies Daughter   . Breast cancer Mother   . Diabetes Mother   . Prostate cancer Father   . Heart attack Maternal Grandmother   . Colon cancer Neg Hx   . Esophageal cancer Neg Hx   . Rectal cancer Neg Hx   . Stomach cancer Neg Hx   . Colon polyps Neg Hx     Social History Social History   Tobacco Use  . Smoking status: Former Smoker    Packs/day: 0.50    Years: 5.00    Pack years: 2.50    Types: Cigarettes    Last attempt to quit: 10/23/1982    Years since quitting: 35.1  . Smokeless tobacco: Never Used  Substance Use Topics  . Alcohol use: No  . Drug use: No     Allergies   Codeine and Sulfa antibiotics   Review of Systems Review of Systems ROS: Statement: All systems negative except as marked or noted in the HPI; Constitutional: Negative for fever and chills. ; ; Eyes:  Negative for eye pain, redness and discharge. ; ; ENMT: Negative for ear pain, hoarseness, nasal congestion, sinus pressure and sore throat. ; ; Cardiovascular: Negative for chest pain, palpitations, diaphoresis, dyspnea and peripheral edema. ; ; Respiratory: Negative for cough, wheezing and stridor. ; ; Gastrointestinal: +N/V, abd pain. Negative for diarrhea, blood in stool, hematemesis, jaundice and rectal bleeding. . ; ; Genitourinary: Negative for dysuria, flank pain, and hematuria. ; ; Musculoskeletal: Negative for back pain and neck pain. Negative  for swelling and trauma.; ; Skin: Negative for pruritus, rash, abrasions, blisters, bruising and skin lesion.; ; Neuro: Negative for headache, lightheadedness and neck stiffness. Negative for weakness, altered level of consciousness, altered mental status, extremity weakness, paresthesias, involuntary movement, seizure and syncope.       Physical Exam Updated Vital Signs BP (!) 136/59   Pulse 80   Temp 98.6 F (37 C) (Oral)   Resp 16   Ht 5\' 5"  (1.651 m)   Wt 77.1 kg (170 lb)   SpO2 97%   BMI 28.29 kg/m   Physical Exam 1025: Physical examination:  Nursing notes reviewed; Vital signs and O2 SAT reviewed;  Constitutional: Well developed, Well nourished, Well hydrated, Uncomfortable appearing.; Head:  Normocephalic, atraumatic; Eyes: EOMI, PERRL, No scleral icterus; ENMT: Mouth and pharynx normal, Mucous membranes moist; Neck: Supple, Full range of motion, No lymphadenopathy; Cardiovascular: Regular rate and rhythm, No gallop; Respiratory: Breath sounds clear & equal bilaterally, No wheezes.  Speaking full sentences with ease, Normal respiratory effort/excursion; Chest: Nontender, Movement normal; Abdomen: Soft, +LLQ tenderness to palp. No rebound or guarding. Nondistended, Normal bowel sounds; Genitourinary: No CVA tenderness; Spine:  No midline CS, TS, LS tenderness.;; Extremities: Pulses normal, No tenderness, No edema, No calf edema or  asymmetry.; Neuro: AA&Ox3, Major CN grossly intact.  Speech clear. No gross focal motor or sensory deficits in extremities.; Skin: Color normal, Warm, Dry.   ED Treatments / Results  Labs (all labs ordered are listed, but only abnormal results are displayed)   EKG  EKG Interpretation None       Radiology   Procedures Procedures (including critical care time)  Medications Ordered in ED Medications  oxyCODONE-acetaminophen (PERCOCET/ROXICET) 5-325 MG per tablet 2 tablet (not administered)  ondansetron (ZOFRAN-ODT) disintegrating tablet 8 mg (8 mg Oral Given 12/03/17 1045)  morphine 4 MG/ML injection 4 mg (4 mg Intravenous Given 12/03/17 1045)     Initial Impression / Assessment and Plan / ED Course  I have reviewed the triage vital signs and the nursing notes.  Pertinent labs & imaging results that were available during my care of the patient were reviewed by me and considered in my medical decision making (see chart for details).  MDM Reviewed: previous chart, nursing note and vitals Reviewed previous: labs Interpretation: labs and CT scan   Results for orders placed or performed during the hospital encounter of 12/03/17  CBC with Differential  Result Value Ref Range   WBC 11.9 (H) 4.0 - 10.5 K/uL   RBC 5.06 3.87 - 5.11 MIL/uL   Hemoglobin 13.2 12.0 - 15.0 g/dL   HCT 41.0 36.0 - 46.0 %   MCV 81.0 78.0 - 100.0 fL   MCH 26.1 26.0 - 34.0 pg   MCHC 32.2 30.0 - 36.0 g/dL   RDW 12.4 11.5 - 15.5 %   Platelets 229 150 - 400 K/uL   Neutrophils Relative % 80 %   Neutro Abs 9.6 (H) 1.7 - 7.7 K/uL   Lymphocytes Relative 13 %   Lymphs Abs 1.5 0.7 - 4.0 K/uL   Monocytes Relative 6 %   Monocytes Absolute 0.7 0.1 - 1.0 K/uL   Eosinophils Relative 1 %   Eosinophils Absolute 0.1 0.0 - 0.7 K/uL   Basophils Relative 0 %   Basophils Absolute 0.0 0.0 - 0.1 K/uL  Comprehensive metabolic panel  Result Value Ref Range   Sodium 137 135 - 145 mmol/L   Potassium 4.5 3.5 - 5.1 mmol/L    Chloride 98 (L)  101 - 111 mmol/L   CO2 28 22 - 32 mmol/L   Glucose, Bld 261 (H) 65 - 99 mg/dL   BUN 16 6 - 20 mg/dL   Creatinine, Ser 0.60 0.44 - 1.00 mg/dL   Calcium 9.5 8.9 - 10.3 mg/dL   Total Protein 7.2 6.5 - 8.1 g/dL   Albumin 4.0 3.5 - 5.0 g/dL   AST 18 15 - 41 U/L   ALT 15 14 - 54 U/L   Alkaline Phosphatase 72 38 - 126 U/L   Total Bilirubin 0.9 0.3 - 1.2 mg/dL   GFR calc non Af Amer >60 >60 mL/min   GFR calc Af Amer >60 >60 mL/min   Anion gap 11 5 - 15  Urinalysis, Routine w reflex microscopic  Result Value Ref Range   Color, Urine YELLOW YELLOW   APPearance HAZY (A) CLEAR   Specific Gravity, Urine 1.019 1.005 - 1.030   pH 7.0 5.0 - 8.0   Glucose, UA NEGATIVE NEGATIVE mg/dL   Hgb urine dipstick MODERATE (A) NEGATIVE   Bilirubin Urine NEGATIVE NEGATIVE   Ketones, ur NEGATIVE NEGATIVE mg/dL   Protein, ur NEGATIVE NEGATIVE mg/dL   Nitrite NEGATIVE NEGATIVE   Leukocytes, UA NEGATIVE NEGATIVE   RBC / HPF TOO NUMEROUS TO COUNT 0 - 5 RBC/hpf   WBC, UA 0-5 0 - 5 WBC/hpf   Bacteria, UA NONE SEEN NONE SEEN   Squamous Epithelial / LPF 0-5 (A) NONE SEEN   Mucus PRESENT   Lipase, blood  Result Value Ref Range   Lipase 37 11 - 51 U/L   Ct Renal Stone Study  Result Date: 12/03/2017 CLINICAL DATA:  Acute onset left lower quadrant pain and nausea this morning. History of urinary tract stones. EXAM: CT ABDOMEN AND PELVIS WITHOUT CONTRAST TECHNIQUE: Multidetector CT imaging of the abdomen and pelvis was performed following the standard protocol without IV contrast. COMPARISON:  CT abdomen and pelvis 09/23/2007. FINDINGS: Lower chest: Dependent atelectasis is seen in the lung bases. No pleural or pericardial effusion. Hepatobiliary: No focal liver abnormality is seen. No gallstones, gallbladder wall thickening, or biliary dilatation. Pancreas: Unremarkable. No pancreatic ductal dilatation or surrounding inflammatory changes. Spleen: Normal in size without focal abnormality.  Adrenals/Urinary Tract: Adrenal glands appear normal. There is mild left hydronephrosis due to a 0.4 cm in diameter stone at the ureteropelvic junction. No other urinary tract stones on the right or left. 2.1 cm cyst lower pole right kidney noted. Stomach/Bowel: Stomach is within normal limits. Appendix appears normal. No evidence of bowel wall thickening, distention, or inflammatory changes. Vascular/Lymphatic: Aortic atherosclerosis. No enlarged abdominal or pelvic lymph nodes. Reproductive: Uterus and bilateral adnexa are unremarkable. Other: No ascites. Musculoskeletal: Mild degenerative change about the right hip noted. Otherwise negative. IMPRESSION: Mild left hydronephrosis due to a 0.4 cm stone at the ureteropelvic junction. No other acute abnormality. Atherosclerosis. Electronically Signed   By: Inge Rise M.D.   On: 12/03/2017 12:02    1400:  Pt has tol PO well while in the ED without N/V.  No stooling while in the ED. Pt feels better after pain meds and wants to go home now. Tx symptomatically at this time, f/u Uro MD. Dx and testing d/w pt and family.  Questions answered.  Verb understanding, agreeable to d/c home with outpt f/u.    Final Clinical Impressions(s) / ED Diagnoses   Final diagnoses:  None    ED Discharge Orders    None       Francine Graven,  DO 12/05/17 4540

## 2017-12-03 NOTE — ED Notes (Signed)
Pt tolerated water without any difficulties. Pt denies nausea.

## 2017-12-19 ENCOUNTER — Ambulatory Visit (INDEPENDENT_AMBULATORY_CARE_PROVIDER_SITE_OTHER): Payer: BLUE CROSS/BLUE SHIELD | Admitting: Family Medicine

## 2017-12-19 ENCOUNTER — Encounter: Payer: Self-pay | Admitting: Family Medicine

## 2017-12-19 VITALS — BP 124/72 | Temp 99.4°F | Ht 65.0 in | Wt 188.0 lb

## 2017-12-19 DIAGNOSIS — B9689 Other specified bacterial agents as the cause of diseases classified elsewhere: Secondary | ICD-10-CM | POA: Diagnosis not present

## 2017-12-19 DIAGNOSIS — J019 Acute sinusitis, unspecified: Secondary | ICD-10-CM | POA: Diagnosis not present

## 2017-12-19 MED ORDER — AMOXICILLIN-POT CLAVULANATE 875-125 MG PO TABS
1.0000 | ORAL_TABLET | Freq: Two times a day (BID) | ORAL | 0 refills | Status: DC
Start: 2017-12-19 — End: 2019-05-09

## 2017-12-19 MED ORDER — OXYCODONE-ACETAMINOPHEN 5-325 MG PO TABS: ORAL_TABLET | ORAL | 0 refills | Status: DC

## 2017-12-19 NOTE — Progress Notes (Signed)
   Subjective:    Patient ID: Sarah Pope, female    DOB: 08-21-1956, 62 y.o.   MRN: [id number]  Sinusitis  This is a new problem. Episode onset: 2 days. Associated symptoms include congestion, coughing, ear pain and headaches. Pertinent negatives include no shortness of breath. Treatments tried: otc meds.  Significant head congestion sinus pressure pain discomfort bad sinus headache in addition to this some nausea low-grade fever symptoms over the past few days denies body aches wheezing difficulty breathing  Vital signs were noted  Review of Systems  Constitutional: Negative for activity change and fever.  HENT: Positive for congestion, ear pain and rhinorrhea.   Eyes: Negative for discharge.  Respiratory: Positive for cough. Negative for shortness of breath and wheezing.   Cardiovascular: Negative for chest pain.  Neurological: Positive for headaches.       Objective:   Physical Exam  Constitutional: She appears well-developed.  HENT:  Head: Normocephalic.  Right Ear: External ear normal.  Left Ear: External ear normal.  Nose: Nose normal.  Mouth/Throat: Oropharynx is clear and moist. No oropharyngeal exudate.  Eyes: Right eye exhibits no discharge. Left eye exhibits no discharge.  Neck: Neck supple. No tracheal deviation present.  Cardiovascular: Normal rate and normal heart sounds.  No murmur heard. Pulmonary/Chest: Effort normal and breath sounds normal. She has no wheezes. She has no rales.  Lymphadenopathy:    She has no cervical adenopathy.  Skin: Skin is warm and dry.  Nursing note and vitals reviewed.         Assessment & Plan:  Significant sinus infection recommend antibiotics.  Augmentin twice daily for the next 10 days warning signs discussed.  Percocet for severe pain cautioned drowsiness short-term use only  Drug registry was checked but was unable to give [place] a read out because of the system not corresponding limited number of tablets given Work  excuse for the rest of the week

## 2017-12-27 ENCOUNTER — Other Ambulatory Visit: Payer: Self-pay | Admitting: Family Medicine

## 2018-01-15 ENCOUNTER — Ambulatory Visit (INDEPENDENT_AMBULATORY_CARE_PROVIDER_SITE_OTHER): Payer: Self-pay | Admitting: *Deleted

## 2018-01-15 DIAGNOSIS — I781 Nevus, non-neoplastic: Secondary | ICD-10-CM

## 2018-01-15 NOTE — Progress Notes (Signed)
X=.3% Sotradecol administered with a 27g butterfly.  Patient received a total of 12cc.  Pt presented with numerous clusters of spider veins. Easy access. Tol well. Anticipate good results for this sweet lady. Another appt made for Oct for any additional cleanup.  Photos: Yes.    Compression stockings applied: Yes.

## 2018-01-16 ENCOUNTER — Encounter: Payer: Self-pay | Admitting: *Deleted

## 2018-02-20 ENCOUNTER — Ambulatory Visit: Payer: BLUE CROSS/BLUE SHIELD | Admitting: *Deleted

## 2018-02-22 ENCOUNTER — Other Ambulatory Visit: Payer: Self-pay | Admitting: Nurse Practitioner

## 2018-03-04 ENCOUNTER — Ambulatory Visit (INDEPENDENT_AMBULATORY_CARE_PROVIDER_SITE_OTHER): Payer: BLUE CROSS/BLUE SHIELD | Admitting: Nurse Practitioner

## 2018-03-04 ENCOUNTER — Encounter: Payer: Self-pay | Admitting: Nurse Practitioner

## 2018-03-04 VITALS — BP 122/78 | Temp 98.7°F | Ht 65.0 in | Wt 186.6 lb

## 2018-03-04 DIAGNOSIS — E785 Hyperlipidemia, unspecified: Secondary | ICD-10-CM | POA: Diagnosis not present

## 2018-03-04 DIAGNOSIS — Z794 Long term (current) use of insulin: Secondary | ICD-10-CM

## 2018-03-04 DIAGNOSIS — L02439 Carbuncle of limb, unspecified: Secondary | ICD-10-CM

## 2018-03-04 DIAGNOSIS — E039 Hypothyroidism, unspecified: Secondary | ICD-10-CM

## 2018-03-04 DIAGNOSIS — E119 Type 2 diabetes mellitus without complications: Secondary | ICD-10-CM

## 2018-03-04 DIAGNOSIS — I10 Essential (primary) hypertension: Secondary | ICD-10-CM

## 2018-03-04 DIAGNOSIS — R5383 Other fatigue: Secondary | ICD-10-CM

## 2018-03-04 MED ORDER — DOXYCYCLINE HYCLATE 100 MG PO TABS: 100.0000 mg | ORAL_TABLET | Freq: Two times a day (BID) | ORAL | 0 refills | Status: DC

## 2018-03-04 NOTE — Progress Notes (Signed)
Subjective:  Presents for c/o boils in the left underarm for the past 2 weeks. The first area ruptured with a large amount of brown, foul drainage noted. Left a dark spot. Now several other lesions have appeared. Tender to palpation. No fever. No other rash.   Objective:   BP 122/78   Temp 98.7 F (37.1 C)   Ht 5\' 5"  (1.651 m)   Wt 186 lb 9.6 oz (84.6 kg)   BMI 31.05 kg/m  NAD.  Alert, oriented.  Lungs clear.  Heart regular rate rhythm.  Raised pink lesions with several small pustules noted in the left axillary area coalesced together.  There is a small firm area noted at the site of the lesion that ruptured.  Area is tender to palpation.  Assessment:  Carbuncle of axilla  Essential hypertension  Type 2 diabetes mellitus without complication, with long-term current use of insulin (HCC) - Plan: Hemoglobin A1c  Hypothyroidism, unspecified type - Plan: TSH  Hyperlipidemia LDL goal <100 - Plan: Lipid panel  Fatigue, unspecified type - Plan: VITAMIN D 25 Hydroxy (Vit-D Deficiency, Fractures)    Plan:   Meds ordered this encounter  Medications  . doxycycline (VIBRA-TABS) 100 MG tablet    Sig: Take 1 tablet (100 mg total) by mouth 2 (two) times daily.    Dispense:  20 tablet    Refill:  0    Order Specific Question:   Supervising Provider    Answer:   Mikey Kirschner [[id number]]   Apply warm compresses.  Warning signs reviewed.  Call back by the end of the week if no improvement, sooner if worse.  Also patient is overdue for her routine follow-up on her chronic health issues.  Appropriate lab work ordered.  Patient to schedule appointment.

## 2018-04-01 DIAGNOSIS — Z794 Long term (current) use of insulin: Secondary | ICD-10-CM | POA: Diagnosis not present

## 2018-04-01 DIAGNOSIS — E039 Hypothyroidism, unspecified: Secondary | ICD-10-CM | POA: Diagnosis not present

## 2018-04-01 DIAGNOSIS — E119 Type 2 diabetes mellitus without complications: Secondary | ICD-10-CM | POA: Diagnosis not present

## 2018-04-01 DIAGNOSIS — E785 Hyperlipidemia, unspecified: Secondary | ICD-10-CM | POA: Diagnosis not present

## 2018-04-01 DIAGNOSIS — R5383 Other fatigue: Secondary | ICD-10-CM | POA: Diagnosis not present

## 2018-04-02 LAB — LIPID PANEL
CHOL/HDL RATIO: 4 ratio (ref 0.0–4.4)
Cholesterol, Total: 223 mg/dL — ABNORMAL HIGH (ref 100–199)
HDL: 56 mg/dL (ref >39)
LDL Calculated: 144 mg/dL — ABNORMAL HIGH (ref 0–99)
TRIGLYCERIDES: 117 mg/dL (ref 0–149)
VLDL Cholesterol Cal: 23 mg/dL (ref 5–40)

## 2018-04-02 LAB — HEMOGLOBIN A1C
Est. average glucose Bld gHb Est-mCnc: 180 mg/dL
HEMOGLOBIN A1C: 7.9 % — AB (ref 4.8–5.6)

## 2018-04-02 LAB — TSH: TSH: 2.48 u[IU]/mL (ref 0.450–4.500)

## 2018-04-02 LAB — VITAMIN D 25 HYDROXY (VIT D DEFICIENCY, FRACTURES): Vit D, 25-Hydroxy: 34.2 ng/mL (ref 30.0–100.0)

## 2018-04-10 ENCOUNTER — Encounter: Payer: Self-pay | Admitting: Nurse Practitioner

## 2018-04-10 ENCOUNTER — Ambulatory Visit (INDEPENDENT_AMBULATORY_CARE_PROVIDER_SITE_OTHER): Payer: BLUE CROSS/BLUE SHIELD | Admitting: Nurse Practitioner

## 2018-04-10 VITALS — BP 136/88 | Ht 65.0 in | Wt 188.4 lb

## 2018-04-10 DIAGNOSIS — I10 Essential (primary) hypertension: Secondary | ICD-10-CM

## 2018-04-10 DIAGNOSIS — E785 Hyperlipidemia, unspecified: Secondary | ICD-10-CM | POA: Diagnosis not present

## 2018-04-10 DIAGNOSIS — Z794 Long term (current) use of insulin: Secondary | ICD-10-CM

## 2018-04-10 DIAGNOSIS — E119 Type 2 diabetes mellitus without complications: Secondary | ICD-10-CM | POA: Diagnosis not present

## 2018-04-10 DIAGNOSIS — Z79899 Other long term (current) drug therapy: Secondary | ICD-10-CM

## 2018-04-10 DIAGNOSIS — E039 Hypothyroidism, unspecified: Secondary | ICD-10-CM

## 2018-04-10 DIAGNOSIS — F418 Other specified anxiety disorders: Secondary | ICD-10-CM

## 2018-04-10 MED ORDER — OXAZEPAM 10 MG PO CAPS: 10.0000 mg | ORAL_CAPSULE | Freq: Two times a day (BID) | ORAL | 5 refills | Status: DC | PRN

## 2018-04-10 MED ORDER — ROSUVASTATIN CALCIUM 20 MG PO TABS: 20.0000 mg | ORAL_TABLET | Freq: Every day | ORAL | 5 refills | Status: DC

## 2018-04-10 MED ORDER — PHENTERMINE HCL 37.5 MG PO TABS: ORAL_TABLET | ORAL | 2 refills | Status: DC

## 2018-04-10 NOTE — Progress Notes (Signed)
Subjective: Presents for recheck on her diabetes.  Adherent to medication regimen.  Has done very well with her activity and diet but has been under tremendous mental stress lately which she attributes to trouble with her sugar.  Currently on Celexa 40 mg and long-term use of oxazepam 10 mg although she does not take this very often.  Requesting a refill of her phentermine to help with gym starting her weight loss again.  Has taken this without difficulty in the past.Denies CP/ischemic type pain or SOB. No visual changes. No difficulty speaking or swallowing. No numbness or weakness of the face, arms or legs.  Just had an eye exam 2 weeks ago which was normal, no diabetic retinopathy.  Objective:   BP 136/88   Ht 5\' 5"  (1.651 m)   Wt 188 lb 6.4 oz (85.5 kg)   BMI 31.35 kg/m  NAD.  Alert, oriented.  Crying frequently during office visit when talking about family stressors.  Thoughts logical coherent and relevant.  Dressed appropriately.  Making good eye contact.  Lungs clear.  Heart regular rate and rhythm.  Lower extremities no edema. Results for orders placed or performed in visit on 03/04/18  Hemoglobin A1c  Result Value Ref Range   Hgb A1c MFr Bld 7.9 (H) 4.8 - 5.6 %   Est. average glucose Bld gHb Est-mCnc 180 mg/dL  Lipid panel  Result Value Ref Range   Cholesterol, Total 223 (H) 100 - 199 mg/dL   Triglycerides 117 0 - 149 mg/dL   HDL 56 >39 mg/dL   VLDL Cholesterol Cal 23 5 - 40 mg/dL   LDL Calculated 144 (H) 0 - 99 mg/dL   Chol/HDL Ratio 4.0 0.0 - 4.4 ratio  VITAMIN D 25 Hydroxy (Vit-D Deficiency, Fractures)  Result Value Ref Range   Vit D, 25-Hydroxy 34.2 30.0 - 100.0 ng/mL  TSH  Result Value Ref Range   TSH 2.480 0.450 - 4.500 uIU/mL   Reviewed labs with patient during office visit.  Assessment:   Problem List Items Addressed This Visit      Cardiovascular and Mediastinum   HTN (hypertension) - Primary (Chronic)   Relevant Medications   rosuvastatin (CRESTOR) 20 MG  tablet     Endocrine   DM type 2 (diabetes mellitus, type 2) (HCC) (Chronic)   Relevant Medications   rosuvastatin (CRESTOR) 20 MG tablet   Hypothyroidism (Chronic)     Other   Depression with anxiety   Relevant Medications   oxazepam (SERAX) 10 MG capsule   Hyperlipidemia LDL goal <100   Relevant Medications   rosuvastatin (CRESTOR) 20 MG tablet   Other Relevant Orders   Lipid Profile    Other Visit Diagnoses    High risk medication use       Relevant Orders   Hepatic function panel       Plan:   Meds ordered this encounter  Medications  . phentermine (ADIPEX-P) 37.5 MG tablet    Sig: TAKE ONE TABLET BY MOUTH DAILY BEFORE BREAKFAST    Dispense:  30 tablet    Refill:  2    Order Specific Question:   Supervising Provider    Answer:   Mikey Kirschner [[id number]]  . oxazepam (SERAX) 10 MG capsule    Sig: Take 1 capsule (10 mg total) by mouth 2 (two) times daily as needed.    Dispense:  60 capsule    Refill:  5    [place]; may refill monthly.    Order Specific Question:  Supervising Provider    Answer:   Mikey Kirschner [[id number]]  . rosuvastatin (CRESTOR) 20 MG tablet    Sig: Take 1 tablet (20 mg total) by mouth daily.    Dispense:  30 tablet    Refill:  5    Order Specific Question:   Supervising Provider    Answer:   Maggie Font   Encourage patient to take her oxazepam as directed on a regular basis for anxiety as needed.  Restart phentermine as directed.  Increase Crestor since she is not a goal, states she has been taking her medicine every day.  Also reminded patient about healthy diet activity and weight loss, her A1c has gone up slightly since last time.  Repeat liver and lipid profiles in about 8 weeks. Return in about 4 months (around 08/10/2018) for diabetes check up.  25 minutes was spent with the patient.  This statement verifies that 25 minutes was indeed spent with the patient. Greater than half the time was spent in discussion, counseling and  answering questions  regarding the issues that the patient came in for today as reflected in the diagnosis (s) please refer to documentation for further details.

## 2018-04-10 NOTE — Patient Instructions (Addendum)
Increase Crestor to 20 mg (take 2 of the 10 mg until gone) Repeat labs in about 8 weeks to see if med is working.

## 2018-05-01 ENCOUNTER — Ambulatory Visit (INDEPENDENT_AMBULATORY_CARE_PROVIDER_SITE_OTHER): Payer: BLUE CROSS/BLUE SHIELD | Admitting: Nurse Practitioner

## 2018-05-01 ENCOUNTER — Encounter: Payer: Self-pay | Admitting: Nurse Practitioner

## 2018-05-01 VITALS — BP 126/82 | Temp 98.8°F | Ht 65.0 in | Wt 193.4 lb

## 2018-05-01 DIAGNOSIS — B9689 Other specified bacterial agents as the cause of diseases classified elsewhere: Secondary | ICD-10-CM | POA: Diagnosis not present

## 2018-05-01 DIAGNOSIS — J069 Acute upper respiratory infection, unspecified: Secondary | ICD-10-CM | POA: Diagnosis not present

## 2018-05-01 DIAGNOSIS — N200 Calculus of kidney: Secondary | ICD-10-CM

## 2018-05-01 DIAGNOSIS — J209 Acute bronchitis, unspecified: Secondary | ICD-10-CM

## 2018-05-01 LAB — POCT URINALYSIS DIPSTICK
SPEC GRAV UA: 1.015 (ref 1.010–1.025)
pH, UA: 6 (ref 5.0–8.0)

## 2018-05-01 MED ORDER — ONDANSETRON 4 MG PO TBDP: 4.0000 mg | ORAL_TABLET | Freq: Three times a day (TID) | ORAL | 0 refills | Status: DC | PRN

## 2018-05-01 MED ORDER — OXYCODONE-ACETAMINOPHEN 5-325 MG PO TABS: ORAL_TABLET | ORAL | 0 refills | Status: DC

## 2018-05-01 MED ORDER — LEVOFLOXACIN 500 MG PO TABS: 500.0000 mg | ORAL_TABLET | Freq: Every day | ORAL | 0 refills | Status: DC

## 2018-05-01 NOTE — Patient Instructions (Signed)
Kidney Stones Kidney stones (urolithiasis) are solid, rock-like deposits that form inside of the organs that make urine (kidneys). A kidney stone may form in a kidney and move into the bladder, where it can cause intense pain and block the flow of urine. Kidney stones are created when high levels of certain minerals are found in the urine. They are usually passed through urination, but in some cases, medical treatment may be needed to remove them. What are the causes? Kidney stones may be caused by:  A condition in which certain glands produce too much parathyroid hormone (primary hyperparathyroidism), which causes too much calcium buildup in the blood.  Buildup of uric acid crystals in the bladder (hyperuricosuria). Uric acid is a chemical that the body produces when you eat certain foods. It usually exits the body in the urine.  Narrowing (stricture) of one or both of the tubes that drain urine from the kidneys to the bladder (ureters).  A kidney blockage that is present at birth (congenital obstruction).  Past surgery on the kidney or the ureters, such as gastric bypass surgery.  What increases the risk? The following factors make you more likely to develop kidney stones:  Having had a kidney stone in the past.  Having a family history of kidney stones.  Not drinking enough water.  Eating a diet that is high in protein, salt (sodium), or sugar.  Being overweight or obese.  What are the signs or symptoms? Symptoms of a kidney stone may include:  Nausea.  Vomiting.  Blood in the urine (hematuria).  Pain in the side of the abdomen, right below the ribs (flank pain). Pain usually spreads (radiates) to the groin.  Needing to urinate frequently or urgently.  How is this diagnosed? This condition may be diagnosed based on:  Your medical history.  A physical exam.  Blood tests.  Urine tests.  CT scan.  Abdominal X-ray.  A procedure to examine the inside of the  bladder (cystoscopy).  How is this treated? Treatment for kidney stones depends on the size, location, and makeup of the stones. Treatment may involve:  Analyzing your urine before and after you pass the stone through urination.  Being monitored at the hospital until you pass the stone through urination.  Increasing your fluid intake and decreasing the amount of calcium and protein in your diet.  A procedure to break up kidney stones in the bladder using: ? A focused beam of light (laser therapy). ? Shock waves (extracorporeal shock wave lithotripsy).  Surgery to remove kidney stones. This may be needed if you have severe pain or have stones that block your urinary tract.  Follow these instructions at home: Eating and drinking   Drink enough fluid to keep your urine clear or pale yellow. This will help you to pass the kidney stone.  If directed, change your diet. This may include: ? Limiting how much sodium you eat. ? Eating more fruits and vegetables. ? Limiting how much meat, poultry, fish, and eggs you eat.  Follow instructions from your health care provider about eating or drinking restrictions. General instructions  Collect urine samples as told by your health care provider. You may need to collect a urine sample: ? 24 hours after you pass the stone. ? 8-12 weeks after passing the kidney stone, and every 6-12 months after that.  Strain your urine every time you urinate, for as long as directed. Use the strainer that your health care provider recommends.  Do not throw out   the kidney stone after passing it. Keep the stone so it can be tested by your health care provider. Testing the makeup of your kidney stone may help prevent you from getting kidney stones in the future.  Take over-the-counter and prescription medicines only as told by your health care provider.  Keep all follow-up visits as told by your health care provider. This is important. You may need follow-up  X-rays or ultrasounds to make sure that your stone has passed. How is this prevented? To prevent another kidney stone:  Drink enough fluid to keep your urine clear or pale yellow. This is the best way to prevent kidney stones.  Eat a healthy diet and follow recommendations from your health care provider about foods to avoid. You may be instructed to eat a low-protein diet. Recommendations vary depending on the type of kidney stone that you have.  Maintain a healthy weight.  Contact a health care provider if:  You have pain that gets worse or does not get better with medicine. Get help right away if:  You have a fever or chills.  You develop severe pain.  You develop new abdominal pain.  You faint.  You are unable to urinate. This information is not intended to replace advice given to you by your health care provider. Make sure you discuss any questions you have with your health care provider. Document Released: 10/09/2005 Document Revised: 04/28/2016 Document Reviewed: 03/24/2016 Elsevier Interactive Patient Education  2018 [employer]    Dietary Guidelines to Help Prevent Kidney Stones Kidney stones are deposits of minerals and salts that form inside your kidneys. Your risk of developing kidney stones may be greater depending on your diet, your lifestyle, the medicines you take, and whether you have certain medical conditions. Most people can reduce their chances of developing kidney stones by following the instructions below. Depending on your overall health and the type of kidney stones you tend to develop, your dietitian may give you more specific instructions. What are tips for following this plan? Reading food labels  Choose foods with "no salt added" or "low-salt" labels. Limit your sodium intake to less than 1500 mg per day.  Choose foods with calcium for each meal and snack. Try to eat about 300 mg of calcium at each meal. Foods that contain 200-500 mg of calcium per  serving include: ? 8 oz (237 ml) of milk, fortified nondairy milk, and fortified fruit juice. ? 8 oz (237 ml) of kefir, yogurt, and soy yogurt. ? 4 oz (118 ml) of tofu. ? 1 oz of cheese. ? 1 cup (300 g) of dried figs. ? 1 cup (91 g) of cooked broccoli. ? 1-3 oz can of sardines or mackerel.  Most people need 1000 to 1500 mg of calcium each day. Talk to your dietitian about how much calcium is recommended for you. Shopping  Buy plenty of fresh fruits and vegetables. Most people do not need to avoid fruits and vegetables, even if they contain nutrients that may contribute to kidney stones.  When shopping for convenience foods, choose: ? Whole pieces of fruit. ? Premade salads with dressing on the side. ? Low-fat fruit and yogurt smoothies.  Avoid buying frozen meals or prepared deli foods.  Look for foods with live cultures, such as yogurt and kefir. Cooking  Do not add salt to food when cooking. Place a salt shaker on the table and allow each person to add his or her own salt to taste.  Use vegetable protein,   such as beans, textured vegetable protein (TVP), or tofu instead of meat in pasta, casseroles, and soups. Meal planning  Eat less salt, if told by your dietitian. To do this: ? Avoid eating processed or premade food. ? Avoid eating fast food.  Eat less animal protein, including cheese, meat, poultry, or fish, if told by your dietitian. To do this: ? Limit the number of times you have meat, poultry, fish, or cheese each week. Eat a diet free of meat at least 2 days a week. ? Eat only one serving each day of meat, poultry, fish, or seafood. ? When you prepare animal protein, cut pieces into small portion sizes. For most meat and fish, one serving is about the size of one deck of cards.  Eat at least 5 servings of fresh fruits and vegetables each day. To do this: ? Keep fruits and vegetables on hand for snacks. ? Eat 1 piece of fruit or a handful of berries with  breakfast. ? Have a salad and fruit at lunch. ? Have two kinds of vegetables at dinner.  Limit foods that are high in a substance called oxalate. These include: ? Spinach. ? Rhubarb. ? Beets. ? Potato chips and french fries. ? Nuts.  If you regularly take a diuretic medicine, make sure to eat at least 1-2 fruits or vegetables high in potassium each day. These include: ? Avocado. ? Banana. ? Orange, prune, carrot, or tomato juice. ? Baked potato. ? Cabbage. ? Beans and split peas. General instructions  Drink enough fluid to keep your urine clear or pale yellow. This is the most important thing you can do.  Talk to your health care provider and dietitian about taking daily supplements. Depending on your health and the cause of your kidney stones, you may be advised: ? Not to take supplements with vitamin C. ? To take a calcium supplement. ? To take a daily probiotic supplement. ? To take other supplements such as magnesium, fish oil, or vitamin B6.  Take all medicines and supplements as told by your health care provider.  Limit alcohol intake to no more than 1 drink a day for nonpregnant women and 2 drinks a day for men. One drink equals 12 oz of beer, 5 oz of wine, or 1 oz of hard liquor.  Lose weight if told by your health care provider. Work with your dietitian to find strategies and an eating plan that works best for you. What foods are not recommended? Limit your intake of the following foods, or as told by your dietitian. Talk to your dietitian about specific foods you should avoid based on the type of kidney stones and your overall health. Grains Breads. Bagels. Rolls. Baked goods. Salted crackers. Cereal. Pasta. Vegetables Spinach. Rhubarb. Beets. Canned vegetables. Pickles. Olives. Meats and other protein foods Nuts. Nut butters. Large portions of meat, poultry, or fish. Salted or cured meats. Deli meats. Hot dogs. Sausages. Dairy Cheese. Beverages Regular soft  drinks. Regular vegetable juice. Seasonings and other foods Seasoning blends with salt. Salad dressings. Canned soups. Soy sauce. Ketchup. Barbecue sauce. Canned pasta sauce. Casseroles. Pizza. Lasagna. Frozen meals. Potato chips. French fries. Summary  You can reduce your risk of kidney stones by making changes to your diet.  The most important thing you can do is drink enough fluid. You should drink enough fluid to keep your urine clear or pale yellow.  Ask your health care provider or dietitian how much protein from animal sources you should eat each   day, and also how much salt and calcium you should have each day. This information is not intended to replace advice given to you by your health care provider. Make sure you discuss any questions you have with your health care provider. Document Released: 02/03/2011 Document Revised: 09/19/2016 Document Reviewed: 09/19/2016 Elsevier Interactive Patient Education  2018 Elsevier Inc.  

## 2018-05-03 ENCOUNTER — Encounter: Payer: Self-pay | Admitting: Nurse Practitioner

## 2018-05-03 NOTE — Progress Notes (Signed)
Subjective: Presents for complaints of hoarseness and congestion for the past 3 days.  Possible fever last night.  Patient.  Headache.  Sore throat.  Cough is slightly better, worse at nighttime.  Now producing thick yellow mucus.  Slight wheezing at times, has used her albuterol 4 times total since this began.  No ear pain.  Nausea, no vomiting or diarrhea.  Has a recent history of renal stone February.  Is having similar symptoms the same side in the anterior left flank area noted today.  Felt slightly sore on the left mid back last night.  No obvious blood in her urine.  Pain is in the same area that was in before.  Taking fluids well.  No dysuria.  Objective:   BP 126/82   Temp 98.8 F (37.1 C) (Oral)   Ht 5\' 5"  (1.651 m)   Wt 193 lb 6.4 oz (87.7 kg)   BMI 32.18 kg/m  NAD.  Alert, oriented.  TMs clear effusion, no erythema.  Pharynx injected with PND noted.  Neck supple with mild soft anterior adenopathy.  Lungs scattered faint expiratory crackles, no wheezing or tachypnea.  Frequent nonproductive cough.  Heart regular rate and rhythm.  Mild left CVA area tenderness.  Abdomen soft nondistended with tenderness near the left anterior axillary line near the flank area. Results for orders placed or performed in visit on 05/01/18  POCT urinalysis dipstick  Result Value Ref Range   Color, UA     Clarity, UA     Glucose, UA  Negative   Bilirubin, UA     Ketones, UA     Spec Grav, UA 1.015 1.010 - 1.025   Blood, UA moderate    pH, UA 6.0 5.0 - 8.0   Protein, UA  Negative   Urobilinogen, UA  0.2 or 1.0 E.U./dL   Nitrite, UA     Leukocytes, UA  Negative   Appearance     Odor     Urine micro greater than 10 RBCs per hpf.  Assessment:  Renal stone - Plan: POCT urinalysis dipstick  Bacterial upper respiratory infection  Acute bronchitis, unspecified organism    Plan:   Meds ordered this encounter  Medications  . oxyCODONE-acetaminophen (PERCOCET/ROXICET) 5-325 MG tablet    Sig: 1 q4  hours prn severe pain    Dispense:  16 tablet    Refill:  0    Order Specific Question:   Supervising Provider    Answer:   Mikey Kirschner [[id number]]  . ondansetron (ZOFRAN ODT) 4 MG disintegrating tablet    Sig: Take 1 tablet (4 mg total) by mouth every 8 (eight) hours as needed for nausea or vomiting.    Dispense:  20 tablet    Refill:  0    Order Specific Question:   Supervising Provider    Answer:   Mikey Kirschner [[id number]]  . levofloxacin (LEVAQUIN) 500 MG tablet    Sig: Take 1 tablet (500 mg total) by mouth daily.    Dispense:  10 tablet    Refill:  0    Order Specific Question:   Supervising Provider    Answer:   Maggie Font   PMP reviewed. Continue OTC meds as directed for cough and congestion.  Continue increase fluid intake.  Call back in 48 hours if no improvement in urinary symptoms, go to ED sooner if worse.

## 2018-05-06 LAB — HM DIABETES EYE EXAM

## 2018-08-06 ENCOUNTER — Ambulatory Visit (INDEPENDENT_AMBULATORY_CARE_PROVIDER_SITE_OTHER): Payer: BLUE CROSS/BLUE SHIELD | Admitting: *Deleted

## 2018-08-06 ENCOUNTER — Ambulatory Visit: Payer: BLUE CROSS/BLUE SHIELD

## 2018-08-06 DIAGNOSIS — I781 Nevus, non-neoplastic: Secondary | ICD-10-CM

## 2018-08-06 DIAGNOSIS — I8393 Asymptomatic varicose veins of bilateral lower extremities: Secondary | ICD-10-CM

## 2018-08-06 NOTE — Progress Notes (Signed)
X=.3% Sotradecol administered with a 27g butterfly.  Patient received a total of 6cc.  Pt only could afford one syringe. Got the most troublesome areas. She will return at the end of Nov. For another syringe. Easy access. Tol well.  Photos: Yes.    Compression stockings applied: Yes.

## 2018-08-07 ENCOUNTER — Ambulatory Visit: Payer: BLUE CROSS/BLUE SHIELD | Admitting: *Deleted

## 2018-08-12 ENCOUNTER — Other Ambulatory Visit: Payer: Self-pay | Admitting: Family Medicine

## 2018-08-12 ENCOUNTER — Telehealth: Payer: Self-pay | Admitting: Family Medicine

## 2018-08-12 MED ORDER — FLUCONAZOLE 150 MG PO TABS: ORAL_TABLET | ORAL | 0 refills | Status: DC

## 2018-08-12 NOTE — Telephone Encounter (Signed)
Pt just finished an antibiotic for a tooth infection and states she has a yeast infection. She is hoping Dr. Richardson Landry would call her in something to [facility], [place], [place]

## 2018-08-12 NOTE — Telephone Encounter (Signed)
Diflucan 150 mg sent to [facility]. Pt contacted and verbalized understanding.

## 2018-08-14 ENCOUNTER — Ambulatory Visit: Payer: BLUE CROSS/BLUE SHIELD | Admitting: Family Medicine

## 2018-08-17 ENCOUNTER — Other Ambulatory Visit: Payer: Self-pay | Admitting: Family Medicine

## 2018-08-19 ENCOUNTER — Other Ambulatory Visit: Payer: Self-pay | Admitting: *Deleted

## 2018-08-19 MED ORDER — LOSARTAN POTASSIUM-HCTZ 50-12.5 MG PO TABS: ORAL_TABLET | ORAL | 2 refills | Status: DC

## 2018-08-19 MED ORDER — LEVOTHYROXINE SODIUM 175 MCG PO TABS: 175.0000 ug | ORAL_TABLET | Freq: Every day | ORAL | 2 refills | Status: DC

## 2018-08-19 MED ORDER — METFORMIN HCL 1000 MG PO TABS: 1000.0000 mg | ORAL_TABLET | Freq: Two times a day (BID) | ORAL | 2 refills | Status: DC

## 2018-08-26 ENCOUNTER — Telehealth: Payer: Self-pay | Admitting: Family Medicine

## 2018-08-26 DIAGNOSIS — I10 Essential (primary) hypertension: Secondary | ICD-10-CM

## 2018-08-26 DIAGNOSIS — E785 Hyperlipidemia, unspecified: Secondary | ICD-10-CM

## 2018-08-26 DIAGNOSIS — E119 Type 2 diabetes mellitus without complications: Secondary | ICD-10-CM

## 2018-08-26 DIAGNOSIS — Z79899 Other long term (current) drug therapy: Secondary | ICD-10-CM

## 2018-08-26 DIAGNOSIS — Z794 Long term (current) use of insulin: Secondary | ICD-10-CM

## 2018-08-26 NOTE — Telephone Encounter (Signed)
Last labs 03/2018:  Lipid, Hgb A1c, Vit D and TSH

## 2018-08-26 NOTE — Telephone Encounter (Signed)
Lip liv m7 A1c urine protein

## 2018-08-26 NOTE — Telephone Encounter (Signed)
Patient would like blood work ordered that she receives every 3 months. advise

## 2018-08-26 NOTE — Telephone Encounter (Signed)
Patient is aware the orders placed.

## 2018-08-27 DIAGNOSIS — Z794 Long term (current) use of insulin: Secondary | ICD-10-CM | POA: Diagnosis not present

## 2018-08-27 DIAGNOSIS — E785 Hyperlipidemia, unspecified: Secondary | ICD-10-CM | POA: Diagnosis not present

## 2018-08-27 DIAGNOSIS — I10 Essential (primary) hypertension: Secondary | ICD-10-CM | POA: Diagnosis not present

## 2018-08-27 DIAGNOSIS — Z79899 Other long term (current) drug therapy: Secondary | ICD-10-CM | POA: Diagnosis not present

## 2018-08-27 DIAGNOSIS — E119 Type 2 diabetes mellitus without complications: Secondary | ICD-10-CM | POA: Diagnosis not present

## 2018-08-28 ENCOUNTER — Encounter: Payer: Self-pay | Admitting: Family Medicine

## 2018-08-28 ENCOUNTER — Ambulatory Visit (INDEPENDENT_AMBULATORY_CARE_PROVIDER_SITE_OTHER): Payer: BLUE CROSS/BLUE SHIELD | Admitting: Family Medicine

## 2018-08-28 VITALS — BP 122/82 | Ht 65.0 in | Wt 194.2 lb

## 2018-08-28 DIAGNOSIS — I10 Essential (primary) hypertension: Secondary | ICD-10-CM | POA: Diagnosis not present

## 2018-08-28 DIAGNOSIS — F418 Other specified anxiety disorders: Secondary | ICD-10-CM | POA: Diagnosis not present

## 2018-08-28 DIAGNOSIS — Z23 Encounter for immunization: Secondary | ICD-10-CM | POA: Diagnosis not present

## 2018-08-28 DIAGNOSIS — M2550 Pain in unspecified joint: Secondary | ICD-10-CM | POA: Diagnosis not present

## 2018-08-28 DIAGNOSIS — G4733 Obstructive sleep apnea (adult) (pediatric): Secondary | ICD-10-CM

## 2018-08-28 DIAGNOSIS — M79671 Pain in right foot: Secondary | ICD-10-CM

## 2018-08-28 LAB — LIPID PANEL
CHOLESTEROL TOTAL: 140 mg/dL (ref 100–199)
Chol/HDL Ratio: 2.9 ratio (ref 0.0–4.4)
HDL: 49 mg/dL (ref >39)
LDL CALC: 70 mg/dL (ref 0–99)
TRIGLYCERIDES: 107 mg/dL (ref 0–149)
VLDL CHOLESTEROL CAL: 21 mg/dL (ref 5–40)

## 2018-08-28 LAB — BASIC METABOLIC PANEL
BUN/Creatinine Ratio: 20 (ref 12–28)
BUN: 13 mg/dL (ref 8–27)
CHLORIDE: 97 mmol/L (ref 96–106)
CO2: 26 mmol/L (ref 20–29)
Calcium: 9.8 mg/dL (ref 8.7–10.3)
Creatinine, Ser: 0.65 mg/dL (ref 0.57–1.00)
GFR calc non Af Amer: 96 mL/min/{1.73_m2} (ref >59)
GFR, EST AFRICAN AMERICAN: 111 mL/min/{1.73_m2} (ref >59)
Glucose: 240 mg/dL — ABNORMAL HIGH (ref 65–99)
POTASSIUM: 5.1 mmol/L (ref 3.5–5.2)
SODIUM: 138 mmol/L (ref 134–144)

## 2018-08-28 LAB — MICROALBUMIN / CREATININE URINE RATIO
Creatinine, Urine: 106.3 mg/dL
Microalb/Creat Ratio: 12.1 mg/g creat (ref 0.0–30.0)
Microalbumin, Urine: 12.9 ug/mL

## 2018-08-28 LAB — HEPATIC FUNCTION PANEL
ALK PHOS: 90 IU/L (ref 39–117)
ALT: 16 IU/L (ref 0–32)
AST: 17 IU/L (ref 0–40)
Albumin: 4.5 g/dL (ref 3.6–4.8)
Bilirubin Total: 1.2 mg/dL (ref 0.0–1.2)
Bilirubin, Direct: 0.28 mg/dL (ref 0.00–0.40)
Total Protein: 7.1 g/dL (ref 6.0–8.5)

## 2018-08-28 LAB — HEMOGLOBIN A1C
ESTIMATED AVERAGE GLUCOSE: 240 mg/dL
Hgb A1c MFr Bld: 10 % — ABNORMAL HIGH (ref 4.8–5.6)

## 2018-08-28 MED ORDER — INSULIN GLARGINE 100 UNIT/ML ~~LOC~~ SOLN: 60.0000 [IU] | Freq: Every day | SUBCUTANEOUS | 1 refills | Status: DC

## 2018-08-28 MED ORDER — METFORMIN HCL 1000 MG PO TABS: 1000.0000 mg | ORAL_TABLET | Freq: Two times a day (BID) | ORAL | 2 refills | Status: DC

## 2018-08-28 MED ORDER — INSULIN LISPRO 100 UNIT/ML ~~LOC~~ SOLN: SUBCUTANEOUS | 1 refills | Status: DC

## 2018-08-28 MED ORDER — ROSUVASTATIN CALCIUM 20 MG PO TABS: 20.0000 mg | ORAL_TABLET | Freq: Every day | ORAL | 5 refills | Status: DC

## 2018-08-28 NOTE — Progress Notes (Signed)
Subjective:    Patient ID: Sarah Pope, female    DOB: [birth year], 62 y.o.   MRN: [id number]  Diabetes  She presents for her follow-up diabetic visit. She has type 2 diabetes mellitus. Risk factors for coronary artery disease include diabetes mellitus, dyslipidemia, hypertension and post-menopausal. Current diabetic treatment includes insulin injections and oral agent (monotherapy). She is compliant with treatment all of the time. Her weight is stable. She is following a diabetic diet. She has not had a previous visit with a dietitian. She does not see a podiatrist.Eye exam is current.    Patient would like flu shot today Patient reports her toes ares stiff and hurt Discuss recent labs   Results for orders placed or performed in visit on 08/28/18  HM DIABETES EYE EXAM  Result Value Ref Range   HM Diabetic Eye Exam No Retinopathy No Retinopathy   Recent Results (from the past 2160 hour(s))  Lipid panel     Status: None   Collection Time: 08/27/18  8:04 AM  Result Value Ref Range   Cholesterol, Total 140 100 - 199 mg/dL   Triglycerides 107 0 - 149 mg/dL   HDL 49 >39 mg/dL   VLDL Cholesterol Cal 21 5 - 40 mg/dL   LDL Calculated 70 0 - 99 mg/dL   Chol/HDL Ratio 2.9 0.0 - 4.4 ratio    Comment:                                   T. Chol/HDL Ratio                                             Men  Women                               1/2 Avg.Risk  3.4    3.3                                   Avg.Risk  5.0    4.4                                2X Avg.Risk  9.6    7.1                                3X Avg.Risk 23.4   11.0   Hepatic function panel     Status: None (Preliminary result)   Collection Time: 08/27/18  8:04 AM  Result Value Ref Range   Total Protein 7.1 6.0 - 8.5 g/dL   Albumin 4.5 3.6 - 4.8 g/dL   Bilirubin Total 1.2 0.0 - 1.2 mg/dL   Bilirubin, Direct WILL FOLLOW    Alkaline Phosphatase 90 39 - 117 IU/L   AST 17 0 - 40 IU/L   ALT 16 0 - 32 IU/L  Basic metabolic panel      Status: Abnormal   Collection Time: 08/27/18  8:04 AM  Result Value Ref Range   Glucose 240 (H) 65 - 99 mg/dL   BUN 13 8 - 27  mg/dL   Creatinine, Ser 0.65 0.57 - 1.00 mg/dL   GFR calc non Af Amer 96 >59 mL/min/1.73   GFR calc Af Amer 111 >59 mL/min/1.73   BUN/Creatinine Ratio 20 12 - 28   Sodium 138 134 - 144 mmol/L   Potassium 5.1 3.5 - 5.2 mmol/L   Chloride 97 96 - 106 mmol/L   CO2 26 20 - 29 mmol/L   Calcium 9.8 8.7 - 10.3 mg/dL  Hemoglobin A1c     Status: Abnormal   Collection Time: 08/27/18  8:04 AM  Result Value Ref Range   Hgb A1c MFr Bld 10.0 (H) 4.8 - 5.6 %    Comment:          Prediabetes: 5.7 - 6.4          Diabetes: >6.4          Glycemic control for adults with diabetes: <7.0    Est. average glucose Bld gHb Est-mCnc 240 mg/dL  Microalbumin / creatinine urine ratio     Status: None (Preliminary result)   Collection Time: 08/27/18  8:04 AM  Result Value Ref Range   Creatinine, Urine WILL FOLLOW    Microalbumin, Urine WILL FOLLOW    Microalb/Creat Ratio WILL FOLLOW    Patient continues to take lipid medication regularly. No obvious side effects from it. Generally does not miss a dose. Prior blood work results are reviewed with patient. Patient continues to work on fat intake in diet  Patient claims compliance with diabetes medication. No obvious side effects. Reports no substantial low sugar spells. Most numbers are generally in good range when checked fasting. Generally does not miss a dose of medication. Watching diabetic diet closely  Blood pressure medicine and blood pressure levels reviewed today with patient. Compliant with blood pressure medicine. States does not miss a dose. No obvious side effects. Blood pressure generally good when checked elsewhere. Watching salt intake.   Patient notes ongoing compliance with antidepressant medication. No obvious side effects. Reports does not miss a dose. Overall continues to help depression substantially. No thoughts  of homicide or suicide. Would like to maintain medication.   Toes feel stiff in the evening  Pain worse at night.  Worrisome to the patient.  She is afraid she having a serious form of arthritis.     Review of Systems No headache, no major weight loss or weight gain, no chest pain no back pain abdominal pain no change in bowel habits complete ROS otherwise negative     Objective:   Physical Exam Alert and oriented, vitals reviewed and stable, NAD ENT-TM's and ext canals WNL bilat via otoscopic exam Soft palate, tonsils and post pharynx WNL via oropharyngeal exam Neck-symmetric, no masses; thyroid nonpalpable and nontender Pulmonary-no tachypnea or accessory muscle use; Clear without wheezes via auscultation Card--no abnrml murmurs, rhythm reg and rate WNL Carotid pulses symmetric, without bruits Feet pulses intact.  Sensation intact.  No obvious deformity.  Hands no obvious deformity but       Assessment & Plan:  Impression #1 type 2 diabetes.  Control is quite poor.  Discussed at length.  Patient admits to serious noncompliance with her insulin.  Long discussion.  Encouraged to resume Lantus faithfully.  Will use short acting for just her main meal each day.  2.  Depression.  Ongoing need for meds meds discussed and maintain  3.  Bilateral foot pain.  With patient concerned about arthritis.  No obvious neuropathy.  Will x-ray +2 appropriate work discussed  4.  Hyperlipidemia.  This with ongoing need for meds discussed and encouraged  5.  Hypertension.  Clinically stable to maintain same meds  As noted above on all of these multitude of medical challenges  Also flu shot and pneumonia shot today  Greater than 50% of this 40 minute face to face visit was spent in counseling and discussion and coordination of care regarding the above diagnosis/diagnosies We will press on with x-ray of the foot.  Plus appropriate blood work to further ascertain diagnosis there hold off on  major work-up other than this.

## 2018-08-30 LAB — SEDIMENTATION RATE: Sed Rate: 2 mm/hr (ref 0–40)

## 2018-08-30 LAB — RHEUMATOID FACTOR

## 2018-08-30 LAB — ANA: ANA TITER 1: NEGATIVE

## 2018-09-04 ENCOUNTER — Ambulatory Visit (HOSPITAL_COMMUNITY)
Admission: RE | Admit: 2018-09-04 | Discharge: 2018-09-04 | Disposition: A | Discharge status: Home / Self Care | Payer: BLUE CROSS/BLUE SHIELD | Source: Ambulatory Visit | Attending: Family Medicine | Admitting: Family Medicine

## 2018-09-04 DIAGNOSIS — M2550 Pain in unspecified joint: Secondary | ICD-10-CM | POA: Diagnosis not present

## 2018-09-04 DIAGNOSIS — M19071 Primary osteoarthritis, right ankle and foot: Secondary | ICD-10-CM | POA: Insufficient documentation

## 2018-09-16 ENCOUNTER — Other Ambulatory Visit: Payer: Self-pay

## 2018-09-16 NOTE — Telephone Encounter (Signed)
[facility] is sending a request for Magic mouth wash. Swish four times per day for 10 days.

## 2018-09-16 NOTE — Telephone Encounter (Signed)
Ok plus 2 ref 

## 2018-09-17 ENCOUNTER — Ambulatory Visit (INDEPENDENT_AMBULATORY_CARE_PROVIDER_SITE_OTHER): Payer: Self-pay | Admitting: *Deleted

## 2018-09-17 DIAGNOSIS — I781 Nevus, non-neoplastic: Secondary | ICD-10-CM

## 2018-09-17 MED ORDER — MAGIC MOUTHWASH: ORAL | 2 refills | Status: DC

## 2018-09-17 NOTE — Progress Notes (Signed)
X=.3% Sotradecol administered with a 27g butterfly.  Patient received a total of 6cc.  Cleaned up any remaining open vessels from her Oct. tx. She is healing well. Easy access. Tol well. Anticipate good results with a little staining. Follow prn.  Photos: Yes.    Compression stockings applied: Yes.

## 2018-10-18 ENCOUNTER — Other Ambulatory Visit: Payer: Self-pay | Admitting: *Deleted

## 2018-11-27 ENCOUNTER — Ambulatory Visit: Payer: BLUE CROSS/BLUE SHIELD | Admitting: Family Medicine

## 2018-12-10 ENCOUNTER — Other Ambulatory Visit: Payer: Self-pay | Admitting: *Deleted

## 2018-12-11 ENCOUNTER — Telehealth: Payer: Self-pay

## 2018-12-11 MED ORDER — OXAZEPAM 10 MG PO CAPS: 10.0000 mg | ORAL_CAPSULE | Freq: Two times a day (BID) | ORAL | 5 refills | Status: DC | PRN

## 2018-12-11 NOTE — Telephone Encounter (Signed)
[facility] is requesting refills on oxapepam 10 mg one po bid prn # 60 .

## 2018-12-11 NOTE — Telephone Encounter (Signed)
6 mo ok 

## 2019-01-11 ENCOUNTER — Other Ambulatory Visit: Payer: Self-pay | Admitting: Family Medicine

## 2019-02-14 ENCOUNTER — Telehealth: Payer: Self-pay | Admitting: Family Medicine

## 2019-02-14 NOTE — Telephone Encounter (Signed)
Pharmacy requesting refill on Phentermine 37.5 mg tablet. Take one tablet by mouth daily before breakfast. Pt last seen 08/28/2018 for OSA

## 2019-02-17 ENCOUNTER — Ambulatory Visit (INDEPENDENT_AMBULATORY_CARE_PROVIDER_SITE_OTHER): Payer: BLUE CROSS/BLUE SHIELD | Admitting: Family Medicine

## 2019-02-17 ENCOUNTER — Other Ambulatory Visit: Payer: Self-pay

## 2019-02-17 ENCOUNTER — Encounter: Payer: Self-pay | Admitting: Family Medicine

## 2019-02-17 DIAGNOSIS — M1711 Unilateral primary osteoarthritis, right knee: Secondary | ICD-10-CM | POA: Diagnosis not present

## 2019-02-17 MED ORDER — DICLOFENAC SODIUM 75 MG PO TBEC: DELAYED_RELEASE_TABLET | ORAL | 1 refills | Status: DC

## 2019-02-17 MED ORDER — PHENTERMINE HCL 37.5 MG PO TABS: ORAL_TABLET | ORAL | 2 refills | Status: DC

## 2019-02-17 NOTE — Progress Notes (Signed)
   Subjective:    Patient ID: Sarah Pope, female    DOB: [birth year], 62 y.o.   MRN: [id number] Format - phone Audio only Patient present at home Provider present at office Consent for interaction obtained Coronavirus outbreak made virtual visit necessary  Knee Pain   Incident onset: one week. There was no injury mechanism (working long hours ). The pain is present in the right knee. Treatments tried: brace, icy hot patch,   fell yesterday when right knee gave out. Face is bruised.  Virtual Visit via Telephone Note  I connected with Sarah Pope on 02/17/19 at 11:30 AM EDT by telephone and verified that I am speaking with the correct person using two identifiers.   I discussed the limitations, risks, security and privacy concerns of performing an evaluation and management service by telephone and the availability of in person appointments. I also discussed with the patient that there may be a patient responsible charge related to this service. The patient expressed understanding and agreed to proceed.   History of Present Illness:    Observations/Objective:   Assessment and Plan:   Follow Up Instructions:    I discussed the assessment and treatment plan with the patient. The patient was provided an opportunity to ask questions and all were answered. The patient agreed with the plan and demonstrated an understanding of the instructions.   The patient was advised to call back or seek an in-person evaluation if the symptoms worsen or if the condition fails to improve as anticipated.  I provided 18 minutes of non-face-to-face time during this encounter.   Patient has had knee pain off and on for quite some time.  Worse recently.  Now doing a lot of overtime.  A lot more lifting.  A lot more hollowing.  Knee is more prone to hurting and pain sharp discomfort at times with certain movement.  Actually lost her balance secondary to pain.  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  Local measures discussed.  Anti-inflammatory medicine prescribed.

## 2019-02-21 ENCOUNTER — Other Ambulatory Visit: Payer: Self-pay | Admitting: Family Medicine

## 2019-04-17 ENCOUNTER — Other Ambulatory Visit: Payer: Self-pay | Admitting: Family Medicine

## 2019-05-09 ENCOUNTER — Other Ambulatory Visit: Payer: Self-pay

## 2019-05-09 ENCOUNTER — Ambulatory Visit (INDEPENDENT_AMBULATORY_CARE_PROVIDER_SITE_OTHER): Payer: BC Managed Care – PPO | Admitting: Nurse Practitioner

## 2019-05-09 VITALS — BP 122/70 | Temp 97.7°F | Ht 65.0 in | Wt 192.0 lb

## 2019-05-09 DIAGNOSIS — I83813 Varicose veins of bilateral lower extremities with pain: Secondary | ICD-10-CM

## 2019-05-09 DIAGNOSIS — S60511A Abrasion of right hand, initial encounter: Secondary | ICD-10-CM

## 2019-05-09 DIAGNOSIS — E119 Type 2 diabetes mellitus without complications: Secondary | ICD-10-CM | POA: Diagnosis not present

## 2019-05-09 DIAGNOSIS — Z23 Encounter for immunization: Secondary | ICD-10-CM | POA: Diagnosis not present

## 2019-05-09 DIAGNOSIS — E785 Hyperlipidemia, unspecified: Secondary | ICD-10-CM

## 2019-05-09 DIAGNOSIS — I10 Essential (primary) hypertension: Secondary | ICD-10-CM | POA: Diagnosis not present

## 2019-05-09 DIAGNOSIS — Z794 Long term (current) use of insulin: Secondary | ICD-10-CM

## 2019-05-09 DIAGNOSIS — K219 Gastro-esophageal reflux disease without esophagitis: Secondary | ICD-10-CM

## 2019-05-09 DIAGNOSIS — E039 Hypothyroidism, unspecified: Secondary | ICD-10-CM | POA: Diagnosis not present

## 2019-05-09 LAB — POCT GLYCOSYLATED HEMOGLOBIN (HGB A1C): Hemoglobin A1C: 10.6 % — AB (ref 4.0–5.6)

## 2019-05-09 NOTE — Progress Notes (Signed)
Subjective:    Patient ID: Sarah Pope, female    DOB: December 27, 1955, 63 y.o.   MRN: [id number]  Diabetes She presents for her follow-up diabetic visit. She has type 2 diabetes mellitus. Pertinent negatives for hypoglycemia include no speech difficulty. Associated symptoms include fatigue. Pertinent negatives for diabetes include no chest pain and no weakness. Current diabetic treatments: metformin and insulin. She is compliant with treatment all of the time. Eye exam current: has done one this year.   Needs refill on zofran.   Presents for recheck on chronic health issues including diabetes. Had a recent eye exam which was normal. Having some pain in her feet x 6 months mainly on days when she is working and stands on her feet for hours. Uses Lantus every night but rarely uses sliding scale insulin. Does not check her sugars at home. Her job is busy and she finds it difficult to use this. Also needs a new copy of sliding scale. Has also been taking care of her older partner. She considers her stress level as the main factor in her high sugar. Has a scratch on her right hand from her pet rabbit a few days ago. Needs tetanus vaccine.    Review of Systems  Constitutional: Positive for fatigue.  Respiratory: Negative for chest tightness, shortness of breath and wheezing.   Cardiovascular: Negative for chest pain.  Neurological: Negative for facial asymmetry, speech difficulty, weakness and numbness.  Psychiatric/Behavioral: Positive for sleep disturbance.       Objective:   Physical Exam NAD. Alert, oriented. Lungs clear. Heart RRR. Lower extremities: superficial varicose veins noted. No edema. Superficial small abrasion on the lateral right hand. No sign of infection.  Diabetic Foot Exam - Simple   Simple Foot Form Diabetic Foot exam was performed with the following findings: Yes 05/09/2019  9:30 AM  Visual Inspection See comments: Yes Sensation Testing Intact to touch and monofilament  testing bilaterally: Yes Pulse Check See comments: Yes Comments Toes warm with brisk cap refill. Slight hammer toes noted on right foot. No skin breakdown. Strong DP pulses bilat.    Recent Results (from the past 2160 hour(s))  POCT glycosylated hemoglobin (Hb A1C)     Status: Abnormal   Collection Time: 05/09/19  9:01 AM  Result Value Ref Range   Hemoglobin A1C 10.6 (A) 4.0 - 5.6 %   HbA1c POC (<> result, manual entry)     HbA1c, POC (prediabetic range)     HbA1c, POC (controlled diabetic range)            Assessment & Plan:   Problem List Items Addressed This Visit      Cardiovascular and Mediastinum   HTN (hypertension) (Chronic)   Relevant Orders   Comprehensive metabolic panel   Varicose veins of both lower extremities     Digestive   GERD (gastroesophageal reflux disease)     Endocrine   DM type 2 (diabetes mellitus, type 2) (HCC) - Primary (Chronic)   Relevant Orders   Comprehensive metabolic panel   Hemoglobin A1c   Microalbumin / creatinine urine ratio   Hypothyroidism (Chronic)   Relevant Orders   TSH     Other   Hyperlipidemia LDL goal <100   Relevant Orders   Lipid panel    Other Visit Diagnoses    Diabetes mellitus without complication ([place])       Relevant Orders   POCT glycosylated hemoglobin (Hb A1C) (Completed)   Comprehensive metabolic panel   Abrasion of  right hand, initial encounter       Relevant Orders   Tdap vaccine greater than or equal to 7yo IM (Completed)     Given Rx for [employer] and supplies to see if her insurance will cover. Start sliding scale 3 times per day before meals and given a copy of sliding scale to use Humalog insulin for coverage as directed. Tdap today. Call back if any signs of infection. Lengthy discussion about importance of getting her sugars under control and stress reduction.  Labs ordered.  Return in about 3 months (around 08/09/2019) for physical in mid to late October. 25 minutes was spent with  the patient.  This statement verifies that 25 minutes was indeed spent with the patient.  More than 50% of this visit-total duration of the visit-was spent in counseling and coordination of care. The issues that the patient came in for today as reflected in the diagnosis (s) please refer to documentation for further details.

## 2019-05-10 ENCOUNTER — Encounter: Payer: Self-pay | Admitting: Nurse Practitioner

## 2019-05-16 ENCOUNTER — Telehealth: Payer: Self-pay | Admitting: Family Medicine

## 2019-05-16 ENCOUNTER — Other Ambulatory Visit: Payer: BLUE CROSS/BLUE SHIELD

## 2019-05-16 DIAGNOSIS — Z20822 Contact with and (suspected) exposure to covid-19: Secondary | ICD-10-CM

## 2019-05-16 MED ORDER — ONDANSETRON 8 MG PO TBDP: ORAL_TABLET | ORAL | 1 refills | Status: DC

## 2019-05-16 NOTE — Telephone Encounter (Signed)
Please advise. Thank you

## 2019-05-16 NOTE — Telephone Encounter (Signed)
Pt is going today to be tested for COVID 19 she is having bad headache, diarrhea, and throwing up. States she hasnt ate in 2 days and would like something called in for nausea.   [facility] - [place], [place]

## 2019-05-16 NOTE — Telephone Encounter (Signed)
Zofran sent in to pharmacy. Tried to contact patient to go over the COVID instructions. Phone rang and then went busy.

## 2019-05-16 NOTE — Telephone Encounter (Signed)
Zofran, 8 mg, #12, 1 refill, 1 taken 3 times daily as needed nausea  Also please educate patient regarding warnings regarding COVID-see below Also self-isolation Also when to go to ER If patient having trouble to call us back   This patient was spoken to via phone regarding GI illness symptoms.  Please see documentation regarding the symptoms.    The patient was counseled regarding the following.  Possibility exists that patient may have Covid19.  This is a virus that causes severe flulike symptoms.  The majority of individuals have mild illness and are able to recover at home.  There is no antibiotic or treatment for this.  No local testing exists for this.  Patient was educated regarding the warning signs.  Warning signs include trouble breathing, passing out or near syncope, persistent pain or pressure in the chest, new confusion or difficulty to arouse, bluish lips, unable to keep liquids down.  If emergency symptoms are occurring then-if patient feels they are having medical emergency they are to call 911.  Otherwise they need to go to the emergency department.  For milder cases home care is the best approach.  Home care minimizes exposure of the infected patient to others.  It is wise to self isolate. 10 ways to manage respiratory symptoms at home-per CDC guidelines  #1 stay at home-stay home from work and away from other public places.  If having to go out it is important to avoid public transportation ride sharing etc. #2 monitor your symptoms carefully-if symptoms worsen or show signs of emergent issues ER care may be necessary.  If more routine issues may call office for advice. #3 stay rested and stay hydrated. #4 if medical emergencies call 911 and notify dispatch personnel that you may have [phone] #5 cover your cough and sneezes #6 wash your hands often with soap and water for at least 20 seconds or use alcohol based hand sanitizer that contains at least 60% alcohol #7 as much  as possible stay in a specific room at your home and stay away from other people in your home.  If possible please use separate bathroom.  If you have to be around other people in or outside of the home wear a facemask. #8 avoid sharing personal items-such as dishes, towels, bedding, do not drink after each other #9 clean all surfaces that are touched often like counters tabletops doorknobs use household cleaning sprays or wipes according to the label instructions #10 this illness can vary from person to person but most people over several days will gradually improve  Home self isolation guidelines CDC recommendations  Patients with symptoms consistent with Covid 19 should stay in self-isolation until: At least 3 days have passed since recovery-this is defined as no fevers (without medications), improvement in respiratory symptoms, and at least 7 days have passed since the symptoms first appeared.  Additional information available at http://www.wolf.info/ and  also www.C19check.com is a good website that educates when a person should consider going to the ER versus home care.  The patient would just have to put in various information and it will automatically give advice.  Should the patient need further advice from [place] to call.

## 2019-05-19 ENCOUNTER — Telehealth: Payer: Self-pay | Admitting: *Deleted

## 2019-05-19 LAB — NOVEL CORONAVIRUS, NAA: SARS-CoV-2, NAA: DETECTED — AB

## 2019-05-19 NOTE — Telephone Encounter (Signed)
Tried to call pt multiple times today to discuss message from Friday. Unable to reach pt. Note from testing site that the pt was notified it was positive and I let wendy at the health department know also.

## 2019-05-19 NOTE — Telephone Encounter (Signed)
Community testing site called to report positive covid test.

## 2019-05-19 NOTE — Telephone Encounter (Signed)
Tried to call no answer

## 2019-05-20 NOTE — Telephone Encounter (Signed)
Tried to call no answer

## 2019-05-21 NOTE — Telephone Encounter (Signed)
Tried to call no answer

## 2019-05-21 NOTE — Telephone Encounter (Signed)
So noted patient was informed back at the time of testing and self quarantine was given warning symptoms to watch for

## 2019-05-22 NOTE — Telephone Encounter (Signed)
Tried to call no answer

## 2019-05-23 ENCOUNTER — Telehealth: Payer: Self-pay | Admitting: Family Medicine

## 2019-05-23 NOTE — Telephone Encounter (Signed)
Tried to call no answer. Pt has another message where I have been trying to call her every day this week and no answer.

## 2019-05-23 NOTE — Telephone Encounter (Signed)
After sending this message. Pt called in today and left same number. Tried to call her twice today and no answer

## 2019-05-23 NOTE — Telephone Encounter (Signed)
Pt is requesting her test results for COVID be faxed to [phone]. Attention Merry Proud.   Also she would like to know if she can return to work on 8/10.

## 2019-05-23 NOTE — Telephone Encounter (Signed)
Cannot get in touch with pt. Is it ok to close message

## 2019-05-26 NOTE — Telephone Encounter (Signed)
Telephone call no answer 

## 2019-05-29 ENCOUNTER — Encounter: Payer: Self-pay | Admitting: Family Medicine

## 2019-05-29 NOTE — Telephone Encounter (Signed)
That sounds fine - thank you!

## 2019-05-29 NOTE — Telephone Encounter (Signed)
Patient came by office for her work note (patient is past her 10 day quarantine window) Patient states she is feeling better just weak and regaining her strength and wants to return to work 06/02/2019. Work note given to patient.

## 2019-05-30 ENCOUNTER — Other Ambulatory Visit: Payer: Self-pay | Admitting: Family Medicine

## 2019-06-02 ENCOUNTER — Other Ambulatory Visit: Payer: Self-pay | Admitting: Family Medicine

## 2019-06-13 ENCOUNTER — Ambulatory Visit (INDEPENDENT_AMBULATORY_CARE_PROVIDER_SITE_OTHER): Payer: BC Managed Care – PPO | Admitting: Family Medicine

## 2019-06-13 ENCOUNTER — Encounter: Payer: Self-pay | Admitting: Family Medicine

## 2019-06-13 ENCOUNTER — Other Ambulatory Visit: Payer: Self-pay

## 2019-06-13 DIAGNOSIS — F419 Anxiety disorder, unspecified: Secondary | ICD-10-CM

## 2019-06-13 DIAGNOSIS — F439 Reaction to severe stress, unspecified: Secondary | ICD-10-CM | POA: Diagnosis not present

## 2019-06-13 DIAGNOSIS — F329 Major depressive disorder, single episode, unspecified: Secondary | ICD-10-CM

## 2019-06-13 DIAGNOSIS — F32A Depression, unspecified: Secondary | ICD-10-CM

## 2019-06-13 MED ORDER — CITALOPRAM HYDROBROMIDE 40 MG PO TABS: 60.0000 mg | ORAL_TABLET | Freq: Every day | ORAL | 2 refills | Status: DC

## 2019-06-13 NOTE — Progress Notes (Signed)
   Subjective:  Audio only  Patient ID: Sarah Pope, female    DOB: 1956-04-27, 63 y.o.   MRN: [id number]  HPI  Patient states every since she had Covid she has had problems with her memory and mood.  Patient states she has been having significant stress and problems at work during the stress of Covid and they are trying to work her to death at work and she can't do it anymore-she feels she will have a nervous breakdown and needs to be taken out on long term disability before she snaps on someone. Patient states she can't just quit because she needs the income and that is why she needs to be taken out for long term disability because she doesn't feel like she will be able to return but still have income.   Virtual Visit via Video Note  I connected with Sarah Pope on 06/13/19 at  2:00 PM EDT by a video enabled telemedicine application and verified that I am speaking with the correct person using two identifiers.  Location: Patient: home Provider: office   I discussed the limitations of evaluation and management by telemedicine and the availability of in person appointments. The patient expressed understanding and agreed to proceed.  History of Present Illness:    Observations/Objective:   Assessment and Plan:   Follow Up Instructions:    I discussed the assessment and treatment plan with the patient. The patient was provided an opportunity to ask questions and all were answered. The patient agreed with the plan and demonstrated an understanding of the instructions.   The patient was advised to call back or seek an in-person evaluation if the symptoms worsen or if the condition fails to improve as anticipated.  I provided 30 minutes of non-face-to-face time during this encounter.  Patient has an element of longstanding anxiety and depression.  Generally under good control with medication.  In recent months patient is feeling absolutely overwhelmed.  Not suicidal but feels  she is at the edge of a breakdown.  Crying while on the phone.  Notes tremendous stress at work.  They are demanding over time.  Notes her diabetes has gotten out of control with all this  Always feeling anxious.  On the edge of near panic often.  Also feeling down.  Claims compliance with medications  Is been on Celexa 40 mg for some time.  Plus PRN benzodiazepine   Review of Systems No headache, no major weight loss or weight gain, no chest pain no back pain abdominal pain no change in bowel habits complete ROS otherwise negative     Objective:   Physical Exam  Virtual      Assessment & Plan:  Impression worsening general anxiety and worsening depression with tremendous stress.  Substantially impacting on her physical health.  Diabetes is out of control.  Patient feels she is on the edge of a nervous breakdown.  Very long discussion held regarding options.  Clarified no suicidal thoughts.  Increase Celexa to 60 mg daily.  Maintain PRN benzodiazepines.  Work excuse 6 weeks.  Reevaluate in 1 month.  Mental health referral rationale discussed at length  Greater than 50% of this 30 minute none face to face visit was spent in counseling and discussion and coordination of care regarding the above diagnosis/diagnosies

## 2019-06-15 ENCOUNTER — Encounter: Payer: Self-pay | Admitting: Family Medicine

## 2019-06-16 ENCOUNTER — Encounter: Payer: Self-pay | Admitting: Family Medicine

## 2019-06-19 ENCOUNTER — Other Ambulatory Visit: Payer: Self-pay | Admitting: Family Medicine

## 2019-06-25 ENCOUNTER — Telehealth: Payer: Self-pay | Admitting: Family Medicine

## 2019-06-25 DIAGNOSIS — Z029 Encounter for administrative examinations, unspecified: Secondary | ICD-10-CM

## 2019-06-25 NOTE — Telephone Encounter (Signed)
Please advise. Thank you

## 2019-06-25 NOTE — Telephone Encounter (Signed)
Tried to contact patient again. Phone rang 3 times and then went to busy signal

## 2019-06-25 NOTE — Telephone Encounter (Signed)
Tried to call patient. Phone rang 3 times then went to busy signal. Lab orders were placed in Epic in July. Printed labs off so patient could come by and pick them up. Lab orders up front. Will try to contact patient again.

## 2019-06-25 NOTE — Telephone Encounter (Signed)
Pt went to [facility] to get lab work, there are no orders in the system  Please advise & call pt when orders are ready

## 2019-06-25 NOTE — Telephone Encounter (Signed)
Patient came in this morning and dropped off two forms one was disability and the other FMLA. Patient states she needs disability form completed by 07/02/2019 so she can get paid. I explained that I would try but you might wait until her follow up visit on 9/21 she stated cant wait that long. I fill in what I could,please fill in highlighted areas and form is in your yellow folder to be completed.

## 2019-06-26 ENCOUNTER — Telehealth: Payer: Self-pay | Admitting: Family Medicine

## 2019-06-26 NOTE — Telephone Encounter (Signed)
Patient brought in FMLA papers on 9/2 to be completed.I filled in what I could please review and fill in highlighted areas. Please date and sign. In your yellow folder.

## 2019-06-26 NOTE — Telephone Encounter (Signed)
Please advise. Thank you

## 2019-06-27 NOTE — Telephone Encounter (Signed)
Tried to call no answer

## 2019-07-01 ENCOUNTER — Telehealth: Payer: Self-pay | Admitting: Family Medicine

## 2019-07-01 DIAGNOSIS — R251 Tremor, unspecified: Secondary | ICD-10-CM

## 2019-07-01 DIAGNOSIS — E039 Hypothyroidism, unspecified: Secondary | ICD-10-CM | POA: Diagnosis not present

## 2019-07-01 DIAGNOSIS — F329 Major depressive disorder, single episode, unspecified: Secondary | ICD-10-CM | POA: Diagnosis not present

## 2019-07-01 DIAGNOSIS — Z794 Long term (current) use of insulin: Secondary | ICD-10-CM | POA: Diagnosis not present

## 2019-07-01 DIAGNOSIS — E785 Hyperlipidemia, unspecified: Secondary | ICD-10-CM | POA: Diagnosis not present

## 2019-07-01 DIAGNOSIS — I10 Essential (primary) hypertension: Secondary | ICD-10-CM | POA: Diagnosis not present

## 2019-07-01 DIAGNOSIS — E119 Type 2 diabetes mellitus without complications: Secondary | ICD-10-CM | POA: Diagnosis not present

## 2019-07-01 NOTE — Telephone Encounter (Signed)
Also see other message in pt calls for pt

## 2019-07-01 NOTE — Telephone Encounter (Signed)
Referral put in.

## 2019-07-01 NOTE — Telephone Encounter (Signed)
Pt had assessment at [facility] - they suggested that we refer pt to neurology due to shaking/tremors  Please advise & initiate referral in system if OK to refer

## 2019-07-01 NOTE — Telephone Encounter (Signed)
sure

## 2019-07-01 NOTE — Telephone Encounter (Signed)
Tried to call pt no answer. Also see other message in patient calls on pt.

## 2019-07-01 NOTE — Telephone Encounter (Signed)
Tried to call no answer

## 2019-07-02 LAB — COMPREHENSIVE METABOLIC PANEL
ALT: 21 IU/L (ref 0–32)
AST: 18 IU/L (ref 0–40)
Albumin/Globulin Ratio: 2.1 (ref 1.2–2.2)
Albumin: 4.7 g/dL (ref 3.8–4.8)
Alkaline Phosphatase: 88 IU/L (ref 39–117)
BUN/Creatinine Ratio: 29 — ABNORMAL HIGH (ref 12–28)
BUN: 18 mg/dL (ref 8–27)
Bilirubin Total: 1.1 mg/dL (ref 0.0–1.2)
CO2: 25 mmol/L (ref 20–29)
Calcium: 10 mg/dL (ref 8.7–10.3)
Chloride: 95 mmol/L — ABNORMAL LOW (ref 96–106)
Creatinine, Ser: 0.63 mg/dL (ref 0.57–1.00)
GFR calc Af Amer: 111 mL/min/{1.73_m2} (ref >59)
GFR calc non Af Amer: 96 mL/min/{1.73_m2} (ref >59)
Globulin, Total: 2.2 g/dL (ref 1.5–4.5)
Glucose: 214 mg/dL — ABNORMAL HIGH (ref 65–99)
Potassium: 4.8 mmol/L (ref 3.5–5.2)
Sodium: 137 mmol/L (ref 134–144)
Total Protein: 6.9 g/dL (ref 6.0–8.5)

## 2019-07-02 LAB — MICROALBUMIN / CREATININE URINE RATIO
Creatinine, Urine: 130 mg/dL
Microalb/Creat Ratio: 25 mg/g creat (ref 0–29)
Microalbumin, Urine: 32.7 ug/mL

## 2019-07-02 LAB — LIPID PANEL
Chol/HDL Ratio: 2.9 ratio (ref 0.0–4.4)
Cholesterol, Total: 150 mg/dL (ref 100–199)
HDL: 51 mg/dL (ref >39)
LDL Chol Calc (NIH): 80 mg/dL (ref 0–99)
Triglycerides: 107 mg/dL (ref 0–149)
VLDL Cholesterol Cal: 19 mg/dL (ref 5–40)

## 2019-07-02 LAB — HEMOGLOBIN A1C
Est. average glucose Bld gHb Est-mCnc: 283 mg/dL
Hgb A1c MFr Bld: 11.5 % — ABNORMAL HIGH (ref 4.8–5.6)

## 2019-07-02 LAB — TSH: TSH: 1.36 u[IU]/mL (ref 0.450–4.500)

## 2019-07-03 NOTE — Telephone Encounter (Signed)
Upfront for pickup and faxed in.

## 2019-07-03 NOTE — Telephone Encounter (Signed)
Tried to call no answer. See result note also and other patient call

## 2019-07-03 NOTE — Telephone Encounter (Signed)
Tried to call no answer. Pt has two other messages also another one in patient calls and one in result note

## 2019-07-07 ENCOUNTER — Telehealth: Payer: Self-pay | Admitting: Family Medicine

## 2019-07-07 MED ORDER — OXAZEPAM 10 MG PO CAPS: 10.0000 mg | ORAL_CAPSULE | Freq: Two times a day (BID) | ORAL | 5 refills | Status: DC | PRN

## 2019-07-07 NOTE — Telephone Encounter (Signed)
Telephone call no answer 

## 2019-07-07 NOTE — Telephone Encounter (Signed)
Referral ordered as requested- unable to reach patient by phone to notify

## 2019-07-07 NOTE — Telephone Encounter (Signed)
Prescription sent electronically to pharmacy. Telephone call no answer to notify patient.

## 2019-07-07 NOTE — Telephone Encounter (Signed)
Keep trying to notify

## 2019-07-07 NOTE — Telephone Encounter (Signed)
Pt needs refill for oxazepam (SERAX) 10 MG capsule  Pt is out of this medicine for a week, pharmacy says they've requested refill from [place], didn't see anything in the chart  Please advise & call pt when done      [facility]

## 2019-07-07 NOTE — Telephone Encounter (Signed)
Ok plus 5 monthly ref 

## 2019-07-08 NOTE — Telephone Encounter (Signed)
Patient form are complete and faxed in

## 2019-07-09 NOTE — Telephone Encounter (Signed)
Patient picked up her medication per pharmacy

## 2019-07-11 ENCOUNTER — Encounter: Payer: Self-pay | Admitting: Family Medicine

## 2019-07-14 ENCOUNTER — Encounter: Payer: Self-pay | Admitting: Family Medicine

## 2019-07-14 ENCOUNTER — Ambulatory Visit (INDEPENDENT_AMBULATORY_CARE_PROVIDER_SITE_OTHER): Payer: BC Managed Care – PPO | Admitting: Family Medicine

## 2019-07-14 ENCOUNTER — Other Ambulatory Visit: Payer: Self-pay

## 2019-07-14 VITALS — BP 118/76 | Temp 98.7°F | Ht 65.0 in | Wt 193.6 lb

## 2019-07-14 DIAGNOSIS — Z23 Encounter for immunization: Secondary | ICD-10-CM

## 2019-07-14 DIAGNOSIS — F419 Anxiety disorder, unspecified: Secondary | ICD-10-CM

## 2019-07-14 DIAGNOSIS — F329 Major depressive disorder, single episode, unspecified: Secondary | ICD-10-CM | POA: Diagnosis not present

## 2019-07-14 DIAGNOSIS — F32A Depression, unspecified: Secondary | ICD-10-CM

## 2019-07-14 DIAGNOSIS — E119 Type 2 diabetes mellitus without complications: Secondary | ICD-10-CM | POA: Diagnosis not present

## 2019-07-14 DIAGNOSIS — R251 Tremor, unspecified: Secondary | ICD-10-CM | POA: Diagnosis not present

## 2019-07-14 DIAGNOSIS — I10 Essential (primary) hypertension: Secondary | ICD-10-CM

## 2019-07-14 DIAGNOSIS — F439 Reaction to severe stress, unspecified: Secondary | ICD-10-CM

## 2019-07-14 DIAGNOSIS — Z794 Long term (current) use of insulin: Secondary | ICD-10-CM

## 2019-07-14 MED ORDER — INSULIN GLARGINE 100 UNIT/ML ~~LOC~~ SOLN: 75.0000 [IU] | Freq: Every day | SUBCUTANEOUS | 1 refills | Status: DC

## 2019-07-14 NOTE — Telephone Encounter (Signed)
Pt.notified

## 2019-07-14 NOTE — Progress Notes (Signed)
Subjective:  Patient arrives with numerous complicated concerns  Patient ID: Sarah Pope, female    DOB: Mar 05, 1956, 63 y.o.   MRN: [id number]  Diabetes She presents for her follow-up diabetic visit. She has type 2 diabetes mellitus. She is compliant with treatment all of the time. Exercise: doing a little walking but not as much as she should, will start back at the gym now that they are open. Home blood sugar record trend: 135-150, sometimes higher. Eye exam current: right at one year ago.  A1C done on bloodwork.   Needs form filled out for [employer].   Needs refill on magic mouthwash for sore gums.   Needs refill on zofran 8mg .   Needs refill on diclofenac for knee pain.  States definitely helps her knees.  Results for orders placed or performed in visit on 05/16/19  Novel Coronavirus, NAA ([facility])  Result Value Ref Range   SARS-CoV-2, NAA Detected (A) Not Detected   Pt saw the [facility].   Psych input clinician was concerned about the pt's tremor and recommended a neurologist input.  Unfortunately it appears the psych input clinician wanted the neurologist input before further psychiatric management.  I have asked the patient to work on both.  We will work on the neurology referral.  She needs to continue her progress with the psychiatry group that we set her up with.  Pt notes her shakes and tremors develop when she gets [facility] overwhelmed Patient also notes she is worried about this tremor.  Worried that it can be the first sign of a parkinsonism syndrome.   Due to see [facility] folks again, appt not yet has occurred.  Patient continues to have substantial anxiety and stress.  Periods of feeling down.  Spells of feel almost like panic attacks.  Increasing the Celexa has helped.  However continues to have high anxiety.  Begins to shake more every time she thinks about returning to work..  No suicidal thoughts.  No homicidal thoughts.  Just  incredible anxiety and crying spells and unable to handle work mentally at this time  Patient unfortunately continues to run elevated sugars.  Most morning sugars in the high 100s.  Her A1c returned very elevated unfortunately.  Patient reports suboptimal diet  Results for orders placed or performed in visit on 05/16/19  Novel Coronavirus, NAA ([facility])  Result Value Ref Range   SARS-CoV-2, NAA Detected (A) Not Detected    Recent Results (from the past 2160 hour(s))  POCT glycosylated hemoglobin (Hb A1C)     Status: Abnormal   Collection Time: 05/09/19  9:01 AM  Result Value Ref Range   Hemoglobin A1C 10.6 (A) 4.0 - 5.6 %   HbA1c POC (<> result, manual entry)     HbA1c, POC (prediabetic range)     HbA1c, POC (controlled diabetic range)    Novel Coronavirus, NAA ([facility])     Status: Abnormal   Collection Time: 05/16/19 11:50 AM  Result Value Ref Range   SARS-CoV-2, NAA Detected (A) Not Detected    Comment: Testing was performed using the cobas(R) SARS-CoV-2 test. This test was developed and its performance characteristics determined by [employer]. This test has not been FDA cleared or approved. This test has been authorized by FDA under an Emergency Use Authorization (EUA). This test is only authorized for the duration of time the declaration that circumstances exist justifying the authorization of the emergency use of in vitro diagnostic tests for detection  of SARS-CoV-2 virus and/or diagnosis of COVID-19 infection under section 564(b)(1) of the Act, 21 U.S.C. [id number]), unless the authorization is terminated or revoked sooner. When diagnostic testing is negative, the possibility of a false negative result should be considered in the context of a patient's recent exposures and the presence of clinical signs and symptoms consistent with COVID-19. An individual without symptoms of COVID-19 and who is not shedding SARS-CoV-2 virus would expect to have a negati ve  (not detected) result in this assay.   Comprehensive metabolic panel     Status: Abnormal   Collection Time: 07/01/19 10:35 AM  Result Value Ref Range   Glucose 214 (H) 65 - 99 mg/dL   BUN 18 8 - 27 mg/dL   Creatinine, Ser 0.63 0.57 - 1.00 mg/dL   GFR calc non Af Amer 96 >59 mL/min/1.73   GFR calc Af Amer 111 >59 mL/min/1.73   BUN/Creatinine Ratio 29 (H) 12 - 28   Sodium 137 134 - 144 mmol/L   Potassium 4.8 3.5 - 5.2 mmol/L   Chloride 95 (L) 96 - 106 mmol/L   CO2 25 20 - 29 mmol/L   Calcium 10.0 8.7 - 10.3 mg/dL   Total Protein 6.9 6.0 - 8.5 g/dL   Albumin 4.7 3.8 - 4.8 g/dL   Globulin, Total 2.2 1.5 - 4.5 g/dL   Albumin/Globulin Ratio 2.1 1.2 - 2.2   Bilirubin Total 1.1 0.0 - 1.2 mg/dL   Alkaline Phosphatase 88 39 - 117 IU/L   AST 18 0 - 40 IU/L   ALT 21 0 - 32 IU/L  TSH     Status: None   Collection Time: 07/01/19 10:35 AM  Result Value Ref Range   TSH 1.360 0.450 - 4.500 uIU/mL  Hemoglobin A1c     Status: Abnormal   Collection Time: 07/01/19 10:35 AM  Result Value Ref Range   Hgb A1c MFr Bld 11.5 (H) 4.8 - 5.6 %    Comment:          Prediabetes: 5.7 - 6.4          Diabetes: >6.4          Glycemic control for adults with diabetes: <7.0    Est. average glucose Bld gHb Est-mCnc 283 mg/dL  Lipid panel     Status: None   Collection Time: 07/01/19 10:35 AM  Result Value Ref Range   Cholesterol, Total 150 100 - 199 mg/dL   Triglycerides 107 0 - 149 mg/dL   HDL 51 >39 mg/dL   VLDL Cholesterol Cal 19 5 - 40 mg/dL   LDL Chol Calc (NIH) 80 0 - 99 mg/dL   Chol/HDL Ratio 2.9 0.0 - 4.4 ratio    Comment:                                   T. Chol/HDL Ratio                                             Men  Women                               1/2 Avg.Risk  3.4    3.3  Avg.Risk  5.0    4.4                                2X Avg.Risk  9.6    7.1                                3X Avg.Risk 23.4   11.0   Microalbumin / creatinine urine ratio     Status:  None   Collection Time: 07/01/19 10:35 AM  Result Value Ref Range   Creatinine, Urine 130.0 Not Estab. mg/dL   Microalbumin, Urine 32.7 Not Estab. ug/mL   Microalb/Creat Ratio 25 0 - 29 mg/g creat    Comment:                        Normal:                0 -  29                        Moderately increased: 30 - 300                        Severely increased:       >300               **Please note reference interval change**      Positive substantial ongoing fatigue  Review of Systems No headache, no major weight loss or weight gain, no chest pain no back pain abdominal pain no change in bowel habits complete ROS otherwise negative     Objective:   Physical Exam   Alert and oriented, vitals reviewed and stable, NAD ENT-TM's and ext canals WNL bilat via otoscopic exam Soft palate, tonsils and post pharynx WNL via oropharyngeal exam Neck-symmetric, no masses; thyroid nonpalpable and nontender Pulmonary-no tachypnea or accessory muscle use; Clear without wheezes via auscultation Card--no abnrml murmurs, rhythm reg and rate WNL Carotid pulses symmetric, without bruits Hands reveal tremor.  Probable essential tremor.  No cogwheel rigidity.  Gait completely normal.  No resting tremor     Assessment & Plan:  Impression 1 anxiety fairly severe accompanied by depression and stress.  Patient absolutely needs a psychiatrist to manage this.  This is accentuated to the patient once again  2.  Type 2 diabetes.  Poor control.  Not [id number] certain of the day-to-day compliance with rapid insulin and long-acting insulin.  Increase Lantus to 75 units  3.  Tremor.  Probable essential tremor Long discussion held.  Does get substantially worse during periods of stress.  However patient very worried about potential serious etiology so we will work on a neurology referral  Patient often very anxious and on the border of tears during exam.  Apparently due to follow-up with Hoyle Sauer next month and we will  follow-up in 90 days.  We will work on neurology referral.  We will work on reinforcing her ongoing need for ongoing mental health care.  Numerous questions answered  Flu shot today  Greater than 50% of this 40 minute face to face visit was spent in counseling and discussion and coordination of care regarding the above diagnosis/diagnosies

## 2019-07-14 NOTE — Patient Instructions (Signed)
Please immediately increase your lantus to 75 units if in two weeks the fasting numbers are not below 150 most of the time. plz add five more units at that time. If in one month, the niumbers are still not mostly under 150 add five moore and call us and let us know

## 2019-07-15 DIAGNOSIS — D474 Osteomyelofibrosis: Secondary | ICD-10-CM | POA: Diagnosis not present

## 2019-07-15 DIAGNOSIS — Z79899 Other long term (current) drug therapy: Secondary | ICD-10-CM | POA: Diagnosis not present

## 2019-07-15 DIAGNOSIS — E1169 Type 2 diabetes mellitus with other specified complication: Secondary | ICD-10-CM | POA: Diagnosis not present

## 2019-07-15 DIAGNOSIS — G218 Other secondary parkinsonism: Secondary | ICD-10-CM | POA: Diagnosis not present

## 2019-07-15 DIAGNOSIS — G3184 Mild cognitive impairment, so stated: Secondary | ICD-10-CM | POA: Diagnosis not present

## 2019-07-15 DIAGNOSIS — G4733 Obstructive sleep apnea (adult) (pediatric): Secondary | ICD-10-CM | POA: Diagnosis not present

## 2019-07-18 DIAGNOSIS — Z029 Encounter for administrative examinations, unspecified: Secondary | ICD-10-CM

## 2019-08-14 ENCOUNTER — Other Ambulatory Visit: Payer: Self-pay | Admitting: Family Medicine

## 2019-08-15 ENCOUNTER — Other Ambulatory Visit: Payer: Self-pay

## 2019-08-15 ENCOUNTER — Ambulatory Visit (INDEPENDENT_AMBULATORY_CARE_PROVIDER_SITE_OTHER): Payer: BC Managed Care – PPO | Admitting: Nurse Practitioner

## 2019-08-15 ENCOUNTER — Encounter: Payer: Self-pay | Admitting: Nurse Practitioner

## 2019-08-15 VITALS — BP 126/82 | Temp 97.5°F | Ht 65.0 in | Wt 200.2 lb

## 2019-08-15 DIAGNOSIS — Z1231 Encounter for screening mammogram for malignant neoplasm of breast: Secondary | ICD-10-CM | POA: Diagnosis not present

## 2019-08-15 DIAGNOSIS — Z0000 Encounter for general adult medical examination without abnormal findings: Secondary | ICD-10-CM

## 2019-08-15 DIAGNOSIS — E119 Type 2 diabetes mellitus without complications: Secondary | ICD-10-CM

## 2019-08-15 DIAGNOSIS — Z124 Encounter for screening for malignant neoplasm of cervix: Secondary | ICD-10-CM | POA: Diagnosis not present

## 2019-08-15 DIAGNOSIS — Z794 Long term (current) use of insulin: Secondary | ICD-10-CM

## 2019-08-15 DIAGNOSIS — Z01419 Encounter for gynecological examination (general) (routine) without abnormal findings: Secondary | ICD-10-CM

## 2019-08-15 MED ORDER — PANTOPRAZOLE SODIUM 40 MG PO TBEC: 40.0000 mg | DELAYED_RELEASE_TABLET | Freq: Every day | ORAL | 5 refills | Status: DC

## 2019-08-15 MED ORDER — CITALOPRAM HYDROBROMIDE 40 MG PO TABS: 40.0000 mg | ORAL_TABLET | Freq: Every day | ORAL | 5 refills | Status: DC

## 2019-08-15 MED ORDER — LOSARTAN POTASSIUM 50 MG PO TABS: 50.0000 mg | ORAL_TABLET | Freq: Every day | ORAL | 5 refills | Status: DC

## 2019-08-15 MED ORDER — HYDROCHLOROTHIAZIDE 12.5 MG PO TABS: 12.5000 mg | ORAL_TABLET | Freq: Every day | ORAL | 5 refills | Status: DC

## 2019-08-15 MED ORDER — LEVOTHYROXINE SODIUM 175 MCG PO TABS: 175.0000 ug | ORAL_TABLET | Freq: Every day | ORAL | 5 refills | Status: DC

## 2019-08-15 MED ORDER — ROSUVASTATIN CALCIUM 20 MG PO TABS: 20.0000 mg | ORAL_TABLET | Freq: Every day | ORAL | 5 refills | Status: DC

## 2019-08-15 MED ORDER — ALBUTEROL SULFATE HFA 108 (90 BASE) MCG/ACT IN AERS: 2.0000 | INHALATION_SPRAY | RESPIRATORY_TRACT | Status: DC | PRN

## 2019-08-15 MED ORDER — METFORMIN HCL 1000 MG PO TABS: 1000.0000 mg | ORAL_TABLET | Freq: Two times a day (BID) | ORAL | 5 refills | Status: DC

## 2019-08-15 MED ORDER — DICLOFENAC SODIUM 75 MG PO TBEC: DELAYED_RELEASE_TABLET | ORAL | 2 refills | Status: DC

## 2019-08-15 MED ORDER — INSULIN GLARGINE 100 UNIT/ML ~~LOC~~ SOLN: 80.0000 [IU] | Freq: Every day | SUBCUTANEOUS | 5 refills | Status: DC

## 2019-08-15 NOTE — Progress Notes (Signed)
Subjective:    Patient ID: Sarah Pope, female    DOB: [birth year], 63 y.o.   MRN: [id number]  HPI  The patient comes in today for a wellness visit. Patient does not have any specific complaints. Pt has recently starting using FreeStyle Libre to monitor glucose levels. Pt having good fasting and preprandial readings. Taking 80 units of Lantus at bedtime. Pt has had some low sugars 50-60s. Pt skipped her nighttime metformin and she hasn't had a hypoglycemic event in 1 week. Pt drinks orange juice if she becomes hypoglycemic. Pt reports being overall healthier. Has started going to the gym and cut out sodas from her diet. She reports feeling overall better and less stressed. Sleeps 8 hours per day. Regular dental exams; pt will schedule eye exam soon. Pt would like breast mammogram this year. Pt has received flu vaccine and Pneumovax 23.   A review of their health history was completed.  A review of medications was also completed.  Any needed refills; yes to [facility]  Falls/  MVA accidents in past few months: none  Specialist pt sees on regular basis: neurologist for tremors and memory issues Podiatrist  Preventative health issues were discussed.   Drug/Alcohol: Denies any tobacco or alcohol use.  Additional concerns: discuss ongoing tremors-seeing neurology  Depression screen [facility] 2/9 08/15/2019 08/28/2018 10/04/2016  Decreased Interest 1 0 0  Down, Depressed, Hopeless 1 0 0  PHQ - 2 Score 2 0 0  Altered sleeping 1 0 -  Tired, decreased energy 1 3 -  Change in appetite 0 0 -  Feeling bad or failure about yourself  0 0 -  Trouble concentrating 0 0 -  Moving slowly or fidgety/restless 0 0 -  Suicidal thoughts 0 0 -  PHQ-9 Score 4 3 -  Difficult doing work/chores Not difficult at all Somewhat difficult -      Review of Systems  Constitutional: Negative for activity change, appetite change, chills and fever.  HENT: Negative for ear pain, sinus pain and sneezing.    Respiratory: Negative for cough, shortness of breath and wheezing.   Cardiovascular: Negative for chest pain, palpitations and leg swelling.  Gastrointestinal: Negative for abdominal pain, blood in stool, constipation, diarrhea and vomiting.  Genitourinary: Negative for difficulty urinating, dysuria, frequency, genital sores, pelvic pain, vaginal bleeding and vaginal discharge.  Musculoskeletal: Negative for back pain and myalgias.  Neurological: Negative for dizziness, syncope, weakness and light-headedness.  Psychiatric/Behavioral: Negative for confusion and sleep disturbance.       Objective:   Physical Exam Exam conducted with a chaperone present.  Constitutional:      Appearance: Normal appearance.  Neck:     Thyroid: No thyroid mass or thyromegaly.  Cardiovascular:     Rate and Rhythm: Normal rate and regular rhythm.     Heart sounds: No murmur.  Pulmonary:     Effort: No respiratory distress.     Breath sounds: No stridor. Wheezing present. No rhonchi.     Comments: Slight expiratory wheezing on the anterior lung fields Chest:     Chest wall: No mass, lacerations, swelling or tenderness.     Breasts:        Right: Normal. No swelling, bleeding, inverted nipple or mass.        Left: Normal. No swelling, bleeding, inverted nipple or mass.  Abdominal:     General: Abdomen is flat.     Palpations: There is no mass.     Tenderness: There is no  abdominal tenderness. There is no rebound.     Hernia: No hernia is present.  Genitourinary:    Exam position: Lithotomy position.     Comments: External: No rashes or lesion present. Vagina pink Internal: Cervical os pink. No discharge. No CMT. No tenderness or masses during bimanual exam. Exam limited due to abdominal girth. Feet:     Comments: Left foot: Erythematous rash on medical aspect of the dorsal side. Rash with well-defined edges. Right foot: Generalized erythematous rash of lateral aspect of dorsal side. Edges not defined.  Lymphadenopathy:     Upper Body:     Right upper body: No supraclavicular, axillary or pectoral adenopathy.     Left upper body: No supraclavicular, axillary or pectoral adenopathy.  Skin:    General: Skin is warm and dry.  Neurological:     Mental Status: She is alert and oriented to person, place, and time.  Psychiatric:        Mood and Affect: Mood normal.        Behavior: Behavior normal.           Assessment & Plan:   Problem List Items Addressed This Visit      Endocrine   DM type 2 (diabetes mellitus, type 2) ([place]) (Chronic)    Other Visit Diagnoses    Well woman exam    -  Primary   Screening for cervical cancer       Relevant Orders   Pap IG and HPV (high risk) DNA detection   Screening mammogram, encounter for       Relevant Orders   MM DIGITAL SCREENING BILATERAL     -Recommended continued dietary and lifestyle modifications. Reviewed and discussed sign/symptoms of hypoglycemia. Instructed pt to keep lifesavers or orange juice in case of a hypoglycemic event. If having issues with hypoglycemia, recommended pt decrease Lantus by 5 units. Mammogram referral ordered. Instructed pt to see if insurance will cover Shingrix at local pharmacy. Pt will follow-up with podiatry about rash on her foot.  Return in about 2 months (around 10/15/2019) for diabetes.

## 2019-08-15 NOTE — Progress Notes (Signed)
   Subjective:    Patient ID: Sarah Pope, female    DOB: 09-Jun-1956, 63 y.o.   MRN: [id number]  HPI  The patient comes in today for a wellness visit.    A review of their health history was completed.  A review of medications was also completed.  Any needed refills; yes to [facility]  Eating habits: eating better  Falls/  MVA accidents in past few months: none  Regular exercise: started working out at gym  Specialist pt sees on regular basis: neurologist for tremors and memory issues Podiatrist  Preventative health issues were discussed.   Additional concerns: discuss ongoing tremors-seeing neurology   Review of Systems     Objective:   Physical Exam        Assessment & Plan:

## 2019-08-20 DIAGNOSIS — M79672 Pain in left foot: Secondary | ICD-10-CM | POA: Diagnosis not present

## 2019-08-20 DIAGNOSIS — E1140 Type 2 diabetes mellitus with diabetic neuropathy, unspecified: Secondary | ICD-10-CM | POA: Diagnosis not present

## 2019-08-20 DIAGNOSIS — M79671 Pain in right foot: Secondary | ICD-10-CM | POA: Diagnosis not present

## 2019-08-20 DIAGNOSIS — M1990 Unspecified osteoarthritis, unspecified site: Secondary | ICD-10-CM | POA: Diagnosis not present

## 2019-08-22 LAB — PAP IG AND HPV HIGH-RISK: HPV, high-risk: NEGATIVE

## 2019-09-03 ENCOUNTER — Ambulatory Visit (HOSPITAL_COMMUNITY): Payer: Self-pay

## 2019-09-10 DIAGNOSIS — G218 Other secondary parkinsonism: Secondary | ICD-10-CM | POA: Diagnosis not present

## 2019-09-10 DIAGNOSIS — E1169 Type 2 diabetes mellitus with other specified complication: Secondary | ICD-10-CM | POA: Diagnosis not present

## 2019-09-10 DIAGNOSIS — G3184 Mild cognitive impairment, so stated: Secondary | ICD-10-CM | POA: Diagnosis not present

## 2019-09-10 DIAGNOSIS — G4733 Obstructive sleep apnea (adult) (pediatric): Secondary | ICD-10-CM | POA: Diagnosis not present

## 2019-09-16 ENCOUNTER — Other Ambulatory Visit (HOSPITAL_COMMUNITY): Payer: Self-pay | Admitting: Nurse Practitioner

## 2019-09-16 DIAGNOSIS — Z1231 Encounter for screening mammogram for malignant neoplasm of breast: Secondary | ICD-10-CM

## 2019-09-26 ENCOUNTER — Inpatient Hospital Stay (HOSPITAL_COMMUNITY): Admission: RE | Admit: 2019-09-26 | Payer: Self-pay | Source: Ambulatory Visit

## 2019-10-02 ENCOUNTER — Other Ambulatory Visit: Payer: Self-pay | Admitting: Family Medicine

## 2019-10-06 ENCOUNTER — Ambulatory Visit (HOSPITAL_COMMUNITY)
Admission: RE | Admit: 2019-10-06 | Discharge: 2019-10-06 | Disposition: A | Discharge status: Home / Self Care | Payer: BC Managed Care – PPO | Source: Ambulatory Visit | Attending: Nurse Practitioner | Admitting: Nurse Practitioner

## 2019-10-06 ENCOUNTER — Other Ambulatory Visit: Payer: Self-pay

## 2019-10-06 DIAGNOSIS — Z1231 Encounter for screening mammogram for malignant neoplasm of breast: Secondary | ICD-10-CM | POA: Insufficient documentation

## 2019-10-13 ENCOUNTER — Other Ambulatory Visit: Payer: Self-pay

## 2019-10-13 ENCOUNTER — Ambulatory Visit (INDEPENDENT_AMBULATORY_CARE_PROVIDER_SITE_OTHER): Payer: BC Managed Care – PPO | Admitting: Family Medicine

## 2019-10-13 DIAGNOSIS — E119 Type 2 diabetes mellitus without complications: Secondary | ICD-10-CM | POA: Diagnosis not present

## 2019-10-13 DIAGNOSIS — F419 Anxiety disorder, unspecified: Secondary | ICD-10-CM

## 2019-10-13 DIAGNOSIS — Z794 Long term (current) use of insulin: Secondary | ICD-10-CM

## 2019-10-13 DIAGNOSIS — R251 Tremor, unspecified: Secondary | ICD-10-CM

## 2019-10-13 DIAGNOSIS — I10 Essential (primary) hypertension: Secondary | ICD-10-CM

## 2019-10-13 DIAGNOSIS — F329 Major depressive disorder, single episode, unspecified: Secondary | ICD-10-CM | POA: Diagnosis not present

## 2019-10-13 DIAGNOSIS — F32A Depression, unspecified: Secondary | ICD-10-CM

## 2019-10-13 MED ORDER — INSULIN LISPRO 100 UNIT/ML ~~LOC~~ SOLN: SUBCUTANEOUS | 1 refills | Status: DC

## 2019-10-13 MED ORDER — OXAZEPAM 10 MG PO CAPS: 10.0000 mg | ORAL_CAPSULE | Freq: Two times a day (BID) | ORAL | 5 refills | Status: DC | PRN

## 2019-10-13 NOTE — Progress Notes (Signed)
   Subjective:  Audio only  Patient ID: Sarah Pope, female    DOB: 09/28/1956, 63 y.o.   MRN: [id number]  Diabetes She presents for her follow-up diabetic visit. She has type 2 diabetes mellitus. Risk factors for coronary artery disease include diabetes mellitus and post-menopausal. Current diabetic treatment includes insulin injections and oral agent (monotherapy). She is compliant with treatment all of the time. Her weight is stable. She is following a diabetic diet.   FBS 125 Patient reports sugars overall are in quite a bit better control    Review of Systems  Virtual Visit via Video Note  I connected with Alethia Berthold on 10/13/19 at  9:00 AM EST by a video enabled telemedicine application and verified that I am speaking with the correct person using two identifiers.  Location: Patient: Home Provider: Office   I discussed the limitations of evaluation and management by telemedicine and the availability of in person appointments. The patient expressed understanding and agreed to proceed.  History of Present Illness:    Observations/Objective:   Assessment and Plan:   Follow Up Instructions:    I discussed the assessment and treatment plan with the patient. The patient was provided an opportunity to ask questions and all were answered. The patient agreed with the plan and demonstrated an understanding of the instructions.   The patient was advised to call back or seek an in-person evaluation if the symptoms worsen or if the condition fails to improve as anticipated.  20 minutes of non-face-to-face time during this encounter.   Still experiencing substantial fatigue.  Along with challenges with mental health.  Followed by mental health specialist.  Arthritis is quite an aggravation.  As is the diabetes.  Also her sleep apnea.  Notes fatigue and inability to work with all of her chronic medical conditions both physical and mental     Objective:   Physical  Exam  Virtual     Assessment & Plan:  Impression type 2 diabetes.  Apparent improved control discussed compliance discussed  2.  Fatigue multifactorial  3.  Depression with element of anxiety followed primarily by mental health specialist  4.  Patient states there is no way she can return to her prior workplace will be coming off insurance in a month and is hoping for a free clinic type setting for her health care  5.  Tremors.  Did go on to see neurologist.  As directed by her psychiatrist.  They did not feel she had Parkinson's thankfully.  On medication now which is helping some  Follow-up in 3 to 4 months

## 2019-11-27 ENCOUNTER — Encounter: Payer: Self-pay | Admitting: Family Medicine

## 2019-12-04 DIAGNOSIS — Z029 Encounter for administrative examinations, unspecified: Secondary | ICD-10-CM

## 2019-12-09 ENCOUNTER — Encounter: Payer: Self-pay | Admitting: Physician Assistant

## 2019-12-09 ENCOUNTER — Ambulatory Visit: Payer: Medicaid Other | Admitting: Physician Assistant

## 2019-12-09 ENCOUNTER — Other Ambulatory Visit: Payer: Self-pay

## 2019-12-09 VITALS — BP 130/80 | HR 98 | Temp 98.2°F | Wt 212.8 lb

## 2019-12-09 DIAGNOSIS — R251 Tremor, unspecified: Secondary | ICD-10-CM

## 2019-12-09 DIAGNOSIS — E1165 Type 2 diabetes mellitus with hyperglycemia: Secondary | ICD-10-CM

## 2019-12-09 DIAGNOSIS — E669 Obesity, unspecified: Secondary | ICD-10-CM

## 2019-12-09 DIAGNOSIS — E039 Hypothyroidism, unspecified: Secondary | ICD-10-CM

## 2019-12-09 DIAGNOSIS — I10 Essential (primary) hypertension: Secondary | ICD-10-CM

## 2019-12-09 DIAGNOSIS — F418 Other specified anxiety disorders: Secondary | ICD-10-CM

## 2019-12-09 DIAGNOSIS — Z7689 Persons encountering health services in other specified circumstances: Secondary | ICD-10-CM

## 2019-12-09 NOTE — Progress Notes (Signed)
BP 130/80   Pulse 98   Temp 98.2 F (36.8 C)   Wt 212 lb 12.8 oz (96.5 kg)   SpO2 95%   BMI 35.41 kg/m    Subjective:    Patient ID: Sarah Pope, female    DOB: 1956-02-07, 64 y.o.   MRN: [id number]  HPI: Sarah Pope is a 64 y.o. female presenting on 12/09/2019 for New Patient (Initial Visit)   HPI    pt had a negative covid 19 screening questionnaire     Pt is a 24yoF who present today to establish care.  Pt previously with Dr Wolfgang Phoenix but now has no insurance.   She used to work but had to leave after having covid in July 2020.    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   Pt says she went to neurologist for tremor and was told it was not parkinsons-  She saw [place].  -  Treated for "tremors" she says he thinks related to having had covid.     Pt has Seen a psychiatrist-  She says that was In the past but that neurologist could take care of her.     Pt says her Breathing a little bit more trouble since out of inhalers.  She is working on trying to get disability  She denies depression but her chart says she was started on the citalopram and oxazepam for depression.   She then says she does okay as long as she takes her meds and takes time to herself  She thinks she filled out CAFA/financial assistance application and And medassist application  She says her fbs 150 this morning  Pt will run out of lantus in a few days.  She Is okay with novolog supply.         Relevant past medical, surgical, family and social history reviewed and updated as indicated. Interim medical history since our last visit reviewed. Allergies and medications reviewed and updated.    Current Outpatient Medications:  .  amantadine (SYMMETREL) 100 MG capsule, Take 100 mg by mouth 2 (two) times daily., Disp: , Rfl:  .  aspirin EC 81 MG tablet, Take 81 mg by mouth daily., Disp: , Rfl:  .  cetirizine (ZYRTEC) 10 MG tablet, Take 10 mg by mouth daily., Disp: , Rfl:  .   citalopram (CELEXA) 40 MG tablet, Take 1 tablet (40 mg total) by mouth daily., Disp: 30 tablet, Rfl: 5 .  diclofenac (VOLTAREN) 75 MG EC tablet, TAKE ONE TABLET BY MOUTH TWICE daily PRN pain. TAKE WITH FOOD., Disp: 28 tablet, Rfl: 2 .  hydrochlorothiazide (HYDRODIURIL) 12.5 MG tablet, Take 1 tablet (12.5 mg total) by mouth daily., Disp: 30 tablet, Rfl: 5 .  insulin glargine (LANTUS) 100 UNIT/ML injection, Inject 0.8 mLs (80 Units total) into the skin at bedtime., Disp: 5 mL, Rfl: 5 .  insulin lispro (HUMALOG) 100 UNIT/ML injection, Check your blood sugars at least twice daily preferably 3 times daily. Sliding scale: For a blood sugar of 121-150 give yourself 3 units; for blood sugar of 151-200 give 4 units; for blood sugar of 201-250 give 6 units; for blood sugar of 251-300 give 9 units; for blood sugar of 301-350 give 13 units; for blood glucose of 351-400 give 17 units., Disp: 10 mL, Rfl: 1 .  levothyroxine (SYNTHROID) 175 MCG tablet, Take 1 tablet (175 mcg total) by mouth daily., Disp: 30 tablet, Rfl: 5 .  losartan (COZAAR) 50 MG tablet, Take  1 tablet (50 mg total) by mouth daily., Disp: 30 tablet, Rfl: 5 .  metFORMIN (GLUCOPHAGE) 1000 MG tablet, Take 1 tablet (1,000 mg total) by mouth 2 (two) times daily with a meal., Disp: 60 tablet, Rfl: 5 .  oxazepam (SERAX) 10 MG capsule, Take 1 capsule (10 mg total) by mouth 2 (two) times daily as needed., Disp: 60 capsule, Rfl: 5 .  pantoprazole (PROTONIX) 40 MG tablet, Take 1 tablet (40 mg total) by mouth daily. For acid reflux, Disp: 30 tablet, Rfl: 5 .  rosuvastatin (CRESTOR) 20 MG tablet, Take 1 tablet (20 mg total) by mouth daily., Disp: 30 tablet, Rfl: 5 .  albuterol (VENTOLIN HFA) 108 (90 Base) MCG/ACT inhaler, Inhale 2 puffs into the lungs every 4 (four) hours as needed for wheezing or shortness of breath. (Patient not taking: Reported on 12/09/2019), Disp: , Rfl:  .  beclomethasone (QVAR) 80 MCG/ACT inhaler, Inhale 1 puff into the lungs 2 (two) times  daily. (Patient not taking: Reported on 12/09/2019), Disp: 3 Inhaler, Rfl: 1 .  budesonide (PULMICORT FLEXHALER) 180 MCG/ACT inhaler, Inhale 2 puffs into the lungs 2 (two) times daily. (Patient not taking: Reported on 12/09/2019), Disp: 3 Inhaler, Rfl: 1 .  ondansetron (ZOFRAN-ODT) 8 MG disintegrating tablet, TAKE ONE TABLET BY MOUTH THREE TIMES DAILY AS NEEDED FOR NAUSEA. (Patient not taking: Reported on 12/09/2019), Disp: 12 tablet, Rfl: 1 .  SF 5000 PLUS 1.1 % CREA dental cream, 3 (three) times daily. as directed, Disp: , Rfl: 99  Current Facility-Administered Medications:  .  0.9 %  sodium chloride infusion, 500 mL, Intravenous, Continuous, Ladene Artist, MD     Review of Systems  Per HPI unless specifically indicated above     Objective:    BP 130/80   Pulse 98   Temp 98.2 F (36.8 C)   Wt 212 lb 12.8 oz (96.5 kg)   SpO2 95%   BMI 35.41 kg/m   Wt Readings from Last 3 Encounters:  12/09/19 212 lb 12.8 oz (96.5 kg)  08/15/19 200 lb 3.2 oz (90.8 kg)  07/14/19 193 lb 9.6 oz (87.8 kg)    Physical Exam Vitals reviewed.  Constitutional:      General: She is not in acute distress.    Appearance: She is well-developed. She is obese. She is not ill-appearing.  HENT:     Head: Normocephalic and atraumatic.  Eyes:     Conjunctiva/sclera: Conjunctivae normal.     Pupils: Pupils are equal, round, and reactive to light.  Neck:     Thyroid: No thyromegaly.  Cardiovascular:     Rate and Rhythm: Normal rate and regular rhythm.  Pulmonary:     Effort: Pulmonary effort is normal.     Breath sounds: Normal breath sounds.  Abdominal:     General: Bowel sounds are normal.     Palpations: Abdomen is soft. There is no mass.     Tenderness: There is no abdominal tenderness.  Musculoskeletal:     Cervical back: Neck supple.     Right lower leg: No edema.     Left lower leg: No edema.  Lymphadenopathy:     Cervical: No cervical adenopathy.  Skin:    General: Skin is warm and dry.   Neurological:     Mental Status: She is alert and oriented to person, place, and time.     Motor: Tremor present.     Gait: Gait normal.  Psychiatric:        Attention and  Perception: Attention normal.        Speech: Speech normal.        Behavior: Behavior normal. Behavior is cooperative.            Assessment & Plan:    Encounter Diagnoses  Name Primary?  . Encounter to establish care Yes  . Uncontrolled type 2 diabetes mellitus with hyperglycemia ([place])   . Hypothyroidism, unspecified type   . Tremor   . Essential hypertension   . Obesity, unspecified classification, unspecified obesity type, unspecified whether serious comorbidity present   . Depression with anxiety       -will order insulin for pt; she Can't use vials of insulin due to tremor.  Will Order lantus and novolog -will Update labs -for her tremor, will Need to get records from dr [facility] office.  Pt will be Refered to neurologist in [place] (as Dr Merlene Laughter doesn't accept [facility] charity financial assistance)  -Will send rx for meds to [facility] after labs -pt to F/u 2 wk with virtual visit to check in

## 2019-12-16 ENCOUNTER — Other Ambulatory Visit: Payer: Self-pay

## 2019-12-16 ENCOUNTER — Other Ambulatory Visit (HOSPITAL_COMMUNITY)
Admission: RE | Admit: 2019-12-16 | Discharge: 2019-12-16 | Disposition: A | Discharge status: Home / Self Care | Payer: Medicaid Other | Source: Ambulatory Visit | Attending: Physician Assistant | Admitting: Physician Assistant

## 2019-12-16 DIAGNOSIS — E039 Hypothyroidism, unspecified: Secondary | ICD-10-CM | POA: Insufficient documentation

## 2019-12-16 DIAGNOSIS — E1165 Type 2 diabetes mellitus with hyperglycemia: Secondary | ICD-10-CM | POA: Diagnosis not present

## 2019-12-16 DIAGNOSIS — R69 Illness, unspecified: Secondary | ICD-10-CM | POA: Diagnosis present

## 2019-12-16 LAB — COMPREHENSIVE METABOLIC PANEL
ALT: 24 U/L (ref 0–44)
AST: 20 U/L (ref 15–41)
Albumin: 4.2 g/dL (ref 3.5–5.0)
Alkaline Phosphatase: 75 U/L (ref 38–126)
Anion gap: 8 (ref 5–15)
BUN: 15 mg/dL (ref 8–23)
CO2: 30 mmol/L (ref 22–32)
Calcium: 9.7 mg/dL (ref 8.9–10.3)
Chloride: 100 mmol/L (ref 98–111)
Creatinine, Ser: 0.61 mg/dL (ref 0.44–1.00)
GFR calc Af Amer: >60 mL/min (ref >60)
GFR calc non Af Amer: >60 mL/min (ref >60)
Glucose, Bld: 141 mg/dL — ABNORMAL HIGH (ref 70–99)
Potassium: 4.2 mmol/L (ref 3.5–5.1)
Sodium: 138 mmol/L (ref 135–145)
Total Bilirubin: 1.3 mg/dL — ABNORMAL HIGH (ref 0.3–1.2)
Total Protein: 7.4 g/dL (ref 6.5–8.1)

## 2019-12-16 LAB — LIPID PANEL
Cholesterol: 169 mg/dL (ref 0–200)
HDL: 50 mg/dL (ref >40)
LDL Cholesterol: 97 mg/dL (ref 0–99)
Total CHOL/HDL Ratio: 3.4 RATIO
Triglycerides: 108 mg/dL (ref <150)
VLDL: 22 mg/dL (ref 0–40)

## 2019-12-16 LAB — HEMOGLOBIN A1C
Hgb A1c MFr Bld: 7.9 % — ABNORMAL HIGH (ref 4.8–5.6)
Mean Plasma Glucose: 180.03 mg/dL

## 2019-12-16 LAB — TSH: TSH: 1.503 u[IU]/mL (ref 0.350–4.500)

## 2019-12-23 ENCOUNTER — Encounter: Payer: Self-pay | Admitting: Physician Assistant

## 2019-12-23 ENCOUNTER — Ambulatory Visit: Payer: Medicaid Other | Admitting: Physician Assistant

## 2019-12-23 DIAGNOSIS — J45909 Unspecified asthma, uncomplicated: Secondary | ICD-10-CM

## 2019-12-23 DIAGNOSIS — F418 Other specified anxiety disorders: Secondary | ICD-10-CM

## 2019-12-23 DIAGNOSIS — E669 Obesity, unspecified: Secondary | ICD-10-CM

## 2019-12-23 DIAGNOSIS — E1165 Type 2 diabetes mellitus with hyperglycemia: Secondary | ICD-10-CM

## 2019-12-23 DIAGNOSIS — E039 Hypothyroidism, unspecified: Secondary | ICD-10-CM

## 2019-12-23 DIAGNOSIS — E785 Hyperlipidemia, unspecified: Secondary | ICD-10-CM

## 2019-12-23 DIAGNOSIS — R251 Tremor, unspecified: Secondary | ICD-10-CM

## 2019-12-23 DIAGNOSIS — K219 Gastro-esophageal reflux disease without esophagitis: Secondary | ICD-10-CM

## 2019-12-23 DIAGNOSIS — I10 Essential (primary) hypertension: Secondary | ICD-10-CM

## 2019-12-23 MED ORDER — HYDROCHLOROTHIAZIDE 12.5 MG PO TABS: 12.5000 mg | ORAL_TABLET | Freq: Every day | ORAL | 1 refills | Status: DC

## 2019-12-23 MED ORDER — LEVOTHYROXINE SODIUM 175 MCG PO TABS: 175.0000 ug | ORAL_TABLET | Freq: Every day | ORAL | 1 refills | Status: DC

## 2019-12-23 MED ORDER — ATORVASTATIN CALCIUM 20 MG PO TABS: 20.0000 mg | ORAL_TABLET | Freq: Every day | ORAL | 1 refills | Status: DC

## 2019-12-23 MED ORDER — LOSARTAN POTASSIUM 50 MG PO TABS: 50.0000 mg | ORAL_TABLET | Freq: Every day | ORAL | 1 refills | Status: DC

## 2019-12-23 MED ORDER — OMEPRAZOLE 40 MG PO CPDR: 40.0000 mg | DELAYED_RELEASE_CAPSULE | Freq: Every day | ORAL | 1 refills | Status: DC

## 2019-12-23 MED ORDER — DULERA 100-5 MCG/ACT IN AERO: 2.0000 | INHALATION_SPRAY | Freq: Two times a day (BID) | RESPIRATORY_TRACT | 1 refills | Status: DC

## 2019-12-23 MED ORDER — METFORMIN HCL 1000 MG PO TABS: 1000.0000 mg | ORAL_TABLET | Freq: Two times a day (BID) | ORAL | 1 refills | Status: DC

## 2019-12-23 MED ORDER — ALBUTEROL SULFATE HFA 108 (90 BASE) MCG/ACT IN AERS: 2.0000 | INHALATION_SPRAY | Freq: Four times a day (QID) | RESPIRATORY_TRACT | 1 refills | Status: DC | PRN

## 2019-12-23 NOTE — Progress Notes (Signed)
There were no vitals taken for this visit.   Subjective:    Patient ID: Sarah Pope, female    DOB: 02/05/1956, 64 y.o.   MRN: [id number]  HPI: Sarah Pope is a 64 y.o. female presenting on 12/23/2019 for No chief complaint on file.   HPI   This is a telemedicine appointment due to coronavirus pandemic.  It is via Telephone as pt doesn't have a video enabled device.    I connected with  August Albino on 12/23/19 by a video enabled telemedicine application and verified that I am speaking with the correct person using two identifiers.   I discussed the limitations of evaluation and management by telemedicine. The patient expressed understanding and agreed to proceed.    Pt presented to office 2 weeks ago to establish care.   Her appointment today is to review labs.  Pt has been referred to Neurologist for her tremor.  Pt states neurologist prescribed-  She say he increased citlopram Pt has been to a Psychiatrist-  She says she Hasn't seen for long time-  she Takes oxepam and citalpram  Pt to go to neurologist in [place] for they accept CAFA.  She thiks she filled out the application for [facility] [employer] financial assistance.       Relevant past medical, surgical, family and social history reviewed and updated as indicated. Interim medical history since our last visit reviewed. Allergies and medications reviewed and updated.   Current Outpatient Medications:  .  albuterol (VENTOLIN HFA) 108 (90 Base) MCG/ACT inhaler, Inhale 2 puffs into the lungs every 4 (four) hours as needed for wheezing or shortness of breath., Disp: , Rfl:  .  amantadine (SYMMETREL) 100 MG capsule, Take 100 mg by mouth 2 (two) times daily., Disp: , Rfl:  .  aspirin EC 81 MG tablet, Take 81 mg by mouth daily., Disp: , Rfl:  .  Budeson-Glycopyrrol-Formoterol (BREZTRI AEROSPHERE) 160-9-4.8 MCG/ACT AERO, Inhale 2 puffs into the lungs in the morning and at bedtime., Disp: , Rfl:  .  cetirizine (ZYRTEC) 10 MG  tablet, Take 10 mg by mouth daily., Disp: , Rfl:  .  citalopram (CELEXA) 40 MG tablet, Take 1 tablet (40 mg total) by mouth daily., Disp: 30 tablet, Rfl: 5 .  diclofenac (VOLTAREN) 75 MG EC tablet, TAKE ONE TABLET BY MOUTH TWICE daily PRN pain. TAKE WITH FOOD., Disp: 28 tablet, Rfl: 2 .  hydrochlorothiazide (HYDRODIURIL) 12.5 MG tablet, Take 1 tablet (12.5 mg total) by mouth daily., Disp: 30 tablet, Rfl: 5 .  insulin glargine (LANTUS) 100 UNIT/ML injection, Inject 0.8 mLs (80 Units total) into the skin at bedtime., Disp: 5 mL, Rfl: 5 .  insulin lispro (HUMALOG) 100 UNIT/ML injection, Check your blood sugars at least twice daily preferably 3 times daily. Sliding scale: For a blood sugar of 121-150 give yourself 3 units; for blood sugar of 151-200 give 4 units; for blood sugar of 201-250 give 6 units; for blood sugar of 251-300 give 9 units; for blood sugar of 301-350 give 13 units; for blood glucose of 351-400 give 17 units., Disp: 10 mL, Rfl: 1 .  levothyroxine (SYNTHROID) 175 MCG tablet, Take 1 tablet (175 mcg total) by mouth daily., Disp: 30 tablet, Rfl: 5 .  losartan (COZAAR) 50 MG tablet, Take 1 tablet (50 mg total) by mouth daily., Disp: 30 tablet, Rfl: 5 .  metFORMIN (GLUCOPHAGE) 1000 MG tablet, Take 1 tablet (1,000 mg total) by mouth 2 (two) times daily with a meal.,  Disp: 60 tablet, Rfl: 5 .  oxazepam (SERAX) 10 MG capsule, Take 1 capsule (10 mg total) by mouth 2 (two) times daily as needed., Disp: 60 capsule, Rfl: 5 .  pantoprazole (PROTONIX) 40 MG tablet, Take 1 tablet (40 mg total) by mouth daily. For acid reflux, Disp: 30 tablet, Rfl: 5 .  rosuvastatin (CRESTOR) 20 MG tablet, Take 1 tablet (20 mg total) by mouth daily., Disp: 30 tablet, Rfl: 5 .  beclomethasone (QVAR) 80 MCG/ACT inhaler, Inhale 1 puff into the lungs 2 (two) times daily. (Patient not taking: Reported on 12/09/2019), Disp: 3 Inhaler, Rfl: 1 .  budesonide (PULMICORT FLEXHALER) 180 MCG/ACT inhaler, Inhale 2 puffs into the lungs 2  (two) times daily. (Patient not taking: Reported on 12/09/2019), Disp: 3 Inhaler, Rfl: 1 .  ondansetron (ZOFRAN-ODT) 8 MG disintegrating tablet, TAKE ONE TABLET BY MOUTH THREE TIMES DAILY AS NEEDED FOR NAUSEA. (Patient not taking: Reported on 12/09/2019), Disp: 12 tablet, Rfl: 1 .  SF 5000 PLUS 1.1 % CREA dental cream, 3 (three) times daily. as directed, Disp: , Rfl: 99    Review of Systems  Per HPI unless specifically indicated above     Objective:    There were no vitals taken for this visit.  Wt Readings from Last 3 Encounters:  12/09/19 212 lb 12.8 oz (96.5 kg)  08/15/19 200 lb 3.2 oz (90.8 kg)  07/14/19 193 lb 9.6 oz (87.8 kg)    Physical Exam Pulmonary:     Effort: No respiratory distress.  Neurological:     Mental Status: She is alert and oriented to person, place, and time.  Psychiatric:        Attention and Perception: Attention normal.        Speech: Speech normal.        Behavior: Behavior is cooperative.     Results for orders placed or performed during the hospital encounter of 12/16/19  Lipid panel  Result Value Ref Range   Cholesterol 169 0 - 200 mg/dL   Triglycerides 108 <150 mg/dL   HDL 50 >40 mg/dL   Total CHOL/HDL Ratio 3.4 RATIO   VLDL 22 0 - 40 mg/dL   LDL Cholesterol 97 0 - 99 mg/dL  Hemoglobin A1c  Result Value Ref Range   Hgb A1c MFr Bld 7.9 (H) 4.8 - 5.6 %   Mean Plasma Glucose 180.03 mg/dL  Comprehensive metabolic panel  Result Value Ref Range   Sodium 138 135 - 145 mmol/L   Potassium 4.2 3.5 - 5.1 mmol/L   Chloride 100 98 - 111 mmol/L   CO2 30 22 - 32 mmol/L   Glucose, Bld 141 (H) 70 - 99 mg/dL   BUN 15 8 - 23 mg/dL   Creatinine, Ser 0.61 0.44 - 1.00 mg/dL   Calcium 9.7 8.9 - 10.3 mg/dL   Total Protein 7.4 6.5 - 8.1 g/dL   Albumin 4.2 3.5 - 5.0 g/dL   AST 20 15 - 41 U/L   ALT 24 0 - 44 U/L   Alkaline Phosphatase 75 38 - 126 U/L   Total Bilirubin 1.3 (H) 0.3 - 1.2 mg/dL   GFR calc non Af Amer >60 >60 mL/min   GFR calc Af Amer >60  >60 mL/min   Anion gap 8 5 - 15  TSH  Result Value Ref Range   TSH 1.503 0.350 - 4.500 uIU/mL      Assessment & Plan:    Encounter Diagnoses  Name Primary?  Marland Kitchen Uncontrolled type 2 diabetes  mellitus with hyperglycemia ([place]) Yes  . Hyperlipidemia, unspecified hyperlipidemia type   . Hypothyroidism, unspecified type   . Depression with anxiety   . Essential hypertension   . Reactive airway disease without complication, unspecified asthma severity, unspecified whether persistent   . Tremor   . Gastroesophageal reflux disease, unspecified whether esophagitis present   . Obesity, unspecified classification, unspecified obesity type, unspecified whether serious comorbidity present       1. Uncontrolled diabetes -Reviewed labs with pt -discussed monitoring blood sugars and following diabetic diet -continue metformin and insulin   2. dyslipidemia Change crestor to lipitor due to to availability at [facility].  She is to follow lowfat diet  3. hypothyroidism Continue current levothyroxine  4. Anxiety and depression Discussed with pt that she needs to go to [facility] for [place] issues (neurology will not manage her mood).  She has contact information.  She is currently taking citalopram and oxazepam which were given to her by previous provider  5. Tremor Pt has been referred to neurologist for evaluation and treatment  6. RAD -rx albuterol inhaler to use prn.  She is changed to dulera due to it's what is available through [facility]  7. HTN continue hctz and losartan  8. GERD.  -changed to omeprazole due to availability     -pt to follow up  in 1 month in office.  She is to contact office sooner prn

## 2020-01-27 ENCOUNTER — Ambulatory Visit: Payer: Medicaid Other | Admitting: Physician Assistant

## 2020-02-17 ENCOUNTER — Telehealth: Payer: Self-pay | Admitting: Family Medicine

## 2020-02-17 NOTE — Telephone Encounter (Signed)
Pt would like to be re established with practice. Pt switched pcp for disability reasons and stated she told Dr. Richardson Landry she would return to our practice.

## 2020-02-18 NOTE — Telephone Encounter (Signed)
Very longstanding patient of our practice, so yes okay

## 2020-02-19 NOTE — Telephone Encounter (Signed)
Tried to contact pt mobile no vm and home number not in service.

## 2020-02-23 ENCOUNTER — Other Ambulatory Visit: Payer: Self-pay | Admitting: Family Medicine

## 2020-04-08 ENCOUNTER — Ambulatory Visit: Payer: Self-pay | Admitting: Family Medicine

## 2020-04-12 ENCOUNTER — Ambulatory Visit: Payer: Self-pay | Admitting: Family Medicine

## 2020-04-20 ENCOUNTER — Ambulatory Visit (INDEPENDENT_AMBULATORY_CARE_PROVIDER_SITE_OTHER): Payer: Medicaid Other | Admitting: Family Medicine

## 2020-04-20 ENCOUNTER — Encounter: Payer: Self-pay | Admitting: Family Medicine

## 2020-04-20 ENCOUNTER — Other Ambulatory Visit: Payer: Self-pay

## 2020-04-20 VITALS — BP 138/74 | HR 84 | Temp 97.0°F | Ht 65.0 in | Wt 212.4 lb

## 2020-04-20 DIAGNOSIS — R011 Cardiac murmur, unspecified: Secondary | ICD-10-CM | POA: Diagnosis not present

## 2020-04-20 DIAGNOSIS — I10 Essential (primary) hypertension: Secondary | ICD-10-CM

## 2020-04-20 DIAGNOSIS — G4700 Insomnia, unspecified: Secondary | ICD-10-CM

## 2020-04-20 DIAGNOSIS — E785 Hyperlipidemia, unspecified: Secondary | ICD-10-CM | POA: Diagnosis not present

## 2020-04-20 DIAGNOSIS — E119 Type 2 diabetes mellitus without complications: Secondary | ICD-10-CM

## 2020-04-20 DIAGNOSIS — Z794 Long term (current) use of insulin: Secondary | ICD-10-CM | POA: Diagnosis not present

## 2020-04-20 DIAGNOSIS — J069 Acute upper respiratory infection, unspecified: Secondary | ICD-10-CM

## 2020-04-20 LAB — POCT GLYCOSYLATED HEMOGLOBIN (HGB A1C): Hemoglobin A1C: 6.8 % — AB (ref 4.0–5.6)

## 2020-04-20 MED ORDER — CITALOPRAM HYDROBROMIDE 40 MG PO TABS: 40.0000 mg | ORAL_TABLET | Freq: Every day | ORAL | 5 refills | Status: DC

## 2020-04-20 MED ORDER — OXAZEPAM 10 MG PO CAPS: 10.0000 mg | ORAL_CAPSULE | Freq: Every evening | ORAL | 2 refills | Status: DC | PRN

## 2020-04-20 MED ORDER — AMOXICILLIN 500 MG PO CAPS: 500.0000 mg | ORAL_CAPSULE | Freq: Three times a day (TID) | ORAL | 0 refills | Status: DC

## 2020-04-20 MED ORDER — ATORVASTATIN CALCIUM 20 MG PO TABS: 20.0000 mg | ORAL_TABLET | Freq: Every day | ORAL | 1 refills | Status: DC

## 2020-04-20 MED ORDER — DICLOFENAC SODIUM 75 MG PO TBEC: DELAYED_RELEASE_TABLET | ORAL | 5 refills | Status: DC

## 2020-04-20 NOTE — Progress Notes (Signed)
Patient ID: Sarah Pope, female    DOB: 05-20-1956, 64 y.o.   MRN: [id number]   Chief Complaint  Patient presents with  . Diabetes   Subjective:    HPI  Pt here for diabetes follow up. Pt has been doing good with DM. Pt is having some ear and throat pain with some chest congestion, productive cough. Pt does see an eye doctor in [place]. Pt is also getting ready to set up podiatry appt. Checking sugars one to two days. Pt has tremors and has a hard time pricking finger. Pt had Freestyle 14 day but the monitor would not stick to skin.   Pt seen for f/u for diabetes.  Pt has seen endocrinology in a long time. Taking Lantus, humalog, and metformin. Pt was seeing a clinic when she didn't have insurance, in the past year. Already had "covid" and had fist vaccine.  Insomnia- oxazepam 10mg  prn.  Has been on it 2013. Taking it a few nights per week prn.  Thinking taking it 4-6x per week. Taking celexa 40mg   Pt was getting oxazepam 10mg  bid for 60 tablets with 5 refills since 2013. Pt still wanting to fill and continue with this medication.    Seeing neuro- taking amantadine 100mg  bid. Needing to f/u for appt with them.  Pt having 2 days of coughing, sore throat, and rt ear pain.  Hasn't taken any meds.  Used to take allergy meds.  "thinks this is her allergies."  Pt stating had covid 6/20.  Has had 1st vaccine for covid recently. No fever, no sick contacts.  No h/o murmur.  Never told she has a murmur.  No chest pain, palpations, syncope, or dizziness.  Medical History [name] has a past medical history of Acute respiratory failure with hypercapnia ([place]), Allergy, Anxiety, Asthma, Bronchitis with bronchospasm, Cancer ([place]), Depression, DM type 2 (diabetes mellitus, type 2) ([place]), FATTY LIVER DISEASE (08/05/2008), GERD (gastroesophageal reflux disease), Hyperlipidemia, Hypertension, Hypothyroidism, Kidney stones, Leukocytosis, Obesity, Class III, BMI 40-49.9 (morbid obesity)  ([place]), PERSONAL HX COLONIC POLYPS (09/01/2008), Seasonal allergies, and TRANSAMINASES, SERUM, ELEVATED (08/05/2008).   Outpatient Encounter Medications as of 04/20/2020  Medication Sig  . albuterol (VENTOLIN HFA) 108 (90 Base) MCG/ACT inhaler Inhale 2 puffs into the lungs every 6 (six) hours as needed for wheezing or shortness of breath.  Marland Kitchen amantadine (SYMMETREL) 100 MG capsule Take 100 mg by mouth 2 (two) times daily.  Marland Kitchen aspirin EC 81 MG tablet Take 81 mg by mouth daily.  Marland Kitchen atorvastatin (LIPITOR) 20 MG tablet Take 1 tablet (20 mg total) by mouth daily.  . cetirizine (ZYRTEC) 10 MG tablet Take 10 mg by mouth daily.  . citalopram (CELEXA) 40 MG tablet Take 1 tablet (40 mg total) by mouth daily.  . diclofenac (VOLTAREN) 75 MG EC tablet TAKE ONE TABLET BY MOUTH TWICE daily PRN pain. TAKE WITH FOOD.  . hydrochlorothiazide (HYDRODIURIL) 12.5 MG tablet Take 1 tablet (12.5 mg total) by mouth daily.  . insulin glargine (LANTUS) 100 UNIT/ML injection Inject 0.8 mLs (80 Units total) into the skin at bedtime.  . insulin lispro (HUMALOG) 100 UNIT/ML injection Check your blood sugars at least twice daily preferably 3 times daily. Sliding scale: For a blood sugar of 121-150 give yourself 3 units; for blood sugar of 151-200 give 4 units; for blood sugar of 201-250 give 6 units; for blood sugar of 251-300 give 9 units; for blood sugar of 301-350 give 13 units; for blood glucose of 351-400 give 17  units.  . levothyroxine (SYNTHROID) 175 MCG tablet Take 1 tablet (175 mcg total) by mouth daily.  Marland Kitchen losartan (COZAAR) 50 MG tablet Take 1 tablet (50 mg total) by mouth daily.  . metFORMIN (GLUCOPHAGE) 1000 MG tablet Take 1 tablet (1,000 mg total) by mouth 2 (two) times daily with a meal.  . mometasone-formoterol (DULERA) 100-5 MCG/ACT AERO Inhale 2 puffs into the lungs 2 (two) times daily.  Marland Kitchen omeprazole (PRILOSEC) 40 MG capsule Take 1 capsule (40 mg total) by mouth daily.  Marland Kitchen oxazepam (SERAX) 10 MG capsule Take 1 capsule  (10 mg total) by mouth at bedtime as needed.  . SF 5000 PLUS 1.1 % CREA dental cream 3 (three) times daily. as directed  . [DISCONTINUED] atorvastatin (LIPITOR) 20 MG tablet Take 1 tablet (20 mg total) by mouth daily.  . [DISCONTINUED] citalopram (CELEXA) 40 MG tablet Take 1 tablet (40 mg total) by mouth daily.  . [DISCONTINUED] diclofenac (VOLTAREN) 75 MG EC tablet TAKE ONE TABLET BY MOUTH TWICE daily PRN pain. TAKE WITH FOOD.  . [DISCONTINUED] oxazepam (SERAX) 10 MG capsule Take 1 capsule (10 mg total) by mouth 2 (two) times daily as needed.  Marland Kitchen amoxicillin (AMOXIL) 500 MG capsule Take 1 capsule (500 mg total) by mouth 3 (three) times daily.   No facility-administered encounter medications on file as of 04/20/2020.     Review of Systems  Constitutional: Negative for chills and fever.  HENT: Positive for ear pain and sore throat. Negative for congestion, rhinorrhea, sinus pressure and sinus pain.   Eyes: Negative for pain, discharge and itching.  Respiratory: Positive for cough. Negative for wheezing.   Cardiovascular: Negative for chest pain, palpitations and leg swelling.  Gastrointestinal: Negative for constipation, diarrhea, nausea and vomiting.  Skin: Negative for rash.  Neurological: Positive for tremors (chronic). Negative for dizziness and syncope.  Psychiatric/Behavioral: Positive for sleep disturbance. Negative for dysphoric mood. The patient is not nervous/anxious.      Vitals BP 138/74   Pulse 84   Temp (!) 97 F (36.1 C)   Ht 5\' 5"  (1.651 m)   Wt 212 lb 6.4 oz (96.3 kg)   SpO2 99%   BMI 35.35 kg/m   Objective:   Physical Exam Vitals and nursing note reviewed.  Constitutional:      General: She is not in acute distress.    Appearance: Normal appearance. She is not ill-appearing.  HENT:     Head: Normocephalic and atraumatic.     Right Ear: Tympanic membrane, ear canal and external ear normal.     Left Ear: Tympanic membrane, ear canal and external ear normal.       Nose: Nose normal. No congestion or rhinorrhea.     Mouth/Throat:     Mouth: Mucous membranes are moist.     Pharynx: Oropharynx is clear. No oropharyngeal exudate or posterior oropharyngeal erythema.  Eyes:     Extraocular Movements: Extraocular movements intact.     Conjunctiva/sclera: Conjunctivae normal.     Pupils: Pupils are equal, round, and reactive to light.  Cardiovascular:     Rate and Rhythm: Normal rate and regular rhythm.     Pulses: Normal pulses.     Heart sounds: Murmur (systolic) heard.   Pulmonary:     Effort: Pulmonary effort is normal.     Breath sounds: Normal breath sounds. No wheezing, rhonchi or rales.  Musculoskeletal:        General: Normal range of motion.     Right lower leg:  No edema.     Left lower leg: No edema.  Skin:    General: Skin is warm and dry.     Findings: No lesion or rash.  Neurological:     General: No focal deficit present.     Mental Status: She is alert and oriented to person, place, and time.     Cranial Nerves: No cranial nerve deficit.  Psychiatric:        Mood and Affect: Mood normal.        Behavior: Behavior normal.        Thought Content: Thought content normal.        Judgment: Judgment normal.      Assessment and Plan   1. Type 2 diabetes mellitus without complication, with long-term current use of insulin (HCC) - POCT HgB A1C  2. Systolic murmur - Ambulatory referral to Cardiology  3. Essential hypertension - Ambulatory referral to Cardiology  4. Hyperlipidemia LDL goal <100 - Ambulatory referral to Cardiology  5. Viral upper respiratory tract infection  6. Insomnia, unspecified type - oxazepam (SERAX) 10 MG capsule; Take 1 capsule (10 mg total) by mouth at bedtime as needed.  Dispense: 30 capsule; Refill: 2   Viral URI- Pt given watch and wait script for abx her URI.  Pt given amoxicillin.  Reviewed that likely this is a viral infection and won't get better with antibiotics, if not improving in  next 3-5 days may start amoxicillin. Advising zyrtec and flonse.  Tylenol or ibuprofen prn.    DM2- Hba1c has improved from 7.9 to 6.8.  Last labs reviewed 2/21.   New murmur heard on exam.  Pt stating not ever told she has a murmur.  Advising needing an echo and f/u with cards.  Submitted referral to cards.  Insomnia- long discussion about oxazepam and taking it long term may cause memory loss, dementia, falls over time. Pt wishing to continue with the medication.   F/u 57mo or prn.

## 2020-05-05 ENCOUNTER — Other Ambulatory Visit: Payer: Self-pay | Admitting: Nurse Practitioner

## 2020-05-13 ENCOUNTER — Telehealth: Payer: Self-pay | Admitting: Family Medicine

## 2020-05-13 NOTE — Telephone Encounter (Signed)
Please schedule appt. Needs to be seen first.

## 2020-05-13 NOTE — Telephone Encounter (Signed)
Patient had tooth pull two days ago now she states having ear pain and requesting something called into [facility]

## 2020-05-14 NOTE — Telephone Encounter (Signed)
Tried to call, phone not working, Advertising account executive not set up

## 2020-05-17 ENCOUNTER — Encounter: Payer: Self-pay | Admitting: Cardiology

## 2020-05-17 ENCOUNTER — Ambulatory Visit (INDEPENDENT_AMBULATORY_CARE_PROVIDER_SITE_OTHER): Payer: Medicaid Other | Admitting: Cardiology

## 2020-05-17 ENCOUNTER — Other Ambulatory Visit: Payer: Self-pay

## 2020-05-17 VITALS — BP 114/72 | HR 94 | Ht 65.0 in | Wt 211.4 lb

## 2020-05-17 DIAGNOSIS — R011 Cardiac murmur, unspecified: Secondary | ICD-10-CM

## 2020-05-17 DIAGNOSIS — I10 Essential (primary) hypertension: Secondary | ICD-10-CM

## 2020-05-17 NOTE — Progress Notes (Signed)
Clinical Summary Sarah Pope is a 64 y.o.female seen today as a new consult, referred by Dr Lovena Le for heart murmur  1. Heart murmur - noted at recent pcp visit - Jan 2014 echo LVEF 55-60%, mild AV and MV anular calcification - occasional SOB she attributes to her chronic asthma. No recent edema. No chest pains.   Past Medical History:  Diagnosis Date  . Acute respiratory failure with hypercapnia ([place])   . Allergy   . Anxiety   . Asthma   . Bronchitis with bronchospasm   . Cancer ([place])    skin  . Depression   . DM type 2 (diabetes mellitus, type 2) ([place])   . FATTY LIVER DISEASE 08/05/2008  . GERD (gastroesophageal reflux disease)   . Hyperlipidemia   . Hypertension   . Hypothyroidism   . Kidney stones   . Leukocytosis   . Obesity, Class III, BMI 40-49.9 (morbid obesity) ([place])   . PERSONAL HX COLONIC POLYPS 09/01/2008  . Seasonal allergies   . TRANSAMINASES, SERUM, ELEVATED 08/05/2008     Allergies  Allergen Reactions  . Codeine Nausea Only  . Sulfa Antibiotics     itching     Current Outpatient Medications  Medication Sig Dispense Refill  . albuterol (VENTOLIN HFA) 108 (90 Base) MCG/ACT inhaler Inhale 2 puffs into the lungs every 6 (six) hours as needed for wheezing or shortness of breath. 18 g 1  . amantadine (SYMMETREL) 100 MG capsule Take 100 mg by mouth 2 (two) times daily.    Marland Kitchen amoxicillin (AMOXIL) 500 MG capsule Take 1 capsule (500 mg total) by mouth 3 (three) times daily. 30 capsule 0  . aspirin EC 81 MG tablet Take 81 mg by mouth daily.    Marland Kitchen atorvastatin (LIPITOR) 20 MG tablet Take 1 tablet (20 mg total) by mouth daily. 90 tablet 1  . cetirizine (ZYRTEC) 10 MG tablet Take 10 mg by mouth daily.    . citalopram (CELEXA) 40 MG tablet Take 1 tablet (40 mg total) by mouth daily. 30 tablet 5  . diclofenac (VOLTAREN) 75 MG EC tablet TAKE ONE TABLET BY MOUTH TWICE daily PRN pain. TAKE WITH FOOD. 28 tablet 5  . hydrochlorothiazide (HYDRODIURIL) 12.5 MG tablet  TAKE ONE TABLET BY MOUTH DAILY. 30 tablet 5  . insulin glargine (LANTUS) 100 UNIT/ML injection Inject 0.8 mLs (80 Units total) into the skin at bedtime. 5 mL 5  . insulin lispro (HUMALOG) 100 UNIT/ML injection Check your blood sugars at least twice daily preferably 3 times daily. Sliding scale: For a blood sugar of 121-150 give yourself 3 units; for blood sugar of 151-200 give 4 units; for blood sugar of 201-250 give 6 units; for blood sugar of 251-300 give 9 units; for blood sugar of 301-350 give 13 units; for blood glucose of 351-400 give 17 units. 10 mL 1  . levothyroxine (SYNTHROID) 175 MCG tablet Take 1 tablet (175 mcg total) by mouth daily. 90 tablet 1  . losartan (COZAAR) 50 MG tablet Take 1 tablet (50 mg total) by mouth daily. 90 tablet 1  . metFORMIN (GLUCOPHAGE) 1000 MG tablet Take 1 tablet (1,000 mg total) by mouth 2 (two) times daily with a meal. 180 tablet 1  . mometasone-formoterol (DULERA) 100-5 MCG/ACT AERO Inhale 2 puffs into the lungs 2 (two) times daily. 13 g 1  . omeprazole (PRILOSEC) 40 MG capsule Take 1 capsule (40 mg total) by mouth daily. 90 capsule 1  . oxazepam (SERAX) 10 MG  capsule Take 1 capsule (10 mg total) by mouth at bedtime as needed. 30 capsule 2  . SF 5000 PLUS 1.1 % CREA dental cream 3 (three) times daily. as directed  99   No current facility-administered medications for this visit.     Past Surgical History:  Procedure Laterality Date  . COLONOSCOPY  2013   Repeat 10 years  . MANDIBLE SURGERY  1988   realignment  . NOSE SURGERY  2007   sinus infections  . POLYPECTOMY    . skin cancer removal    . TONSILLECTOMY       Allergies  Allergen Reactions  . Codeine Nausea Only  . Sulfa Antibiotics     itching      Family History  Problem Relation Age of Onset  . Allergies Daughter   . Breast cancer Mother   . Diabetes Mother   . Prostate cancer Father   . Heart attack Maternal Grandmother   . Colon cancer Neg Hx   . Esophageal cancer Neg Hx     . Rectal cancer Neg Hx   . Stomach cancer Neg Hx   . Colon polyps Neg Hx      Social History Sarah Pope reports that she quit smoking about 37 years ago. Her smoking use included cigarettes. She has a 2.50 pack-year smoking history. She has never used smokeless tobacco. Sarah Pope reports no history of alcohol use.   Review of Systems CONSTITUTIONAL: No weight loss, fever, chills, weakness or fatigue.  HEENT: Eyes: No visual loss, blurred vision, double vision or yellow sclerae.No hearing loss, sneezing, congestion, runny nose or sore throat.  SKIN: No rash or itching.  CARDIOVASCULAR: per hpi RESPIRATORY: per hpi GASTROINTESTINAL: No anorexia, nausea, vomiting or diarrhea. No abdominal pain or blood.  GENITOURINARY: No burning on urination, no polyuria NEUROLOGICAL: No headache, dizziness, syncope, paralysis, ataxia, numbness or tingling in the extremities. No change in bowel or bladder control.  MUSCULOSKELETAL: No muscle, back pain, joint pain or stiffness.  LYMPHATICS: No enlarged nodes. No history of splenectomy.  PSYCHIATRIC: No history of depression or anxiety.  ENDOCRINOLOGIC: No reports of sweating, cold or heat intolerance. No polyuria or polydipsia.  Marland Kitchen   Physical Examination Today's Vitals   05/17/20 0909  BP: 114/72  Pulse: 94  SpO2: 95%  Weight: (!) 211 lb 6.4 oz (95.9 kg)  Height: 5\' 5"  (1.651 m)   Body mass index is 35.18 kg/m.  Gen: resting comfortably, no acute distress HEENT: no scleral icterus, pupils equal round and reactive, no palptable cervical adenopathy,  CV: RRR, 3/6 systolic murmur rusb, no jvd Resp: Clear to auscultation bilaterally GI: abdomen is soft, non-tender, non-distended, normal bowel sounds, no hepatosplenomegaly MSK: extremities are warm, no edema.  Skin: warm, no rash Neuro:  no focal deficits Psych: appropriate affect     Assessment and Plan  1. Heart murmur - no significant symptoms - obtain echo to further evaluate  etiology and severity of potential valvular dysfunction  EKG today shows SR, no ischemic changes   Arnoldo Lenis, M.D.

## 2020-05-17 NOTE — Patient Instructions (Addendum)
Medication Instructions:   Your physician recommends that you continue on your current medications as directed. Please refer to the Current Medication list given to you today. *If you need a refill on your cardiac medications before your next appointment, please call your pharmacy*   Lab Work:  NONE If you have labs (blood work) drawn today and your tests are completely normal, you will receive your results only by:  [facility] (if you have MyChart) OR  A paper copy in the mail If you have any lab test that is abnormal or we need to change your treatment, we will call you to review the results.   Testing/Procedures: Your physician has requested that you have an echocardiogram. Echocardiography is a painless test that uses sound waves to create images of your heart. It provides your doctor with information about the size and shape of your heart and how well your hearts chambers and valves are working. This procedure takes approximately one hour. There are no restrictions for this procedure.   Follow-Up: At [facility], you and your health needs are our priority.  As part of our continuing mission to provide you with exceptional heart care, we have created designated Provider Care Teams.  These Care Teams include your primary Cardiologist (physician) and Advanced Practice Providers (APPs -  Physician Assistants and Nurse Practitioners) who all work together to provide you with the care you need, when you need it.  We recommend signing up for the patient portal called "MyChart".  Sign up information is provided on this After Visit Summary.  MyChart is used to connect with patients for Virtual Visits (Telemedicine).  Patients are able to view lab/test results, encounter notes, upcoming appointments, etc.  Non-urgent messages can be sent to your provider as well.   To learn more about what you can do with MyChart, go to [email or web address].    Your next appointment:     pending  The format for your next appointment:    In Person  Provider:   You may see Carlyle Dolly, MD or the following Advanced Practice Provider on your designated Care Team:    Katina Dung, NP    Other Instructions

## 2020-05-27 ENCOUNTER — Ambulatory Visit (INDEPENDENT_AMBULATORY_CARE_PROVIDER_SITE_OTHER): Payer: Medicaid Other

## 2020-05-27 DIAGNOSIS — R011 Cardiac murmur, unspecified: Secondary | ICD-10-CM | POA: Diagnosis not present

## 2020-05-27 LAB — ECHOCARDIOGRAM COMPLETE
AR max vel: 0.68 cm2
AV Area VTI: 0.77 cm2
AV Area mean vel: 0.7 cm2
AV Mean grad: 29.5 mmHg
AV Peak grad: 52.9 mmHg
Ao pk vel: 3.64 m/s
Area-P 1/2: 5.54 cm2
Calc EF: 67 %
P 1/2 time: 435 msec
S' Lateral: 2.1 cm
Single Plane A2C EF: 66.7 %
Single Plane A4C EF: 69.5 %

## 2020-05-31 ENCOUNTER — Emergency Department (HOSPITAL_COMMUNITY): Payer: 59

## 2020-05-31 ENCOUNTER — Encounter (HOSPITAL_COMMUNITY): Payer: Self-pay | Admitting: *Deleted

## 2020-05-31 ENCOUNTER — Emergency Department (HOSPITAL_COMMUNITY)
Admission: EM | Admit: 2020-05-31 | Discharge: 2020-05-31 | Disposition: A | Discharge status: Home / Self Care | Payer: 59 | Attending: Emergency Medicine | Admitting: Emergency Medicine

## 2020-05-31 ENCOUNTER — Other Ambulatory Visit: Payer: Self-pay

## 2020-05-31 DIAGNOSIS — E039 Hypothyroidism, unspecified: Secondary | ICD-10-CM | POA: Diagnosis not present

## 2020-05-31 DIAGNOSIS — Z87891 Personal history of nicotine dependence: Secondary | ICD-10-CM | POA: Diagnosis not present

## 2020-05-31 DIAGNOSIS — I10 Essential (primary) hypertension: Secondary | ICD-10-CM | POA: Insufficient documentation

## 2020-05-31 DIAGNOSIS — Z20822 Contact with and (suspected) exposure to covid-19: Secondary | ICD-10-CM | POA: Diagnosis not present

## 2020-05-31 DIAGNOSIS — E119 Type 2 diabetes mellitus without complications: Secondary | ICD-10-CM | POA: Insufficient documentation

## 2020-05-31 DIAGNOSIS — J45909 Unspecified asthma, uncomplicated: Secondary | ICD-10-CM | POA: Insufficient documentation

## 2020-05-31 DIAGNOSIS — J069 Acute upper respiratory infection, unspecified: Secondary | ICD-10-CM | POA: Diagnosis not present

## 2020-05-31 DIAGNOSIS — C4490 Unspecified malignant neoplasm of skin, unspecified: Secondary | ICD-10-CM | POA: Diagnosis not present

## 2020-05-31 DIAGNOSIS — R05 Cough: Secondary | ICD-10-CM | POA: Diagnosis present

## 2020-05-31 LAB — CBC WITH DIFFERENTIAL/PLATELET
Abs Immature Granulocytes: 0.02 10*3/uL (ref 0.00–0.07)
Basophils Absolute: 0.1 10*3/uL (ref 0.0–0.1)
Basophils Relative: 1 %
Eosinophils Absolute: 0.2 10*3/uL (ref 0.0–0.5)
Eosinophils Relative: 3 %
HCT: 41.2 % (ref 36.0–46.0)
Hemoglobin: 12.9 g/dL (ref 12.0–15.0)
Immature Granulocytes: 0 %
Lymphocytes Relative: 19 %
Lymphs Abs: 1.3 10*3/uL (ref 0.7–4.0)
MCH: 25.9 pg — ABNORMAL LOW (ref 26.0–34.0)
MCHC: 31.3 g/dL (ref 30.0–36.0)
MCV: 82.7 fL (ref 80.0–100.0)
Monocytes Absolute: 0.9 10*3/uL (ref 0.1–1.0)
Monocytes Relative: 13 %
Neutro Abs: 4.3 10*3/uL (ref 1.7–7.7)
Neutrophils Relative %: 64 %
Platelets: 219 10*3/uL (ref 150–400)
RBC: 4.98 MIL/uL (ref 3.87–5.11)
RDW: 13.2 % (ref 11.5–15.5)
WBC: 6.7 10*3/uL (ref 4.0–10.5)
nRBC: 0 % (ref 0.0–0.2)

## 2020-05-31 LAB — COMPREHENSIVE METABOLIC PANEL
ALT: 27 U/L (ref 0–44)
AST: 31 U/L (ref 15–41)
Albumin: 4.2 g/dL (ref 3.5–5.0)
Alkaline Phosphatase: 74 U/L (ref 38–126)
Anion gap: 13 (ref 5–15)
BUN: 16 mg/dL (ref 8–23)
CO2: 26 mmol/L (ref 22–32)
Calcium: 9.4 mg/dL (ref 8.9–10.3)
Chloride: 96 mmol/L — ABNORMAL LOW (ref 98–111)
Creatinine, Ser: 0.86 mg/dL (ref 0.44–1.00)
GFR calc Af Amer: >60 mL/min (ref >60)
GFR calc non Af Amer: >60 mL/min (ref >60)
Glucose, Bld: 175 mg/dL — ABNORMAL HIGH (ref 70–99)
Potassium: 4.2 mmol/L (ref 3.5–5.1)
Sodium: 135 mmol/L (ref 135–145)
Total Bilirubin: 1 mg/dL (ref 0.3–1.2)
Total Protein: 7.8 g/dL (ref 6.5–8.1)

## 2020-05-31 LAB — SARS CORONAVIRUS 2 BY RT PCR (HOSPITAL ORDER, PERFORMED IN ~~LOC~~ HOSPITAL LAB): SARS Coronavirus 2: NEGATIVE

## 2020-05-31 LAB — LIPASE, BLOOD: Lipase: 35 U/L (ref 11–51)

## 2020-05-31 MED ORDER — SODIUM CHLORIDE 0.9 % IV BOLUS
500.0000 mL | Freq: Once | INTRAVENOUS | Status: AC
  Administered 2020-05-31: 500 mL via INTRAVENOUS

## 2020-05-31 MED ORDER — ONDANSETRON 4 MG PO TBDP
4.0000 mg | ORAL_TABLET | Freq: Once | ORAL | Status: AC
  Administered 2020-05-31: 4 mg via ORAL
  Filled 2020-05-31: qty 1

## 2020-05-31 MED ORDER — ALBUTEROL SULFATE HFA 108 (90 BASE) MCG/ACT IN AERS: 2.0000 | INHALATION_SPRAY | Freq: Four times a day (QID) | RESPIRATORY_TRACT | 1 refills | Status: DC | PRN

## 2020-05-31 MED ORDER — DOXYCYCLINE HYCLATE 100 MG PO CAPS: 100.0000 mg | ORAL_CAPSULE | Freq: Two times a day (BID) | ORAL | 0 refills | Status: DC

## 2020-05-31 MED ORDER — PREDNISONE 20 MG PO TABS: 40.0000 mg | ORAL_TABLET | Freq: Every day | ORAL | 0 refills | Status: AC

## 2020-05-31 MED ORDER — ALBUTEROL SULFATE HFA 108 (90 BASE) MCG/ACT IN AERS
2.0000 | INHALATION_SPRAY | Freq: Once | RESPIRATORY_TRACT | Status: AC
  Administered 2020-05-31: 2 via RESPIRATORY_TRACT
  Filled 2020-05-31: qty 6.7

## 2020-05-31 MED ORDER — ACETAMINOPHEN 500 MG PO TABS
1000.0000 mg | ORAL_TABLET | Freq: Once | ORAL | Status: AC
  Administered 2020-05-31: 1000 mg via ORAL
  Filled 2020-05-31: qty 2

## 2020-05-31 NOTE — ED Provider Notes (Signed)
Emergency Department Provider Note   I have reviewed the triage vital signs and the nursing notes.   HISTORY  Chief Complaint Cough   HPI Sarah Pope is a 64 y.o. female with PMH reviewed below presents to the ED with HA, cough, and vomiting.  Symptoms have developed over the past 3 days.  Patient had one episode of nonbloody emesis yesterday but none since.  She was able to drink coffee this morning without vomiting.  She has had frequent coughing which she believes is causing some generalized belly soreness.  She is having chest discomfort with cough.  Past Medical History:  Diagnosis Date  . Acute respiratory failure with hypercapnia ([place])   . Allergy   . Anxiety   . Asthma   . Bronchitis with bronchospasm   . Cancer ([place])    skin  . Depression   . DM type 2 (diabetes mellitus, type 2) ([place])   . FATTY LIVER DISEASE 08/05/2008  . GERD (gastroesophageal reflux disease)   . Hyperlipidemia   . Hypertension   . Hypothyroidism   . Kidney stones   . Leukocytosis   . Obesity, Class III, BMI 40-49.9 (morbid obesity) ([place])   . PERSONAL HX COLONIC POLYPS 09/01/2008  . Seasonal allergies   . TRANSAMINASES, SERUM, ELEVATED 08/05/2008    Patient Active Problem List   Diagnosis Date Noted  . Depression with anxiety 04/10/2018  . Hypomagnesemia 10/05/2016  . Varicose veins of both lower extremities 10/05/2016  . Depression 09/29/2015  . Hyperlipidemia LDL goal <100 02/05/2013  . OSA (obstructive sleep apnea) 12/02/2012  . GERD (gastroesophageal reflux disease) 10/23/2012  . Moderate persistent asthma with reflux disease 09/02/2012  . DM type 2 (diabetes mellitus, type 2) ([place]) 06/30/2012  . HTN (hypertension) 06/30/2012  . Seasonal allergies 06/30/2012  . Hypothyroidism 06/30/2012  . Elevated LFTs 06/30/2012  . OBESITY 08/05/2008    Past Surgical History:  Procedure Laterality Date  . COLONOSCOPY  2013   Repeat 10 years  . MANDIBLE SURGERY  1988   realignment    . NOSE SURGERY  2007   sinus infections  . POLYPECTOMY    . skin cancer removal    . TONSILLECTOMY      Allergies Codeine and Sulfa antibiotics  Family History  Problem Relation Age of Onset  . Allergies Daughter   . Breast cancer Mother   . Diabetes Mother   . Prostate cancer Father   . Heart attack Maternal Grandmother   . Colon cancer Neg Hx   . Esophageal cancer Neg Hx   . Rectal cancer Neg Hx   . Stomach cancer Neg Hx   . Colon polyps Neg Hx     Social History Social History   Tobacco Use  . Smoking status: Former Smoker    Packs/day: 0.50    Years: 5.00    Pack years: 2.50    Types: Cigarettes    Quit date: 10/23/1982    Years since quitting: 37.6  . Smokeless tobacco: Never Used  Substance Use Topics  . Alcohol use: No  . Drug use: No    Review of Systems  Constitutional: No fever/chills Eyes: No visual changes. ENT: No sore throat. Cardiovascular: Denies chest pain. Respiratory: Denies shortness of breath. Positive cough.  Gastrointestinal: No abdominal pain. Positive nausea and vomiting.  No diarrhea.  No constipation. Genitourinary: Negative for dysuria. Musculoskeletal: Negative for back pain. Skin: Negative for rash. Neurological: Negative for focal weakness or numbness. Positive HA.  10-point ROS otherwise negative.  ____________________________________________   PHYSICAL EXAM:  VITAL SIGNS: ED Triage Vitals  Enc Vitals Group     BP 05/31/20 0848 (!) 108/91     Pulse Rate 05/31/20 0848 (!) 112     Resp 05/31/20 0848 20     Temp 05/31/20 0848 98.7 F (37.1 C)     Temp Source 05/31/20 0848 Oral     SpO2 05/31/20 0848 98 %     Weight 05/31/20 0849 200 lb (90.7 kg)     Height 05/31/20 0849 5\' 5"  (1.651 m)   Constitutional: Alert and oriented. Well appearing and in no acute distress. Eyes: Conjunctivae are normal.  Head: Atraumatic. Nose: No congestion/rhinnorhea. Mouth/Throat: Mucous membranes are moist.  Neck: No  stridor. Cardiovascular: Normal rate, regular rhythm. Good peripheral circulation. Grossly normal heart sounds.   Respiratory: Normal respiratory effort.  No retractions. Lungs CTAB. Gastrointestinal: Soft and nontender. No distention.  Musculoskeletal: No lower extremity tenderness nor edema. No gross deformities of extremities. Neurologic:  Normal speech and language. No gross focal neurologic deficits are appreciated.  Skin:  Skin is warm, dry and intact. No rash noted.  ____________________________________________   LABS (all labs ordered are listed, but only abnormal results are displayed)  Labs Reviewed  COMPREHENSIVE METABOLIC PANEL - Abnormal; Notable for the following components:      Result Value   Chloride 96 (*)    Glucose, Bld 175 (*)    All other components within normal limits  CBC WITH DIFFERENTIAL/PLATELET - Abnormal; Notable for the following components:   MCH 25.9 (*)    All other components within normal limits  SARS CORONAVIRUS 2 BY RT PCR (HOSPITAL ORDER, [facility])  LIPASE, BLOOD   ____________________________________________  EKG  None ____________________________________________  RADIOLOGY  CXR reviewed.  ____________________________________________   PROCEDURES  Procedure(s) performed:   Procedures  None ____________________________________________   INITIAL IMPRESSION / ASSESSMENT AND PLAN / ED COURSE  Pertinent labs & imaging results that were available during my care of the patient were reviewed by me and considered in my medical decision making (see chart for details).   Patient with URI symptoms. CXR reviewed along with labs. No acute findings. COVID negative. Patient feeling improved after albuterol. Will send on steroid burst. Normal vitals. No hypoxemia.   ____________________________________________  FINAL CLINICAL IMPRESSION(S) / ED DIAGNOSES  Final diagnoses:  Viral URI with cough      MEDICATIONS GIVEN DURING THIS VISIT:  Medications  ondansetron (ZOFRAN-ODT) disintegrating tablet 4 mg (4 mg Oral Given 05/31/20 0946)  acetaminophen (TYLENOL) tablet 1,000 mg (1,000 mg Oral Given 05/31/20 0946)  albuterol (VENTOLIN HFA) 108 (90 Base) MCG/ACT inhaler 2 puff (2 puffs Inhalation Given 05/31/20 0946)  sodium chloride 0.9 % bolus 500 mL (0 mLs Intravenous Stopped 05/31/20 1206)     NEW OUTPATIENT MEDICATIONS STARTED DURING THIS VISIT:  Discharge Medication List as of 05/31/2020 11:20 AM    START taking these medications   Details  doxycycline (VIBRAMYCIN) 100 MG capsule Take 1 capsule (100 mg total) by mouth 2 (two) times daily., Starting Mon 05/31/2020, Normal    predniSONE (DELTASONE) 20 MG tablet Take 2 tablets (40 mg total) by mouth daily for 4 days., Starting Mon 05/31/2020, Until Fri 06/04/2020, Normal        Note:  This document was prepared using Dragon voice recognition software and may include unintentional dictation errors.  Nanda Quinton, MD, [facility]    Halona Amstutz, [facility], MD  06/02/20 1925  

## 2020-05-31 NOTE — Discharge Instructions (Signed)
You were seen in the emergency room today with a cough.  Your x-ray did not show evidence of pneumonia.  Your Covid test was negative.  I am treating you with several days of steroid as well as refilling your inhaler along with antibiotics.  Please return to the emergency department any new or suddenly worsening symptoms.

## 2020-05-31 NOTE — ED Triage Notes (Signed)
Pt c/o headache with cough and vomiting x 3 days; pt states she feels sob with exertion

## 2020-06-10 ENCOUNTER — Telehealth: Payer: Self-pay | Admitting: *Deleted

## 2020-06-10 NOTE — Telephone Encounter (Signed)
-----   Message from Arnoldo Lenis, MD sent at 06/07/2020  7:53 PM EDT ----- Echo how some stiffness of the aortic valve (moderate aortic stenosis), overall in the moderate range. Just something to monitor at this time. F/u 6 month  Zandra Abts MD

## 2020-06-16 ENCOUNTER — Other Ambulatory Visit: Payer: Self-pay | Admitting: Nurse Practitioner

## 2020-06-18 NOTE — Telephone Encounter (Signed)
Pt has been notified of echo results by phone. Pt is agreeable to plan of care for 6 month follow up. I have scheduled pt to see Dr. Harl Bowie 11/24/20 @ 9:20. Pt thanked me for the call and the help. The patient has been notified of the result and verbalized understanding. All questions (if any) were answered. Julaine Hua, [place] 06/17/2020 1:41 PM

## 2020-06-18 NOTE — Telephone Encounter (Signed)
-----   Message from Arnoldo Lenis, MD sent at 06/07/2020  7:53 PM EDT ----- Echo how some stiffness of the aortic valve (moderate aortic stenosis), overall in the moderate range. Just something to monitor at this time. F/u 6 month  Zandra Abts MD

## 2020-07-14 ENCOUNTER — Ambulatory Visit
Admission: EM | Admit: 2020-07-14 | Discharge: 2020-07-14 | Disposition: A | Discharge status: Home / Self Care | Payer: 59 | Attending: Emergency Medicine | Admitting: Emergency Medicine

## 2020-07-14 ENCOUNTER — Other Ambulatory Visit: Payer: Self-pay

## 2020-07-14 DIAGNOSIS — Z1152 Encounter for screening for COVID-19: Secondary | ICD-10-CM | POA: Diagnosis not present

## 2020-07-14 NOTE — ED Triage Notes (Signed)
covid test no s/s

## 2020-07-16 LAB — NOVEL CORONAVIRUS, NAA: SARS-CoV-2, NAA: NOT DETECTED

## 2020-07-16 LAB — SARS-COV-2, NAA 2 DAY TAT

## 2020-07-21 ENCOUNTER — Ambulatory Visit: Payer: Self-pay | Admitting: Family Medicine

## 2020-08-09 ENCOUNTER — Ambulatory Visit (INDEPENDENT_AMBULATORY_CARE_PROVIDER_SITE_OTHER): Payer: Medicaid Other | Admitting: Family Medicine

## 2020-08-09 ENCOUNTER — Encounter: Payer: Self-pay | Admitting: Family Medicine

## 2020-08-09 VITALS — BP 134/76 | HR 96 | Temp 95.0°F | Ht 65.0 in | Wt 205.6 lb

## 2020-08-09 DIAGNOSIS — F32A Depression, unspecified: Secondary | ICD-10-CM

## 2020-08-09 DIAGNOSIS — Z794 Long term (current) use of insulin: Secondary | ICD-10-CM

## 2020-08-09 DIAGNOSIS — G4700 Insomnia, unspecified: Secondary | ICD-10-CM | POA: Diagnosis not present

## 2020-08-09 DIAGNOSIS — E785 Hyperlipidemia, unspecified: Secondary | ICD-10-CM | POA: Diagnosis not present

## 2020-08-09 DIAGNOSIS — E039 Hypothyroidism, unspecified: Secondary | ICD-10-CM

## 2020-08-09 DIAGNOSIS — E119 Type 2 diabetes mellitus without complications: Secondary | ICD-10-CM

## 2020-08-09 DIAGNOSIS — I10 Essential (primary) hypertension: Secondary | ICD-10-CM

## 2020-08-09 DIAGNOSIS — Z23 Encounter for immunization: Secondary | ICD-10-CM

## 2020-08-09 LAB — POCT GLYCOSYLATED HEMOGLOBIN (HGB A1C): Hemoglobin A1C: 7 % — AB (ref 4.0–5.6)

## 2020-08-09 MED ORDER — DICLOFENAC SODIUM 75 MG PO TBEC: DELAYED_RELEASE_TABLET | ORAL | 5 refills | Status: DC

## 2020-08-09 MED ORDER — INSULIN LISPRO 100 UNIT/ML ~~LOC~~ SOLN: SUBCUTANEOUS | 1 refills | Status: DC

## 2020-08-09 MED ORDER — DULERA 100-5 MCG/ACT IN AERO: 2.0000 | INHALATION_SPRAY | Freq: Two times a day (BID) | RESPIRATORY_TRACT | 1 refills | Status: DC

## 2020-08-09 MED ORDER — HYDROCHLOROTHIAZIDE 12.5 MG PO TABS
12.5000 mg | ORAL_TABLET | Freq: Every day | ORAL | 1 refills | Status: DC
Start: 2020-08-09 — End: 2020-09-27

## 2020-08-09 MED ORDER — ALBUTEROL SULFATE HFA 108 (90 BASE) MCG/ACT IN AERS: 2.0000 | INHALATION_SPRAY | Freq: Four times a day (QID) | RESPIRATORY_TRACT | 1 refills | Status: DC | PRN

## 2020-08-09 MED ORDER — OMEPRAZOLE 40 MG PO CPDR
40.0000 mg | DELAYED_RELEASE_CAPSULE | Freq: Every day | ORAL | 1 refills | Status: DC
Start: 2020-08-09 — End: 2021-01-05

## 2020-08-09 MED ORDER — LOSARTAN POTASSIUM 50 MG PO TABS
50.0000 mg | ORAL_TABLET | Freq: Every day | ORAL | 1 refills | Status: DC
Start: 2020-08-09 — End: 2020-09-27

## 2020-08-09 MED ORDER — OXAZEPAM 10 MG PO CAPS: 10.0000 mg | ORAL_CAPSULE | Freq: Every evening | ORAL | 2 refills | Status: DC | PRN

## 2020-08-09 MED ORDER — LEVOTHYROXINE SODIUM 175 MCG PO TABS: 175.0000 ug | ORAL_TABLET | Freq: Every day | ORAL | 1 refills | Status: DC

## 2020-08-09 MED ORDER — METFORMIN HCL 1000 MG PO TABS
1000.0000 mg | ORAL_TABLET | Freq: Two times a day (BID) | ORAL | 1 refills | Status: DC
Start: 2020-08-09 — End: 2020-09-27

## 2020-08-09 MED ORDER — CITALOPRAM HYDROBROMIDE 40 MG PO TABS
40.0000 mg | ORAL_TABLET | Freq: Every day | ORAL | 1 refills | Status: DC
Start: 2020-08-09 — End: 2020-09-27

## 2020-08-09 MED ORDER — INSULIN GLARGINE 100 UNIT/ML ~~LOC~~ SOLN: 80.0000 [IU] | Freq: Every day | SUBCUTANEOUS | 5 refills | Status: DC

## 2020-08-09 MED ORDER — ATORVASTATIN CALCIUM 20 MG PO TABS
20.0000 mg | ORAL_TABLET | Freq: Every day | ORAL | 1 refills | Status: DC
Start: 2020-08-09 — End: 2020-09-27

## 2020-08-09 NOTE — Progress Notes (Signed)
Patient ID: Sarah Pope, female    DOB: [birth year], 64 y.o.   MRN: [id number]   Chief Complaint  Patient presents with  . Diabetes   Subjective:    HPI   Pt here for 3 month follow up on DM. Blood sugars doing well. Checks sugar BID or more depending on how she is feeling. No foot dr or eye dr. Chyrel Pope to go [employer] and is trying to start a diet.   Pt needs refill on all meds.   F/u DM2, htn, hld, insomnia/depression/anxiety, hypothyriodism.  Checking bg not seeing any low numbers. Pt working out with a gym.   Pt used to have the 14 day monitor.  Pt stating not staying on skin very long. Tried clear bandage to help keep it on, but not staying on skin. Super glue" to try to see if the cgm.  Went to cardiology. To f/u with murmur.  And seen by Sarah Pope. And pt to recheck AS in 33mo.  When pt had covid in June '21. Feeling hearing not as good and memory concerns after covid.  Anxiety/insomnia- Doing well with oxezepam and celexa help with sleep and anxiety.  Not having any morning grogginess. Taking it nightly oxezepam, occ taking during the day.  Pt feeling overwhelmed at times taking care of 11 yr old boyfriend. After covid pandemic worsened her anxiety at times. Got her covid vaccines. Had covid illness.   Medical History Sarah Pope has a past medical history of Acute respiratory failure with hypercapnia ([place]), Allergy, Anxiety, Asthma, Bronchitis with bronchospasm, Cancer ([place]), Depression, DM type 2 (diabetes mellitus, type 2) ([place]), FATTY LIVER DISEASE (08/05/2008), GERD (gastroesophageal reflux disease), Hyperlipidemia, Hypertension, Hypothyroidism, Kidney stones, Leukocytosis, Obesity, Class III, BMI 40-49.9 (morbid obesity) ([place]), PERSONAL HX COLONIC POLYPS (09/01/2008), Seasonal allergies, and TRANSAMINASES, SERUM, ELEVATED (08/05/2008).   Outpatient Encounter Medications as of 08/09/2020  Medication Sig  . albuterol (VENTOLIN HFA) 108 (90 Base) MCG/ACT  inhaler Inhale 2 puffs into the lungs every 6 (six) hours as needed for wheezing or shortness of breath.  Marland Kitchen amantadine (SYMMETREL) 100 MG capsule Take 100 mg by mouth 2 (two) times daily.  Marland Kitchen aspirin EC 81 MG tablet Take 81 mg by mouth daily.  Marland Kitchen atorvastatin (LIPITOR) 20 MG tablet Take 1 tablet (20 mg total) by mouth daily.  . cetirizine (ZYRTEC) 10 MG tablet Take 10 mg by mouth daily.  . citalopram (CELEXA) 40 MG tablet Take 1 tablet (40 mg total) by mouth daily.  . diclofenac (VOLTAREN) 75 MG EC tablet TAKE ONE TABLET BY MOUTH TWICE daily PRN pain. TAKE WITH FOOD.  . hydrochlorothiazide (HYDRODIURIL) 12.5 MG tablet Take 1 tablet (12.5 mg total) by mouth daily.  . insulin glargine (LANTUS) 100 UNIT/ML injection Inject 0.8 mLs (80 Units total) into the skin at bedtime.  . insulin lispro (HUMALOG) 100 UNIT/ML injection Check your blood sugars at least twice daily preferably 3 times daily. Sliding scale: For a blood sugar of 121-150 give yourself 3 units; for blood sugar of 151-200 give 4 units; for blood sugar of 201-250 give 6 units; for blood sugar of 251-300 give 9 units; for blood sugar of 301-350 give 13 units; for blood glucose of 351-400 give 17 units.  Marland Kitchen levothyroxine (SYNTHROID) 175 MCG tablet Take 1 tablet (175 mcg total) by mouth daily.  Marland Kitchen losartan (COZAAR) 50 MG tablet Take 1 tablet (50 mg total) by mouth daily.  . metFORMIN (GLUCOPHAGE) 1000 MG tablet Take 1 tablet (1,000 mg  total) by mouth 2 (two) times daily with a meal.  . mometasone-formoterol (DULERA) 100-5 MCG/ACT AERO Inhale 2 puffs into the lungs 2 (two) times daily.  Marland Kitchen omeprazole (PRILOSEC) 40 MG capsule Take 1 capsule (40 mg total) by mouth daily.  Marland Kitchen oxazepam (SERAX) 10 MG capsule Take 1 capsule (10 mg total) by mouth at bedtime as needed.  . SF 5000 PLUS 1.1 % CREA dental cream 3 (three) times daily. as directed  . [DISCONTINUED] albuterol (VENTOLIN HFA) 108 (90 Base) MCG/ACT inhaler Inhale 2 puffs into the lungs every 6 (six)  hours as needed for wheezing or shortness of breath.  . [DISCONTINUED] atorvastatin (LIPITOR) 20 MG tablet Take 1 tablet (20 mg total) by mouth daily.  . [DISCONTINUED] citalopram (CELEXA) 40 MG tablet Take 1 tablet (40 mg total) by mouth daily.  . [DISCONTINUED] diclofenac (VOLTAREN) 75 MG EC tablet TAKE ONE TABLET BY MOUTH TWICE daily PRN pain. TAKE WITH FOOD.  . [DISCONTINUED] hydrochlorothiazide (HYDRODIURIL) 12.5 MG tablet TAKE ONE TABLET BY MOUTH DAILY.  . [DISCONTINUED] insulin glargine (LANTUS) 100 UNIT/ML injection Inject 0.8 mLs (80 Units total) into the skin at bedtime.  . [DISCONTINUED] insulin lispro (HUMALOG) 100 UNIT/ML injection Check your blood sugars at least twice daily preferably 3 times daily. Sliding scale: For a blood sugar of 121-150 give yourself 3 units; for blood sugar of 151-200 give 4 units; for blood sugar of 201-250 give 6 units; for blood sugar of 251-300 give 9 units; for blood sugar of 301-350 give 13 units; for blood glucose of 351-400 give 17 units.  . [DISCONTINUED] levothyroxine (SYNTHROID) 175 MCG tablet Take 1 tablet (175 mcg total) by mouth daily.  . [DISCONTINUED] losartan (COZAAR) 50 MG tablet TAKE ONE TABLET BY MOUTH DAILY.  . [DISCONTINUED] metFORMIN (GLUCOPHAGE) 1000 MG tablet Take 1 tablet (1,000 mg total) by mouth 2 (two) times daily with a meal.  . [DISCONTINUED] mometasone-formoterol (DULERA) 100-5 MCG/ACT AERO Inhale 2 puffs into the lungs 2 (two) times daily.  . [DISCONTINUED] omeprazole (PRILOSEC) 40 MG capsule Take 1 capsule (40 mg total) by mouth daily.  . [DISCONTINUED] oxazepam (SERAX) 10 MG capsule Take 1 capsule (10 mg total) by mouth at bedtime as needed.  . [DISCONTINUED] amoxicillin (AMOXIL) 500 MG capsule Take 1 capsule (500 mg total) by mouth 3 (three) times daily.  . [DISCONTINUED] doxycycline (VIBRAMYCIN) 100 MG capsule Take 1 capsule (100 mg total) by mouth 2 (two) times daily.   No facility-administered encounter medications on file  as of 08/09/2020.     Review of Systems  Constitutional: Negative for chills and fever.  HENT: Positive for hearing loss (after covid). Negative for congestion, rhinorrhea and sore throat.   Respiratory: Negative for cough, shortness of breath and wheezing.   Cardiovascular: Negative for chest pain and leg swelling.  Gastrointestinal: Negative for abdominal pain, diarrhea, nausea and vomiting.  Genitourinary: Negative for dysuria and frequency.  Musculoskeletal: Negative for arthralgias and back pain.  Skin: Negative for rash.  Neurological: Negative for dizziness, weakness and headaches.     Vitals BP 134/76   Pulse 96   Temp (!) 95 F (35 C)   Ht 5\' 5"  (1.651 m)   Wt 205 lb 9.6 oz (93.3 kg)   BMI 34.21 kg/m   Objective:   Physical Exam Vitals and nursing note reviewed.  Constitutional:      Appearance: Normal appearance.  HENT:     Head: Normocephalic and atraumatic.     Nose: Nose normal.  Mouth/Throat:     Mouth: Mucous membranes are moist.     Pharynx: Oropharynx is clear.  Eyes:     Extraocular Movements: Extraocular movements intact.     Conjunctiva/sclera: Conjunctivae normal.     Pupils: Pupils are equal, round, and reactive to light.  Cardiovascular:     Rate and Rhythm: Normal rate and regular rhythm.     Pulses: Normal pulses.     Heart sounds: Normal heart sounds.  Pulmonary:     Effort: Pulmonary effort is normal.     Breath sounds: Normal breath sounds. No wheezing, rhonchi or rales.  Musculoskeletal:        General: Normal range of motion.     Right lower leg: No edema.     Left lower leg: No edema.  Skin:    General: Skin is warm and dry.     Findings: No lesion or rash.  Neurological:     General: No focal deficit present.     Mental Status: She is alert and oriented to person, place, and time.  Psychiatric:        Mood and Affect: Mood normal.        Behavior: Behavior normal.      Assessment and Plan   1. Type 2 diabetes  mellitus without complication, with long-term current use of insulin (HCC) - POCT HgB A1C  2. Need for vaccination - Flu Vaccine QUAD 6+ mos PF IM (Fluarix Quad PF)  3. Insomnia, unspecified type - oxazepam (SERAX) 10 MG capsule; Take 1 capsule (10 mg total) by mouth at bedtime as needed.  Dispense: 30 capsule; Refill: 2  4. Depression, unspecified depression type  5. Hyperlipidemia LDL goal <100  6. Hypothyroidism, unspecified type  7. Primary hypertension   Pt going to see dermatologist soon to check skin. No numbness or burning pain in feet.  Anxiety/insomnia-stable, improving. Cont oxazepam and celexa.  htn- stable, cont meds.   DM2- a1c at 7.0, stable. Cont meds.  Hypothyroid-stable, cont meds. Last tsh 1.5 in 2/21.  F//u 20mo for refills on anxiety/insomnia meds.

## 2020-08-16 ENCOUNTER — Telehealth: Payer: Self-pay | Admitting: Family Medicine

## 2020-08-16 MED ORDER — FLUTICASONE-SALMETEROL 250-50 MCG/DOSE IN AEPB: 1.0000 | INHALATION_SPRAY | Freq: Two times a day (BID) | RESPIRATORY_TRACT | 3 refills | Status: AC

## 2020-08-16 NOTE — Telephone Encounter (Signed)
Dulera 100-5 mcg/act aerosol is not covered by patient insurance. Mometasone Furoate, Breo Ellipta, Symbicort, Advair HFA, Flovent HFA, Anoro Ellipta, Trelegy Ellipta and Stiolto Respimat are covered. Please advise. Thank you  [facility].

## 2020-08-16 NOTE — Addendum Note (Signed)
Addended by: Erven Colla on: 08/16/2020 02:39 PM   Modules accepted: Orders

## 2020-08-22 ENCOUNTER — Encounter: Payer: Self-pay | Admitting: Family Medicine

## 2020-08-30 ENCOUNTER — Emergency Department (HOSPITAL_COMMUNITY): Admission: EM | Admit: 2020-08-30 | Discharge: 2020-08-30 | Discharge status: Against Medical Advice | Payer: 59

## 2020-08-30 ENCOUNTER — Other Ambulatory Visit: Payer: Self-pay

## 2020-08-30 ENCOUNTER — Ambulatory Visit: Payer: Medicaid Other | Admitting: Family Medicine

## 2020-08-31 ENCOUNTER — Encounter: Payer: Self-pay | Admitting: Family Medicine

## 2020-08-31 ENCOUNTER — Ambulatory Visit (INDEPENDENT_AMBULATORY_CARE_PROVIDER_SITE_OTHER): Payer: Medicaid Other | Admitting: Family Medicine

## 2020-08-31 ENCOUNTER — Ambulatory Visit (HOSPITAL_COMMUNITY)
Admission: RE | Admit: 2020-08-31 | Discharge: 2020-08-31 | Disposition: A | Discharge status: Home / Self Care | Payer: 59 | Source: Ambulatory Visit | Attending: Family Medicine | Admitting: Family Medicine

## 2020-08-31 ENCOUNTER — Other Ambulatory Visit: Payer: Self-pay

## 2020-08-31 VITALS — BP 120/74 | HR 105 | Temp 98.2°F | Wt 204.4 lb

## 2020-08-31 DIAGNOSIS — N3000 Acute cystitis without hematuria: Secondary | ICD-10-CM | POA: Diagnosis not present

## 2020-08-31 DIAGNOSIS — W108XXA Fall (on) (from) other stairs and steps, initial encounter: Secondary | ICD-10-CM | POA: Diagnosis not present

## 2020-08-31 DIAGNOSIS — R300 Dysuria: Secondary | ICD-10-CM

## 2020-08-31 DIAGNOSIS — M898X6 Other specified disorders of bone, lower leg: Secondary | ICD-10-CM | POA: Insufficient documentation

## 2020-08-31 LAB — POCT URINALYSIS DIPSTICK (MANUAL)
Nitrite, UA: NEGATIVE
Poct Blood: NEGATIVE
Poct Glucose: 500 mg/dL — AB
Poct Ketones: NEGATIVE
Poct Urobilinogen: 4 mg/dL — AB
Spec Grav, UA: 1.02 (ref 1.010–1.025)
pH, UA: 6 (ref 5.0–8.0)

## 2020-08-31 MED ORDER — NITROFURANTOIN MONOHYD MACRO 100 MG PO CAPS: 100.0000 mg | ORAL_CAPSULE | Freq: Two times a day (BID) | ORAL | 0 refills | Status: DC

## 2020-08-31 NOTE — Patient Instructions (Signed)
Ice and cool compresses for pain and swelling.

## 2020-08-31 NOTE — Progress Notes (Signed)
Patient ID: Sarah Pope, female    DOB: 26-Jan-1956, 64 y.o.   MRN: [id number]   Chief Complaint  Patient presents with  . Knee Pain    Patient had a fall last week resulting in pain and swelling in her left knee.  . Urinary Tract Infection    Patient reports pressure, urgency and frequency x 3 days.    Subjective:  CC: fell one week ago, painful urination for 3 days.  Presents today with a complaint of falling 1 week ago onto a cement step onto her left knee, she reports immediate swelling, difficulty walking at that time.  For the past 3 days she has had urinary burning frequency and urgency, feels like this is a UTI.  Pertinent negatives include no fever, no chills, no rigors, no chest pain, no shortness of breath.  Has not tried anything for the urinary symptoms.  Attempted to be seen at the ED and urgent care for the left knee, they were very busy waited a long time and left before being seen.  The urgent care advised that they had no way to do an x-ray and recommended she go to the ED.    Medical History [name] has a past medical history of Acute respiratory failure with hypercapnia ([place]), Allergy, Anxiety, Asthma, Bronchitis with bronchospasm, Cancer ([place]), Depression, DM type 2 (diabetes mellitus, type 2) ([place]), FATTY LIVER DISEASE (08/05/2008), GERD (gastroesophageal reflux disease), Hyperlipidemia, Hypertension, Hypothyroidism, Kidney stones, Leukocytosis, Obesity, Class III, BMI 40-49.9 (morbid obesity) ([place]), PERSONAL HX COLONIC POLYPS (09/01/2008), Seasonal allergies, and TRANSAMINASES, SERUM, ELEVATED (08/05/2008).   Outpatient Encounter Medications as of 08/31/2020  Medication Sig  . albuterol (VENTOLIN HFA) 108 (90 Base) MCG/ACT inhaler Inhale 2 puffs into the lungs every 6 (six) hours as needed for wheezing or shortness of breath.  Marland Kitchen amantadine (SYMMETREL) 100 MG capsule Take 100 mg by mouth 2 (two) times daily.  Marland Kitchen aspirin EC 81 MG tablet Take 81 mg by mouth daily.  Marland Kitchen  atorvastatin (LIPITOR) 20 MG tablet Take 1 tablet (20 mg total) by mouth daily.  . cetirizine (ZYRTEC) 10 MG tablet Take 10 mg by mouth daily.  . citalopram (CELEXA) 40 MG tablet Take 1 tablet (40 mg total) by mouth daily.  . diclofenac (VOLTAREN) 75 MG EC tablet TAKE ONE TABLET BY MOUTH TWICE daily PRN pain. TAKE WITH FOOD.  Marland Kitchen Fluticasone-Salmeterol (ADVAIR DISKUS) 250-50 MCG/DOSE AEPB Inhale 1 puff into the lungs 2 (two) times daily.  . hydrochlorothiazide (HYDRODIURIL) 12.5 MG tablet Take 1 tablet (12.5 mg total) by mouth daily.  . insulin glargine (LANTUS) 100 UNIT/ML injection Inject 0.8 mLs (80 Units total) into the skin at bedtime.  . insulin lispro (HUMALOG) 100 UNIT/ML injection Check your blood sugars at least twice daily preferably 3 times daily. Sliding scale: For a blood sugar of 121-150 give yourself 3 units; for blood sugar of 151-200 give 4 units; for blood sugar of 201-250 give 6 units; for blood sugar of 251-300 give 9 units; for blood sugar of 301-350 give 13 units; for blood glucose of 351-400 give 17 units.  Marland Kitchen levothyroxine (SYNTHROID) 175 MCG tablet Take 1 tablet (175 mcg total) by mouth daily.  Marland Kitchen losartan (COZAAR) 50 MG tablet Take 1 tablet (50 mg total) by mouth daily.  . metFORMIN (GLUCOPHAGE) 1000 MG tablet Take 1 tablet (1,000 mg total) by mouth 2 (two) times daily with a meal.  . nitrofurantoin, macrocrystal-monohydrate, (MACROBID) 100 MG capsule Take 1 capsule (100 mg total) by  mouth 2 (two) times daily.  Marland Kitchen omeprazole (PRILOSEC) 40 MG capsule Take 1 capsule (40 mg total) by mouth daily.  Marland Kitchen oxazepam (SERAX) 10 MG capsule Take 1 capsule (10 mg total) by mouth at bedtime as needed.  . SF 5000 PLUS 1.1 % CREA dental cream 3 (three) times daily. as directed   No facility-administered encounter medications on file as of 08/31/2020.     Review of Systems  Constitutional: Negative for chills and fever.  Respiratory: Negative for shortness of breath.   Cardiovascular:  Negative for chest pain.  Gastrointestinal: Positive for abdominal pain.       Suprapubic  Genitourinary: Positive for dysuria, frequency and urgency.  Musculoskeletal:       [place] [address] 1 week ago, large hematoma left shin     Vitals BP 120/74   Pulse (!) 105   Temp 98.2 F (36.8 C)   Wt 204 lb 6.4 oz (92.7 kg)   SpO2 98%   BMI 34.01 kg/m   Results for orders placed or performed in visit on 08/31/20  POCT Urinalysis Dip Manual  Result Value Ref Range   Spec Grav, UA 1.020 1.010 - 1.025   pH, UA 6.0 5.0 - 8.0   Leukocytes, UA Moderate (2+) (A) Negative   Nitrite, UA Negative Negative   Poct Protein ++100 (A) Negative, trace mg/dL   Poct Glucose =500 (A) Normal mg/dL   Poct Ketones Negative Negative   Poct Urobilinogen =4 (A) Normal mg/dL   Poct Bilirubin ++ (A) Negative   Poct Blood Negative Negative, trace    Objective:   Physical Exam Vitals and nursing note reviewed.  Constitutional:      General: She is not in acute distress.    Appearance: Normal appearance. She is not ill-appearing.  Cardiovascular:     Rate and Rhythm: Normal rate.     Heart sounds: Murmur heard.      Comments: Followed by cardiology Pulmonary:     Effort: Pulmonary effort is normal.     Breath sounds: Normal breath sounds.  Abdominal:     Tenderness: There is abdominal tenderness in the suprapubic area. There is no right CVA tenderness or left CVA tenderness.  Skin:    General: Skin is warm and dry.  Neurological:     Mental Status: She is alert and oriented to person, place, and time.  Psychiatric:        Mood and Affect: Mood normal.        Behavior: Behavior normal.        Thought Content: Thought content normal.        Judgment: Judgment normal.      Assessment and Plan   1. Dysuria - POCT Urinalysis Dip Manual - Urine Culture  2. Fall (on) (from) other stairs and steps, initial encounter - DG Tibia/Fibula Left  3. Acute cystitis without hematuria - nitrofurantoin,  macrocrystal-monohydrate, (MACROBID) 100 MG capsule; Take 1 capsule (100 mg total) by mouth 2 (two) times daily.  Dispense: 20 capsule; Refill: 0   Fall: Fell 1 week ago onto a cement step.  Reports immediate swelling, difficulty walking at that time.  Today she still has a significant size hematoma, firm and warm to touch.  We will send over to [facility] for a stat x-ray to rule out fracture.  If negative for fracture, will treat conservatively with cool compresses. Dr. Wolfgang Phoenix consulted during this visit.   UTI: Reports urinary burning, frequency, urgency for the last 3 days.  Urine dip positive for leukocytes.  Will treat with Macrobid.  She will stay hydrated, and let me know if the Macrobid is not working.  Will send for culture.  Update: X-ray negative for fracture, nurse notified patient.  Agrees with plan of care discussed today. Understands warning signs to seek further care: Chest pain, shortness of breath, any significant changes in healthcare status. Understands to follow-up if symptoms do not improve, or worsen.  She will follow up in 1 week for her knee pain.  Pecolia Ades, FNP-C

## 2020-09-03 LAB — URINE CULTURE

## 2020-09-07 ENCOUNTER — Ambulatory Visit: Payer: Medicaid Other | Admitting: Family Medicine

## 2020-09-15 ENCOUNTER — Other Ambulatory Visit: Payer: Self-pay | Admitting: Nurse Practitioner

## 2020-09-22 ENCOUNTER — Other Ambulatory Visit (HOSPITAL_COMMUNITY): Payer: Self-pay | Admitting: Family Medicine

## 2020-09-22 DIAGNOSIS — Z1231 Encounter for screening mammogram for malignant neoplasm of breast: Secondary | ICD-10-CM

## 2020-09-27 ENCOUNTER — Other Ambulatory Visit: Payer: Self-pay

## 2020-09-27 ENCOUNTER — Encounter: Payer: Self-pay | Admitting: Family Medicine

## 2020-09-27 ENCOUNTER — Ambulatory Visit (INDEPENDENT_AMBULATORY_CARE_PROVIDER_SITE_OTHER): Payer: Medicaid Other | Admitting: Family Medicine

## 2020-09-27 VITALS — BP 128/78 | HR 91 | Temp 98.0°F | Wt 206.2 lb

## 2020-09-27 DIAGNOSIS — Z1211 Encounter for screening for malignant neoplasm of colon: Secondary | ICD-10-CM | POA: Diagnosis not present

## 2020-09-27 DIAGNOSIS — G4700 Insomnia, unspecified: Secondary | ICD-10-CM | POA: Diagnosis not present

## 2020-09-27 DIAGNOSIS — Z794 Long term (current) use of insulin: Secondary | ICD-10-CM

## 2020-09-27 DIAGNOSIS — E119 Type 2 diabetes mellitus without complications: Secondary | ICD-10-CM | POA: Diagnosis not present

## 2020-09-27 DIAGNOSIS — Z0000 Encounter for general adult medical examination without abnormal findings: Secondary | ICD-10-CM

## 2020-09-27 MED ORDER — CITALOPRAM HYDROBROMIDE 40 MG PO TABS: 40.0000 mg | ORAL_TABLET | Freq: Every day | ORAL | 1 refills | Status: DC

## 2020-09-27 MED ORDER — HYDROCHLOROTHIAZIDE 12.5 MG PO TABS: 12.5000 mg | ORAL_TABLET | Freq: Every day | ORAL | 1 refills | Status: DC

## 2020-09-27 MED ORDER — OXAZEPAM 10 MG PO CAPS: 10.0000 mg | ORAL_CAPSULE | Freq: Every evening | ORAL | 2 refills | Status: DC | PRN

## 2020-09-27 MED ORDER — DICLOFENAC SODIUM 75 MG PO TBEC: DELAYED_RELEASE_TABLET | ORAL | 5 refills | Status: DC

## 2020-09-27 MED ORDER — LEVOTHYROXINE SODIUM 175 MCG PO TABS: 175.0000 ug | ORAL_TABLET | Freq: Every day | ORAL | 1 refills | Status: DC

## 2020-09-27 MED ORDER — ATORVASTATIN CALCIUM 20 MG PO TABS: 20.0000 mg | ORAL_TABLET | Freq: Every day | ORAL | 1 refills | Status: DC

## 2020-09-27 MED ORDER — LOSARTAN POTASSIUM 50 MG PO TABS: 50.0000 mg | ORAL_TABLET | Freq: Every day | ORAL | 1 refills | Status: DC

## 2020-09-27 MED ORDER — METFORMIN HCL 1000 MG PO TABS: 1000.0000 mg | ORAL_TABLET | Freq: Two times a day (BID) | ORAL | 1 refills | Status: DC
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
## 2020-09-27 NOTE — Patient Instructions (Signed)

## 2020-10-08 ENCOUNTER — Other Ambulatory Visit: Payer: Self-pay

## 2020-10-08 ENCOUNTER — Ambulatory Visit (HOSPITAL_COMMUNITY)
Admission: RE | Admit: 2020-10-08 | Discharge: 2020-10-08 | Disposition: A | Discharge status: Home / Self Care | Payer: 59 | Source: Ambulatory Visit | Attending: Family Medicine | Admitting: Family Medicine

## 2020-10-08 DIAGNOSIS — Z1231 Encounter for screening mammogram for malignant neoplasm of breast: Secondary | ICD-10-CM | POA: Insufficient documentation

## 2020-10-18 ENCOUNTER — Telehealth: Payer: Self-pay | Admitting: Family Medicine

## 2020-10-18 NOTE — Telephone Encounter (Signed)
Ok, pls give script for freestyle libre 14 day sensor.    Thx. Dr. Ladona Ridgel

## 2020-10-18 NOTE — Telephone Encounter (Signed)
Fax from [employer] stating that [employer] 14 Day Readers Device is not covered by insurance. FreeStyle Libre 14 Day Sensor, [employer] 2 Sensor, Dexcom G6 Receiver, Dexcom G6 Transmitter and Dexcom G6 Senor are alternative covered formulary. Please advise. Thank you

## 2020-10-19 NOTE — Telephone Encounter (Signed)
done

## 2020-10-19 NOTE — Telephone Encounter (Signed)
Script written out and taped to door; will fax once signed. Please advise. Thank you

## 2020-11-24 ENCOUNTER — Encounter: Payer: Self-pay | Admitting: Cardiology

## 2020-11-24 ENCOUNTER — Ambulatory Visit (INDEPENDENT_AMBULATORY_CARE_PROVIDER_SITE_OTHER): Payer: 59 | Admitting: Cardiology

## 2020-11-24 VITALS — BP 154/80 | HR 96 | Ht 65.0 in | Wt 206.6 lb

## 2020-11-24 DIAGNOSIS — R0989 Other specified symptoms and signs involving the circulatory and respiratory systems: Secondary | ICD-10-CM | POA: Diagnosis not present

## 2020-11-24 DIAGNOSIS — I350 Nonrheumatic aortic (valve) stenosis: Secondary | ICD-10-CM

## 2020-11-24 NOTE — Patient Instructions (Addendum)
Your physician recommends that you schedule a follow-up appointment in: 6 MONTHS WITH DR BRANCH  Your physician recommends that you continue on your current medications as directed. Please refer to the Current Medication list given to you today.  Your physician has requested that you have an echocardiogram. Echocardiography is a painless test that uses sound waves to create images of your heart. It provides your doctor with information about the size and shape of your heart and how well your heart's chambers and valves are working. This procedure takes approximately one hour. There are no restrictions for this procedure.  Your physician has requested that you have a carotid duplex. This test is an ultrasound of the carotid arteries in your neck. It looks at blood flow through these arteries that supply the brain with blood. Allow one hour for this exam. There are no restrictions or special instructions.  Thank you for choosing [facility]!!    

## 2020-11-24 NOTE — Progress Notes (Addendum)
Clinical Summary Sarah Pope is a 65 y.o.female seen today for follow up of the following medical problems.   1. Heart murmur - noted at recent pcp visit - Jan 2014 echo LVEF 55-60%, mild AV and MV anular calcification  05/2020 echo LVEF 65-70%, mod to severe AS (AVA VTI 0.77 mean 30 DI 0.30) - chronic SOB she attributes to her asthma.  - goes to gym to 2 times per week. Does treadmill x 30 min at walking pace without troubles.  2. HTN - has not taken meds yet today.  Past Medical History:  Diagnosis Date  . Acute respiratory failure with hypercapnia ([place])   . Allergy   . Anxiety   . Asthma   . Bronchitis with bronchospasm   . Cancer ([place])    skin  . Depression   . DM type 2 (diabetes mellitus, type 2) ([place])   . FATTY LIVER DISEASE 08/05/2008  . GERD (gastroesophageal reflux disease)   . Hyperlipidemia   . Hypertension   . Hypothyroidism   . Kidney stones   . Leukocytosis   . Obesity, Class III, BMI 40-49.9 (morbid obesity) ([place])   . PERSONAL HX COLONIC POLYPS 09/01/2008  . Seasonal allergies   . TRANSAMINASES, SERUM, ELEVATED 08/05/2008     Allergies  Allergen Reactions  . Codeine Nausea Only  . Sulfa Antibiotics     itching     Current Outpatient Medications  Medication Sig Dispense Refill  . albuterol (VENTOLIN HFA) 108 (90 Base) MCG/ACT inhaler Inhale 2 puffs into the lungs every 6 (six) hours as needed for wheezing or shortness of breath. 18 g 1  . amantadine (SYMMETREL) 100 MG capsule Take 100 mg by mouth 2 (two) times daily.    Marland Kitchen aspirin EC 81 MG tablet Take 81 mg by mouth daily.    Marland Kitchen atorvastatin (LIPITOR) 20 MG tablet Take 1 tablet (20 mg total) by mouth daily. 90 tablet 1  . cetirizine (ZYRTEC) 10 MG tablet Take 10 mg by mouth daily.    . citalopram (CELEXA) 40 MG tablet Take 1 tablet (40 mg total) by mouth daily. 90 tablet 1  . Continuous Blood Gluc Receiver (FREESTYLE LIBRE 14 DAY READER) DEVI USE AS DIRECTED THREE TIMES DAILY. 1 each PRN  .  Continuous Blood Gluc Sensor (FREESTYLE LIBRE 14 DAY SENSOR) MISC USE AS DIRECTED THREE TIMES DAILY WITH SLIDING SCALE INSULIN USE. 2 each PRN  . diclofenac (VOLTAREN) 75 MG EC tablet TAKE ONE TABLET BY MOUTH TWICE daily PRN pain. TAKE WITH FOOD. 28 tablet 5  . Fluticasone-Salmeterol (ADVAIR DISKUS) 250-50 MCG/DOSE AEPB Inhale 1 puff into the lungs 2 (two) times daily. 1 each 3  . hydrochlorothiazide (HYDRODIURIL) 12.5 MG tablet Take 1 tablet (12.5 mg total) by mouth daily. 90 tablet 1  . insulin glargine (LANTUS) 100 UNIT/ML injection Inject 0.8 mLs (80 Units total) into the skin at bedtime. 5 mL 5  . insulin lispro (HUMALOG) 100 UNIT/ML injection Check your blood sugars at least twice daily preferably 3 times daily. Sliding scale: For a blood sugar of 121-150 give yourself 3 units; for blood sugar of 151-200 give 4 units; for blood sugar of 201-250 give 6 units; for blood sugar of 251-300 give 9 units; for blood sugar of 301-350 give 13 units; for blood glucose of 351-400 give 17 units. 10 mL 1  . levothyroxine (SYNTHROID) 175 MCG tablet Take 1 tablet (175 mcg total) by mouth daily. 90 tablet 1  . losartan (COZAAR)  50 MG tablet Take 1 tablet (50 mg total) by mouth daily. 90 tablet 1  . metFORMIN (GLUCOPHAGE) 1000 MG tablet Take 1 tablet (1,000 mg total) by mouth 2 (two) times daily with a meal. 180 tablet 1  . omeprazole (PRILOSEC) 40 MG capsule Take 1 capsule (40 mg total) by mouth daily. 90 capsule 1  . oxazepam (SERAX) 10 MG capsule Take 1 capsule (10 mg total) by mouth at bedtime as needed. 30 capsule 2  . SF 5000 PLUS 1.1 % CREA dental cream 3 (three) times daily. as directed  99   No current facility-administered medications for this visit.     Past Surgical History:  Procedure Laterality Date  . COLONOSCOPY  2013   Repeat 10 years  . MANDIBLE SURGERY  1988   realignment  . NOSE SURGERY  2007   sinus infections  . POLYPECTOMY    . skin cancer removal    . TONSILLECTOMY        Allergies  Allergen Reactions  . Codeine Nausea Only  . Sulfa Antibiotics     itching      Family History  Problem Relation Age of Onset  . Allergies Daughter   . Breast cancer Mother   . Diabetes Mother   . Prostate cancer Father   . Heart attack Maternal Grandmother   . Colon cancer Neg Hx   . Esophageal cancer Neg Hx   . Rectal cancer Neg Hx   . Stomach cancer Neg Hx   . Colon polyps Neg Hx      Social History Ms. Jeanmarie reports that she quit smoking about 38 years ago. Her smoking use included cigarettes. She has a 2.50 pack-year smoking history. She has never used smokeless tobacco. Ms. Sieh reports no history of alcohol use.   Review of Systems CONSTITUTIONAL: No weight loss, fever, chills, weakness or fatigue.  HEENT: Eyes: No visual loss, blurred vision, double vision or yellow sclerae.No hearing loss, sneezing, congestion, runny nose or sore throat.  SKIN: No rash or itching.  CARDIOVASCULAR: per hpi RESPIRATORY: No shortness of breath, cough or sputum.  GASTROINTESTINAL: No anorexia, nausea, vomiting or diarrhea. No abdominal pain or blood.  GENITOURINARY: No burning on urination, no polyuria NEUROLOGICAL: No headache, dizziness, syncope, paralysis, ataxia, numbness or tingling in the extremities. No change in bowel or bladder control.  MUSCULOSKELETAL: No muscle, back pain, joint pain or stiffness.  LYMPHATICS: No enlarged nodes. No history of splenectomy.  PSYCHIATRIC: No history of depression or anxiety.  ENDOCRINOLOGIC: No reports of sweating, cold or heat intolerance. No polyuria or polydipsia.  Marland Kitchen   Physical Examination Today's Vitals   11/24/20 0908  BP: (!) 154/80  Pulse: 96  SpO2: 95%  Weight: 206 lb 9.6 oz (93.7 kg)  Height: 5\' 5"  (1.651 m)   Body mass index is 34.38 kg/m.  Gen: resting comfortably, no acute distress HEENT: no scleral icterus, pupils equal round and reactive, no palptable cervical adenopathy,  CV: RRR, 3/6 systolic  murmur rusb, bilateral carotid bruits Resp: Clear to auscultation bilaterally GI: abdomen is soft, non-tender, non-distended, normal bowel sounds, no hepatosplenomegaly MSK: extremities are warm, no edema.  Skin: warm, no rash Neuro:  no focal deficits Psych: appropriate affect   Diagnostic Studies  05/2020 echo IMPRESSIONS    1. Left ventricular ejection fraction, by estimation, is 65 to 70%. The  left ventricle has normal function. The left ventricle has no regional  wall motion abnormalities. There is mild left ventricular hypertrophy.  Left ventricular diastolic parameters  are consistent with Grade I diastolic dysfunction (impaired relaxation).  2. Right ventricular systolic function is normal. The right ventricular  size is normal. There is normal pulmonary artery systolic pressure. The  estimated right ventricular systolic pressure is 0000000 mmHg.  3. The mitral valve is grossly normal. Trivial mitral valve  regurgitation.  4. The aortic valve is tricuspid, moderately calcified with decreased  cusp excursion. Aortic valve regurgitation is trivial. Moderate to severe  aortic valve stenosis. Aortic valve area, by VTI measures 0.77 cm. Aortic  valve mean gradient measures 29.5  mmHg. Aortic valve Vmax measures 3.64 m/s. Dimentionless index 0.30.  5. The inferior vena cava is normal in size with greater than 50%  respiratory variability, suggesting right atrial pressure of 3 mmHg.    Assessment and Plan   1. Aortic stenosis - mod to severe by last echo. Denies any symptoms - repeat echo at 6 months given she was borderline severe at last check, fairly rapid progression given she had a normal echo in 2014 - denies significant symptoms. If progressed to severe by echo would consider GXT to objectivley assess functional status  2. Carotid bruits - obtain carotid US.      Arnoldo Lenis, M.D.

## 2020-12-09 ENCOUNTER — Ambulatory Visit (INDEPENDENT_AMBULATORY_CARE_PROVIDER_SITE_OTHER): Payer: 59

## 2020-12-09 DIAGNOSIS — R0989 Other specified symptoms and signs involving the circulatory and respiratory systems: Secondary | ICD-10-CM | POA: Diagnosis not present

## 2020-12-09 DIAGNOSIS — I350 Nonrheumatic aortic (valve) stenosis: Secondary | ICD-10-CM

## 2020-12-09 LAB — ECHOCARDIOGRAM COMPLETE
AR max vel: 0.9 cm2
AV Area VTI: 0.92 cm2
AV Area mean vel: 0.85 cm2
AV Mean grad: 29.6 mmHg
AV Peak grad: 49.4 mmHg
Ao pk vel: 3.52 m/s
Area-P 1/2: 4.86 cm2
Calc EF: 59.2 %
S' Lateral: 3.1 cm
Single Plane A2C EF: 58.3 %
Single Plane A4C EF: 62.6 %

## 2020-12-29 ENCOUNTER — Telehealth: Payer: Self-pay | Admitting: *Deleted

## 2020-12-29 NOTE — Telephone Encounter (Signed)
-----   Message from Arnoldo Lenis, MD sent at 12/27/2020  7:00 PM EST ----- Aortic valve remains moderately to severely stiffened, overlal stable, Continue to monitor   Zandra Abts MD

## 2020-12-31 NOTE — Telephone Encounter (Signed)
Carotid US shows just mild blockage right side, we will continue to monitor.   Zandra Abts MD

## 2021-01-04 NOTE — Telephone Encounter (Signed)
Pt voiced understanding

## 2021-01-05 ENCOUNTER — Other Ambulatory Visit: Payer: Self-pay | Admitting: Family Medicine

## 2021-01-05 NOTE — Telephone Encounter (Signed)
Wellness 09/27/20

## 2021-01-20 ENCOUNTER — Encounter: Payer: Self-pay | Admitting: Gastroenterology

## 2021-02-16 ENCOUNTER — Other Ambulatory Visit: Payer: Self-pay | Admitting: Family Medicine

## 2021-02-16 DIAGNOSIS — G4700 Insomnia, unspecified: Secondary | ICD-10-CM

## 2021-02-22 ENCOUNTER — Other Ambulatory Visit: Payer: Self-pay

## 2021-02-22 ENCOUNTER — Ambulatory Visit (INDEPENDENT_AMBULATORY_CARE_PROVIDER_SITE_OTHER): Payer: Medicaid Other

## 2021-02-22 ENCOUNTER — Ambulatory Visit (INDEPENDENT_AMBULATORY_CARE_PROVIDER_SITE_OTHER): Payer: Medicaid Other | Admitting: Podiatry

## 2021-02-22 DIAGNOSIS — M21612 Bunion of left foot: Secondary | ICD-10-CM

## 2021-02-22 DIAGNOSIS — B351 Tinea unguium: Secondary | ICD-10-CM | POA: Diagnosis not present

## 2021-02-22 DIAGNOSIS — E1149 Type 2 diabetes mellitus with other diabetic neurological complication: Secondary | ICD-10-CM | POA: Diagnosis not present

## 2021-02-22 DIAGNOSIS — M79675 Pain in left toe(s): Secondary | ICD-10-CM | POA: Diagnosis not present

## 2021-02-22 DIAGNOSIS — M21611 Bunion of right foot: Secondary | ICD-10-CM

## 2021-02-22 DIAGNOSIS — M79674 Pain in right toe(s): Secondary | ICD-10-CM

## 2021-02-22 DIAGNOSIS — M79671 Pain in right foot: Secondary | ICD-10-CM

## 2021-02-22 DIAGNOSIS — R0989 Other specified symptoms and signs involving the circulatory and respiratory systems: Secondary | ICD-10-CM

## 2021-02-22 MED ORDER — CICLOPIROX 8 % EX SOLN: Freq: Every day | CUTANEOUS | 2 refills | Status: DC

## 2021-02-22 NOTE — Progress Notes (Signed)
Subjective:   Patient ID: August Albino, female   DOB: 65 y.o.   MRN: [id number]   HPI 65 year old female presents the office today for concerns of nail fungus and also she states that her toes feel 'stiff'.  She has also noticed purple discoloration to the toes.  Also states that she times her feet feel cold.  She gets cramps at nighttime but not with ambulation.  She has no other claudication symptoms.  She denies any ulcerations.  She said no recent treatment.  No recent injury or falls.  No numbness or tingling she reports.  Last A1c was 7.0 on 08/09/2020  Review of Systems  All other systems reviewed and are negative.  Past Medical History:  Diagnosis Date  . Acute respiratory failure with hypercapnia ([place])   . Allergy   . Anxiety   . Asthma   . Bronchitis with bronchospasm   . Cancer ([place])    skin  . Depression   . DM type 2 (diabetes mellitus, type 2) ([place])   . FATTY LIVER DISEASE 08/05/2008  . GERD (gastroesophageal reflux disease)   . Hyperlipidemia   . Hypertension   . Hypothyroidism   . Kidney stones   . Leukocytosis   . Obesity, Class III, BMI 40-49.9 (morbid obesity) ([place])   . PERSONAL HX COLONIC POLYPS 09/01/2008  . Seasonal allergies   . TRANSAMINASES, SERUM, ELEVATED 08/05/2008    Past Surgical History:  Procedure Laterality Date  . COLONOSCOPY  2013   Repeat 10 years  . MANDIBLE SURGERY  1988   realignment  . NOSE SURGERY  2007   sinus infections  . POLYPECTOMY    . skin cancer removal    . TONSILLECTOMY       Current Outpatient Medications:  .  ciclopirox (PENLAC) 8 % solution, Apply topically at bedtime. Apply over nail and surrounding skin. Apply daily over previous coat. After seven (7) days, may remove with alcohol and continue cycle., Disp: 6.6 mL, Rfl: 2 .  albuterol (VENTOLIN HFA) 108 (90 Base) MCG/ACT inhaler, INHALE TWO PUFFS INTO THE LUNGS EVERY 6 HOURS AS NEEDED FOR WHEEZING OR SHORTNESS OF BREATH, Disp: 18 g, Rfl: 1 .  amantadine  (SYMMETREL) 100 MG capsule, Take 100 mg by mouth 2 (two) times daily., Disp: , Rfl:  .  aspirin EC 81 MG tablet, Take 81 mg by mouth daily., Disp: , Rfl:  .  atorvastatin (LIPITOR) 20 MG tablet, Take 1 tablet (20 mg total) by mouth daily., Disp: 90 tablet, Rfl: 1 .  cetirizine (ZYRTEC) 10 MG tablet, Take 10 mg by mouth daily., Disp: , Rfl:  .  citalopram (CELEXA) 40 MG tablet, Take 1 tablet (40 mg total) by mouth daily., Disp: 90 tablet, Rfl: 1 .  Continuous Blood Gluc Receiver (FREESTYLE LIBRE 14 DAY READER) DEVI, USE AS DIRECTED THREE TIMES DAILY., Disp: 1 each, Rfl: PRN .  Continuous Blood Gluc Sensor (FREESTYLE LIBRE 14 DAY SENSOR) MISC, USE AS DIRECTED THREE TIMES DAILY WITH SLIDING SCALE INSULIN USE., Disp: 2 each, Rfl: PRN .  diclofenac (VOLTAREN) 75 MG EC tablet, TAKE ONE TABLET BY MOUTH TWICE daily PRN pain. TAKE WITH FOOD., Disp: 28 tablet, Rfl: 5 .  Fluticasone-Salmeterol (ADVAIR DISKUS) 250-50 MCG/DOSE AEPB, Inhale 1 puff into the lungs 2 (two) times daily., Disp: 1 each, Rfl: 3 .  hydrochlorothiazide (HYDRODIURIL) 12.5 MG tablet, Take 1 tablet (12.5 mg total) by mouth daily., Disp: 90 tablet, Rfl: 1 .  insulin glargine (LANTUS) 100 UNIT/ML injection,  Inject 0.8 mLs (80 Units total) into the skin at bedtime., Disp: 5 mL, Rfl: 5 .  insulin lispro (HUMALOG) 100 UNIT/ML injection, Check your blood sugars at least twice daily preferably 3 times daily. Sliding scale: For a blood sugar of 121-150 give yourself 3 units; for blood sugar of 151-200 give 4 units; for blood sugar of 201-250 give 6 units; for blood sugar of 251-300 give 9 units; for blood sugar of 301-350 give 13 units; for blood glucose of 351-400 give 17 units., Disp: 10 mL, Rfl: 1 .  levothyroxine (SYNTHROID) 175 MCG tablet, Take 1 tablet (175 mcg total) by mouth daily., Disp: 90 tablet, Rfl: 1 .  losartan (COZAAR) 50 MG tablet, Take 1 tablet (50 mg total) by mouth daily., Disp: 90 tablet, Rfl: 1 .  metFORMIN (GLUCOPHAGE) 1000 MG  tablet, Take 1 tablet (1,000 mg total) by mouth 2 (two) times daily with a meal., Disp: 180 tablet, Rfl: 1 .  omeprazole (PRILOSEC) 40 MG capsule, TAKE ONE CAPSULE BY MOUTH DAILY, Disp: 90 capsule, Rfl: 1 .  oxazepam (SERAX) 10 MG capsule, Take 1 capsule (10 mg total) by mouth at bedtime as needed., Disp: 30 capsule, Rfl: 2 .  SF 5000 PLUS 1.1 % CREA dental cream, 3 (three) times daily. as directed, Disp: , Rfl: 99  Allergies  Allergen Reactions  . Codeine Nausea Only  . Sulfa Antibiotics     itching        Objective:  Physical Exam  General: AAO x3, NAD  Dermatological: Nails are hypertrophic, dystrophic, brittle, discolored, elongated 10. No surrounding redness or drainage. Tenderness nails 1-5 bilaterally. No open lesions or pre-ulcerative lesions are identified today.  Vascular: Dorsalis Pedis artery and Posterior Tibial artery pedal pulses are palpable bilateral.  Mild decrease in capillary refill time.  There is no pain with calf compression, swelling, warmth, erythema.   Neruologic: Grossly intact via light touch bilateral.  Sensation overall intact with Semmes Weinstein monofilament  Musculoskeletal: Bunion is present.  There is no erythema point tenderness.  Unable to move the MPJ range of motion without any significant discomfort or restrictions to the lesser MPJs.  Slight restriction of first MPJ.  Muscular strength 5/5 in all groups tested bilateral.  Gait: Unassisted, Nonantalgic.       Assessment:   65 year old female with symptomatic onychomycosis, toe discoloration/pain    Plan:  -Treatment options discussed including all alternatives, risks, and complications -Etiology of symptoms were discussed -X-rays obtained reviewed.  Bunion deformities present.  No evidence of acute fracture.  Mild arthritic changes present the first MPJ. -Nails debrided 10 without complications or bleeding.  Prescribed Penlac for nail fungus -Ordered ABIs. -Discussed the toe  discomfort and also the possible neuropathy.  We will further evaluate that pending the ABIs. -She was interested in steroid injection however given the color changes today were to hold off on this until the ABIs are completed. -Daily foot inspection -Follow-up in 3 months or sooner if any problems arise. In the meantime, encouraged to call the office with any questions, concerns, change in symptoms.   Celesta Gentile, DPM

## 2021-03-02 ENCOUNTER — Other Ambulatory Visit: Payer: Self-pay

## 2021-03-02 ENCOUNTER — Ambulatory Visit (HOSPITAL_COMMUNITY)
Admission: RE | Admit: 2021-03-02 | Discharge: 2021-03-02 | Disposition: A | Discharge status: Home / Self Care | Payer: 59 | Source: Ambulatory Visit | Attending: Podiatry | Admitting: Podiatry

## 2021-03-02 DIAGNOSIS — R0989 Other specified symptoms and signs involving the circulatory and respiratory systems: Secondary | ICD-10-CM | POA: Diagnosis present

## 2021-03-03 ENCOUNTER — Telehealth: Payer: Self-pay | Admitting: *Deleted

## 2021-03-03 NOTE — Telephone Encounter (Signed)
Called and left a message for the patient and relayed the message per Dr Jacqualyn Posey and stated to call the office if any concerns or questions. Lattie Haw

## 2021-03-03 NOTE — Telephone Encounter (Signed)
-----   Message from Trula Slade, DPM sent at 03/03/2021  7:53 AM EDT ----- Lattie Haw- please let her know that the circulation test was normal. Thanks!

## 2021-03-17 ENCOUNTER — Telehealth: Payer: Self-pay | Admitting: Family Medicine

## 2021-03-17 DIAGNOSIS — G4700 Insomnia, unspecified: Secondary | ICD-10-CM

## 2021-04-01 NOTE — Telephone Encounter (Signed)
Appointment scheduled 04/18/21.

## 2021-04-18 ENCOUNTER — Other Ambulatory Visit: Payer: Self-pay

## 2021-04-18 ENCOUNTER — Encounter: Payer: Self-pay | Admitting: Family Medicine

## 2021-04-18 ENCOUNTER — Ambulatory Visit: Payer: Medicaid Other | Admitting: Family Medicine

## 2021-04-18 VITALS — BP 110/67 | HR 83 | Temp 98.2°F | Ht 65.0 in | Wt 201.6 lb

## 2021-04-18 DIAGNOSIS — I350 Nonrheumatic aortic (valve) stenosis: Secondary | ICD-10-CM | POA: Diagnosis not present

## 2021-04-18 DIAGNOSIS — E119 Type 2 diabetes mellitus without complications: Secondary | ICD-10-CM

## 2021-04-18 DIAGNOSIS — E039 Hypothyroidism, unspecified: Secondary | ICD-10-CM

## 2021-04-18 DIAGNOSIS — I10 Essential (primary) hypertension: Secondary | ICD-10-CM | POA: Diagnosis not present

## 2021-04-18 DIAGNOSIS — Z79891 Long term (current) use of opiate analgesic: Secondary | ICD-10-CM

## 2021-04-18 DIAGNOSIS — Z794 Long term (current) use of insulin: Secondary | ICD-10-CM

## 2021-04-18 DIAGNOSIS — G4700 Insomnia, unspecified: Secondary | ICD-10-CM

## 2021-04-18 MED ORDER — ATORVASTATIN CALCIUM 20 MG PO TABS: 20.0000 mg | ORAL_TABLET | Freq: Every day | ORAL | 1 refills | Status: AC

## 2021-04-18 MED ORDER — OMEPRAZOLE 40 MG PO CPDR: 40.0000 mg | DELAYED_RELEASE_CAPSULE | Freq: Every day | ORAL | 1 refills | Status: DC

## 2021-04-18 MED ORDER — INSULIN GLARGINE 100 UNIT/ML ~~LOC~~ SOLN: 80.0000 [IU] | Freq: Every day | SUBCUTANEOUS | 5 refills | Status: DC

## 2021-04-18 MED ORDER — DICLOFENAC SODIUM 75 MG PO TBEC: DELAYED_RELEASE_TABLET | ORAL | 5 refills | Status: DC

## 2021-04-18 MED ORDER — HYDROCHLOROTHIAZIDE 12.5 MG PO TABS: 12.5000 mg | ORAL_TABLET | Freq: Every day | ORAL | 1 refills | Status: DC

## 2021-04-18 MED ORDER — INSULIN LISPRO 100 UNIT/ML ~~LOC~~ SOLN: SUBCUTANEOUS | 1 refills | Status: DC

## 2021-04-18 MED ORDER — LEVOTHYROXINE SODIUM 175 MCG PO TABS: 175.0000 ug | ORAL_TABLET | Freq: Every day | ORAL | 1 refills | Status: DC

## 2021-04-18 MED ORDER — CITALOPRAM HYDROBROMIDE 40 MG PO TABS: 40.0000 mg | ORAL_TABLET | Freq: Every day | ORAL | 1 refills | Status: AC

## 2021-04-18 MED ORDER — OXAZEPAM 10 MG PO CAPS: 10.0000 mg | ORAL_CAPSULE | Freq: Every evening | ORAL | 0 refills | Status: DC | PRN

## 2021-04-18 MED ORDER — METFORMIN HCL 1000 MG PO TABS: 1000.0000 mg | ORAL_TABLET | Freq: Two times a day (BID) | ORAL | 1 refills | Status: DC

## 2021-04-18 MED ORDER — LOSARTAN POTASSIUM 50 MG PO TABS: 50.0000 mg | ORAL_TABLET | Freq: Every day | ORAL | 0 refills | Status: DC

## 2021-04-18 MED ORDER — INSULIN GLARGINE 100 UNITS/ML SOLOSTAR PEN: 80.0000 [IU] | PEN_INJECTOR | Freq: Every day | SUBCUTANEOUS | 2 refills | Status: DC

## 2021-04-18 NOTE — Progress Notes (Signed)
Patient ID: Sarah Pope, female    DOB: 1956-07-24, 65 y.o.   MRN: [id number]   Chief Complaint  Patient presents with   Diabetes   Subjective:    HPI F/u dm2, htn, hypothyroid, insomnia, depression- med check up. Takes meds every day, states blood sugar has been running good. Needs refills on all meds.  Fasting am bg- seeing under 150s. Eye- exam- over due. No h/o per pt of retinopathy. Foot- seeing podiatry- antifungal for toe nails topical.   Using adviari and albuterol- Using advair more.  Hypothyroidism- Not having any side effects from thyroid meds.  Compliant with meds. No diarrhea, constipation, depression/anxiety, palpitations, excessive hair loss or weight gain/loss. No heat/cold intolerance.  Taking oxazepam 3x per week.  Not daily.  Last script was 03/18/21, has a few left.  Last echo 2/22.  [place]  Dm2- Compliant with medications. Checking blood glucose.   Not seeing any high or low numbers.  Denies polyuria or polydipsia.  Eye exam: overdue ,pt to call for appt Foot exam: no new concerns  HTN Pt compliant with BP meds.  No SEs Denies chest pain, sob, LE swelling, or blurry vision.   Medical History [name] has a past medical history of Acute respiratory failure with hypercapnia ([place]), Allergy, Anxiety, Asthma, Bronchitis with bronchospasm, Cancer ([place]), Depression, DM type 2 (diabetes mellitus, type 2) ([place]), FATTY LIVER DISEASE (08/05/2008), GERD (gastroesophageal reflux disease), Hyperlipidemia, Hypertension, Hypothyroidism, Kidney stones, Leukocytosis, Obesity, Class III, BMI 40-49.9 (morbid obesity) ([place]), PERSONAL HX COLONIC POLYPS (09/01/2008), Seasonal allergies, and TRANSAMINASES, SERUM, ELEVATED (08/05/2008).   Outpatient Encounter Medications as of 04/18/2021  Medication Sig   albuterol (VENTOLIN HFA) 108 (90 Base) MCG/ACT inhaler INHALE TWO PUFFS INTO THE LUNGS EVERY 6 HOURS AS NEEDED FOR WHEEZING OR  SHORTNESS OF BREATH   amantadine (SYMMETREL) 100 MG capsule Take 100 mg by mouth 2 (two) times daily.   aspirin EC 81 MG tablet Take 81 mg by mouth daily.   cetirizine (ZYRTEC) 10 MG tablet Take 10 mg by mouth daily.   ciclopirox (PENLAC) 8 % solution Apply topically at bedtime. Apply over nail and surrounding skin. Apply daily over previous coat. After seven (7) days, may remove with alcohol and continue cycle.   Continuous Blood Gluc Receiver (FREESTYLE LIBRE 14 DAY READER) DEVI USE AS DIRECTED THREE TIMES DAILY.   Continuous Blood Gluc Sensor (FREESTYLE LIBRE 14 DAY SENSOR) MISC USE AS DIRECTED THREE TIMES DAILY WITH SLIDING SCALE INSULIN USE.   Fluticasone-Salmeterol (ADVAIR DISKUS) 250-50 MCG/DOSE AEPB Inhale 1 puff into the lungs 2 (two) times daily.   insulin glargine (LANTUS) 100 unit/mL SOPN Inject 80 Units into the skin daily.   SF 5000 PLUS 1.1 % CREA dental cream 3 (three) times daily. as directed   [DISCONTINUED] atorvastatin (LIPITOR) 20 MG tablet Take 1 tablet (20 mg total) by mouth daily.   [DISCONTINUED] citalopram (CELEXA) 40 MG tablet Take 1 tablet (40 mg total) by mouth daily.   [DISCONTINUED] diclofenac (VOLTAREN) 75 MG EC tablet TAKE ONE TABLET BY MOUTH TWICE daily PRN pain. TAKE WITH FOOD.   [DISCONTINUED] hydrochlorothiazide (HYDRODIURIL) 12.5 MG tablet Take 1 tablet (12.5 mg total) by mouth daily.   [DISCONTINUED] insulin glargine (LANTUS) 100 UNIT/ML injection Inject 0.8 mLs (80 Units total) into the skin at bedtime.   [DISCONTINUED] insulin lispro (HUMALOG) 100 UNIT/ML injection Check your blood sugars at least twice daily preferably 3 times daily. Sliding scale: For a blood sugar of 121-150 give  yourself 3 units; for blood sugar of 151-200 give 4 units; for blood sugar of 201-250 give 6 units; for blood sugar of 251-300 give 9 units; for blood sugar of 301-350 give 13 units; for blood glucose of 351-400 give 17 units.   [DISCONTINUED] levothyroxine (SYNTHROID) 175 MCG  tablet Take 1 tablet (175 mcg total) by mouth daily.   [DISCONTINUED] losartan (COZAAR) 50 MG tablet Take 1 tablet (50 mg total) by mouth daily.   [DISCONTINUED] metFORMIN (GLUCOPHAGE) 1000 MG tablet Take 1 tablet (1,000 mg total) by mouth 2 (two) times daily with a meal.   [DISCONTINUED] omeprazole (PRILOSEC) 40 MG capsule TAKE ONE CAPSULE BY MOUTH DAILY   [DISCONTINUED] oxazepam (SERAX) 10 MG capsule TAKE ONE CAPSULE BY MOUTH AT BEDTIME AS NEEDED   atorvastatin (LIPITOR) 20 MG tablet Take 1 tablet (20 mg total) by mouth daily.   citalopram (CELEXA) 40 MG tablet Take 1 tablet (40 mg total) by mouth daily.   diclofenac (VOLTAREN) 75 MG EC tablet TAKE ONE TABLET BY MOUTH TWICE daily PRN pain. TAKE WITH FOOD.   hydrochlorothiazide (HYDRODIURIL) 12.5 MG tablet Take 1 tablet (12.5 mg total) by mouth daily.   insulin lispro (HUMALOG) 100 UNIT/ML injection Check your blood sugars at least twice daily preferably 3 times daily. Sliding scale: For a blood sugar of 121-150 give yourself 3 units; for blood sugar of 151-200 give 4 units; for blood sugar of 201-250 give 6 units; for blood sugar of 251-300 give 9 units; for blood sugar of 301-350 give 13 units; for blood glucose of 351-400 give 17 units.   levothyroxine (SYNTHROID) 175 MCG tablet Take 1 tablet (175 mcg total) by mouth daily.   losartan (COZAAR) 50 MG tablet Take 1 tablet (50 mg total) by mouth daily. Needs labs.   metFORMIN (GLUCOPHAGE) 1000 MG tablet Take 1 tablet (1,000 mg total) by mouth 2 (two) times daily with a meal.   omeprazole (PRILOSEC) 40 MG capsule Take 1 capsule (40 mg total) by mouth daily.   oxazepam (SERAX) 10 MG capsule Take 1 capsule (10 mg total) by mouth at bedtime as needed.   [DISCONTINUED] insulin glargine (LANTUS) 100 UNIT/ML injection Inject 0.8 mLs (80 Units total) into the skin at bedtime.   [DISCONTINUED] oxazepam (SERAX) 10 MG capsule Take 1 capsule (10 mg total) by mouth at bedtime as needed.   [DISCONTINUED]  oxazepam (SERAX) 10 MG capsule Take 1 capsule (10 mg total) by mouth at bedtime as needed.   No facility-administered encounter medications on file as of 04/18/2021.     Review of Systems  Constitutional:  Negative for chills and fever.  HENT:  Negative for congestion, rhinorrhea and sore throat.   Respiratory:  Negative for cough, shortness of breath and wheezing.   Cardiovascular:  Negative for chest pain and leg swelling.  Gastrointestinal:  Negative for abdominal pain, diarrhea, nausea and vomiting.  Genitourinary:  Negative for dysuria and frequency.  Musculoskeletal:  Negative for arthralgias and back pain.  Skin:  Negative for rash.  Neurological:  Negative for dizziness, weakness and headaches.    Vitals BP 110/67   Pulse 83   Temp 98.2 F (36.8 C)   Ht '5\' 5"'  (1.651 m)   Wt 201 lb 9.6 oz (91.4 kg)   SpO2 97%   BMI 33.55 kg/m   Objective:   Physical Exam Vitals and nursing note reviewed.  Constitutional:      Appearance: Normal appearance.  HENT:     Head: Normocephalic and atraumatic.  Nose: Nose normal.     Mouth/Throat:     Mouth: Mucous membranes are moist.     Pharynx: Oropharynx is clear.  Eyes:     Extraocular Movements: Extraocular movements intact.     Conjunctiva/sclera: Conjunctivae normal.     Pupils: Pupils are equal, round, and reactive to light.  Cardiovascular:     Rate and Rhythm: Normal rate and regular rhythm.     Pulses: Normal pulses.     Heart sounds: Murmur (best heard LSB, 4/6) heard.  Pulmonary:     Effort: Pulmonary effort is normal.     Breath sounds: Normal breath sounds. No wheezing, rhonchi or rales.  Musculoskeletal:        General: Normal range of motion.     Right lower leg: No edema.     Left lower leg: No edema.  Skin:    General: Skin is warm and dry.     Findings: No lesion or rash.  Neurological:     General: No focal deficit present.     Mental Status: She is alert and oriented to person, place, and time.   Psychiatric:        Mood and Affect: Mood normal.        Behavior: Behavior normal.    Assessment and Plan   1. Type 2 diabetes mellitus without complication, with long-term current use of insulin (HCC) - CMP14+EGFR - Hemoglobin A1c - Microalbumin, urine  2. Primary hypertension - CBC - CMP14+EGFR - Lipid panel  3. Hypothyroidism, unspecified type - TSH  4. Aortic valve stenosis, etiology of cardiac valve disease unspecified  5. Encounter for long-term opiate analgesic use - ToxASSURE Select 13 (MW), Urine  6. Insomnia, unspecified type - oxazepam (SERAX) 10 MG capsule; Take 1 capsule (10 mg total) by mouth at bedtime as needed.  Dispense: 30 capsule; Refill: 2   Dm2- recheck labs.  A1c 9.4, not controlled.  Pt needing to dec carbs in diet. Pt stating missed some doses of her meds and will make sure to get more consistent.  Recheck in 64month.  Htn-stable. Cont meds.  Hypothyroidism- elevated tsh, pt missed some doses.  Pt to get back to taking daily and recheck next visit.   Insomnia- Pt taking serax for insomnia for years.  Stable.  Reviewed with pt concerns of long term use.  Pt voiced understanding and wishing to continue with the serax. Pmp reviewed, controlled agreement signed.  Uds ordered. Gave 1 mo supply of serax in meantime till uds results.  Medications refilled.  Pt to f/u cardiology for AS for repeat echo or if having any symptoms.  Return in about 6 months (around 10/18/2021) for f/u htn, dm2, insomia..  Addendum- pt tox screen as expected.  Will refill for 3 month supply of serax.    05/07/2021

## 2021-04-19 LAB — LIPID PANEL
Chol/HDL Ratio: 3.7 ratio (ref 0.0–4.4)
Cholesterol, Total: 165 mg/dL (ref 100–199)
HDL: 45 mg/dL (ref >39)
LDL Chol Calc (NIH): 92 mg/dL (ref 0–99)
Triglycerides: 159 mg/dL — ABNORMAL HIGH (ref 0–149)
VLDL Cholesterol Cal: 28 mg/dL (ref 5–40)

## 2021-04-19 LAB — HEMOGLOBIN A1C
Est. average glucose Bld gHb Est-mCnc: 223 mg/dL
Hgb A1c MFr Bld: 9.4 % — ABNORMAL HIGH (ref 4.8–5.6)

## 2021-04-19 LAB — CMP14+EGFR
ALT: 17 IU/L (ref 0–32)
AST: 13 IU/L (ref 0–40)
Albumin/Globulin Ratio: 1.8 (ref 1.2–2.2)
Albumin: 4.4 g/dL (ref 3.8–4.8)
Alkaline Phosphatase: 115 IU/L (ref 44–121)
BUN/Creatinine Ratio: 17 (ref 12–28)
BUN: 14 mg/dL (ref 8–27)
Bilirubin Total: 0.8 mg/dL (ref 0.0–1.2)
CO2: 25 mmol/L (ref 20–29)
Calcium: 9.3 mg/dL (ref 8.7–10.3)
Chloride: 101 mmol/L (ref 96–106)
Creatinine, Ser: 0.84 mg/dL (ref 0.57–1.00)
Globulin, Total: 2.4 g/dL (ref 1.5–4.5)
Glucose: 100 mg/dL — ABNORMAL HIGH (ref 65–99)
Potassium: 4.2 mmol/L (ref 3.5–5.2)
Sodium: 141 mmol/L (ref 134–144)
Total Protein: 6.8 g/dL (ref 6.0–8.5)
eGFR: 78 mL/min/{1.73_m2} (ref >59)

## 2021-04-19 LAB — CBC
Hematocrit: 40 % (ref 34.0–46.6)
Hemoglobin: 12.8 g/dL (ref 11.1–15.9)
MCH: 25.5 pg — ABNORMAL LOW (ref 26.6–33.0)
MCHC: 32 g/dL (ref 31.5–35.7)
MCV: 80 fL (ref 79–97)
Platelets: 267 10*3/uL (ref 150–450)
RBC: 5.01 x10E6/uL (ref 3.77–5.28)
RDW: 13.3 % (ref 11.7–15.4)
WBC: 8.9 10*3/uL (ref 3.4–10.8)

## 2021-04-19 LAB — MICROALBUMIN, URINE: Microalbumin, Urine: 14.3 ug/mL

## 2021-04-19 LAB — TSH: TSH: 5.15 u[IU]/mL — ABNORMAL HIGH (ref 0.450–4.500)

## 2021-04-26 ENCOUNTER — Encounter: Payer: Self-pay | Admitting: Family Medicine

## 2021-04-26 LAB — TOXASSURE SELECT 13 (MW), URINE

## 2021-04-26 MED ORDER — OXAZEPAM 10 MG PO CAPS: 10.0000 mg | ORAL_CAPSULE | Freq: Every evening | ORAL | 0 refills | Status: DC | PRN

## 2021-05-07 MED ORDER — OXAZEPAM 10 MG PO CAPS: 10.0000 mg | ORAL_CAPSULE | Freq: Every evening | ORAL | 2 refills | Status: DC | PRN

## 2021-05-18 ENCOUNTER — Other Ambulatory Visit: Payer: Self-pay | Admitting: Family Medicine

## 2021-05-25 ENCOUNTER — Encounter: Payer: Self-pay | Admitting: Cardiology

## 2021-05-25 ENCOUNTER — Ambulatory Visit (INDEPENDENT_AMBULATORY_CARE_PROVIDER_SITE_OTHER): Payer: 59 | Admitting: Cardiology

## 2021-05-25 VITALS — BP 130/78 | HR 88 | Resp 20 | Ht 65.0 in | Wt 207.4 lb

## 2021-05-25 DIAGNOSIS — I350 Nonrheumatic aortic (valve) stenosis: Secondary | ICD-10-CM

## 2021-05-25 DIAGNOSIS — I10 Essential (primary) hypertension: Secondary | ICD-10-CM

## 2021-05-25 NOTE — Progress Notes (Signed)
Clinical Summary Sarah Pope is a 65 y.o.female seen today for follow up of the following medical problems.    1. Aortic stenosis - noted at recent pcp visit - Jan 2014 echo LVEF 55-60%, mild AV and MV anular calcification   05/2020 echo LVEF 65-70%, mod to severe AS (AVA VTI 0.77 mean 30 DI 0.30)   11/2020 echo LVEF 55-60%, mod to severe AS mean grad 30, AVA VTI 0.92, DI 0.24, SVI 38 - no significant SOB/DOE, no chest pain or syncope    2. HTN - compliant with meds  3. Carotid stenosis - 11/2020 carotid US: RICA [id number], LICA NL Past Medical History:  Diagnosis Date   Acute respiratory failure with hypercapnia (HCC)    Allergy    Anxiety    Asthma    Bronchitis with bronchospasm    Cancer (HCC)    skin   Depression    DM type 2 (diabetes mellitus, type 2) ([place])    FATTY LIVER DISEASE 08/05/2008   GERD (gastroesophageal reflux disease)    Hyperlipidemia    Hypertension    Hypothyroidism    Kidney stones    Leukocytosis    Obesity, Class III, BMI 40-49.9 (morbid obesity) ([place])    PERSONAL HX COLONIC POLYPS 09/01/2008   Seasonal allergies    TRANSAMINASES, SERUM, ELEVATED 08/05/2008     Allergies  Allergen Reactions   Codeine Nausea Only   Sulfa Antibiotics     itching     Current Outpatient Medications  Medication Sig Dispense Refill   albuterol (VENTOLIN HFA) 108 (90 Base) MCG/ACT inhaler INHALE TWO PUFFS INTO THE LUNGS EVERY 6 HOURS AS NEEDED FOR WHEEZING OR SHORTNESS OF BREATH 18 g 2   amantadine (SYMMETREL) 100 MG capsule Take 100 mg by mouth 2 (two) times daily.     aspirin EC 81 MG tablet Take 81 mg by mouth daily.     atorvastatin (LIPITOR) 20 MG tablet Take 1 tablet (20 mg total) by mouth daily. 90 tablet 1   cetirizine (ZYRTEC) 10 MG tablet Take 10 mg by mouth daily.     ciclopirox (PENLAC) 8 % solution Apply topically at bedtime. Apply over nail and surrounding skin. Apply daily over previous coat. After seven (7) days, may remove with alcohol and  continue cycle. 6.6 mL 2   citalopram (CELEXA) 40 MG tablet Take 1 tablet (40 mg total) by mouth daily. 90 tablet 1   Continuous Blood Gluc Receiver (FREESTYLE LIBRE 14 DAY READER) DEVI USE AS DIRECTED THREE TIMES DAILY. 1 each PRN   Continuous Blood Gluc Sensor (FREESTYLE LIBRE 14 DAY SENSOR) MISC USE AS DIRECTED THREE TIMES DAILY WITH SLIDING SCALE INSULIN USE. 2 each PRN   diclofenac (VOLTAREN) 75 MG EC tablet TAKE ONE TABLET BY MOUTH TWICE daily PRN pain. TAKE WITH FOOD. 28 tablet 5   Fluticasone-Salmeterol (ADVAIR DISKUS) 250-50 MCG/DOSE AEPB Inhale 1 puff into the lungs 2 (two) times daily. 1 each 3   hydrochlorothiazide (HYDRODIURIL) 12.5 MG tablet Take 1 tablet (12.5 mg total) by mouth daily. 90 tablet 1   insulin glargine (LANTUS) 100 unit/mL SOPN Inject 80 Units into the skin daily. 15 mL 2   insulin lispro (HUMALOG) 100 UNIT/ML injection Check your blood sugars at least twice daily preferably 3 times daily. Sliding scale: For a blood sugar of 121-150 give yourself 3 units; for blood sugar of 151-200 give 4 units; for blood sugar of 201-250 give 6 units; for blood sugar of 251-300 give  9 units; for blood sugar of 301-350 give 13 units; for blood glucose of 351-400 give 17 units. 10 mL 1   levothyroxine (SYNTHROID) 175 MCG tablet Take 1 tablet (175 mcg total) by mouth daily. 90 tablet 1   losartan (COZAAR) 50 MG tablet Take 1 tablet (50 mg total) by mouth daily. Needs labs. 90 tablet 0   metFORMIN (GLUCOPHAGE) 1000 MG tablet Take 1 tablet (1,000 mg total) by mouth 2 (two) times daily with a meal. 180 tablet 1   omeprazole (PRILOSEC) 40 MG capsule Take 1 capsule (40 mg total) by mouth daily. 90 capsule 1   oxazepam (SERAX) 10 MG capsule Take 1 capsule (10 mg total) by mouth at bedtime as needed. 30 capsule 2   SF 5000 PLUS 1.1 % CREA dental cream 3 (three) times daily. as directed  99   No current facility-administered medications for this visit.     Past Surgical History:  Procedure  Laterality Date   COLONOSCOPY  2013   Repeat 10 years   [place]   realignment   NOSE SURGERY  2007   sinus infections   POLYPECTOMY     skin cancer removal     TONSILLECTOMY       Allergies  Allergen Reactions   Codeine Nausea Only   Sulfa Antibiotics     itching      Family History  Problem Relation Age of Onset   Allergies Daughter    Breast cancer Mother    Diabetes Mother    Prostate cancer Father    Heart attack Maternal Grandmother    Colon cancer Neg Hx    Esophageal cancer Neg Hx    Rectal cancer Neg Hx    Stomach cancer Neg Hx    Colon polyps Neg Hx      Social History Sarah Pope reports that she quit smoking about 38 years ago. Her smoking use included cigarettes. She has a 2.50 pack-year smoking history. She has never used smokeless tobacco. Sarah Pope reports no history of alcohol use.   Review of Systems CONSTITUTIONAL: No weight loss, fever, chills, weakness or fatigue.  HEENT: Eyes: No visual loss, blurred vision, double vision or yellow sclerae.No hearing loss, sneezing, congestion, runny nose or sore throat.  SKIN: No rash or itching.  CARDIOVASCULAR: per hpi RESPIRATORY: No shortness of breath, cough or sputum.  GASTROINTESTINAL: No anorexia, nausea, vomiting or diarrhea. No abdominal pain or blood.  GENITOURINARY: No burning on urination, no polyuria NEUROLOGICAL: No headache, dizziness, syncope, paralysis, ataxia, numbness or tingling in the extremities. No change in bowel or bladder control.  MUSCULOSKELETAL: No muscle, back pain, joint pain or stiffness.  LYMPHATICS: No enlarged nodes. No history of splenectomy.  PSYCHIATRIC: No history of depression or anxiety.  ENDOCRINOLOGIC: No reports of sweating, cold or heat intolerance. No polyuria or polydipsia.  Marland Kitchen   Physical Examination Today's Vitals   05/25/21 1405  BP: 130/78  Pulse: 88  Resp: 20  SpO2: 97%  Weight: 207 lb 6.4 oz (94.1 kg)  Height: '5\' 5"'$  (1.651 m)   PainSc: 0-No pain   Body mass index is 34.51 kg/m.  Gen: resting comfortably, no acute distress HEENT: no scleral icterus, pupils equal round and reactive, no palptable cervical adenopathy,  CV: RRR, 3/6 systolic murmur rusb, no jvd Resp: Clear to auscultation bilaterally GI: abdomen is soft, non-tender, non-distended, normal bowel sounds, no hepatosplenomegaly MSK: extremities are warm, no edema.  Skin: warm, no rash Neuro:  no  focal deficits Psych: appropriate affect   Diagnostic Studies  05/2020 echo IMPRESSIONS     1. Left ventricular ejection fraction, by estimation, is 65 to 70%. The  left ventricle has normal function. The left ventricle has no regional  wall motion abnormalities. There is mild left ventricular hypertrophy.  Left ventricular diastolic parameters  are consistent with Grade I diastolic dysfunction (impaired relaxation).   2. Right ventricular systolic function is normal. The right ventricular  size is normal. There is normal pulmonary artery systolic pressure. The  estimated right ventricular systolic pressure is 0000000 mmHg.   3. The mitral valve is grossly normal. Trivial mitral valve  regurgitation.   4. The aortic valve is tricuspid, moderately calcified with decreased  cusp excursion. Aortic valve regurgitation is trivial. Moderate to severe  aortic valve stenosis. Aortic valve area, by VTI measures 0.77 cm. Aortic  valve mean gradient measures 29.5  mmHg. Aortic valve Vmax measures 3.64 m/s. Dimentionless index 0.30.   5. The inferior vena cava is normal in size with greater than 50%  respiratory variability, suggesting right atrial pressure of 3 mmHg.   11/2020 echo   Assessment and Plan  1. Aortic stenosis - moderate to severe AS by echo favoring the severe end of the spectrum - remains asymptomatic - continue to monitor, repeat echo 4-6 months. - pending next echo, if continues to deny symptoms would likely consider a GXT.    2. HTN -  at goal, continue current meds    Arnoldo Lenis, M.D.

## 2021-05-25 NOTE — Patient Instructions (Addendum)
Medication Instructions:   Your physician recommends that you continue on your current medications as directed. Please refer to the Current Medication list given to you today.  Labwork:  none  Testing/Procedures:  none  Follow-Up:  Your physician recommends that you schedule a follow-up appointment in: 4 months.   Any Other Special Instructions Will Be Listed Below (If Applicable).  If you need a refill on your cardiac medications before your next appointment, please call your pharmacy. 

## 2021-05-31 ENCOUNTER — Ambulatory Visit: Payer: Medicaid Other | Admitting: Podiatry

## 2021-07-20 ENCOUNTER — Other Ambulatory Visit: Payer: Self-pay | Admitting: Family Medicine

## 2021-07-29 NOTE — Patient Instructions (Signed)
Diabetes Mellitus and Nutrition, Adult When you have diabetes, or diabetes mellitus, it is very important to have healthy eating habits because your blood sugar (glucose) levels are greatly affected by what you eat and drink. Eating healthy foods in the right amounts, at about the same times every day, can help you:  Control your blood glucose.  Lower your risk of heart disease.  Improve your blood pressure.  Reach or maintain a healthy weight. What can affect my meal plan? Every person with diabetes is different, and each person has different needs for a meal plan. Your health care provider may recommend that you work with a dietitian to make a meal plan that is best for you. Your meal plan may vary depending on factors such as:  The calories you need.  The medicines you take.  Your weight.  Your blood glucose, blood pressure, and cholesterol levels.  Your activity level.  Other health conditions you have, such as heart or kidney disease. How do carbohydrates affect me? Carbohydrates, also called carbs, affect your blood glucose level more than any other type of food. Eating carbs naturally raises the amount of glucose in your blood. Carb counting is a method for keeping track of how many carbs you eat. Counting carbs is important to keep your blood glucose at a healthy level, especially if you use insulin or take certain oral diabetes medicines. It is important to know how many carbs you can safely have in each meal. This is different for every person. Your dietitian can help you calculate how many carbs you should have at each meal and for each snack. How does alcohol affect me? Alcohol can cause a sudden decrease in blood glucose (hypoglycemia), especially if you use insulin or take certain oral diabetes medicines. Hypoglycemia can be a life-threatening condition. Symptoms of hypoglycemia, such as sleepiness, dizziness, and confusion, are similar to symptoms of having too much  alcohol.  Do not drink alcohol if: ? Your health care provider tells you not to drink. ? You are pregnant, may be pregnant, or are planning to become pregnant.  If you drink alcohol: ? Do not drink on an empty stomach. ? Limit how much you use to:  0-1 drink a day for women.  0-2 drinks a day for men. ? Be aware of how much alcohol is in your drink. In the [place] one drink equals one 12 oz bottle of beer (355 mL), one 5 oz glass of wine (148 mL), or one 1 oz glass of hard liquor (44 mL). ? Keep yourself hydrated with water, diet soda, or unsweetened iced tea.  Keep in mind that regular soda, juice, and other mixers may contain a lot of sugar and must be counted as carbs. What are tips for following this plan? Reading food labels  Start by checking the serving size on the "Nutrition Facts" label of packaged foods and drinks. The amount of calories, carbs, fats, and other nutrients listed on the label is based on one serving of the item. Many items contain more than one serving per package.  Check the total grams (g) of carbs in one serving. You can calculate the number of servings of carbs in one serving by dividing the total carbs by 15. For example, if a food has 30 g of total carbs per serving, it would be equal to 2 servings of carbs.  Check the number of grams (g) of saturated fats and trans fats in one serving. Choose foods that have   a low amount or none of these fats.  Check the number of milligrams (mg) of salt (sodium) in one serving. Most people should limit total sodium intake to less than 2,300 mg per day.  Always check the nutrition information of foods labeled as "low-fat" or "nonfat." These foods may be higher in added sugar or refined carbs and should be avoided.  Talk to your dietitian to identify your daily goals for nutrients listed on the label. Shopping  Avoid buying canned, pre-made, or processed foods. These foods tend to be high in fat, sodium, and added  sugar.  Shop around the outside edge of the grocery store. This is where you will most often find fresh fruits and vegetables, bulk grains, fresh meats, and fresh dairy. Cooking  Use low-heat cooking methods, such as baking, instead of high-heat cooking methods like deep frying.  Cook using healthy oils, such as olive, canola, or sunflower oil.  Avoid cooking with butter, cream, or high-fat meats. Meal planning  Eat meals and snacks regularly, preferably at the same times every day. Avoid going long periods of time without eating.  Eat foods that are high in fiber, such as fresh fruits, vegetables, beans, and whole grains. Talk with your dietitian about how many servings of carbs you can eat at each meal.  Eat 4-6 oz (112-168 g) of lean protein each day, such as lean meat, chicken, fish, eggs, or tofu. One ounce (oz) of lean protein is equal to: ? 1 oz (28 g) of meat, chicken, or fish. ? 1 egg. ?  cup (62 g) of tofu.  Eat some foods each day that contain healthy fats, such as avocado, nuts, seeds, and fish.   What foods should I eat? Fruits Berries. Apples. Oranges. Peaches. Apricots. Plums. Grapes. Mango. Papaya. Pomegranate. Kiwi. Cherries. Vegetables Lettuce. Spinach. Leafy greens, including kale, chard, collard greens, and mustard greens. Beets. Cauliflower. Cabbage. Broccoli. Carrots. Green beans. Tomatoes. Peppers. Onions. Cucumbers. Brussels sprouts. Grains Whole grains, such as whole-wheat or whole-grain bread, crackers, tortillas, cereal, and pasta. Unsweetened oatmeal. Quinoa. Brown or wild rice. Meats and other proteins Seafood. Poultry without skin. Lean cuts of poultry and beef. Tofu. Nuts. Seeds. Dairy Low-fat or fat-free dairy products such as milk, yogurt, and cheese. The items listed above may not be a complete list of foods and beverages you can eat. Contact a dietitian for more information. What foods should I avoid? Fruits Fruits canned with  syrup. Vegetables Canned vegetables. Frozen vegetables with butter or cream sauce. Grains Refined white flour and flour products such as bread, pasta, snack foods, and cereals. Avoid all processed foods. Meats and other proteins Fatty cuts of meat. Poultry with skin. Breaded or fried meats. Processed meat. Avoid saturated fats. Dairy Full-fat yogurt, cheese, or milk. Beverages Sweetened drinks, such as soda or iced tea. The items listed above may not be a complete list of foods and beverages you should avoid. Contact a dietitian for more information. Questions to ask a health care provider  Do I need to meet with a diabetes educator?  Do I need to meet with a dietitian?  What number can I call if I have questions?  When are the best times to check my blood glucose? Where to find more information:  American Diabetes Association: diabetes.org  Academy of Nutrition and Dietetics: www.eatright.org  National Institute of Diabetes and Digestive and Kidney Diseases: www.niddk.nih.gov  Association of Diabetes Care and Education Specialists: www.diabeteseducator.org Summary  It is important to have healthy eating   habits because your blood sugar (glucose) levels are greatly affected by what you eat and drink.  A healthy meal plan will help you control your blood glucose and maintain a healthy lifestyle.  Your health care provider may recommend that you work with a dietitian to make a meal plan that is best for you.  Keep in mind that carbohydrates (carbs) and alcohol have immediate effects on your blood glucose levels. It is important to count carbs and to use alcohol carefully. This information is not intended to replace advice given to you by your health care provider. Make sure you discuss any questions you have with your health care provider. Document Revised: 09/16/2019 Document Reviewed: 09/16/2019 [employer] Patient Education  2021 [employer]  

## 2021-08-01 ENCOUNTER — Other Ambulatory Visit: Payer: Self-pay

## 2021-08-01 ENCOUNTER — Encounter: Payer: Self-pay | Admitting: Nurse Practitioner

## 2021-08-01 ENCOUNTER — Ambulatory Visit (INDEPENDENT_AMBULATORY_CARE_PROVIDER_SITE_OTHER): Payer: 59 | Admitting: Nurse Practitioner

## 2021-08-01 VITALS — BP 107/69 | HR 80 | Ht 65.0 in | Wt 202.2 lb

## 2021-08-01 DIAGNOSIS — E782 Mixed hyperlipidemia: Secondary | ICD-10-CM | POA: Diagnosis not present

## 2021-08-01 DIAGNOSIS — E119 Type 2 diabetes mellitus without complications: Secondary | ICD-10-CM | POA: Diagnosis not present

## 2021-08-01 DIAGNOSIS — E039 Hypothyroidism, unspecified: Secondary | ICD-10-CM

## 2021-08-01 DIAGNOSIS — Z794 Long term (current) use of insulin: Secondary | ICD-10-CM

## 2021-08-01 DIAGNOSIS — I10 Essential (primary) hypertension: Secondary | ICD-10-CM | POA: Diagnosis not present

## 2021-08-01 LAB — POCT GLYCOSYLATED HEMOGLOBIN (HGB A1C): HbA1c, POC (controlled diabetic range): 10.2 % — AB (ref 0.0–7.0)

## 2021-08-01 MED ORDER — INSULIN LISPRO 100 UNIT/ML ~~LOC~~ SOLN: 12.0000 [IU] | Freq: Three times a day (TID) | SUBCUTANEOUS | 1 refills | Status: DC

## 2021-08-01 NOTE — Progress Notes (Signed)
Endocrinology Consult Note       08/01/2021, 9:47 AM   Subjective:    Patient ID: Sarah Pope, female    DOB: [birth year].  Sarah Pope is being seen in consultation for management of currently uncontrolled symptomatic diabetes requested by  Riverdale Nation, MD.   Past Medical History:  Diagnosis Date   Acute respiratory failure with hypercapnia ([place])    Allergy    Anxiety    Asthma    Bronchitis with bronchospasm    Cancer ([place])    skin   Depression    DM type 2 (diabetes mellitus, type 2) ([place])    FATTY LIVER DISEASE 08/05/2008   GERD (gastroesophageal reflux disease)    Hyperlipidemia    Hypertension    Hypothyroidism    Kidney stones    Leukocytosis    Obesity, Class III, BMI 40-49.9 (morbid obesity) ([place])    PERSONAL HX COLONIC POLYPS 09/01/2008   Seasonal allergies    TRANSAMINASES, SERUM, ELEVATED 08/05/2008    Past Surgical History:  Procedure Laterality Date   COLONOSCOPY  2013   Repeat 10 years   [place]   realignment   NOSE SURGERY  2007   sinus infections   POLYPECTOMY     skin cancer removal     TONSILLECTOMY      Social History   Socioeconomic History   Marital status: Divorced    Spouse name: Not on file   Number of children: Not on file   Years of education: Not on file   Highest education level: Not on file  Occupational History   Occupation: Lead Clerk at [name] Use   Smoking status: Former    Packs/day: 0.50    Years: 5.00    Pack years: 2.50    Types: Cigarettes    Quit date: 10/23/1982    Years since quitting: 38.8   Smokeless tobacco: Never  Vaping Use   Vaping Use: Never used  Substance and Sexual Activity   Alcohol use: No   Drug use: No   Sexual activity: Not on file  Other Topics Concern   Not on file  Social History Narrative   Not on file   Social Determinants of Health   Financial Resource Strain: Not  on file  Food Insecurity: Not on file  Transportation Needs: Not on file  Physical Activity: Not on file  Stress: Not on file  Social Connections: Not on file    Family History  Problem Relation Age of Onset   Cancer Mother    Hypertension Mother    Thyroid disease Mother    Breast cancer Mother    Diabetes Mother    Prostate cancer Father    Heart attack Maternal Grandmother    Allergies Daughter    Colon cancer Neg Hx    Esophageal cancer Neg Hx    Rectal cancer Neg Hx    Stomach cancer Neg Hx    Colon polyps Neg Hx     Outpatient Encounter Medications as of 08/01/2021  Medication Sig   albuterol (VENTOLIN HFA) 108 (90 Base) MCG/ACT inhaler INHALE TWO PUFFS INTO THE LUNGS EVERY 6  HOURS AS NEEDED FOR WHEEZING OR SHORTNESS OF BREATH   aspirin EC 81 MG tablet Take 81 mg by mouth daily.   atorvastatin (LIPITOR) 20 MG tablet Take 1 tablet (20 mg total) by mouth daily.   cetirizine (ZYRTEC) 10 MG tablet Take 10 mg by mouth daily.   ciclopirox (PENLAC) 8 % solution Apply topically at bedtime. Apply over nail and surrounding skin. Apply daily over previous coat. After seven (7) days, may remove with alcohol and continue cycle.   citalopram (CELEXA) 40 MG tablet Take 1 tablet (40 mg total) by mouth daily.   Continuous Blood Gluc Receiver (FREESTYLE LIBRE 14 DAY READER) DEVI USE AS DIRECTED THREE TIMES DAILY.   Continuous Blood Gluc Sensor (FREESTYLE LIBRE 14 DAY SENSOR) MISC USE AS DIRECTED THREE TIMES DAILY WITH SLIDING SCALE INSULIN USE.   diclofenac (VOLTAREN) 75 MG EC tablet TAKE ONE TABLET BY MOUTH TWICE daily PRN pain. TAKE WITH FOOD.   Fluticasone-Salmeterol (ADVAIR DISKUS) 250-50 MCG/DOSE AEPB Inhale 1 puff into the lungs 2 (two) times daily.   hydrochlorothiazide (HYDRODIURIL) 12.5 MG tablet Take 1 tablet (12.5 mg total) by mouth daily.   insulin glargine (LANTUS) 100 unit/mL SOPN Inject 80 Units into the skin daily.   levothyroxine (SYNTHROID) 175 MCG tablet Take 1 tablet  (175 mcg total) by mouth daily.   losartan (COZAAR) 50 MG tablet Take 1 tablet (50 mg total) by mouth daily. Needs labs.   metFORMIN (GLUCOPHAGE) 1000 MG tablet Take 1 tablet (1,000 mg total) by mouth 2 (two) times daily with a meal.   omeprazole (PRILOSEC) 40 MG capsule Take 1 capsule (40 mg total) by mouth daily.   [DISCONTINUED] insulin lispro (HUMALOG) 100 UNIT/ML injection Check your blood sugars at least twice daily preferably 3 times daily. Sliding scale: For a blood sugar of 121-150 give yourself 3 units; for blood sugar of 151-200 give 4 units; for blood sugar of 201-250 give 6 units; for blood sugar of 251-300 give 9 units; for blood sugar of 301-350 give 13 units; for blood glucose of 351-400 give 17 units.   insulin lispro (HUMALOG) 100 UNIT/ML injection Inject 0.12-0.18 mLs (12-18 Units total) into the skin 3 (three) times daily with meals.   [DISCONTINUED] amantadine (SYMMETREL) 100 MG capsule Take 100 mg by mouth 2 (two) times daily. (Patient not taking: Reported on 08/01/2021)   [DISCONTINUED] oxazepam (SERAX) 10 MG capsule Take 1 capsule (10 mg total) by mouth at bedtime as needed. (Patient not taking: Reported on 08/01/2021)   No facility-administered encounter medications on file as of 08/01/2021.    ALLERGIES: Allergies  Allergen Reactions   Codeine Nausea Only   Sulfa Antibiotics     itching    VACCINATION STATUS: Immunization History  Administered Date(s) Administered   Influenza Split 07/03/2012, 09/10/2013   Influenza,inj,Quad PF,6+ Mos 09/29/2015, 06/27/2016, 08/15/2017, 08/28/2018, 07/14/2019, 08/09/2020   Influenza-Unspecified 08/24/2011, 08/11/2020   Moderna Sars-Covid-2 Vaccination 04/05/2020, 04/28/2020   Pneumococcal Polysaccharide-23 08/23/2004, 08/28/2018   Td 06/17/2008   Tdap 05/09/2019   Zoster Recombinat (Shingrix) 10/14/2019, 08/04/2020, 08/11/2020    Diabetes She presents for her initial diabetic visit. She has type 2 diabetes mellitus. Onset  time: Diagnosed at approx age of 65. Her disease course has been worsening. There are no hypoglycemic associated symptoms. Associated symptoms include fatigue, foot paresthesias, polydipsia, polyuria and weight loss. There are no hypoglycemic complications. Symptoms are stable. Diabetic complications include nephropathy and peripheral neuropathy. Risk factors for coronary artery disease include stress, diabetes mellitus, dyslipidemia, family history,  obesity, hypertension, post-menopausal and sedentary lifestyle. Current diabetic treatment includes intensive insulin program and oral agent (monotherapy). She is compliant with treatment most of the time. Her weight is stable. She is following a generally unhealthy diet. When asked about meal planning, she reported none. She has not had a previous visit with a dietitian. She rarely participates in exercise. (She presents today for her consultation with no meter or logs to review.  She does have a CGM but is due to start another sensor.  Her POCT A1c today is 10.2%, increasing from last A1c of 9.4%.  She does report significant amount of stress.  She drinks mostly water, will occasionally have sips of soda every now and then, and does not have a routine eating pattern.  She does admit to snacking occasionally.  She does not engage in routine physical activity.  She is due for annual eye exam and sees a podiatrist.  She denies any significant hypoglycemia.) An ACE inhibitor/angiotensin II receptor blocker is being taken. She sees a podiatrist.Eye exam is not current.  Hyperlipidemia This is a chronic problem. The current episode started more than 1 year ago. The problem is uncontrolled. Recent lipid tests were reviewed and are variable. Exacerbating diseases include chronic renal disease, diabetes, hypothyroidism and obesity. Factors aggravating her hyperlipidemia include thiazides and fatty foods. Current antihyperlipidemic treatment includes statins. The current  treatment provides moderate improvement of lipids. Compliance problems include adherence to diet and adherence to exercise.  Risk factors for coronary artery disease include diabetes mellitus, dyslipidemia, family history, hypertension, obesity, post-menopausal, a sedentary lifestyle and stress.  Hypertension This is a chronic problem. The current episode started more than 1 year ago. The problem has been resolved since onset. The problem is controlled. Agents associated with hypertension include thyroid hormones. Risk factors for coronary artery disease include diabetes mellitus, dyslipidemia, family history, post-menopausal state, sedentary lifestyle, stress and obesity. Past treatments include angiotensin blockers and diuretics. The current treatment provides significant improvement. Compliance problems include exercise and diet.  Hypertensive end-organ damage includes kidney disease. Identifiable causes of hypertension include chronic renal disease and sleep apnea.    Review of systems  Constitutional: + Minimally fluctuating body weight, current Body mass index is 33.65 kg/m., + fatigue, no subjective hyperthermia, no subjective hypothermia Eyes: no blurry vision, no xerophthalmia ENT: no sore throat, no nodules palpated in throat, no dysphagia/odynophagia, no hoarseness Cardiovascular: no chest pain, no shortness of breath, no palpitations, + intermittent leg swelling Respiratory: no cough, no shortness of breath Gastrointestinal: no nausea/vomiting/diarrhea Musculoskeletal: no muscle/joint aches Skin: no rashes, no hyperemia Neurological: + resting tremors, + numbness/tingling/burning pain to bilateral feet, no dizziness Psychiatric: no depression, no anxiety  Objective:     BP 107/69   Pulse 80   Ht 5\' 5"  (1.651 m)   Wt 202 lb 3.2 oz (91.7 kg)   BMI 33.65 kg/m   Wt Readings from Last 3 Encounters:  08/01/21 202 lb 3.2 oz (91.7 kg)  05/25/21 207 lb 6.4 oz (94.1 kg)  04/18/21 201  lb 9.6 oz (91.4 kg)     BP Readings from Last 3 Encounters:  08/01/21 107/69  05/25/21 130/78  04/18/21 110/67     Physical Exam- Limited  Constitutional:  Body mass index is 33.65 kg/m. , not in acute distress, normal state of mind Eyes:  EOMI, no exophthalmos Neck: Supple Cardiovascular: RRR, + murmur, rubs, or gallops, nonpitting edema to bilateral feet Respiratory: Adequate breathing efforts, no crackles, rales, rhonchi, or wheezing Musculoskeletal:  no gross deformities, strength intact in all four extremities, no gross restriction of joint movements Skin:  no rashes, no hyperemia Neurological: mild resting tremor     CMP ( most recent) CMP     Component Value Date/Time   NA 141 04/18/2021 0943   K 4.2 04/18/2021 0943   CL 101 04/18/2021 0943   CO2 25 04/18/2021 0943   GLUCOSE 100 (H) 04/18/2021 0943   GLUCOSE 175 (H) 05/31/2020 0930   BUN 14 04/18/2021 0943   CREATININE 0.84 04/18/2021 0943   CREATININE 0.64 02/05/2013 1148   CALCIUM 9.3 04/18/2021 0943   PROT 6.8 04/18/2021 0943   ALBUMIN 4.4 04/18/2021 0943   AST 13 04/18/2021 0943   ALT 17 04/18/2021 0943   ALKPHOS 115 04/18/2021 0943   BILITOT 0.8 04/18/2021 0943   GFRNONAA >60 05/31/2020 0930   GFRAA >60 05/31/2020 0930     Diabetic Labs (most recent): Lab Results  Component Value Date   HGBA1C 10.2 (A) 08/01/2021   HGBA1C 9.4 (H) 04/18/2021   HGBA1C 7.0 (A) 08/09/2020     Lipid Panel ( most recent) Lipid Panel     Component Value Date/Time   CHOL 165 04/18/2021 0943   TRIG 159 (H) 04/18/2021 0943   HDL 45 04/18/2021 0943   CHOLHDL 3.7 04/18/2021 0943   CHOLHDL 3.4 12/16/2019 0905   VLDL 22 12/16/2019 0905   LDLCALC 92 04/18/2021 0943   LABVLDL 28 04/18/2021 0943      Lab Results  Component Value Date   TSH 5.150 (H) 04/18/2021   TSH 1.503 12/16/2019   TSH 1.360 07/01/2019   TSH 2.480 04/01/2018   TSH 2.650 10/03/2016   TSH 3.620 10/06/2015   TSH 4.248 02/05/2013   TSH 3.189  06/30/2012           Assessment & Plan:   1) Type 2 diabetes mellitus without complication, with long-term current use of insulin ([place])  She presents today for her consultation with no meter or logs to review.  She does have a CGM but is due to start another sensor.  Her POCT A1c today is 10.2%, increasing from last A1c of 9.4%.  She does report significant amount of stress.  She drinks mostly water, will occasionally have sips of soda every now and then, and does not have a routine eating pattern.  She does admit to snacking occasionally.  She does not engage in routine physical activity.  She is due for annual eye exam and sees a podiatrist.  She denies any significant hypoglycemia.  - Sarah Pope has currently uncontrolled symptomatic type 2 DM since 65 years of age, with most recent A1c of 10.2 %.   -Recent labs reviewed.  - I had a long discussion with her about the progressive nature of diabetes and the pathology behind its complications. -her diabetes is complicated by mild CKD, neuropathy and she remains at a high risk for more acute and chronic complications which include CAD, CVA, CKD, retinopathy, and neuropathy. These are all discussed in detail with her.  - I have counseled her on diet and weight management by adopting a carbohydrate restricted/protein rich diet. Patient is encouraged to switch to unprocessed or minimally processed complex starch and increased protein intake (animal or plant source), fruits, and vegetables. -  she is advised to stick to a routine mealtimes to eat 3 meals a day and avoid unnecessary snacks (to snack only to correct hypoglycemia).   - she acknowledges that there is  a room for improvement in her food and drink choices. - Suggestion is made for her to avoid simple carbohydrates from her diet including Cakes, Sweet Desserts, Ice Cream, Soda (diet and regular), Sweet Tea, Candies, Chips, Cookies, Store Bought Juices, Alcohol in Excess of 1-2 drinks  a day, Artificial Sweeteners, Coffee Creamer, and "Sugar-free" Products. This will help patient to have more stable blood glucose profile and potentially avoid unintended weight gain.  - I have approached her with the following individualized plan to manage her diabetes and patient agrees:   -She is advised to continue Lantus 80 units SQ nightly and change how she takes her Humalog to 12-18 units TID with meals if glucose is above 90 and she is eating (Specific instructions on how to titrate insulin dosage based on glucose readings given to patient in writing).  -she is encouraged to start monitoring glucose 4 times daily (using her CGM or backup method), before meals and before bed, to log their readings on the clinic sheets provided, and bring them to review at follow up appointment in 2 weeks.  - she is warned not to take insulin without proper monitoring per orders. - Adjustment parameters are given to her for hypo and hyperglycemia in writing. - she is encouraged to call clinic for blood glucose levels less than 70 or above 300 mg /dl. - she is advised to continue Metformin 1000 mg po twice daily with meals, therapeutically suitable for patient .  -She has allergy to sulfa meds.  - she will be considered for incretin therapy as appropriate next visit.  - Specific targets for  A1c; LDL, HDL, and Triglycerides were discussed with the patient.  2) Blood Pressure /Hypertension:  her blood pressure is controlled to target.   she is advised to continue her current medications including HCTZ 12.5 mg po daily and Losartan 50 mg po daily.  3) Lipids/Hyperlipidemia:    Review of her recent lipid panel from 04/18/21 showed controlled LDL at 92 and slightly elevated triglycerides of 159 .  she is advised to continue Lipitor 20 mg daily at bedtime.  Side effects and precautions discussed with her.  4)  Weight/Diet:  her Body mass index is 33.65 kg/m.  -  clearly complicating her diabetes care.    she is a candidate for weight loss. I discussed with her the fact that loss of 5 - 10% of her  current body weight will have the most impact on her diabetes management.  Exercise, and detailed carbohydrates information provided  -  detailed on discharge instructions.  5) Chronic Care/Health Maintenance: -she is on ACEI/ARB and Statin medications and is encouraged to initiate and continue to follow up with Ophthalmology, Dentist, Podiatrist at least yearly or according to recommendations, and advised to stay away from smoking. I have recommended yearly flu vaccine and pneumonia vaccine at least every 5 years; moderate intensity exercise for up to 150 minutes weekly; and sleep for at least 7 hours a day.  - she is advised to maintain close follow up with Bromley Nation, MD for primary care needs, as well as her other providers for optimal and coordinated care.   - Time spent in this patient care: 60 min, of which > 50% was spent in counseling her about her diabetes and the rest reviewing her blood glucose logs, discussing her hypoglycemia and hyperglycemia episodes, reviewing her current and previous labs/studies (including abstraction from other facilities) and medications doses and developing a long term treatment plan based  on the latest standards of care/guidelines; and documenting her care.    Please refer to Patient Instructions for Blood Glucose Monitoring and Insulin/Medications Dosing Guide" in media tab for additional information. Please also refer to "Patient Self Inventory" in the Media tab for reviewed elements of pertinent patient history.  August Albino participated in the discussions, expressed understanding, and voiced agreement with the above plans.  All questions were answered to her satisfaction. she is encouraged to contact clinic should she have any questions or concerns prior to her return visit.     Follow up plan: - Return in about 2 weeks (around 08/15/2021) for  Diabetes F/U, Bring meter and logs, No previsit labs.    Rayetta Pigg, [facility] [address] [place], [place] [address] Phone: [phone] Fax: [phone]  08/01/2021, 9:47 AM

## 2021-08-17 ENCOUNTER — Encounter: Payer: Self-pay | Admitting: Nurse Practitioner

## 2021-08-17 ENCOUNTER — Ambulatory Visit (INDEPENDENT_AMBULATORY_CARE_PROVIDER_SITE_OTHER): Payer: 59 | Admitting: Nurse Practitioner

## 2021-08-17 ENCOUNTER — Other Ambulatory Visit: Payer: Self-pay

## 2021-08-17 VITALS — BP 119/71 | HR 77 | Ht 65.0 in | Wt 202.4 lb

## 2021-08-17 DIAGNOSIS — E119 Type 2 diabetes mellitus without complications: Secondary | ICD-10-CM | POA: Diagnosis not present

## 2021-08-17 DIAGNOSIS — E039 Hypothyroidism, unspecified: Secondary | ICD-10-CM | POA: Diagnosis not present

## 2021-08-17 DIAGNOSIS — I10 Essential (primary) hypertension: Secondary | ICD-10-CM

## 2021-08-17 DIAGNOSIS — E782 Mixed hyperlipidemia: Secondary | ICD-10-CM

## 2021-08-17 DIAGNOSIS — Z794 Long term (current) use of insulin: Secondary | ICD-10-CM | POA: Diagnosis not present

## 2021-08-17 NOTE — Patient Instructions (Signed)

## 2021-08-17 NOTE — Progress Notes (Signed)
Endocrinology Follow Up Note       08/17/2021, 9:01 AM   Subjective:    Patient ID: Sarah Pope, female    DOB: [birth year].  Sarah Pope is being seen in follow up after being seen in consultation for management of currently uncontrolled symptomatic diabetes requested by  Paxton Nation, MD.   Past Medical History:  Diagnosis Date   Acute respiratory failure with hypercapnia ([place])    Allergy    Anxiety    Asthma    Bronchitis with bronchospasm    Cancer ([place])    skin   Depression    DM type 2 (diabetes mellitus, type 2) ([place])    FATTY LIVER DISEASE 08/05/2008   GERD (gastroesophageal reflux disease)    Hyperlipidemia    Hypertension    Hypothyroidism    Kidney stones    Leukocytosis    Obesity, Class III, BMI 40-49.9 (morbid obesity) ([place])    PERSONAL HX COLONIC POLYPS 09/01/2008   Seasonal allergies    TRANSAMINASES, SERUM, ELEVATED 08/05/2008    Past Surgical History:  Procedure Laterality Date   COLONOSCOPY  2013   Repeat 10 years   [place]   realignment   NOSE SURGERY  2007   sinus infections   POLYPECTOMY     skin cancer removal     TONSILLECTOMY      Social History   Socioeconomic History   Marital status: Divorced    Spouse name: Not on file   Number of children: Not on file   Years of education: Not on file   Highest education level: Not on file  Occupational History   Occupation: Lead Clerk at [employer]   Smoking status: Former    Packs/day: 0.50    Years: 5.00    Pack years: 2.50    Types: Cigarettes    Quit date: 10/23/1982    Years since quitting: 38.8   Smokeless tobacco: Never  Vaping Use   Vaping Use: Never used  Substance and Sexual Activity   Alcohol use: No   Drug use: No   Sexual activity: Not on file  Other Topics Concern   Not on file  Social History Narrative   Not on file   Social Determinants of Health    Financial Resource Strain: Not on file  Food Insecurity: Not on file  Transportation Needs: Not on file  Physical Activity: Not on file  Stress: Not on file  Social Connections: Not on file    Family History  Problem Relation Age of Onset   Cancer Mother    Hypertension Mother    Thyroid disease Mother    Breast cancer Mother    Diabetes Mother    Prostate cancer Father    Heart attack Maternal Grandmother    Allergies Daughter    Colon cancer Neg Hx    Esophageal cancer Neg Hx    Rectal cancer Neg Hx    Stomach cancer Neg Hx    Colon polyps Neg Hx     Outpatient Encounter Medications as of 08/17/2021  Medication Sig   albuterol (VENTOLIN HFA) 108 (90 Base) MCG/ACT inhaler INHALE  TWO PUFFS INTO THE LUNGS EVERY 6 HOURS AS NEEDED FOR WHEEZING OR SHORTNESS OF BREATH   aspirin EC 81 MG tablet Take 81 mg by mouth daily.   atorvastatin (LIPITOR) 20 MG tablet Take 1 tablet (20 mg total) by mouth daily.   cetirizine (ZYRTEC) 10 MG tablet Take 10 mg by mouth daily.   ciclopirox (PENLAC) 8 % solution Apply topically at bedtime. Apply over nail and surrounding skin. Apply daily over previous coat. After seven (7) days, may remove with alcohol and continue cycle.   citalopram (CELEXA) 40 MG tablet Take 1 tablet (40 mg total) by mouth daily.   Continuous Blood Gluc Receiver (FREESTYLE LIBRE 14 DAY READER) DEVI USE AS DIRECTED THREE TIMES DAILY.   Continuous Blood Gluc Sensor (FREESTYLE LIBRE 14 DAY SENSOR) MISC USE AS DIRECTED THREE TIMES DAILY WITH SLIDING SCALE INSULIN USE.   diclofenac (VOLTAREN) 75 MG EC tablet TAKE ONE TABLET BY MOUTH TWICE daily PRN pain. TAKE WITH FOOD.   Fluticasone-Salmeterol (ADVAIR DISKUS) 250-50 MCG/DOSE AEPB Inhale 1 puff into the lungs 2 (two) times daily.   hydrochlorothiazide (HYDRODIURIL) 12.5 MG tablet Take 1 tablet (12.5 mg total) by mouth daily.   insulin glargine (LANTUS) 100 unit/mL SOPN Inject 80 Units into the skin daily.   insulin lispro  (HUMALOG) 100 UNIT/ML injection Inject 0.12-0.18 mLs (12-18 Units total) into the skin 3 (three) times daily with meals.   levothyroxine (SYNTHROID) 175 MCG tablet Take 1 tablet (175 mcg total) by mouth daily.   losartan (COZAAR) 50 MG tablet Take 1 tablet (50 mg total) by mouth daily. Needs labs.   metFORMIN (GLUCOPHAGE) 1000 MG tablet Take 1 tablet (1,000 mg total) by mouth 2 (two) times daily with a meal.   omeprazole (PRILOSEC) 40 MG capsule Take 1 capsule (40 mg total) by mouth daily.   No facility-administered encounter medications on file as of 08/17/2021.    ALLERGIES: Allergies  Allergen Reactions   Codeine Nausea Only   Sulfa Antibiotics     itching    VACCINATION STATUS: Immunization History  Administered Date(s) Administered   Influenza Split 07/03/2012, 09/10/2013   Influenza,inj,Quad PF,6+ Mos 09/29/2015, 06/27/2016, 08/15/2017, 08/28/2018, 07/14/2019, 08/09/2020   Influenza-Unspecified 08/24/2011, 08/11/2020   Moderna Sars-Covid-2 Vaccination 04/05/2020, 04/28/2020   Pneumococcal Polysaccharide-23 08/23/2004, 08/28/2018   Td 06/17/2008   Tdap 05/09/2019   Zoster Recombinat (Shingrix) 10/14/2019, 08/04/2020, 08/11/2020    Diabetes She presents for her follow-up diabetic visit. She has type 2 diabetes mellitus. Onset time: Diagnosed at approx age of 31. Her disease course has been improving. There are no hypoglycemic associated symptoms. Associated symptoms include fatigue, foot paresthesias, polydipsia and polyuria. Pertinent negatives for diabetes include no weight loss. There are no hypoglycemic complications. Symptoms are improving. Diabetic complications include nephropathy and peripheral neuropathy. Risk factors for coronary artery disease include stress, diabetes mellitus, dyslipidemia, family history, obesity, hypertension, post-menopausal and sedentary lifestyle. Current diabetic treatment includes intensive insulin program and oral agent (monotherapy). She is  compliant with treatment most of the time. Her weight is stable. She is following a generally unhealthy diet. When asked about meal planning, she reported none. She has not had a previous visit with a dietitian. She rarely participates in exercise. Her home blood glucose trend is decreasing steadily. Her breakfast blood glucose range is generally 180-200 mg/dl. Her lunch blood glucose range is generally 180-200 mg/dl. Her dinner blood glucose range is generally >200 mg/dl. Her bedtime blood glucose range is generally >200 mg/dl. Her overall blood glucose range  is 180-200 mg/dl. (She presents today with her CGM showing improved glycemic profile overall.  She was not due for another A1c today.  Analysis of her CGM shows GMI of 7.9% (improvement from previous A1c of 10.2%) and TIR 50%, TAR 49%, TBR < 1%.  She has worked hard on developing a eating routine and has cut out sodas.  She denies any significant hypoglycemia.) An ACE inhibitor/angiotensin II receptor blocker is being taken. She sees a podiatrist.Eye exam is not current.  Hyperlipidemia This is a chronic problem. The current episode started more than 1 year ago. The problem is uncontrolled. Recent lipid tests were reviewed and are variable. Exacerbating diseases include chronic renal disease, diabetes, hypothyroidism and obesity. Factors aggravating her hyperlipidemia include thiazides and fatty foods. Current antihyperlipidemic treatment includes statins. The current treatment provides moderate improvement of lipids. Compliance problems include adherence to diet and adherence to exercise.  Risk factors for coronary artery disease include diabetes mellitus, dyslipidemia, family history, hypertension, obesity, post-menopausal, a sedentary lifestyle and stress.  Hypertension This is a chronic problem. The current episode started more than 1 year ago. The problem has been resolved since onset. The problem is controlled. Agents associated with hypertension  include thyroid hormones. Risk factors for coronary artery disease include diabetes mellitus, dyslipidemia, family history, post-menopausal state, sedentary lifestyle, stress and obesity. Past treatments include angiotensin blockers and diuretics. The current treatment provides significant improvement. Compliance problems include exercise and diet.  Hypertensive end-organ damage includes kidney disease. Identifiable causes of hypertension include chronic renal disease and sleep apnea.    Review of systems  Constitutional: + Minimally fluctuating body weight, current Body mass index is 33.68 kg/m., + fatigue, no subjective hyperthermia, no subjective hypothermia Eyes: no blurry vision, no xerophthalmia ENT: no sore throat, no nodules palpated in throat, no dysphagia/odynophagia, no hoarseness Cardiovascular: no chest pain, no shortness of breath, no palpitations, + intermittent leg swelling Respiratory: no cough, no shortness of breath Gastrointestinal: no nausea/vomiting/diarrhea Musculoskeletal: no muscle/joint aches Skin: no rashes, no hyperemia Neurological: + resting tremors, + numbness/tingling/burning pain to bilateral feet, no dizziness Psychiatric: no depression, no anxiety  Objective:     BP 119/71   Pulse 77   Ht 5\' 5"  (1.651 m)   Wt 202 lb 6.4 oz (91.8 kg)   BMI 33.68 kg/m   Wt Readings from Last 3 Encounters:  08/17/21 202 lb 6.4 oz (91.8 kg)  08/01/21 202 lb 3.2 oz (91.7 kg)  05/25/21 207 lb 6.4 oz (94.1 kg)     BP Readings from Last 3 Encounters:  08/17/21 119/71  08/01/21 107/69  05/25/21 130/78     Physical Exam- Limited  Constitutional:  Body mass index is 33.68 kg/m. , not in acute distress, normal state of mind Eyes:  EOMI, no exophthalmos Neck: Supple Cardiovascular: RRR, + murmur, rubs, or gallops, nonpitting edema to bilateral feet Respiratory: Adequate breathing efforts, no crackles, rales, rhonchi, or wheezing Musculoskeletal: no gross  deformities, strength intact in all four extremities, no gross restriction of joint movements Skin:  no rashes, no hyperemia Neurological: mild resting tremor     CMP ( most recent) CMP     Component Value Date/Time   NA 141 04/18/2021 0943   K 4.2 04/18/2021 0943   CL 101 04/18/2021 0943   CO2 25 04/18/2021 0943   GLUCOSE 100 (H) 04/18/2021 0943   GLUCOSE 175 (H) 05/31/2020 0930   BUN 14 04/18/2021 0943   CREATININE 0.84 04/18/2021 0943   CREATININE 0.64 02/05/2013 1148  CALCIUM 9.3 04/18/2021 0943   PROT 6.8 04/18/2021 0943   ALBUMIN 4.4 04/18/2021 0943   AST 13 04/18/2021 0943   ALT 17 04/18/2021 0943   ALKPHOS 115 04/18/2021 0943   BILITOT 0.8 04/18/2021 0943   GFRNONAA >60 05/31/2020 0930   GFRAA >60 05/31/2020 0930     Diabetic Labs (most recent): Lab Results  Component Value Date   HGBA1C 10.2 (A) 08/01/2021   HGBA1C 9.4 (H) 04/18/2021   HGBA1C 7.0 (A) 08/09/2020     Lipid Panel ( most recent) Lipid Panel     Component Value Date/Time   CHOL 165 04/18/2021 0943   TRIG 159 (H) 04/18/2021 0943   HDL 45 04/18/2021 0943   CHOLHDL 3.7 04/18/2021 0943   CHOLHDL 3.4 12/16/2019 0905   VLDL 22 12/16/2019 0905   LDLCALC 92 04/18/2021 0943   LABVLDL 28 04/18/2021 0943      Lab Results  Component Value Date   TSH 5.150 (H) 04/18/2021   TSH 1.503 12/16/2019   TSH 1.360 07/01/2019   TSH 2.480 04/01/2018   TSH 2.650 10/03/2016   TSH 3.620 10/06/2015   TSH 4.248 02/05/2013   TSH 3.189 06/30/2012           Assessment & Plan:   1) Type 2 diabetes mellitus without complication, with long-term current use of insulin ([place])  She presents today with her CGM showing improved glycemic profile overall.  She was not due for another A1c today.  Analysis of her CGM shows GMI of 7.9% (improvement from previous A1c of 10.2%) and TIR 50%, TAR 49%, TBR < 1%.  She has worked hard on developing a eating routine and has cut out sodas.  She denies any significant  hypoglycemia.  - Sarah Pope has currently uncontrolled symptomatic type 2 DM since 65 years of age, with most recent A1c of 10.2 %.   -Recent labs reviewed.  - I had a long discussion with her about the progressive nature of diabetes and the pathology behind its complications. -her diabetes is complicated by mild CKD, neuropathy and she remains at a high risk for more acute and chronic complications which include CAD, CVA, CKD, retinopathy, and neuropathy. These are all discussed in detail with her.  - Nutritional counseling repeated at each appointment due to patients tendency to fall back in to old habits.  - The patient admits there is a room for improvement in their diet and drink choices. -  Suggestion is made for the patient to avoid simple carbohydrates from their diet including Cakes, Sweet Desserts / Pastries, Ice Cream, Soda (diet and regular), Sweet Tea, Candies, Chips, Cookies, Sweet Pastries, Store Bought Juices, Alcohol in Excess of 1-2 drinks a day, Artificial Sweeteners, Coffee Creamer, and "Sugar-free" Products. This will help patient to have stable blood glucose profile and potentially avoid unintended weight gain.   - I encouraged the patient to switch to unprocessed or minimally processed complex starch and increased protein intake (animal or plant source), fruits, and vegetables.   - Patient is advised to stick to a routine mealtimes to eat 3 meals a day and avoid unnecessary snacks (to snack only to correct hypoglycemia).  - I have approached her with the following individualized plan to manage her diabetes and patient agrees:   -Based on her improving glycemic profile, no changes will be made to her medication regimen today.  She is advised to continue Lantus 80 units SQ nightly and change how she takes her Humalog to 12-18  units TID with meals if glucose is above 90 and she is eating (Specific instructions on how to titrate insulin dosage based on glucose readings  given to patient in writing).  She can also continue Metformin 1000 mg po twice daily with meals.  -she is encouraged to continue using her CGM to monitor glucose 4 times daily, before meals and before bed, and to call the clinic if she has readings less than 70 or above 300 for 3 tests in a row.  - she is warned not to take insulin without proper monitoring per orders. - Adjustment parameters are given to her for hypo and hyperglycemia in writing.  -She has allergy to sulfa meds.  - she will be considered for incretin therapy as appropriate next visit.  - Specific targets for  A1c; LDL, HDL, and Triglycerides were discussed with the patient.  2) Blood Pressure /Hypertension:  her blood pressure is controlled to target.   she is advised to continue her current medications including HCTZ 12.5 mg po daily and Losartan 50 mg po daily.  3) Lipids/Hyperlipidemia:    Review of her recent lipid panel from 04/18/21 showed controlled LDL at 92 and slightly elevated triglycerides of 159 .  she is advised to continue Lipitor 20 mg daily at bedtime.  Side effects and precautions discussed with her.  4)  Weight/Diet:  her Body mass index is 33.68 kg/m.  -  clearly complicating her diabetes care.   she is a candidate for weight loss. I discussed with her the fact that loss of 5 - 10% of her  current body weight will have the most impact on her diabetes management.  Exercise, and detailed carbohydrates information provided  -  detailed on discharge instructions.  5) Chronic Care/Health Maintenance: -she is on ACEI/ARB and Statin medications and is encouraged to initiate and continue to follow up with Ophthalmology, Dentist, Podiatrist at least yearly or according to recommendations, and advised to stay away from smoking. I have recommended yearly flu vaccine and pneumonia vaccine at least every 5 years; moderate intensity exercise for up to 150 minutes weekly; and sleep for at least 7 hours a day.  - she  is advised to maintain close follow up with  Nation, MD for primary care needs, as well as her other providers for optimal and coordinated care.     I spent 37 minutes in the care of the patient today including review of labs from [facility], Lipids, Thyroid Function, Hematology (current and previous including abstractions from other facilities); face-to-face time discussing  her blood glucose readings/logs, discussing hypoglycemia and hyperglycemia episodes and symptoms, medications doses, her options of short and long term treatment based on the latest standards of care / guidelines;  discussion about incorporating lifestyle medicine;  and documenting the encounter.    Please refer to Patient Instructions for Blood Glucose Monitoring and Insulin/Medications Dosing Guide"  in media tab for additional information. Please  also refer to " Patient Self Inventory" in the Media  tab for reviewed elements of pertinent patient history.  Sarah Pope participated in the discussions, expressed understanding, and voiced agreement with the above plans.  All questions were answered to her satisfaction. she is encouraged to contact clinic should she have any questions or concerns prior to her return visit.     Follow up plan: - Return in about 3 months (around 11/17/2021) for Diabetes F/U with A1c in office, No previsit labs, Bring meter and logs.    Sophie Tamez  Marinus Maw [facility] [address] [place], [place] [address] Phone: [phone] Fax: [phone]  08/17/2021, 9:01 AM

## 2021-09-02 ENCOUNTER — Other Ambulatory Visit (HOSPITAL_COMMUNITY): Payer: Self-pay | Admitting: Internal Medicine

## 2021-09-02 DIAGNOSIS — Z1231 Encounter for screening mammogram for malignant neoplasm of breast: Secondary | ICD-10-CM

## 2021-10-03 ENCOUNTER — Encounter: Payer: Self-pay | Admitting: Cardiology

## 2021-10-03 ENCOUNTER — Ambulatory Visit (INDEPENDENT_AMBULATORY_CARE_PROVIDER_SITE_OTHER): Payer: 59 | Admitting: Cardiology

## 2021-10-03 VITALS — BP 134/70 | HR 84 | Ht 65.0 in | Wt 202.4 lb

## 2021-10-03 DIAGNOSIS — I10 Essential (primary) hypertension: Secondary | ICD-10-CM | POA: Diagnosis not present

## 2021-10-03 DIAGNOSIS — I340 Nonrheumatic mitral (valve) insufficiency: Secondary | ICD-10-CM

## 2021-10-03 DIAGNOSIS — I350 Nonrheumatic aortic (valve) stenosis: Secondary | ICD-10-CM

## 2021-10-03 NOTE — Patient Instructions (Signed)

## 2021-10-03 NOTE — Progress Notes (Signed)
Clinical Summary Sarah Pope is a 65 y.o.female seen today for follow up of the following medical problems.    1. Aortic stenosis - Jan 2014 echo LVEF 55-60%, mild AV and MV anular calcification   05/2020 echo LVEF 65-70%, mod to severe AS (AVA VTI 0.77 mean 30 DI 0.30)     11/2020 echo LVEF 55-60%, mod to severe AS mean grad 30, AVA VTI 0.92, DI 0.24, SVI 38 -recent SOB/DOE. Example walking trashcans to the road now DOE.  - slight chest pains with activity - no recent LE edema.      2. HTN - compliant with meds   3. Carotid stenosis - 11/2020 carotid US: RICA 5-18%, LICA NL   Past Medical History:  Diagnosis Date   Acute respiratory failure with hypercapnia (HCC)    Allergy    Anxiety    Asthma    Bronchitis with bronchospasm    Cancer (HCC)    skin   Depression    DM type 2 (diabetes mellitus, type 2) ([place])    FATTY LIVER DISEASE 08/05/2008   GERD (gastroesophageal reflux disease)    Hyperlipidemia    Hypertension    Hypothyroidism    Kidney stones    Leukocytosis    Obesity, Class III, BMI 40-49.9 (morbid obesity) ([place])    PERSONAL HX COLONIC POLYPS 09/01/2008   Seasonal allergies    TRANSAMINASES, SERUM, ELEVATED 08/05/2008     Allergies  Allergen Reactions   Codeine Nausea Only   Sulfa Antibiotics     itching     Current Outpatient Medications  Medication Sig Dispense Refill   albuterol (VENTOLIN HFA) 108 (90 Base) MCG/ACT inhaler INHALE TWO PUFFS INTO THE LUNGS EVERY 6 HOURS AS NEEDED FOR WHEEZING OR SHORTNESS OF BREATH 18 g 2   aspirin EC 81 MG tablet Take 81 mg by mouth daily.     atorvastatin (LIPITOR) 20 MG tablet Take 1 tablet (20 mg total) by mouth daily. 90 tablet 1   cetirizine (ZYRTEC) 10 MG tablet Take 10 mg by mouth daily.     ciclopirox (PENLAC) 8 % solution Apply topically at bedtime. Apply over nail and surrounding skin. Apply daily over previous coat. After seven (7) days, may remove with alcohol and continue cycle. 6.6 mL 2    citalopram (CELEXA) 40 MG tablet Take 1 tablet (40 mg total) by mouth daily. 90 tablet 1   Continuous Blood Gluc Receiver (FREESTYLE LIBRE 14 DAY READER) DEVI USE AS DIRECTED THREE TIMES DAILY. 1 each PRN   Continuous Blood Gluc Sensor (FREESTYLE LIBRE 14 DAY SENSOR) MISC USE AS DIRECTED THREE TIMES DAILY WITH SLIDING SCALE INSULIN USE. 2 each PRN   diclofenac (VOLTAREN) 75 MG EC tablet TAKE ONE TABLET BY MOUTH TWICE daily PRN pain. TAKE WITH FOOD. 28 tablet 5   Fluticasone-Salmeterol (ADVAIR DISKUS) 250-50 MCG/DOSE AEPB Inhale 1 puff into the lungs 2 (two) times daily. 1 each 3   hydrochlorothiazide (HYDRODIURIL) 12.5 MG tablet Take 1 tablet (12.5 mg total) by mouth daily. 90 tablet 1   insulin glargine (LANTUS) 100 unit/mL SOPN Inject 80 Units into the skin daily. 15 mL 2   insulin lispro (HUMALOG) 100 UNIT/ML injection Inject 0.12-0.18 mLs (12-18 Units total) into the skin 3 (three) times daily with meals. 10 mL 1   levothyroxine (SYNTHROID) 175 MCG tablet Take 1 tablet (175 mcg total) by mouth daily. 90 tablet 1   losartan (COZAAR) 50 MG tablet Take 1 tablet (50 mg total)  by mouth daily. Needs labs. 90 tablet 0   metFORMIN (GLUCOPHAGE) 1000 MG tablet Take 1 tablet (1,000 mg total) by mouth 2 (two) times daily with a meal. 180 tablet 1   omeprazole (PRILOSEC) 40 MG capsule Take 1 capsule (40 mg total) by mouth daily. 90 capsule 1   No current facility-administered medications for this visit.     Past Surgical History:  Procedure Laterality Date   COLONOSCOPY  2013   Repeat 10 years   MANDIBLE SURGERY  1988   realignment   NOSE SURGERY  2007   sinus infections   POLYPECTOMY     skin cancer removal     TONSILLECTOMY       Allergies  Allergen Reactions   Codeine Nausea Only   Sulfa Antibiotics     itching      Family History  Problem Relation Age of Onset   Cancer Mother    Hypertension Mother    Thyroid disease Mother    Breast cancer Mother    Diabetes Mother     Prostate cancer Father    Heart attack Maternal Grandmother    Allergies Daughter    Colon cancer Neg Hx    Esophageal cancer Neg Hx    Rectal cancer Neg Hx    Stomach cancer Neg Hx    Colon polyps Neg Hx      Social History Sarah Pope reports that she quit smoking about 38 years ago. Her smoking use included cigarettes. She has a 2.50 pack-year smoking history. She has never used smokeless tobacco. Sarah Pope reports no history of alcohol use.   Review of Systems CONSTITUTIONAL: No weight loss, fever, chills, weakness or fatigue.  HEENT: Eyes: No visual loss, blurred vision, double vision or yellow sclerae.No hearing loss, sneezing, congestion, runny nose or sore throat.  SKIN: No rash or itching.  CARDIOVASCULAR: per hpi RESPIRATORY: No shortness of breath, cough or sputum.  GASTROINTESTINAL: No anorexia, nausea, vomiting or diarrhea. No abdominal pain or blood.  GENITOURINARY: No burning on urination, no polyuria NEUROLOGICAL: No headache, dizziness, syncope, paralysis, ataxia, numbness or tingling in the extremities. No change in bowel or bladder control.  MUSCULOSKELETAL: No muscle, back pain, joint pain or stiffness.  LYMPHATICS: No enlarged nodes. No history of splenectomy.  PSYCHIATRIC: No history of depression or anxiety.  ENDOCRINOLOGIC: No reports of sweating, cold or heat intolerance. No polyuria or polydipsia.  Marland Kitchen   Physical Examination Today's Vitals   10/03/21 1355  BP: 134/70  Pulse: 84  SpO2: 97%  Weight: 202 lb 6.4 oz (91.8 kg)  Height: 5\' 5"  (1.651 m)   Body mass index is 33.68 kg/m.  Gen: resting comfortably, no acute distress HEENT: no scleral icterus, pupils equal round and reactive, no palptable cervical adenopathy,  CV: RRR, 3/6 systolc murmur rusb, no jvd Resp: Clear to auscultation bilaterally GI: abdomen is soft, non-tender, non-distended, normal bowel sounds, no hepatosplenomegaly MSK: extremities are warm, no edema.  Skin: warm, no  rash Neuro:  no focal deficits Psych: appropriate affect   Diagnostic Studies  05/2020 echo IMPRESSIONS     1. Left ventricular ejection fraction, by estimation, is 65 to 70%. The  left ventricle has normal function. The left ventricle has no regional  wall motion abnormalities. There is mild left ventricular hypertrophy.  Left ventricular diastolic parameters  are consistent with Grade I diastolic dysfunction (impaired relaxation).   2. Right ventricular systolic function is normal. The right ventricular  size is normal. There is  normal pulmonary artery systolic pressure. The  estimated right ventricular systolic pressure is 28.4 mmHg.   3. The mitral valve is grossly normal. Trivial mitral valve  regurgitation.   4. The aortic valve is tricuspid, moderately calcified with decreased  cusp excursion. Aortic valve regurgitation is trivial. Moderate to severe  aortic valve stenosis. Aortic valve area, by VTI measures 0.77 cm. Aortic  valve mean gradient measures 29.5  mmHg. Aortic valve Vmax measures 3.64 m/s. Dimentionless index 0.30.   5. The inferior vena cava is normal in size with greater than 50%  respiratory variability, suggesting right atrial pressure of 3 mmHg.   11/2020 echo IMPRESSIONS     1. Left ventricular ejection fraction, by estimation, is 55 to 60%. The  left ventricle has normal function. The left ventricle has no regional  wall motion abnormalities. Left ventricular diastolic parameters are  consistent with Grade I diastolic  dysfunction (impaired relaxation). Elevated left atrial pressure.   2. Right ventricular systolic function is normal. The right ventricular  size is normal.   3. The mitral valve is normal in structure. No evidence of mitral valve  regurgitation. No evidence of mitral stenosis.   4. The aortic valve is tricuspid. There is mild calcification of the  aortic valve. There is mild thickening of the aortic valve. Aortic valve   regurgitation is not visualized. Moderate to severe aortic valve stenosis.  Aortic valve mean gradient measures  29.6 mmHg. Aortic valve peak gradient measures 49.4 mmHg. Aortic valve  area, by VTI measures 0.92 cm.    Assessment and Plan  1. Aortic stenosis - moderate to severe AS by echo favoring the severe end of the spectrum - today she reports some DOE for the first time, I suspect her valve has progressed - repeat echo, would anticipate pending results likely needing heart catheterization and valve clinic evaluation      2. HTN - essentially at goal, continue current meds      Arnoldo Lenis, M.D.

## 2021-10-10 ENCOUNTER — Ambulatory Visit (HOSPITAL_COMMUNITY)
Admission: RE | Admit: 2021-10-10 | Discharge: 2021-10-10 | Disposition: A | Discharge status: Home / Self Care | Payer: 59 | Source: Ambulatory Visit | Attending: Internal Medicine | Admitting: Internal Medicine

## 2021-10-10 ENCOUNTER — Other Ambulatory Visit: Payer: Self-pay

## 2021-10-10 DIAGNOSIS — Z1231 Encounter for screening mammogram for malignant neoplasm of breast: Secondary | ICD-10-CM | POA: Insufficient documentation

## 2021-10-12 ENCOUNTER — Emergency Department (HOSPITAL_COMMUNITY): Payer: 59

## 2021-10-12 ENCOUNTER — Encounter (HOSPITAL_COMMUNITY): Payer: Self-pay

## 2021-10-12 ENCOUNTER — Emergency Department (HOSPITAL_COMMUNITY)
Admission: EM | Admit: 2021-10-12 | Discharge: 2021-10-12 | Disposition: A | Discharge status: Home / Self Care | Payer: 59 | Attending: Emergency Medicine | Admitting: Emergency Medicine

## 2021-10-12 DIAGNOSIS — I10 Essential (primary) hypertension: Secondary | ICD-10-CM | POA: Diagnosis not present

## 2021-10-12 DIAGNOSIS — Z20822 Contact with and (suspected) exposure to covid-19: Secondary | ICD-10-CM | POA: Insufficient documentation

## 2021-10-12 DIAGNOSIS — Z79899 Other long term (current) drug therapy: Secondary | ICD-10-CM | POA: Insufficient documentation

## 2021-10-12 DIAGNOSIS — J4541 Moderate persistent asthma with (acute) exacerbation: Secondary | ICD-10-CM | POA: Diagnosis not present

## 2021-10-12 DIAGNOSIS — Z7982 Long term (current) use of aspirin: Secondary | ICD-10-CM | POA: Diagnosis not present

## 2021-10-12 DIAGNOSIS — Z7984 Long term (current) use of oral hypoglycemic drugs: Secondary | ICD-10-CM | POA: Diagnosis not present

## 2021-10-12 DIAGNOSIS — E039 Hypothyroidism, unspecified: Secondary | ICD-10-CM | POA: Diagnosis not present

## 2021-10-12 DIAGNOSIS — J45901 Unspecified asthma with (acute) exacerbation: Secondary | ICD-10-CM

## 2021-10-12 DIAGNOSIS — Z794 Long term (current) use of insulin: Secondary | ICD-10-CM | POA: Diagnosis not present

## 2021-10-12 DIAGNOSIS — E119 Type 2 diabetes mellitus without complications: Secondary | ICD-10-CM | POA: Diagnosis not present

## 2021-10-12 DIAGNOSIS — Z87891 Personal history of nicotine dependence: Secondary | ICD-10-CM | POA: Insufficient documentation

## 2021-10-12 DIAGNOSIS — R0602 Shortness of breath: Secondary | ICD-10-CM | POA: Diagnosis present

## 2021-10-12 LAB — BASIC METABOLIC PANEL
Anion gap: 8 (ref 5–15)
BUN: 12 mg/dL (ref 8–23)
CO2: 27 mmol/L (ref 22–32)
Calcium: 9.1 mg/dL (ref 8.9–10.3)
Chloride: 106 mmol/L (ref 98–111)
Creatinine, Ser: 0.71 mg/dL (ref 0.44–1.00)
GFR, Estimated: >60 mL/min (ref >60)
Glucose, Bld: 202 mg/dL — ABNORMAL HIGH (ref 70–99)
Potassium: 4 mmol/L (ref 3.5–5.1)
Sodium: 141 mmol/L (ref 135–145)

## 2021-10-12 LAB — CBC
HCT: 37.8 % (ref 36.0–46.0)
Hemoglobin: 12 g/dL (ref 12.0–15.0)
MCH: 25.9 pg — ABNORMAL LOW (ref 26.0–34.0)
MCHC: 31.7 g/dL (ref 30.0–36.0)
MCV: 81.5 fL (ref 80.0–100.0)
Platelets: 230 10*3/uL (ref 150–400)
RBC: 4.64 MIL/uL (ref 3.87–5.11)
RDW: 13.2 % (ref 11.5–15.5)
WBC: 9.7 10*3/uL (ref 4.0–10.5)
nRBC: 0 % (ref 0.0–0.2)

## 2021-10-12 LAB — RESP PANEL BY RT-PCR (FLU A&B, COVID) ARPGX2
Influenza A by PCR: NEGATIVE
Influenza B by PCR: NEGATIVE
SARS Coronavirus 2 by RT PCR: NEGATIVE

## 2021-10-12 LAB — BRAIN NATRIURETIC PEPTIDE: B Natriuretic Peptide: 79 pg/mL (ref 0.0–100.0)

## 2021-10-12 MED ORDER — IPRATROPIUM BROMIDE 0.02 % IN SOLN: 0.5000 mg | Freq: Once | RESPIRATORY_TRACT | Status: DC

## 2021-10-12 MED ORDER — ACETAMINOPHEN 325 MG PO TABS
650.0000 mg | ORAL_TABLET | Freq: Once | ORAL | Status: AC
  Administered 2021-10-12: 19:00:00 650 mg via ORAL
  Filled 2021-10-12: qty 2

## 2021-10-12 MED ORDER — ALBUTEROL SULFATE (2.5 MG/3ML) 0.083% IN NEBU
2.5000 mg | INHALATION_SOLUTION | Freq: Once | RESPIRATORY_TRACT | Status: AC
  Administered 2021-10-12: 17:00:00 2.5 mg via RESPIRATORY_TRACT
  Filled 2021-10-12: qty 3

## 2021-10-12 MED ORDER — ALBUTEROL SULFATE (2.5 MG/3ML) 0.083% IN NEBU: 5.0000 mg | INHALATION_SOLUTION | Freq: Once | RESPIRATORY_TRACT | Status: DC

## 2021-10-12 MED ORDER — ALBUTEROL SULFATE (2.5 MG/3ML) 0.083% IN NEBU
2.5000 mg | INHALATION_SOLUTION | Freq: Once | RESPIRATORY_TRACT | Status: AC
  Administered 2021-10-12: 19:00:00 2.5 mg via RESPIRATORY_TRACT
  Filled 2021-10-12: qty 3

## 2021-10-12 MED ORDER — IPRATROPIUM-ALBUTEROL 0.5-2.5 (3) MG/3ML IN SOLN
3.0000 mL | Freq: Once | RESPIRATORY_TRACT | Status: AC
  Administered 2021-10-12: 17:00:00 3 mL via RESPIRATORY_TRACT
  Filled 2021-10-12: qty 3

## 2021-10-12 MED ORDER — METHYLPREDNISOLONE SODIUM SUCC 125 MG IJ SOLR
125.0000 mg | Freq: Once | INTRAMUSCULAR | Status: AC
  Administered 2021-10-12: 17:00:00 125 mg via INTRAVENOUS
  Filled 2021-10-12: qty 2

## 2021-10-12 NOTE — ED Provider Notes (Signed)
[facility] EMERGENCY DEPARTMENT Provider Note   CSN: [id number] Arrival date & time: 10/12/21  1547     History Chief complaint: Shortness of breath  Sarah Pope is a 65 y.o. female.  HPI  Patient presents to the ED with complaints of coughing congestion and shortness of breath.  Patient states a couple weeks ago she was diagnosed with the flu.  Over the last several days she has had some worsening symptoms of feeling short of breath and coughing a lot.  Patient states she went to see her doctor yesterday and was started on antibiotics, steroids and inhalers.  Patient states today she was feeling even worse.  She was feeling short of breath and weak.  She had an episode of nausea and vomiting.  She has a bad headache.  Because of her worsening symptoms, she came to the ED for evaluation  Past Medical History:  Diagnosis Date   Acute respiratory failure with hypercapnia (HCC)    Allergy    Anxiety    Asthma    Bronchitis with bronchospasm    Cancer ([place])    skin   Depression    DM type 2 (diabetes mellitus, type 2) ([place])    FATTY LIVER DISEASE 08/05/2008   GERD (gastroesophageal reflux disease)    Hyperlipidemia    Hypertension    Hypothyroidism    Kidney stones    Leukocytosis    Obesity, Class III, BMI 40-49.9 (morbid obesity) ([place])    PERSONAL HX COLONIC POLYPS 09/01/2008   Seasonal allergies    TRANSAMINASES, SERUM, ELEVATED 08/05/2008    Patient Active Problem List   Diagnosis Date Noted   Insomnia 09/27/2020   Fall (on) (from) other stairs and steps, initial encounter 08/31/2020   Dysuria 08/31/2020   Acute cystitis without hematuria 08/31/2020   Depression with anxiety 04/10/2018   Hypomagnesemia 10/05/2016   Varicose veins of both lower extremities 10/05/2016   Screening for colon cancer 10/04/2016   Depression 09/29/2015   Hyperlipidemia LDL goal <100 02/05/2013   OSA (obstructive sleep apnea) 12/02/2012   GERD (gastroesophageal reflux disease)  10/23/2012   Moderate persistent asthma with reflux disease 09/02/2012   DM type 2 (diabetes mellitus, type 2) ([place]) 06/30/2012   HTN (hypertension) 06/30/2012   Seasonal allergies 06/30/2012   Hypothyroidism 06/30/2012   Elevated LFTs 06/30/2012   OBESITY 08/05/2008    Past Surgical History:  Procedure Laterality Date   COLONOSCOPY  2013   Repeat 10 years   [place]   realignment   NOSE SURGERY  2007   sinus infections   POLYPECTOMY     skin cancer removal     TONSILLECTOMY       OB History   No obstetric history on file.     Family History  Problem Relation Age of Onset   Cancer Mother    Hypertension Mother    Thyroid disease Mother    Breast cancer Mother    Diabetes Mother    Prostate cancer Father    Heart attack Maternal Grandmother    Allergies Daughter    Colon cancer Neg Hx    Esophageal cancer Neg Hx    Rectal cancer Neg Hx    Stomach cancer Neg Hx    Colon polyps Neg Hx     Social History   Tobacco Use   Smoking status: Former    Packs/day: 0.50    Years: 5.00    Pack years: 2.50    Types:  Cigarettes    Quit date: 10/23/1982    Years since quitting: 38.9   Smokeless tobacco: Never  Vaping Use   Vaping Use: Never used  Substance Use Topics   Alcohol use: No   Drug use: No    Home Medications Prior to Admission medications   Medication Sig Start Date End Date Taking? Authorizing Provider  albuterol (VENTOLIN HFA) 108 (90 Base) MCG/ACT inhaler INHALE TWO PUFFS INTO THE LUNGS EVERY 6 HOURS AS NEEDED FOR WHEEZING OR SHORTNESS OF BREATH Patient taking differently: Inhale 2 puffs into the lungs every 6 (six) hours as needed for shortness of breath. 05/18/21  Yes Lovena Le, Malena M, DO  aspirin EC 81 MG tablet Take 81 mg by mouth daily.   Yes [provider]  atorvastatin (LIPITOR) 20 MG tablet Take 1 tablet (20 mg total) by mouth daily. 04/18/21  Yes Lovena Le, Malena M, DO  cetirizine (ZYRTEC) 10 MG tablet Take 10 mg by mouth  daily.   Yes [provider]  ciclopirox (PENLAC) 8 % solution Apply topically at bedtime. Apply over nail and surrounding skin. Apply daily over previous coat. After seven (7) days, may remove with alcohol and continue cycle. 02/22/21  Yes Trula Slade, DPM  citalopram (CELEXA) 40 MG tablet Take 1 tablet (40 mg total) by mouth daily. 04/18/21  Yes Lovena Le, Malena M, DO  diclofenac (VOLTAREN) 75 MG EC tablet TAKE ONE TABLET BY MOUTH TWICE daily PRN pain. TAKE WITH FOOD. 04/18/21  Yes Lovena Le, Malena M, DO  Fluticasone-Salmeterol (ADVAIR DISKUS) 250-50 MCG/DOSE AEPB Inhale 1 puff into the lungs 2 (two) times daily. 08/16/20  Yes Lovena Le, Malena M, DO  hydrochlorothiazide (HYDRODIURIL) 12.5 MG tablet Take 1 tablet (12.5 mg total) by mouth daily. 04/18/21  Yes Lovena Le, Malena M, DO  insulin glargine (LANTUS) 100 unit/mL SOPN Inject 80 Units into the skin daily. 04/18/21  Yes Lovena Le, Malena M, DO  insulin lispro (HUMALOG) 100 UNIT/ML injection Inject 0.12-0.18 mLs (12-18 Units total) into the skin 3 (three) times daily with meals. 08/01/21  Yes Brita Romp, NP  levothyroxine (SYNTHROID) 175 MCG tablet Take 1 tablet (175 mcg total) by mouth daily. 04/18/21  Yes Lovena Le, Malena M, DO  losartan (COZAAR) 50 MG tablet Take 1 tablet (50 mg total) by mouth daily. Needs labs. 04/18/21  Yes Lovena Le, Malena M, DO  metFORMIN (GLUCOPHAGE) 1000 MG tablet Take 1 tablet (1,000 mg total) by mouth 2 (two) times daily with a meal. 04/18/21  Yes Lovena Le, Malena M, DO  omeprazole (PRILOSEC) 40 MG capsule Take 1 capsule (40 mg total) by mouth daily. 04/18/21  Yes Lovena Le, Malena M, DO  Continuous Blood Gluc Receiver (FREESTYLE LIBRE 14 DAY READER) DEVI USE AS DIRECTED THREE TIMES DAILY. 09/15/20   Elvia Collum M, DO  Continuous Blood Gluc Sensor (FREESTYLE LIBRE 14 DAY SENSOR) MISC USE AS DIRECTED THREE TIMES DAILY WITH SLIDING SCALE INSULIN USE. 09/15/20   Elvia Collum M, DO    Allergies    Codeine and Sulfa  antibiotics  Review of Systems   Review of Systems  All other systems reviewed and are negative.`  Physical Exam Updated Vital Signs BP (!) 136/48    Pulse 94    Temp 97.9 F (36.6 C) (Oral)    Resp (!) 21    Ht 1.651 m (5\' 5" )    Wt 92 kg    SpO2 96%    BMI 33.75 kg/m   Physical Exam Vitals and nursing note reviewed.  Constitutional:  General: She is not in acute distress.    Appearance: She is well-developed.  HENT:     Head: Normocephalic and atraumatic.     Right Ear: External ear normal.     Left Ear: External ear normal.  Eyes:     General: No scleral icterus.       Right eye: No discharge.        Left eye: No discharge.     Conjunctiva/sclera: Conjunctivae normal.  Neck:     Trachea: No tracheal deviation.  Cardiovascular:     Rate and Rhythm: Normal rate and regular rhythm.  Pulmonary:     Effort: Pulmonary effort is normal. No respiratory distress.     Breath sounds: No stridor. Wheezing present. No rales.  Abdominal:     General: Bowel sounds are normal. There is no distension.     Palpations: Abdomen is soft.     Tenderness: There is no abdominal tenderness. There is no guarding or rebound.  Musculoskeletal:        General: No tenderness or deformity.     Cervical back: Neck supple.  Skin:    General: Skin is warm and dry.     Findings: No rash.  Neurological:     General: No focal deficit present.     Mental Status: She is alert.     Cranial Nerves: No cranial nerve deficit (no facial droop, extraocular movements intact, no slurred speech).     Sensory: No sensory deficit.     Motor: No abnormal muscle tone or seizure activity.     Coordination: Coordination normal.  Psychiatric:        Mood and Affect: Mood normal.    ED Results / Procedures / Treatments   Labs (all labs ordered are listed, but only abnormal results are displayed) Labs Reviewed  CBC - Abnormal; Notable for the following components:      Result Value   MCH 25.9 (*)    All  other components within normal limits  BASIC METABOLIC PANEL - Abnormal; Notable for the following components:   Glucose, Bld 202 (*)    All other components within normal limits  RESP PANEL BY RT-PCR (FLU A&B, COVID) ARPGX2  BRAIN NATRIURETIC PEPTIDE    EKG None  Radiology DG Chest Port 1 View  Result Date: 10/12/2021 CLINICAL DATA:  Shortness of breath for several days, initial encounter EXAM: PORTABLE CHEST 1 VIEW COMPARISON:  05/31/2020 FINDINGS: The heart size and mediastinal contours are within normal limits. Both lungs are clear. The visualized skeletal structures are unremarkable. IMPRESSION: No active disease. Electronically Signed   By: Inez Catalina M.D.   On: 10/12/2021 17:40    Procedures Procedures   Medications Ordered in ED Medications  methylPREDNISolone sodium succinate (SOLU-MEDROL) 125 mg/2 mL injection 125 mg (125 mg Intravenous Given 10/12/21 1655)  ipratropium-albuterol (DUONEB) 0.5-2.5 (3) MG/3ML nebulizer solution 3 mL (3 mLs Nebulization Given 10/12/21 1724)  albuterol (PROVENTIL) (2.5 MG/3ML) 0.083% nebulizer solution 2.5 mg (2.5 mg Nebulization Given 10/12/21 1724)  albuterol (PROVENTIL) (2.5 MG/3ML) 0.083% nebulizer solution 2.5 mg (2.5 mg Nebulization Given 10/12/21 1921)  acetaminophen (TYLENOL) tablet 650 mg (650 mg Oral Given 10/12/21 1906)    ED Course  I have reviewed the triage vital signs and the nursing notes.  Pertinent labs & imaging results that were available during my care of the patient were reviewed by me and considered in my medical decision making (see chart for details).  Clinical Course as of 10/12/21  1928  Wed Oct 12, 2021  4174 CBC and metabolic panel normal [JK]  1753 Chest x-ray without signs of pneumonia [JK]  1753 BNP normal [JK]  0814 CBC and metabolic panel unremarkable [JK]  1805 Patient feeling better after initial breathing treatment. [GY]  1856 COVID and flu are negative [JK]    Clinical Course User Index [JK]  Dorie Rank, MD   MDM Rules/Calculators/A&P                         Patient has known history of aortic stenosis.  According to her her echo in February 2022 she was noted to have moderate to severe AS.  Could contribute to her shortness of breath although on exam she has significant wheezing and suspect this is more pulmonary etiology at this time.  Pt improved with breathing treatments.  Doubt sx related to acute decompensation of her known AS.  Wheezing on exam and improvement with breathing treatments suggest that is the primary issue this evening.  Vitals normal.  No indication for hospitalization at this time.    Final Clinical Impression(s) / ED Diagnoses Final diagnoses:  Moderate asthma with exacerbation, unspecified whether persistent    Rx / DC Orders ED Discharge Orders     None        Dorie Rank, MD 10/12/21 [id number]

## 2021-10-12 NOTE — Discharge Instructions (Signed)
Take the antibiotics and steroids that you were prescribed the other day.  Continue your breathing treatments as needed.  Return to the ER as needed for worsening symptoms otherwise follow-up with your cardiologist for continued planning regarding your aortic stenosis

## 2021-10-12 NOTE — ED Triage Notes (Signed)
Pt coughing, congested and not feeling bad for 3 days, skin warm and dry, pt pale, daughter reports cough for a month.  Pt has a headache/

## 2021-10-19 ENCOUNTER — Other Ambulatory Visit: Payer: Self-pay

## 2021-10-19 ENCOUNTER — Ambulatory Visit (HOSPITAL_COMMUNITY)
Admission: RE | Admit: 2021-10-19 | Discharge: 2021-10-19 | Disposition: A | Discharge status: Home / Self Care | Payer: 59 | Source: Ambulatory Visit | Attending: Cardiology | Admitting: Cardiology

## 2021-10-19 DIAGNOSIS — I350 Nonrheumatic aortic (valve) stenosis: Secondary | ICD-10-CM | POA: Insufficient documentation

## 2021-10-19 LAB — ECHOCARDIOGRAM COMPLETE
AR max vel: 0.62 cm2
AV Area VTI: 0.57 cm2
AV Area mean vel: 0.6 cm2
AV Mean grad: 28.7 mmHg
AV Peak grad: 54 mmHg
Ao pk vel: 3.67 m/s
Area-P 1/2: 2.5 cm2
Calc EF: 49.9 %
MV VTI: 1.73 cm2
P 1/2 time: 353 msec
S' Lateral: 2.6 cm
Single Plane A2C EF: 51.6 %
Single Plane A4C EF: 50.3 %

## 2021-10-19 NOTE — Progress Notes (Incomplete)
*  PRELIMINARY RESULTS* Echocardiogram 2D Echocardiogram has been performed.  Sarah Pope 10/19/2021, 2:19 PM

## 2021-10-25 ENCOUNTER — Observation Stay (HOSPITAL_COMMUNITY): Payer: Medicaid Other

## 2021-10-25 ENCOUNTER — Encounter (HOSPITAL_COMMUNITY): Payer: Self-pay | Admitting: *Deleted

## 2021-10-25 ENCOUNTER — Inpatient Hospital Stay (HOSPITAL_COMMUNITY)
Admission: EM | Admit: 2021-10-25 | Discharge: 2021-10-28 | DRG: 189 | Disposition: A | Discharge status: Home / Self Care | Payer: Medicaid Other | Attending: Internal Medicine | Admitting: Internal Medicine

## 2021-10-25 ENCOUNTER — Emergency Department (HOSPITAL_COMMUNITY): Payer: Medicaid Other

## 2021-10-25 DIAGNOSIS — Z79899 Other long term (current) drug therapy: Secondary | ICD-10-CM

## 2021-10-25 DIAGNOSIS — E785 Hyperlipidemia, unspecified: Secondary | ICD-10-CM | POA: Diagnosis present

## 2021-10-25 DIAGNOSIS — Z79890 Hormone replacement therapy (postmenopausal): Secondary | ICD-10-CM

## 2021-10-25 DIAGNOSIS — J9601 Acute respiratory failure with hypoxia: Secondary | ICD-10-CM | POA: Diagnosis not present

## 2021-10-25 DIAGNOSIS — E1165 Type 2 diabetes mellitus with hyperglycemia: Secondary | ICD-10-CM | POA: Diagnosis present

## 2021-10-25 DIAGNOSIS — G4733 Obstructive sleep apnea (adult) (pediatric): Secondary | ICD-10-CM | POA: Diagnosis present

## 2021-10-25 DIAGNOSIS — E669 Obesity, unspecified: Secondary | ICD-10-CM | POA: Diagnosis present

## 2021-10-25 DIAGNOSIS — Z6833 Body mass index (BMI) 33.0-33.9, adult: Secondary | ICD-10-CM

## 2021-10-25 DIAGNOSIS — F418 Other specified anxiety disorders: Secondary | ICD-10-CM | POA: Diagnosis present

## 2021-10-25 DIAGNOSIS — Z8349 Family history of other endocrine, nutritional and metabolic diseases: Secondary | ICD-10-CM

## 2021-10-25 DIAGNOSIS — Z885 Allergy status to narcotic agent status: Secondary | ICD-10-CM

## 2021-10-25 DIAGNOSIS — E039 Hypothyroidism, unspecified: Secondary | ICD-10-CM | POA: Diagnosis present

## 2021-10-25 DIAGNOSIS — Z794 Long term (current) use of insulin: Secondary | ICD-10-CM

## 2021-10-25 DIAGNOSIS — E11649 Type 2 diabetes mellitus with hypoglycemia without coma: Secondary | ICD-10-CM | POA: Diagnosis not present

## 2021-10-25 DIAGNOSIS — E119 Type 2 diabetes mellitus without complications: Secondary | ICD-10-CM

## 2021-10-25 DIAGNOSIS — J4521 Mild intermittent asthma with (acute) exacerbation: Secondary | ICD-10-CM | POA: Diagnosis present

## 2021-10-25 DIAGNOSIS — I10 Essential (primary) hypertension: Secondary | ICD-10-CM | POA: Diagnosis present

## 2021-10-25 DIAGNOSIS — Z833 Family history of diabetes mellitus: Secondary | ICD-10-CM

## 2021-10-25 DIAGNOSIS — F419 Anxiety disorder, unspecified: Secondary | ICD-10-CM | POA: Diagnosis present

## 2021-10-25 DIAGNOSIS — Z7984 Long term (current) use of oral hypoglycemic drugs: Secondary | ICD-10-CM

## 2021-10-25 DIAGNOSIS — F32A Depression, unspecified: Secondary | ICD-10-CM | POA: Diagnosis present

## 2021-10-25 DIAGNOSIS — Z7951 Long term (current) use of inhaled steroids: Secondary | ICD-10-CM

## 2021-10-25 DIAGNOSIS — J45901 Unspecified asthma with (acute) exacerbation: Secondary | ICD-10-CM | POA: Diagnosis present

## 2021-10-25 DIAGNOSIS — T380X5A Adverse effect of glucocorticoids and synthetic analogues, initial encounter: Secondary | ICD-10-CM | POA: Diagnosis not present

## 2021-10-25 DIAGNOSIS — Z8249 Family history of ischemic heart disease and other diseases of the circulatory system: Secondary | ICD-10-CM

## 2021-10-25 DIAGNOSIS — Z882 Allergy status to sulfonamides status: Secondary | ICD-10-CM

## 2021-10-25 DIAGNOSIS — Z87891 Personal history of nicotine dependence: Secondary | ICD-10-CM

## 2021-10-25 DIAGNOSIS — Z85828 Personal history of other malignant neoplasm of skin: Secondary | ICD-10-CM

## 2021-10-25 DIAGNOSIS — Z20822 Contact with and (suspected) exposure to covid-19: Secondary | ICD-10-CM | POA: Diagnosis present

## 2021-10-25 DIAGNOSIS — Z7982 Long term (current) use of aspirin: Secondary | ICD-10-CM

## 2021-10-25 LAB — RESP PANEL BY RT-PCR (FLU A&B, COVID) ARPGX2
Influenza A by PCR: NEGATIVE
Influenza B by PCR: NEGATIVE
SARS Coronavirus 2 by RT PCR: NEGATIVE

## 2021-10-25 LAB — BASIC METABOLIC PANEL
Anion gap: 9 (ref 5–15)
BUN: 11 mg/dL (ref 8–23)
CO2: 31 mmol/L (ref 22–32)
Calcium: 9.3 mg/dL (ref 8.9–10.3)
Chloride: 99 mmol/L (ref 98–111)
Creatinine, Ser: 0.6 mg/dL (ref 0.44–1.00)
GFR, Estimated: >60 mL/min (ref >60)
Glucose, Bld: 222 mg/dL — ABNORMAL HIGH (ref 70–99)
Potassium: 4.3 mmol/L (ref 3.5–5.1)
Sodium: 139 mmol/L (ref 135–145)

## 2021-10-25 LAB — APTT: aPTT: 25 seconds (ref 24–36)

## 2021-10-25 LAB — HEMOGLOBIN A1C
Hgb A1c MFr Bld: 9 % — ABNORMAL HIGH (ref 4.8–5.6)
Mean Plasma Glucose: 211.6 mg/dL

## 2021-10-25 LAB — HIV ANTIBODY (ROUTINE TESTING W REFLEX): HIV Screen 4th Generation wRfx: NONREACTIVE

## 2021-10-25 LAB — PROTIME-INR
INR: 1 (ref 0.8–1.2)
Prothrombin Time: 13 seconds (ref 11.4–15.2)

## 2021-10-25 LAB — CBC
HCT: 43.1 % (ref 36.0–46.0)
Hemoglobin: 13.7 g/dL (ref 12.0–15.0)
MCH: 26.4 pg (ref 26.0–34.0)
MCHC: 31.8 g/dL (ref 30.0–36.0)
MCV: 83 fL (ref 80.0–100.0)
Platelets: 250 10*3/uL (ref 150–400)
RBC: 5.19 MIL/uL — ABNORMAL HIGH (ref 3.87–5.11)
RDW: 13.4 % (ref 11.5–15.5)
WBC: 13 10*3/uL — ABNORMAL HIGH (ref 4.0–10.5)
nRBC: 0 % (ref 0.0–0.2)

## 2021-10-25 LAB — LACTIC ACID, PLASMA: Lactic Acid, Venous: 1 mmol/L (ref 0.5–1.9)

## 2021-10-25 LAB — GLUCOSE, CAPILLARY
Glucose-Capillary: 290 mg/dL — ABNORMAL HIGH (ref 70–99)
Glucose-Capillary: 335 mg/dL — ABNORMAL HIGH (ref 70–99)

## 2021-10-25 LAB — BRAIN NATRIURETIC PEPTIDE: B Natriuretic Peptide: 77 pg/mL (ref 0.0–100.0)

## 2021-10-25 LAB — TROPONIN I (HIGH SENSITIVITY)
Troponin I (High Sensitivity): 8 ng/L (ref <18)
Troponin I (High Sensitivity): 8 ng/L (ref <18)

## 2021-10-25 MED ORDER — INSULIN ASPART 100 UNIT/ML IJ SOLN
4.0000 [IU] | Freq: Three times a day (TID) | INTRAMUSCULAR | Status: DC
  Administered 2021-10-25 – 2021-10-27 (×5): 4 [IU] via SUBCUTANEOUS

## 2021-10-25 MED ORDER — SODIUM CHLORIDE 0.9% FLUSH
3.0000 mL | Freq: Two times a day (BID) | INTRAVENOUS | Status: DC
  Administered 2021-10-25: 3 mL via INTRAVENOUS

## 2021-10-25 MED ORDER — MOMETASONE FURO-FORMOTEROL FUM 200-5 MCG/ACT IN AERO
2.0000 | INHALATION_SPRAY | Freq: Two times a day (BID) | RESPIRATORY_TRACT | Status: DC
  Administered 2021-10-25 – 2021-10-28 (×6): 2 via RESPIRATORY_TRACT
  Filled 2021-10-25: qty 8.8

## 2021-10-25 MED ORDER — SODIUM CHLORIDE 0.9 % IV SOLN: 250.0000 mL | INTRAVENOUS | Status: DC | PRN

## 2021-10-25 MED ORDER — HYDROCOD POLST-CPM POLST ER 10-8 MG/5ML PO SUER
10.0000 mL | Freq: Once | ORAL | Status: AC
  Administered 2021-10-25: 10 mL via ORAL
  Filled 2021-10-25: qty 10

## 2021-10-25 MED ORDER — IOHEXOL 350 MG/ML SOLN
100.0000 mL | Freq: Once | INTRAVENOUS | Status: AC | PRN
  Administered 2021-10-25: 80 mL via INTRAVENOUS

## 2021-10-25 MED ORDER — METHYLPREDNISOLONE SODIUM SUCC 125 MG IJ SOLR
125.0000 mg | Freq: Once | INTRAMUSCULAR | Status: AC
  Administered 2021-10-25: 125 mg via INTRAVENOUS
  Filled 2021-10-25: qty 2

## 2021-10-25 MED ORDER — LOSARTAN POTASSIUM 50 MG PO TABS: 50.0000 mg | ORAL_TABLET | Freq: Every day | ORAL | Status: DC

## 2021-10-25 MED ORDER — PANTOPRAZOLE SODIUM 40 MG PO TBEC
40.0000 mg | DELAYED_RELEASE_TABLET | Freq: Every day | ORAL | Status: DC
  Administered 2021-10-25 – 2021-10-28 (×4): 40 mg via ORAL
  Filled 2021-10-25 (×4): qty 1

## 2021-10-25 MED ORDER — ASPIRIN EC 81 MG PO TBEC
81.0000 mg | DELAYED_RELEASE_TABLET | Freq: Every day | ORAL | Status: DC
  Administered 2021-10-26 – 2021-10-28 (×3): 81 mg via ORAL
  Filled 2021-10-25 (×3): qty 1

## 2021-10-25 MED ORDER — HEPARIN SODIUM (PORCINE) 5000 UNIT/ML IJ SOLN
5000.0000 [IU] | Freq: Three times a day (TID) | INTRAMUSCULAR | Status: DC
  Administered 2021-10-25 – 2021-10-28 (×10): 5000 [IU] via SUBCUTANEOUS
  Filled 2021-10-25 (×9): qty 1

## 2021-10-25 MED ORDER — ALBUTEROL SULFATE HFA 108 (90 BASE) MCG/ACT IN AERS: 2.0000 | INHALATION_SPRAY | Freq: Four times a day (QID) | RESPIRATORY_TRACT | Status: DC | PRN

## 2021-10-25 MED ORDER — ATORVASTATIN CALCIUM 20 MG PO TABS
20.0000 mg | ORAL_TABLET | Freq: Every day | ORAL | Status: DC
  Administered 2021-10-25 – 2021-10-27 (×3): 20 mg via ORAL
  Filled 2021-10-25 (×3): qty 1

## 2021-10-25 MED ORDER — ALBUTEROL SULFATE (2.5 MG/3ML) 0.083% IN NEBU: 2.5000 mg | INHALATION_SOLUTION | RESPIRATORY_TRACT | Status: DC | PRN

## 2021-10-25 MED ORDER — ALBUTEROL SULFATE (2.5 MG/3ML) 0.083% IN NEBU
2.5000 mg | INHALATION_SOLUTION | Freq: Once | RESPIRATORY_TRACT | Status: AC
  Administered 2021-10-25: 2.5 mg via RESPIRATORY_TRACT

## 2021-10-25 MED ORDER — IPRATROPIUM-ALBUTEROL 0.5-2.5 (3) MG/3ML IN SOLN
3.0000 mL | Freq: Four times a day (QID) | RESPIRATORY_TRACT | Status: DC
  Administered 2021-10-25 – 2021-10-28 (×10): 3 mL via RESPIRATORY_TRACT
  Filled 2021-10-25 (×11): qty 3

## 2021-10-25 MED ORDER — TRAZODONE HCL 50 MG PO TABS: 50.0000 mg | ORAL_TABLET | Freq: Every evening | ORAL | Status: DC | PRN

## 2021-10-25 MED ORDER — METHYLPREDNISOLONE SODIUM SUCC 40 MG IJ SOLR
40.0000 mg | Freq: Two times a day (BID) | INTRAMUSCULAR | Status: DC
  Administered 2021-10-25 – 2021-10-28 (×6): 40 mg via INTRAVENOUS
  Filled 2021-10-25 (×6): qty 1

## 2021-10-25 MED ORDER — POLYETHYLENE GLYCOL 3350 17 G PO PACK: 17.0000 g | PACK | Freq: Every day | ORAL | Status: DC | PRN

## 2021-10-25 MED ORDER — LORATADINE 10 MG PO TABS
10.0000 mg | ORAL_TABLET | Freq: Every day | ORAL | Status: DC
  Administered 2021-10-25 – 2021-10-28 (×4): 10 mg via ORAL
  Filled 2021-10-25 (×4): qty 1

## 2021-10-25 MED ORDER — IPRATROPIUM-ALBUTEROL 0.5-2.5 (3) MG/3ML IN SOLN
3.0000 mL | Freq: Once | RESPIRATORY_TRACT | Status: AC
  Administered 2021-10-25: 3 mL via RESPIRATORY_TRACT

## 2021-10-25 MED ORDER — INSULIN GLARGINE-YFGN 100 UNIT/ML ~~LOC~~ SOLN
100.0000 [IU] | Freq: Every day | SUBCUTANEOUS | Status: DC
  Administered 2021-10-25 – 2021-10-26 (×2): 100 [IU] via SUBCUTANEOUS
  Filled 2021-10-25 (×4): qty 1

## 2021-10-25 MED ORDER — ONDANSETRON HCL 4 MG/2ML IJ SOLN: 4.0000 mg | Freq: Four times a day (QID) | INTRAMUSCULAR | Status: DC | PRN

## 2021-10-25 MED ORDER — SODIUM CHLORIDE 0.9% FLUSH: 3.0000 mL | INTRAVENOUS | Status: DC | PRN

## 2021-10-25 MED ORDER — HYDROXYZINE HCL 25 MG PO TABS: 25.0000 mg | ORAL_TABLET | Freq: Three times a day (TID) | ORAL | Status: DC | PRN

## 2021-10-25 MED ORDER — SODIUM CHLORIDE 0.9% FLUSH
3.0000 mL | Freq: Two times a day (BID) | INTRAVENOUS | Status: DC
  Administered 2021-10-25 – 2021-10-28 (×5): 3 mL via INTRAVENOUS

## 2021-10-25 MED ORDER — ALBUTEROL SULFATE (2.5 MG/3ML) 0.083% IN NEBU
INHALATION_SOLUTION | RESPIRATORY_TRACT | Status: AC
  Filled 2021-10-25: qty 3

## 2021-10-25 MED ORDER — SODIUM CHLORIDE 0.9 % IV SOLN: INTRAVENOUS | Status: DC

## 2021-10-25 MED ORDER — LEVOTHYROXINE SODIUM 75 MCG PO TABS
175.0000 ug | ORAL_TABLET | Freq: Every day | ORAL | Status: DC
  Administered 2021-10-25 – 2021-10-28 (×4): 175 ug via ORAL
  Filled 2021-10-25 (×4): qty 1

## 2021-10-25 MED ORDER — ACETAMINOPHEN 325 MG PO TABS
650.0000 mg | ORAL_TABLET | Freq: Four times a day (QID) | ORAL | Status: DC | PRN
  Administered 2021-10-26: 650 mg via ORAL
  Filled 2021-10-25: qty 2

## 2021-10-25 MED ORDER — DM-GUAIFENESIN ER 30-600 MG PO TB12
1.0000 | ORAL_TABLET | Freq: Two times a day (BID) | ORAL | Status: DC
  Administered 2021-10-25 – 2021-10-28 (×6): 1 via ORAL
  Filled 2021-10-25 (×6): qty 1

## 2021-10-25 MED ORDER — MAGNESIUM SULFATE 2 GM/50ML IV SOLN
2.0000 g | Freq: Once | INTRAVENOUS | Status: AC
  Administered 2021-10-25: 2 g via INTRAVENOUS
  Filled 2021-10-25: qty 50

## 2021-10-25 MED ORDER — CITALOPRAM HYDROBROMIDE 20 MG PO TABS
40.0000 mg | ORAL_TABLET | Freq: Every day | ORAL | Status: DC
  Administered 2021-10-25 – 2021-10-28 (×4): 40 mg via ORAL
  Filled 2021-10-25 (×4): qty 2

## 2021-10-25 MED ORDER — HYDRALAZINE HCL 20 MG/ML IJ SOLN
10.0000 mg | Freq: Four times a day (QID) | INTRAMUSCULAR | Status: DC | PRN
  Filled 2021-10-25: qty 0.5

## 2021-10-25 MED ORDER — ACETAMINOPHEN 650 MG RE SUPP: 650.0000 mg | Freq: Four times a day (QID) | RECTAL | Status: DC | PRN

## 2021-10-25 MED ORDER — INSULIN ASPART 100 UNIT/ML IJ SOLN
0.0000 [IU] | Freq: Every day | INTRAMUSCULAR | Status: DC
  Administered 2021-10-25: 4 [IU] via SUBCUTANEOUS
  Administered 2021-10-26 – 2021-10-27 (×2): 2 [IU] via SUBCUTANEOUS

## 2021-10-25 MED ORDER — BISACODYL 10 MG RE SUPP: 10.0000 mg | Freq: Every day | RECTAL | Status: DC | PRN

## 2021-10-25 MED ORDER — INSULIN ASPART 100 UNIT/ML IJ SOLN
0.0000 [IU] | Freq: Three times a day (TID) | INTRAMUSCULAR | Status: DC
  Administered 2021-10-25: 11 [IU] via SUBCUTANEOUS
  Administered 2021-10-26 (×2): 7 [IU] via SUBCUTANEOUS
  Administered 2021-10-27: 3 [IU] via SUBCUTANEOUS
  Administered 2021-10-27: 11 [IU] via SUBCUTANEOUS
  Administered 2021-10-27: 7 [IU] via SUBCUTANEOUS
  Administered 2021-10-28: 11 [IU] via SUBCUTANEOUS

## 2021-10-25 MED ORDER — ONDANSETRON HCL 4 MG PO TABS: 4.0000 mg | ORAL_TABLET | Freq: Four times a day (QID) | ORAL | Status: DC | PRN

## 2021-10-25 MED ORDER — LACTATED RINGERS IV BOLUS
1000.0000 mL | Freq: Once | INTRAVENOUS | Status: AC
  Administered 2021-10-25: 1000 mL via INTRAVENOUS

## 2021-10-25 MED ORDER — TRAZODONE HCL 50 MG PO TABS
100.0000 mg | ORAL_TABLET | Freq: Every day | ORAL | Status: DC
  Administered 2021-10-25 – 2021-10-27 (×3): 100 mg via ORAL
  Filled 2021-10-25 (×3): qty 2

## 2021-10-25 NOTE — H&P (Signed)
Patient Demographics:    Sarah Pope, is a 66 y.o. female  MRN: [id number]   DOB - 06-Oct-1956  Admit Date - 10/25/2021  Outpatient Primary MD for the patient is [place] Nation, MD   Assessment & Plan:    Principal Problem:   Acute respiratory failure with hypoxia [facility]) Active Problems:   Asthma, chronic, unspecified asthma severity, with acute exacerbation   DM type 2 (diabetes mellitus, type 2) ([place])   OBESITY   HTN (hypertension)   OSA (obstructive sleep apnea)   Depression with anxiety    1)Acute Asthma Exacerbation- no definite pneumonia,  treat empirically with IV Solu-Medrol 40 mg every 12 hours,   and bronchodilators as ordered, supplemental oxygen as ordered.   2)Acute Hypoxic Resp Failure-- c/n supplemental oxygen  -Secondary to #1 above -CTA chest without acute findings -BNP 77, Troponin 8  -COVID and influenza negative  3)HTN---Hold Losartan and HCTZ due to concerns for dehydration noncontrasted study -Start amlodipine  may use IV Hydralazine 10 mg  Every 4 hours Prn for systolic blood pressure over 170 mmhg  4)DM2--- previous A1c 10.2,  repeat A1c pending - patient previously had uncontrolled diabetes with hyperglycemia -Hold metformin due to contrast study increase Lantus insulin to 100 units nightly due to steroid-induced hyperglycemia Use Novolog/Humalog Sliding scale insulin with Accu-Cheks/Fingersticks as ordered   5)Obesity/OSA-- Cpap qhs Morbid Obesity- -Low calorie diet, portion control and increase physical activity discussed with patient -Body mass index is 33.75 kg/m.  6)HLD/Atherosclerosis --- c/n Lipitor and aspirin  7)Depression/Anxiety---c/n Celexa daily and trazodone nightly, hydroxyzine as needed anxiety  8)Hypothyroidism--- continue levothyroxine 175 mcg  daily  9) leukocytosis--- WBC 13.0  --suspect this is reactive in the setting of respiratory distress -Check UA, blood cultures pending  -Prophylaxis- -Protonix for GI prophylaxis while on high-dose steroids, heparin for DVT prophylaxis  Disposition/Need for in-Hospital Stay- patient unable to be discharged at this time due to -acute hypoxic respiratory failure secondary to exacerbation requiring IV steroids and supplemental oxygen--- Nilda Riggs discharge home in 1 to 2 days if respiratory status improves with above measures  Dispo: The patient is from: Home              Anticipated d/c is to: Home              Anticipated d/c date is: 1 day              Patient currently is not medically stable to d/c. Barriers: Not Clinically Stable-   With History of - Reviewed by me  Past Medical History:  Diagnosis Date   Acute respiratory failure with hypercapnia (HCC)    Allergy    Anxiety    Asthma    Bronchitis with bronchospasm    Cancer ([place])    skin   Depression    DM type 2 (diabetes mellitus, type 2) ([place])    FATTY LIVER DISEASE 08/05/2008   GERD (gastroesophageal reflux disease)  Hyperlipidemia    Hypertension    Hypothyroidism    Kidney stones    Leukocytosis    Obesity, Class III, BMI 40-49.9 (morbid obesity) ([place])    PERSONAL HX COLONIC POLYPS 09/01/2008   Seasonal allergies    TRANSAMINASES, SERUM, ELEVATED 08/05/2008      Past Surgical History:  Procedure Laterality Date   COLONOSCOPY  2013   Repeat 10 years   [place]   realignment   NOSE SURGERY  2007   sinus infections   POLYPECTOMY     skin cancer removal     TONSILLECTOMY       Chief Complaint  Patient presents with   Shortness of Breath   Cough   Emesis      HPI:    Sarah Pope  is a 66 y.o. female non-smoker with past medical history relevant for hypertension, diabetes, hypothyroidism, obesity and OSA, as well as a history of mild intermittent asthma presents to the ED with  worsening cough, shortness of breath and wheezing for the last couple days -Patient's husband is currently admitted to this hospital with respiratory infection, she has been feeling unwell for the last 3 weeks but over the last couple days respiratory symptoms worsened - In the ED she was found to be very tachycardic, tachypneic and hypoxic requiring up to 5 L of oxygen via nasal cannula -WBC was 13.0, CTA chest without acute cardiopulmonary findings -EKG with sinus tachycardia without acute findings -Creatinine was 0.6, -Troponin 8, BNP 77, -COVID and influenza test negative -Patient endorses fatigue and malaise no high fevers, -Cough might be mostly dry and nonproductive -No leg swelling or pleuritic symptoms -Patient had some posttussive emesis but no diarrhea -Patient denies Frank chest pains, does endorse chest wall and upper abdominal area discomfort with coughing spells   Review of systems:    In addition to the HPI above,   A full Review of  Systems was done, all other systems reviewed are negative except as noted above in HPI , .    Social History:  Reviewed by me    Social History   Tobacco Use   Smoking status: Former    Packs/day: 0.50    Years: 5.00    Pack years: 2.50    Types: Cigarettes    Quit date: 10/23/1982    Years since quitting: 39.0   Smokeless tobacco: Never  Substance Use Topics   Alcohol use: No     Family History :  Reviewed by me    Family History  Problem Relation Age of Onset   Cancer Mother    Hypertension Mother    Thyroid disease Mother    Breast cancer Mother    Diabetes Mother    Prostate cancer Father    Heart attack Maternal Grandmother    Allergies Daughter    Colon cancer Neg Hx    Esophageal cancer Neg Hx    Rectal cancer Neg Hx    Stomach cancer Neg Hx    Colon polyps Neg Hx     Home Medications:   Prior to Admission medications   Medication Sig Start Date End Date Taking? Authorizing Provider  albuterol  (VENTOLIN HFA) 108 (90 Base) MCG/ACT inhaler INHALE TWO PUFFS INTO THE LUNGS EVERY 6 HOURS AS NEEDED FOR WHEEZING OR SHORTNESS OF BREATH Patient taking differently: Inhale 2 puffs into the lungs every 6 (six) hours as needed for shortness of breath. 05/18/21  Yes Lovena Le, Malena M, DO  aspirin  EC 81 MG tablet Take 81 mg by mouth daily.   Yes [provider]  atorvastatin (LIPITOR) 20 MG tablet Take 1 tablet (20 mg total) by mouth daily. 04/18/21  Yes Lovena Le, Malena M, DO  cetirizine (ZYRTEC) 10 MG tablet Take 10 mg by mouth daily.   Yes [provider]  ciclopirox (PENLAC) 8 % solution Apply topically at bedtime. Apply over nail and surrounding skin. Apply daily over previous coat. After seven (7) days, may remove with alcohol and continue cycle. 02/22/21  Yes Trula Slade, DPM  citalopram (CELEXA) 40 MG tablet Take 1 tablet (40 mg total) by mouth daily. 04/18/21  Yes Lovena Le, Malena M, DO  Continuous Blood Gluc Receiver (FREESTYLE LIBRE 14 DAY READER) DEVI USE AS DIRECTED THREE TIMES DAILY. 09/15/20  Yes Taylor, Malena M, DO  Continuous Blood Gluc Sensor (FREESTYLE LIBRE 14 DAY SENSOR) MISC USE AS DIRECTED THREE TIMES DAILY WITH SLIDING SCALE INSULIN USE. 09/15/20  Yes Lovena Le, Malena M, DO  diclofenac (VOLTAREN) 75 MG EC tablet TAKE ONE TABLET BY MOUTH TWICE daily PRN pain. TAKE WITH FOOD. 04/18/21  Yes Lovena Le, Malena M, DO  Fluticasone-Salmeterol (ADVAIR DISKUS) 250-50 MCG/DOSE AEPB Inhale 1 puff into the lungs 2 (two) times daily. 08/16/20  Yes Lovena Le, Malena M, DO  hydrochlorothiazide (HYDRODIURIL) 12.5 MG tablet Take 1 tablet (12.5 mg total) by mouth daily. 04/18/21  Yes Lovena Le, Malena M, DO  insulin glargine (LANTUS) 100 unit/mL SOPN Inject 80 Units into the skin daily. 04/18/21  Yes Lovena Le, Malena M, DO  insulin lispro (HUMALOG) 100 UNIT/ML injection Inject 0.12-0.18 mLs (12-18 Units total) into the skin 3 (three) times daily with meals. 08/01/21  Yes Brita Romp, NP   levothyroxine (SYNTHROID) 175 MCG tablet Take 1 tablet (175 mcg total) by mouth daily. 04/18/21  Yes Lovena Le, Malena M, DO  losartan (COZAAR) 50 MG tablet Take 1 tablet (50 mg total) by mouth daily. Needs labs. 04/18/21  Yes Lovena Le, Malena M, DO  metFORMIN (GLUCOPHAGE) 1000 MG tablet Take 1 tablet (1,000 mg total) by mouth 2 (two) times daily with a meal. 04/18/21  Yes Lovena Le, Malena M, DO  omeprazole (PRILOSEC) 40 MG capsule Take 1 capsule (40 mg total) by mouth daily. 04/18/21  Yes Lovena Le, Malena M, DO     Allergies:     Allergies  Allergen Reactions   Codeine Nausea Only   Sulfa Antibiotics     itching     Physical Exam:   Vitals  Blood pressure (!) 151/60, pulse (!) 110, temperature 98.5 F (36.9 C), temperature source Oral, resp. rate (!) 21, height 5\' 5"  (1.651 m), weight 92 kg, SpO2 100 %.  Physical Examination: General appearance - alert, conversational dyspnea Mental status - alert, oriented to person, place, and time,  Eyes - sclera anicteric Nose- [place] 2L/min Neck - supple, no JVD elevation , Chest -diminished breath sounds, scattered wheezes bilaterally, increased work of breathing Heart - S1 and S2 normal, regular , tachycardic Abdomen - soft, nontender, nondistended, no masses or organomegaly Neurological - screening mental status exam normal, neck supple without rigidity, cranial nerves II through XII intact, DTR's normal and symmetric Extremities - no pedal edema noted, intact peripheral pulses  Skin - warm, dry     Data Review:    CBC Recent Labs  Lab 10/25/21 1205  WBC 13.0*  HGB 13.7  HCT 43.1  PLT 250  MCV 83.0  MCH 26.4  MCHC 31.8  RDW 13.4   ------------------------------------------------------------------------------------------------------------------  Chemistries  Recent Labs  Lab 10/25/21 1205  NA 139  K 4.3  CL 99  CO2 31  GLUCOSE 222*  BUN 11  CREATININE 0.60  CALCIUM 9.3    ------------------------------------------------------------------------------------------------------------------ estimated creatinine clearance is 79.6 mL/min (by C-G formula based on SCr of 0.6 mg/dL). ------------------------------------------------------------------------------------------------------------------ No results for input(s): TSH, T4TOTAL, T3FREE, THYROIDAB in the last 72 hours.  Invalid input(s): FREET3   Coagulation profile Recent Labs  Lab 10/25/21 1242  INR 1.0   ------------------------------------------------------------------------------------------------------------------- No results for input(s): DDIMER in the last 72 hours. -------------------------------------------------------------------------------------------------------------------  Cardiac Enzymes No results for input(s): CKMB, TROPONINI, MYOGLOBIN in the last 168 hours.  Invalid input(s): CK ------------------------------------------------------------------------------------------------------------------    Component Value Date/Time   BNP 77.0 10/25/2021 1242    Urinalysis    Component Value Date/Time   COLORURINE YELLOW 12/03/2017 1013   APPEARANCEUR HAZY (A) 12/03/2017 1013   LABSPEC 1.019 12/03/2017 1013   PHURINE 7.0 12/03/2017 1013   GLUCOSEU NEGATIVE 12/03/2017 1013   HGBUR MODERATE (A) 12/03/2017 1013   BILIRUBINUR NEGATIVE 12/03/2017 1013   KETONESUR NEGATIVE 12/03/2017 1013   PROTEINUR NEGATIVE 12/03/2017 1013   UROBILINOGEN 0.2 10/21/2012 0148   NITRITE Negative 08/31/2020 0935   NITRITE NEGATIVE 12/03/2017 1013   LEUKOCYTESUR Moderate (2+) (A) 08/31/2020 0935     Imaging Results:    DG Chest 2 View  Result Date: 10/25/2021 CLINICAL DATA:  chest pain EXAM: CHEST - 2 VIEW COMPARISON:  10/12/2021. FINDINGS: Cardiomediastinal silhouette is within normal limits. No consolidation. No visible pleural effusions or pneumothorax. Mild biapical pleuroparenchymal scarring.  Degenerative changes of the spine. IMPRESSION: No evidence of acute cardiopulmonary disease. Electronically Signed   By: Margaretha Sheffield M.D.   On: 10/25/2021 12:19   CT Angio Chest PE W/Cm &/Or Wo Cm  Result Date: 10/25/2021 CLINICAL DATA:  Cough,  Pulmonary embolism (PE) suspected, high prob EXAM: CT ANGIOGRAPHY CHEST WITH CONTRAST TECHNIQUE: Multidetector CT imaging of the chest was performed using the standard protocol during bolus administration of intravenous contrast. Multiplanar CT image reconstructions and MIPs were obtained to evaluate the vascular anatomy. CONTRAST:  27mL OMNIPAQUE IOHEXOL 350 MG/ML SOLN COMPARISON:  Chest x-ray 10/25/2021 FINDINGS: Cardiovascular: Satisfactory opacification of the pulmonary arteries to the segmental level. No evidence of pulmonary embolism. Normal heart size. No significant pericardial effusion. The thoracic aorta is normal in caliber. Mild atherosclerotic plaque of the thoracic aorta. Coronary artery calcifications. Mediastinum/Nodes: No enlarged mediastinal, hilar, or axillary lymph nodes. Thyroid gland, trachea, and esophagus demonstrate no significant findings. Lungs/Pleura: No focal consolidation. No pulmonary nodule. No pulmonary mass. No pleural effusion. No pneumothorax. Upper Abdomen: No acute abnormality. Musculoskeletal: No chest wall abnormality. No suspicious lytic or blastic osseous lesions. No acute displaced fracture. Multilevel degenerative changes of the spine. Review of the MIP images confirms the above findings. IMPRESSION: 1. No pulmonary embolus. 2. No acute intrathoracic abnormality. 3.  Aortic Atherosclerosis (ICD10-I70.0). Electronically Signed   By: Iven Finn M.D.   On: 10/25/2021 15:04    Radiological Exams on Admission: DG Chest 2 View  Result Date: 10/25/2021 CLINICAL DATA:  chest pain EXAM: CHEST - 2 VIEW COMPARISON:  10/12/2021. FINDINGS: Cardiomediastinal silhouette is within normal limits. No consolidation. No visible  pleural effusions or pneumothorax. Mild biapical pleuroparenchymal scarring. Degenerative changes of the spine. IMPRESSION: No evidence of acute cardiopulmonary disease. Electronically Signed   By: Margaretha Sheffield M.D.   On: 10/25/2021 12:19   CT Angio Chest PE W/Cm &/Or Wo Cm  Result Date: 10/25/2021 CLINICAL DATA:  Cough,  Pulmonary embolism (PE) suspected, high prob EXAM: CT ANGIOGRAPHY CHEST WITH CONTRAST TECHNIQUE: Multidetector CT imaging of the chest was performed using the standard protocol during bolus administration of intravenous contrast. Multiplanar CT image reconstructions and MIPs were obtained to evaluate the vascular anatomy. CONTRAST:  57mL OMNIPAQUE IOHEXOL 350 MG/ML SOLN COMPARISON:  Chest x-ray 10/25/2021 FINDINGS: Cardiovascular: Satisfactory opacification of the pulmonary arteries to the segmental level. No evidence of pulmonary embolism. Normal heart size. No significant pericardial effusion. The thoracic aorta is normal in caliber. Mild atherosclerotic plaque of the thoracic aorta. Coronary artery calcifications. Mediastinum/Nodes: No enlarged mediastinal, hilar, or axillary lymph nodes. Thyroid gland, trachea, and esophagus demonstrate no significant findings. Lungs/Pleura: No focal consolidation. No pulmonary nodule. No pulmonary mass. No pleural effusion. No pneumothorax. Upper Abdomen: No acute abnormality. Musculoskeletal: No chest wall abnormality. No suspicious lytic or blastic osseous lesions. No acute displaced fracture. Multilevel degenerative changes of the spine. Review of the MIP images confirms the above findings. IMPRESSION: 1. No pulmonary embolus. 2. No acute intrathoracic abnormality. 3.  Aortic Atherosclerosis (ICD10-I70.0). Electronically Signed   By: Iven Finn M.D.   On: 10/25/2021 15:04    DVT Prophylaxis -SCD/Heparin AM Labs Ordered, also please review Full Orders  Family Communication: Admission, patients condition and plan of care including tests  being ordered have been discussed with the patient who indicate understanding and agree with the plan   Code Status - Full Code  Likely DC to  home  Condition  -Stable  Roxan Hockey M.D on 10/25/2021 at 5:54 PM Go to [email or web address] -  for contact info  [facility] - Office  [phone]

## 2021-10-25 NOTE — Progress Notes (Signed)
Pt place on CPAP auto titrate with 2lpm bleed in machine plugged in red outlet, pt tolerating well spo2 97% rt will continue to monitor and patient instructed to call for assistant if needed.

## 2021-10-25 NOTE — Progress Notes (Signed)
Pt states she doesn't wear CPAP at home and hasn't for a long time but is willing to give it a try tonight

## 2021-10-25 NOTE — ED Notes (Signed)
Pt placed on cardiac monitor with BP to set cycle every 30 minutes. Continuous pulse oximeter applied. Pt on O2 at 2 lnc sats 89%, O2 increased to 4lnc.

## 2021-10-25 NOTE — ED Notes (Signed)
Second set of blood cultures obtained at 1250

## 2021-10-25 NOTE — ED Notes (Signed)
Respiratory called for duoneb  

## 2021-10-25 NOTE — ED Provider Notes (Signed)
Sarah Pope   CSN: [id number] Arrival date & time: 10/25/21  1059     History  No chief complaint on file.   Sarah Pope is a 66 y.o. female.  She is here with a complaint of cough productive of some green sputum, shortness of breath that has been going on for about a month.  She has been on antibiotics and steroids and using her inhaler.  She has had some nausea and vomiting.  Was seen here about 2 weeks ago for same.  Also history of valvular disease and is following up with cardiology regarding that.  Denies any chest pain.  No syncope.  He has received COVID and flu vaccinations.  The history is provided by the patient and a relative.  Shortness of Breath Severity:  Severe Onset quality:  Gradual Duration:  1 month Timing:  Intermittent Progression:  Worsening Chronicity:  New Relieved by:  Nothing Worsened by:  Activity, coughing and deep breathing Ineffective treatments:  Inhaler and rest Associated symptoms: cough, sputum production, vomiting and wheezing   Associated symptoms: no abdominal pain, no chest pain, no fever, no rash and no sore throat   Risk factors: no tobacco use   Cough Associated symptoms: shortness of breath and wheezing   Associated symptoms: no chest pain, no fever, no myalgias, no rash and no sore throat   Emesis Associated symptoms: cough   Associated symptoms: no abdominal pain, no fever, no myalgias and no sore throat       Home Medications Prior to Admission medications   Medication Sig Start Date End Date Taking? Authorizing Provider  albuterol (VENTOLIN HFA) 108 (90 Base) MCG/ACT inhaler INHALE TWO PUFFS INTO THE LUNGS EVERY 6 HOURS AS NEEDED FOR WHEEZING OR SHORTNESS OF BREATH Patient taking differently: Inhale 2 puffs into the lungs every 6 (six) hours as needed for shortness of breath. 05/18/21   Elvia Collum M, DO  aspirin EC 81 MG tablet Take 81 mg by mouth daily.    [provider]   atorvastatin (LIPITOR) 20 MG tablet Take 1 tablet (20 mg total) by mouth daily. 04/18/21   Elvia Collum M, DO  cetirizine (ZYRTEC) 10 MG tablet Take 10 mg by mouth daily.    [provider]  ciclopirox (PENLAC) 8 % solution Apply topically at bedtime. Apply over nail and surrounding skin. Apply daily over previous coat. After seven (7) days, may remove with alcohol and continue cycle. 02/22/21   Trula Slade, DPM  citalopram (CELEXA) 40 MG tablet Take 1 tablet (40 mg total) by mouth daily. 04/18/21   Elvia Collum M, DO  Continuous Blood Gluc Receiver (FREESTYLE LIBRE 14 DAY READER) DEVI USE AS DIRECTED THREE TIMES DAILY. 09/15/20   Elvia Collum M, DO  Continuous Blood Gluc Sensor (FREESTYLE LIBRE 14 DAY SENSOR) MISC USE AS DIRECTED THREE TIMES DAILY WITH SLIDING SCALE INSULIN USE. 09/15/20   Lovena Le, Malena M, DO  diclofenac (VOLTAREN) 75 MG EC tablet TAKE ONE TABLET BY MOUTH TWICE daily PRN pain. TAKE WITH FOOD. 04/18/21   Elvia Collum M, DO  Fluticasone-Salmeterol (ADVAIR DISKUS) 250-50 MCG/DOSE AEPB Inhale 1 puff into the lungs 2 (two) times daily. 08/16/20   Lovena Le, Malena M, DO  hydrochlorothiazide (HYDRODIURIL) 12.5 MG tablet Take 1 tablet (12.5 mg total) by mouth daily. 04/18/21   Lovena Le, Malena M, DO  insulin glargine (LANTUS) 100 unit/mL SOPN Inject 80 Units into the skin daily. 04/18/21   Erven Colla, DO  insulin lispro (HUMALOG) 100 UNIT/ML injection Inject 0.12-0.18 mLs (12-18 Units total) into the skin 3 (three) times daily with meals. 08/01/21   Brita Romp, NP  levothyroxine (SYNTHROID) 175 MCG tablet Take 1 tablet (175 mcg total) by mouth daily. 04/18/21   Lovena Le, Malena M, DO  losartan (COZAAR) 50 MG tablet Take 1 tablet (50 mg total) by mouth daily. Needs labs. 04/18/21   Elvia Collum M, DO  metFORMIN (GLUCOPHAGE) 1000 MG tablet Take 1 tablet (1,000 mg total) by mouth 2 (two) times daily with a meal. 04/18/21   Lovena Le, Malena M, DO  omeprazole (PRILOSEC) 40  MG capsule Take 1 capsule (40 mg total) by mouth daily. 04/18/21   Erven Colla, DO      Allergies    Codeine and Sulfa antibiotics    Review of Systems   Review of Systems  Constitutional:  Negative for fever.  HENT:  Negative for sore throat.   Eyes:  Negative for visual disturbance.  Respiratory:  Positive for cough, sputum production, shortness of breath and wheezing.   Cardiovascular:  Negative for chest pain.  Gastrointestinal:  Positive for vomiting. Negative for abdominal pain.  Genitourinary:  Negative for dysuria.  Musculoskeletal:  Negative for myalgias.  Skin:  Negative for rash.  Neurological:  Negative for syncope.   Physical Exam Updated Vital Signs BP (!) 197/139    Pulse (!) 116    Temp 98.4 F (36.9 C) (Oral)    Resp (!) 24    Ht 5\' 5"  (1.651 m)    Wt 92 kg    SpO2 100%    BMI 33.75 kg/m  Physical Exam Vitals and nursing Pope reviewed.  Constitutional:      General: She is in acute distress.     Appearance: Normal appearance. She is well-developed.  HENT:     Head: Normocephalic and atraumatic.  Eyes:     Conjunctiva/sclera: Conjunctivae normal.  Cardiovascular:     Rate and Rhythm: Regular rhythm. Tachycardia present.     Pulses: Normal pulses.     Heart sounds: No murmur heard. Pulmonary:     Effort: Tachypnea and accessory muscle usage present. No respiratory distress.     Breath sounds: Wheezing present.  Abdominal:     Palpations: Abdomen is soft.     Tenderness: There is no abdominal tenderness. There is no guarding or rebound.  Musculoskeletal:        General: No swelling.     Cervical back: Neck supple.     Right lower leg: No edema.     Left lower leg: No edema.  Skin:    General: Skin is warm and dry.     Capillary Refill: Capillary refill takes less than 2 seconds.     Findings: No lesion.  Neurological:     General: No focal deficit present.     Mental Status: She is alert.  Psychiatric:        Mood and Affect: Mood normal.     ED Results / Procedures / Treatments   Labs (all labs ordered are listed, but only abnormal results are displayed) Labs Reviewed  BASIC METABOLIC PANEL - Abnormal; Notable for the following components:      Result Value   Glucose, Bld 222 (*)    All other components within normal limits  CBC - Abnormal; Notable for the following components:   WBC 13.0 (*)    RBC 5.19 (*)    All other components within normal limits  GLUCOSE, CAPILLARY - Abnormal; Notable for the following components:   Glucose-Capillary 290 (*)    All other components within normal limits  RESP PANEL BY RT-PCR (FLU A&B, COVID) ARPGX2  CULTURE, BLOOD (ROUTINE X 2)  CULTURE, BLOOD (ROUTINE X 2)  URINE CULTURE  LACTIC ACID, PLASMA  PROTIME-INR  APTT  BRAIN NATRIURETIC PEPTIDE  URINALYSIS, ROUTINE W REFLEX MICROSCOPIC  HIV ANTIBODY (ROUTINE TESTING W REFLEX)  HEMOGLOBIN B3Z  BASIC METABOLIC PANEL  CBC  TROPONIN I (HIGH SENSITIVITY)  TROPONIN I (HIGH SENSITIVITY)    EKG EKG Interpretation  Date/Time:  Tuesday October 25 2021 11:58:44 EST Ventricular Rate:  113 PR Interval:  152 QRS Duration: 74 QT Interval:  336 QTC Calculation: 460 R Axis:   33 Text Interpretation: Sinus tachycardia Septal infarct , age undetermined Abnormal ECG When compared with ECG of 20-Oct-2012 20:49, Septal infarct is now Present Non-specific change in ST segment in Inferior leads Nonspecific T wave abnormality no longer evident in Inferior leads Confirmed by Aletta Edouard [phone]) on 10/25/2021 12:04:04 PM  Radiology DG Chest 2 View  Result Date: 10/25/2021 CLINICAL DATA:  chest pain EXAM: CHEST - 2 VIEW COMPARISON:  10/12/2021. FINDINGS: Cardiomediastinal silhouette is within normal limits. No consolidation. No visible pleural effusions or pneumothorax. Mild biapical pleuroparenchymal scarring. Degenerative changes of the spine. IMPRESSION: No evidence of acute cardiopulmonary disease. Electronically Signed   By: Margaretha Sheffield M.D.   On: 10/25/2021 12:19   CT Angio Chest PE W/Cm &/Or Wo Cm  Result Date: 10/25/2021 CLINICAL DATA:  Cough,  Pulmonary embolism (PE) suspected, high prob EXAM: CT ANGIOGRAPHY CHEST WITH CONTRAST TECHNIQUE: Multidetector CT imaging of the chest was performed using the standard protocol during bolus administration of intravenous contrast. Multiplanar CT image reconstructions and MIPs were obtained to evaluate the vascular anatomy. CONTRAST:  51mL OMNIPAQUE IOHEXOL 350 MG/ML SOLN COMPARISON:  Chest x-ray 10/25/2021 FINDINGS: Cardiovascular: Satisfactory opacification of the pulmonary arteries to the segmental level. No evidence of pulmonary embolism. Normal heart size. No significant pericardial effusion. The thoracic aorta is normal in caliber. Mild atherosclerotic plaque of the thoracic aorta. Coronary artery calcifications. Mediastinum/Nodes: No enlarged mediastinal, hilar, or axillary lymph nodes. Thyroid gland, trachea, and esophagus demonstrate no significant findings. Lungs/Pleura: No focal consolidation. No pulmonary nodule. No pulmonary mass. No pleural effusion. No pneumothorax. Upper Abdomen: No acute abnormality. Musculoskeletal: No chest wall abnormality. No suspicious lytic or blastic osseous lesions. No acute displaced fracture. Multilevel degenerative changes of the spine. Review of the MIP images confirms the above findings. IMPRESSION: 1. No pulmonary embolus. 2. No acute intrathoracic abnormality. 3.  Aortic Atherosclerosis (ICD10-I70.0). Electronically Signed   By: Iven Finn M.D.   On: 10/25/2021 15:04    Procedures .Critical Care Performed by: Hayden Rasmussen, MD Authorized by: Hayden Rasmussen, MD   Critical care provider statement:    Critical care time (minutes):  45   Critical care time was exclusive of:  Separately billable procedures and treating other patients   Critical care was necessary to treat or prevent imminent or life-threatening deterioration of the  following conditions:  Respiratory failure   Critical care was time spent personally by me on the following activities:  Development of treatment plan with patient or surrogate, discussions with consultants, evaluation of patient's response to treatment, examination of patient, obtaining history from patient or surrogate, ordering and performing treatments and interventions, ordering and review of laboratory studies, ordering and review of radiographic studies, pulse oximetry, re-evaluation of patient's condition and  review of old charts   I assumed direction of critical care for this patient from another provider in my specialty: no      Medications Ordered in ED Medications  aspirin EC tablet 81 mg (has no administration in time range)  atorvastatin (LIPITOR) tablet 20 mg (20 mg Oral Given 10/25/21 1824)  citalopram (CELEXA) tablet 40 mg (40 mg Oral Given 10/25/21 1824)  insulin glargine-yfgn (SEMGLEE) injection 100 Units (has no administration in time range)  levothyroxine (SYNTHROID) tablet 175 mcg (175 mcg Oral Given 10/25/21 1824)  pantoprazole (PROTONIX) EC tablet 40 mg (40 mg Oral Given 10/25/21 1824)  loratadine (CLARITIN) tablet 10 mg (10 mg Oral Given 10/25/21 1825)  mometasone-formoterol (DULERA) 200-5 MCG/ACT inhaler 2 puff (has no administration in time range)  sodium chloride flush (NS) 0.9 % injection 3 mL (has no administration in time range)  sodium chloride flush (NS) 0.9 % injection 3 mL (has no administration in time range)  sodium chloride flush (NS) 0.9 % injection 3 mL (has no administration in time range)  0.9 %  sodium chloride infusion (has no administration in time range)  acetaminophen (TYLENOL) tablet 650 mg (has no administration in time range)    Or  acetaminophen (TYLENOL) suppository 650 mg (has no administration in time range)  polyethylene glycol (MIRALAX / GLYCOLAX) packet 17 g (has no administration in time range)  bisacodyl (DULCOLAX) suppository 10 mg (has no  administration in time range)  ondansetron (ZOFRAN) tablet 4 mg (has no administration in time range)    Or  ondansetron (ZOFRAN) injection 4 mg (has no administration in time range)  heparin injection 5,000 Units (5,000 Units Subcutaneous Given 10/25/21 1827)  insulin aspart (novoLOG) injection 0-20 Units (11 Units Subcutaneous Given 10/25/21 1825)  insulin aspart (novoLOG) injection 0-5 Units (has no administration in time range)  insulin aspart (novoLOG) injection 4 Units (4 Units Subcutaneous Given 10/25/21 1826)  methylPREDNISolone sodium succinate (SOLU-MEDROL) 40 mg/mL injection 40 mg (40 mg Intravenous Given 10/25/21 1824)  0.9 %  sodium chloride infusion ( Intravenous New Bag/Given 10/25/21 1825)  ipratropium-albuterol (DUONEB) 0.5-2.5 (3) MG/3ML nebulizer solution 3 mL (3 mLs Nebulization Not Given 10/25/21 1714)  albuterol (PROVENTIL) (2.5 MG/3ML) 0.083% nebulizer solution 2.5 mg (has no administration in time range)  hydrALAZINE (APRESOLINE) injection 10 mg (has no administration in time range)  dextromethorphan-guaiFENesin (MUCINEX DM) 30-600 MG per 12 hr tablet 1 tablet (has no administration in time range)  traZODone (DESYREL) tablet 100 mg (has no administration in time range)  hydrOXYzine (ATARAX) tablet 25 mg (has no administration in time range)  lactated ringers bolus 1,000 mL (1,000 mLs Intravenous Bolus 10/25/21 1310)  ipratropium-albuterol (DUONEB) 0.5-2.5 (3) MG/3ML nebulizer solution 3 mL (3 mLs Nebulization Given 10/25/21 1350)  magnesium sulfate IVPB 2 g 50 mL (0 g Intravenous Stopped 10/25/21 1415)  methylPREDNISolone sodium succinate (SOLU-MEDROL) 125 mg/2 mL injection 125 mg (125 mg Intravenous Given 10/25/21 1311)  albuterol (PROVENTIL) (2.5 MG/3ML) 0.083% nebulizer solution 2.5 mg (2.5 mg Nebulization Given 10/25/21 1357)  iohexol (OMNIPAQUE) 350 MG/ML injection 100 mL (80 mLs Intravenous Contrast Given 10/25/21 1447)  chlorpheniramine-HYDROcodone (TUSSIONEX) 10-8 MG/5ML suspension 10  mL (10 mLs Oral Given 10/25/21 1833)    ED Course/ Medical Decision Making/ A&P Clinical Course as of 10/25/21 1847  Tue Oct 25, 2021  1402 Assessment-still has audible wheezing and requiring oxygen.  Will need admission to the hospital for further management. [MB]  3570 Discussed with Dr. Denton Brick.  He asks if we can  put the patient in for a CT angio chest to exclude PE. [MB]    Clinical Course User Index [MB] Hayden Rasmussen, MD                           Medical Decision Making  Sarah Pope was evaluated in Emergency Department on 10/25/2021 for the symptoms described in the history of present illness. She was evaluated in the context of the global COVID-19 pandemic, which necessitated consideration that the patient might be at risk for infection with the SARS-CoV-2 virus that causes COVID-19. Institutional protocols and algorithms that pertain to the evaluation of patients at risk for COVID-19 are in a state of rapid change based on information released by regulatory bodies including the CDC and federal and state organizations. These policies and algorithms were followed during the patient's care in the ED.  This patient presents to the ED for concern of cough and shortness of breath, this involves an extensive number of treatment options, and is a complaint that carries with it a high risk of complications and morbidity.  The differential diagnosis includes CHF, COPD, asthma, pneumonia, pneumothorax, vascular, PE, metabolic derangement   Additional history obtained:  Additional history obtained from patient's family member External records from outside source obtained and reviewed including in epic including recent ED visit and cardiology visit   Lab Tests:  I Ordered, reviewed, and interpreted labs.  The pertinent results include: CBC with elevated white count normal hemoglobin, chemistries normal other than elevated glucose, troponin flat, BNP not elevated, blood culture sent,  lactic acid not elevated, COVID and flu negative   Imaging Studies ordered:  I ordered imaging studies including chest x-ray and CT angio chest I independently visualized and interpreted imaging which showed no acute findings I agree with the radiologist interpretation   Cardiac Monitoring:  The patient was maintained on a cardiac monitor.  I personally viewed and interpreted the cardiac monitored which showed an underlying rhythm of: Sinus tachycardia   Medicines ordered and prescription drug management:  I ordered medication including IV fluids IV steroids and magnesium, inhalational treatments for shortness of breath Reevaluation of the patient after these medicines showed that the patient improved I have reviewed the patients home medicines and have made adjustments as needed   Critical Interventions:  Work-up and management of patient's tachycardia and hypoxia   Consultations Obtained:  I requested consultation with the [facility] hospitalist Dr. Denton Brick I,  and discussed lab and imaging findings as well as pertinent plan - they recommend: CT angio chest   Problem List / ED Course:     Reevaluation:  After the interventions noted above, I reevaluated the patient and found that they have :improved   Dispostion:  After consideration of the diagnostic results and the patients response to treatment feel that the patent would benefit from admission to the hospital for further management of her symptoms.  Patient is requiring oxygen.  Patient agreement with plan..          Final Clinical Impression(s) / ED Diagnoses Final diagnoses:  Acute respiratory failure with hypoxia ([place])  Asthma with acute exacerbation, unspecified asthma severity, unspecified whether persistent    Rx / DC Orders ED Discharge Orders     None         Hayden Rasmussen, MD 10/25/21 [phone]

## 2021-10-26 ENCOUNTER — Encounter (HOSPITAL_COMMUNITY): Payer: Self-pay | Admitting: Family Medicine

## 2021-10-26 DIAGNOSIS — Z7984 Long term (current) use of oral hypoglycemic drugs: Secondary | ICD-10-CM | POA: Diagnosis not present

## 2021-10-26 DIAGNOSIS — E785 Hyperlipidemia, unspecified: Secondary | ICD-10-CM | POA: Diagnosis present

## 2021-10-26 DIAGNOSIS — Z79899 Other long term (current) drug therapy: Secondary | ICD-10-CM | POA: Diagnosis not present

## 2021-10-26 DIAGNOSIS — E11649 Type 2 diabetes mellitus with hypoglycemia without coma: Secondary | ICD-10-CM | POA: Diagnosis not present

## 2021-10-26 DIAGNOSIS — E1165 Type 2 diabetes mellitus with hyperglycemia: Secondary | ICD-10-CM | POA: Diagnosis present

## 2021-10-26 DIAGNOSIS — E039 Hypothyroidism, unspecified: Secondary | ICD-10-CM | POA: Diagnosis present

## 2021-10-26 DIAGNOSIS — J9600 Acute respiratory failure, unspecified whether with hypoxia or hypercapnia: Secondary | ICD-10-CM | POA: Diagnosis present

## 2021-10-26 DIAGNOSIS — Z6833 Body mass index (BMI) 33.0-33.9, adult: Secondary | ICD-10-CM | POA: Diagnosis not present

## 2021-10-26 DIAGNOSIS — Z794 Long term (current) use of insulin: Secondary | ICD-10-CM | POA: Diagnosis not present

## 2021-10-26 DIAGNOSIS — Z7982 Long term (current) use of aspirin: Secondary | ICD-10-CM | POA: Diagnosis not present

## 2021-10-26 DIAGNOSIS — J4521 Mild intermittent asthma with (acute) exacerbation: Secondary | ICD-10-CM | POA: Diagnosis present

## 2021-10-26 DIAGNOSIS — J45901 Unspecified asthma with (acute) exacerbation: Secondary | ICD-10-CM

## 2021-10-26 DIAGNOSIS — Z85828 Personal history of other malignant neoplasm of skin: Secondary | ICD-10-CM | POA: Diagnosis not present

## 2021-10-26 DIAGNOSIS — T380X5A Adverse effect of glucocorticoids and synthetic analogues, initial encounter: Secondary | ICD-10-CM | POA: Diagnosis not present

## 2021-10-26 DIAGNOSIS — F32A Depression, unspecified: Secondary | ICD-10-CM | POA: Diagnosis present

## 2021-10-26 DIAGNOSIS — Z885 Allergy status to narcotic agent status: Secondary | ICD-10-CM | POA: Diagnosis not present

## 2021-10-26 DIAGNOSIS — I10 Essential (primary) hypertension: Secondary | ICD-10-CM | POA: Diagnosis present

## 2021-10-26 DIAGNOSIS — Z882 Allergy status to sulfonamides status: Secondary | ICD-10-CM | POA: Diagnosis not present

## 2021-10-26 DIAGNOSIS — Z20822 Contact with and (suspected) exposure to covid-19: Secondary | ICD-10-CM | POA: Diagnosis present

## 2021-10-26 DIAGNOSIS — Z79890 Hormone replacement therapy (postmenopausal): Secondary | ICD-10-CM | POA: Diagnosis not present

## 2021-10-26 DIAGNOSIS — Z7951 Long term (current) use of inhaled steroids: Secondary | ICD-10-CM | POA: Diagnosis not present

## 2021-10-26 DIAGNOSIS — J9601 Acute respiratory failure with hypoxia: Secondary | ICD-10-CM | POA: Diagnosis present

## 2021-10-26 DIAGNOSIS — F419 Anxiety disorder, unspecified: Secondary | ICD-10-CM | POA: Diagnosis present

## 2021-10-26 DIAGNOSIS — G4733 Obstructive sleep apnea (adult) (pediatric): Secondary | ICD-10-CM | POA: Diagnosis present

## 2021-10-26 DIAGNOSIS — Z87891 Personal history of nicotine dependence: Secondary | ICD-10-CM | POA: Diagnosis not present

## 2021-10-26 LAB — BASIC METABOLIC PANEL
Anion gap: 6 (ref 5–15)
BUN: 13 mg/dL (ref 8–23)
CO2: 28 mmol/L (ref 22–32)
Calcium: 8.4 mg/dL — ABNORMAL LOW (ref 8.9–10.3)
Chloride: 103 mmol/L (ref 98–111)
Creatinine, Ser: 0.56 mg/dL (ref 0.44–1.00)
GFR, Estimated: >60 mL/min (ref >60)
Glucose, Bld: 227 mg/dL — ABNORMAL HIGH (ref 70–99)
Potassium: 4.4 mmol/L (ref 3.5–5.1)
Sodium: 137 mmol/L (ref 135–145)

## 2021-10-26 LAB — CBC
HCT: 35.9 % — ABNORMAL LOW (ref 36.0–46.0)
Hemoglobin: 11.3 g/dL — ABNORMAL LOW (ref 12.0–15.0)
MCH: 25.9 pg — ABNORMAL LOW (ref 26.0–34.0)
MCHC: 31.5 g/dL (ref 30.0–36.0)
MCV: 82.2 fL (ref 80.0–100.0)
Platelets: 204 10*3/uL (ref 150–400)
RBC: 4.37 MIL/uL (ref 3.87–5.11)
RDW: 13.2 % (ref 11.5–15.5)
WBC: 12.4 10*3/uL — ABNORMAL HIGH (ref 4.0–10.5)
nRBC: 0 % (ref 0.0–0.2)

## 2021-10-26 LAB — GLUCOSE, CAPILLARY
Glucose-Capillary: 208 mg/dL — ABNORMAL HIGH (ref 70–99)
Glucose-Capillary: 217 mg/dL — ABNORMAL HIGH (ref 70–99)
Glucose-Capillary: 59 mg/dL — ABNORMAL LOW (ref 70–99)
Glucose-Capillary: 61 mg/dL — ABNORMAL LOW (ref 70–99)
Glucose-Capillary: 90 mg/dL (ref 70–99)
Glucose-Capillary: 215 mg/dL — ABNORMAL HIGH (ref 70–99)

## 2021-10-26 MED ORDER — INSULIN GLARGINE-YFGN 100 UNIT/ML ~~LOC~~ SOLN
80.0000 [IU] | Freq: Every day | SUBCUTANEOUS | Status: DC
  Administered 2021-10-27 – 2021-10-28 (×2): 80 [IU] via SUBCUTANEOUS
  Filled 2021-10-26 (×5): qty 0.8

## 2021-10-26 NOTE — Progress Notes (Signed)
°  Transition of Care [facility]) Screening Note   Patient Details  Name: LIONA WENGERT Date of Birth: July 10, 1956   Transition of Care [facility]) CM/SW Contact:    Boneta Lucks, RN Phone Number: 10/26/2021, 11:39 AM    Transition of Care Department [facility]) has reviewed patient and no TOC needs have been identified at this time. We will continue to monitor patient advancement through interdisciplinary progression rounds. If new patient transition needs arise, please place a TOC consult.

## 2021-10-26 NOTE — Progress Notes (Addendum)
Progress Note    Sarah Pope  PRF:[id number] DOB: 06/17/1956  DOA: 10/25/2021 PCP: Emily Nation, MD      Brief Narrative:    Medical records reviewed and are as summarized below:  Sarah Pope is a 66 y.o. female with medical history significant for mild intermittent asthma, hypertension, diabetes, hyperlipidemia, hypothyroidism, obesity, depression, anxiety, OSA not on CPAP at home, who presented to the hospital with cough, shortness of breath and wheezing for about 2 days duration.  Her husband has been hospitalized for respiratory infection.  She was found to have acute asthma exacerbation complicated by acute hypoxemic respiratory failure.    Assessment/Plan:   Principal Problem:   Acute respiratory failure with hypoxia (HCC) Active Problems:   OBESITY   DM type 2 (diabetes mellitus, type 2) (HCC)   HTN (hypertension)   OSA (obstructive sleep apnea)   Depression with anxiety   Asthma, chronic, unspecified asthma severity, with acute exacerbation    Body mass index is 33.24 kg/m.  (Obesity)  Acute asthma exacerbation: Continue IV steroids and bronchodilators.  COVID and flu tests were negative.  Discontinue IV fluids.  Acute hypoxemic respiratory failure: She is on 2 L/min oxygen via nasal cannula.  Taper off oxygen as able.  CTA chest was negative.  Type II DM with hyperglycemia: Continue Semglee and NovoLog with meals.  OSA: Continue CPAP at night while in the hospital.  She said she no longer has CPAP machine at home and has not used this for a while time.  Recommended outpatient follow-up with pulmonologist for sleep study.  Other comorbidities include hypertension, hyperlipidemia, hypothyroidism, depression, anxiety   Diet Order             Diet heart healthy/carb modified Room service appropriate? Yes; Fluid consistency: Thin  Diet effective now                      Consultants: None  Procedures: None    Medications:     aspirin EC  81 mg Oral Q breakfast   atorvastatin  20 mg Oral q1800   citalopram  40 mg Oral Daily   dextromethorphan-guaiFENesin  1 tablet Oral BID   heparin  5,000 Units Subcutaneous Q8H   insulin aspart  0-20 Units Subcutaneous TID WC   insulin aspart  0-5 Units Subcutaneous QHS   insulin aspart  4 Units Subcutaneous TID WC   insulin glargine-yfgn  100 Units Subcutaneous Daily   ipratropium-albuterol  3 mL Nebulization Q6H   levothyroxine  175 mcg Oral Daily   loratadine  10 mg Oral Daily   methylPREDNISolone (SOLU-MEDROL) injection  40 mg Intravenous Q12H   mometasone-formoterol  2 puff Inhalation BID   pantoprazole  40 mg Oral Daily   sodium chloride flush  3 mL Intravenous Q12H   sodium chloride flush  3 mL Intravenous Q12H   traZODone  100 mg Oral QHS   Continuous Infusions:  sodium chloride     sodium chloride 150 mL/hr at 10/26/21 0521     Anti-infectives (From admission, onward)    None              Family Communication/Anticipated D/C date and plan/Code Status   DVT prophylaxis: heparin injection 5,000 Units Start: 10/25/21 1630 SCDs Start: 10/25/21 1624 Place TED hose Start: 10/25/21 1624     Code Status: Full Code  Family Communication: None Disposition Plan: Possible discharge to home tomorrow   Status is: Observation  The patient will require care spanning > 2 midnights and should be moved to inpatient because: Hypoxic on IV steroids she complains of cough productive of yellowish sputum, wheezing and shortness of breath.          Subjective:   She complains of cough productive of yellowish sputum, wheezing and shortness of breath.   Objective:    Vitals:   10/26/21 0348 10/26/21 0544 10/26/21 0818 10/26/21 0819  BP: (!) 142/81 125/68    Pulse: 97 88    Resp: 18 16    Temp: (!) 97.5 F (36.4 C) 98 F (36.7 C)    TempSrc: Oral Oral    SpO2: 100% 92% 97% 100%  Weight:      Height:       No data found.   Intake/Output  Summary (Last 24 hours) at 10/26/2021 1104 Last data filed at 10/26/2021 0900 Gross per 24 hour  Intake 1350.46 ml  Output --  Net 1350.46 ml   Filed Weights   10/25/21 1245 10/25/21 1641  Weight: 92 kg 90.6 kg    Exam:   GEN: NAD SKIN: Warm and dry EYES: No pallor or icterus ENT: MMM CV: RRR PULM: b/l wheezing, bibasilar rales ABD: soft, ND, NT, +BS CNS: AAO x 3, non focal EXT: No edema or tenderness       Data Reviewed:   I have personally reviewed following labs and imaging studies:  Labs: Labs show the following:   Basic Metabolic Panel: Recent Labs  Lab 10/25/21 1205 10/26/21 0632  NA 139 137  K 4.3 4.4  CL 99 103  CO2 31 28  GLUCOSE 222* 227*  BUN 11 13  CREATININE 0.60 0.56  CALCIUM 9.3 8.4*   GFR Estimated Creatinine Clearance: 79 mL/min (by C-G formula based on SCr of 0.56 mg/dL). Liver Function Tests: No results for input(s): AST, ALT, ALKPHOS, BILITOT, PROT, ALBUMIN in the last 168 hours. No results for input(s): LIPASE, AMYLASE in the last 168 hours. No results for input(s): AMMONIA in the last 168 hours. Coagulation profile Recent Labs  Lab 10/25/21 1242  INR 1.0    CBC: Recent Labs  Lab 10/25/21 1205 10/26/21 0632  WBC 13.0* 12.4*  HGB 13.7 11.3*  HCT 43.1 35.9*  MCV 83.0 82.2  PLT 250 204   Cardiac Enzymes: No results for input(s): CKTOTAL, CKMB, CKMBINDEX, TROPONINI in the last 168 hours. BNP (last 3 results) No results for input(s): PROBNP in the last 8760 hours. CBG: Recent Labs  Lab 10/25/21 1658 10/25/21 2206 10/26/21 0723  GLUCAP 290* 335* 208*   D-Dimer: No results for input(s): DDIMER in the last 72 hours. Hgb A1c: Recent Labs    10/25/21 1206  HGBA1C 9.0*   Lipid Profile: No results for input(s): CHOL, HDL, LDLCALC, TRIG, CHOLHDL, LDLDIRECT in the last 72 hours. Thyroid function studies: No results for input(s): TSH, T4TOTAL, T3FREE, THYROIDAB in the last 72 hours.  Invalid input(s): FREET3 Anemia  work up: No results for input(s): VITAMINB12, FOLATE, FERRITIN, TIBC, IRON, RETICCTPCT in the last 72 hours. Sepsis Labs: Recent Labs  Lab 10/25/21 1205 10/25/21 1246 10/26/21 0632  WBC 13.0*  --  12.4*  LATICACIDVEN  --  1.0  --     Microbiology Recent Results (from the past 240 hour(s))  Resp Panel by RT-PCR (Flu A&B, Covid) Nasopharyngeal Swab     Status: None   Collection Time: 10/25/21 12:08 PM   Specimen: Nasopharyngeal Swab; Nasopharyngeal(NP) swabs in vial transport medium  Result  Value Ref Range Status   SARS Coronavirus 2 by RT PCR NEGATIVE NEGATIVE Final    Comment: (NOTE) SARS-CoV-2 target nucleic acids are NOT DETECTED.  The SARS-CoV-2 RNA is generally detectable in upper respiratory specimens during the acute phase of infection. The lowest concentration of SARS-CoV-2 viral copies this assay can detect is 138 copies/mL. A negative result does not preclude SARS-Cov-2 infection and should not be used as the sole basis for treatment or other patient management decisions. A negative result may occur with  improper specimen collection/handling, submission of specimen other than nasopharyngeal swab, presence of viral mutation(s) within the areas targeted by this assay, and inadequate number of viral copies(<138 copies/mL). A negative result must be combined with clinical observations, patient history, and epidemiological information. The expected result is Negative.  Fact Sheet for Patients:  EntrepreneurPulse.com.au  Fact Sheet for Healthcare Providers:  IncredibleEmployment.be  This test is no t yet approved or cleared by the [place] FDA and  has been authorized for detection and/or diagnosis of SARS-CoV-2 by FDA under an Emergency Use Authorization (EUA). This EUA will remain  in effect (meaning this test can be used) for the duration of the COVID-19 declaration under Section 564(b)(1) of the Act, 21 U.S.C.section  360bbb-3(b)(1), unless the authorization is terminated  or revoked sooner.       Influenza A by PCR NEGATIVE NEGATIVE Final   Influenza B by PCR NEGATIVE NEGATIVE Final    Comment: (NOTE) The Xpert Xpress SARS-CoV-2/FLU/RSV plus assay is intended as an aid in the diagnosis of influenza from Nasopharyngeal swab specimens and should not be used as a sole basis for treatment. Nasal washings and aspirates are unacceptable for Xpert Xpress SARS-CoV-2/FLU/RSV testing.  Fact Sheet for Patients: EntrepreneurPulse.com.au  Fact Sheet for Healthcare Providers: IncredibleEmployment.be  This test is not yet approved or cleared by the [place] FDA and has been authorized for detection and/or diagnosis of SARS-CoV-2 by FDA under an Emergency Use Authorization (EUA). This EUA will remain in effect (meaning this test can be used) for the duration of the COVID-19 declaration under Section 564(b)(1) of the Act, 21 U.S.C. section 360bbb-3(b)(1), unless the authorization is terminated or revoked.  Performed at [facility], [address], [place], [place] [address]   Blood Culture (routine x 2)     Status: None (Preliminary result)   Collection Time: 10/25/21 12:42 PM   Specimen: BLOOD RIGHT FOREARM  Result Value Ref Range Status   Specimen Description BLOOD RIGHT FOREARM  Final   Special Requests   Final    BOTTLES DRAWN AEROBIC AND ANAEROBIC Blood Culture adequate volume   Culture   Final    NO GROWTH < 24 HOURS Performed at [facility], [address] [place], [place] [address]    Report Status PENDING  Incomplete  Blood Culture (routine x 2)     Status: None (Preliminary result)   Collection Time: 10/25/21  1:03 PM   Specimen: BLOOD RIGHT WRIST  Result Value Ref Range Status   Specimen Description BLOOD RIGHT WRIST  Final   Special Requests   Final    BOTTLES DRAWN AEROBIC AND ANAEROBIC Blood Culture results may not be optimal due to an  inadequate volume of blood received in culture bottles   Culture   Final    NO GROWTH < 24 HOURS Performed at [facility], [address] [place], [place] [address]    Report Status PENDING  Incomplete    Procedures and diagnostic studies:  DG  Chest 2 View  Result Date: 10/25/2021 CLINICAL DATA:  chest pain EXAM: CHEST - 2 VIEW COMPARISON:  10/12/2021. FINDINGS: Cardiomediastinal silhouette is within normal limits. No consolidation. No visible pleural effusions or pneumothorax. Mild biapical pleuroparenchymal scarring. Degenerative changes of the spine. IMPRESSION: No evidence of acute cardiopulmonary disease. Electronically Signed   By: Margaretha Sheffield M.D.   On: 10/25/2021 12:19   CT Angio Chest PE W/Cm &/Or Wo Cm  Result Date: 10/25/2021 CLINICAL DATA:  Cough,  Pulmonary embolism (PE) suspected, high prob EXAM: CT ANGIOGRAPHY CHEST WITH CONTRAST TECHNIQUE: Multidetector CT imaging of the chest was performed using the standard protocol during bolus administration of intravenous contrast. Multiplanar CT image reconstructions and MIPs were obtained to evaluate the vascular anatomy. CONTRAST:  49mL OMNIPAQUE IOHEXOL 350 MG/ML SOLN COMPARISON:  Chest x-ray 10/25/2021 FINDINGS: Cardiovascular: Satisfactory opacification of the pulmonary arteries to the segmental level. No evidence of pulmonary embolism. Normal heart size. No significant pericardial effusion. The thoracic aorta is normal in caliber. Mild atherosclerotic plaque of the thoracic aorta. Coronary artery calcifications. Mediastinum/Nodes: No enlarged mediastinal, hilar, or axillary lymph nodes. Thyroid gland, trachea, and esophagus demonstrate no significant findings. Lungs/Pleura: No focal consolidation. No pulmonary nodule. No pulmonary mass. No pleural effusion. No pneumothorax. Upper Abdomen: No acute abnormality. Musculoskeletal: No chest wall abnormality. No suspicious lytic or blastic osseous lesions. No acute displaced fracture.  Multilevel degenerative changes of the spine. Review of the MIP images confirms the above findings. IMPRESSION: 1. No pulmonary embolus. 2. No acute intrathoracic abnormality. 3.  Aortic Atherosclerosis (ICD10-I70.0). Electronically Signed   By: Iven Finn M.D.   On: 10/25/2021 15:04               LOS: 0 days   Brecken Walth  [facility]   Pager on [email or web address]. If 7PM-7AM, please contact night-coverage at [email or web address]     10/26/2021, 11:04 AM

## 2021-10-26 NOTE — Progress Notes (Signed)
Hypoglycemic Event  CBG: 59   Treatment: 4 oz juice/soda  Symptoms: None  Follow-up CBG: Time:1710 CBG Result: 90   Comments/MD notified:Amion sent to Dr. Mal Misty to make aware.

## 2021-10-26 NOTE — Progress Notes (Signed)
Pt resting with CPAP on, treatment on hold, pt in no distress

## 2021-10-26 NOTE — Progress Notes (Signed)
Patient declined CPAP. Unit still at bedside. Told patient to call if she changed her mind.

## 2021-10-27 ENCOUNTER — Encounter (HOSPITAL_COMMUNITY): Payer: Self-pay | Admitting: Internal Medicine

## 2021-10-27 ENCOUNTER — Other Ambulatory Visit: Payer: Self-pay

## 2021-10-27 LAB — GLUCOSE, CAPILLARY
Glucose-Capillary: 138 mg/dL — ABNORMAL HIGH (ref 70–99)
Glucose-Capillary: 300 mg/dL — ABNORMAL HIGH (ref 70–99)
Glucose-Capillary: 279 mg/dL — ABNORMAL HIGH (ref 70–99)
Glucose-Capillary: 201 mg/dL — ABNORMAL HIGH (ref 70–99)
Glucose-Capillary: 216 mg/dL — ABNORMAL HIGH (ref 70–99)

## 2021-10-27 MED ORDER — INSULIN ASPART 100 UNIT/ML IJ SOLN
6.0000 [IU] | Freq: Three times a day (TID) | INTRAMUSCULAR | Status: DC
  Administered 2021-10-27 – 2021-10-28 (×3): 6 [IU] via SUBCUTANEOUS

## 2021-10-27 MED ORDER — HYDROCODONE BIT-HOMATROP MBR 5-1.5 MG/5ML PO SOLN
5.0000 mL | Freq: Four times a day (QID) | ORAL | Status: DC | PRN
  Administered 2021-10-27 – 2021-10-28 (×4): 5 mL via ORAL
  Filled 2021-10-27 (×4): qty 5

## 2021-10-27 MED ORDER — INSULIN ASPART 100 UNIT/ML IJ SOLN: 8.0000 [IU] | Freq: Three times a day (TID) | INTRAMUSCULAR | Status: DC

## 2021-10-27 NOTE — Progress Notes (Signed)
Pt refuse CPAP for tonight and informed to have nurse call if she changes her mind

## 2021-10-27 NOTE — Progress Notes (Signed)
Progress Note    Sarah Pope  CZY:[id number] DOB: [birth year]  DOA: 10/25/2021 PCP: Roy Nation, MD      Brief Narrative:    Medical records reviewed and are as summarized below:  Sarah Pope is a 66 y.o. female with medical history significant for mild intermittent asthma, hypertension, diabetes, hyperlipidemia, hypothyroidism, obesity, depression, anxiety, OSA not on CPAP at home, who presented to the hospital with cough, shortness of breath and wheezing for about 2 days duration.  Her husband has been hospitalized for respiratory infection.  She was found to have acute asthma exacerbation complicated by acute hypoxemic respiratory failure.    Assessment/Plan:   Principal Problem:   Acute respiratory failure with hypoxia (HCC) Active Problems:   OBESITY   DM type 2 (diabetes mellitus, type 2) (HCC)   HTN (hypertension)   OSA (obstructive sleep apnea)   Depression with anxiety   Asthma, chronic, unspecified asthma severity, with acute exacerbation    Body mass index is 33.24 kg/m.  (Obesity)  Acute asthma exacerbation: Continue IV steroids and bronchodilators.  Add Hycodan syrup for cough.  COVID and flu tests were negative.    Acute hypoxemic respiratory failure: She remains on 2 L/min oxygen via nasal cannula.  Wean off oxygen as able.  CTA chest was negative.  Type II DM with hyperglycemia, s/p hypoglycemia on 10/26/2021: Glucose dropped to 59 yesterday.  Insulin glargine was decreased from 100 to 80 units daily.  Increase NovoLog from 4 units 3 times daily to 6 units 3 times daily with meals.  Use NovoLog as needed for hyperglycemia  OSA: Continue CPAP at night while in the hospital.  She said she no longer has CPAP machine at home and has not used this for a while time.  Recommended outpatient follow-up with pulmonologist for sleep study.  Other comorbidities include hypertension, hyperlipidemia, hypothyroidism, depression, anxiety   Diet Order              Diet heart healthy/carb modified Room service appropriate? Yes; Fluid consistency: Thin  Diet effective now                      Consultants: None  Procedures: None    Medications:    aspirin EC  81 mg Oral Q breakfast   atorvastatin  20 mg Oral q1800   citalopram  40 mg Oral Daily   dextromethorphan-guaiFENesin  1 tablet Oral BID   heparin  5,000 Units Subcutaneous Q8H   insulin aspart  0-20 Units Subcutaneous TID WC   insulin aspart  0-5 Units Subcutaneous QHS   insulin aspart  4 Units Subcutaneous TID WC   insulin glargine-yfgn  80 Units Subcutaneous Daily   ipratropium-albuterol  3 mL Nebulization Q6H   levothyroxine  175 mcg Oral Daily   loratadine  10 mg Oral Daily   methylPREDNISolone (SOLU-MEDROL) injection  40 mg Intravenous Q12H   mometasone-formoterol  2 puff Inhalation BID   pantoprazole  40 mg Oral Daily   sodium chloride flush  3 mL Intravenous Q12H   sodium chloride flush  3 mL Intravenous Q12H   traZODone  100 mg Oral QHS   Continuous Infusions:  sodium chloride 10 mL/hr at 10/26/21 1639     Anti-infectives (From admission, onward)    None              Family Communication/Anticipated D/C date and plan/Code Status   DVT prophylaxis: heparin injection 5,000 Units Start:  10/25/21 1630 SCDs Start: 10/25/21 1624 Place TED hose Start: 10/25/21 1624     Code Status: Full Code  Family Communication: None Disposition Plan: Possible discharge to home tomorrow   Status is: Inpatient  Remains inpatient appropriate because: Asthma exacerbation               Subjective:   She complains of frequent coughing spells, wheezing and shortness of breath with minimal activity   Objective:    Vitals:   10/27/21 0542 10/27/21 0959 10/27/21 1337 10/27/21 1456  BP: 135/70  (!) 141/61   Pulse: 83  84   Resp:   18   Temp: 98.3 F (36.8 C)  98 F (36.7 C)   TempSrc:   Oral   SpO2: 96% 98% 98% 96%  Weight:       Height:       No data found.   Intake/Output Summary (Last 24 hours) at 10/27/2021 1604 Last data filed at 10/27/2021 1123 Gross per 24 hour  Intake 390.47 ml  Output --  Net 390.47 ml   Filed Weights   10/25/21 1245 10/25/21 1641  Weight: 92 kg 90.6 kg    Exam:   GEN: NAD SKIN: No rash EYES: EOMI ENT: MMM CV: RRR PULM: Decreased air entry bilaterally, diffuse bilateral wheezing ABD: soft, ND, NT, +BS CNS: AAO x 3, non focal EXT: No edema or tenderness        Data Reviewed:   I have personally reviewed following labs and imaging studies:  Labs: Labs show the following:   Basic Metabolic Panel: Recent Labs  Lab 10/25/21 1205 10/26/21 0632  NA 139 137  K 4.3 4.4  CL 99 103  CO2 31 28  GLUCOSE 222* 227*  BUN 11 13  CREATININE 0.60 0.56  CALCIUM 9.3 8.4*   GFR Estimated Creatinine Clearance: 79 mL/min (by C-G formula based on SCr of 0.56 mg/dL). Liver Function Tests: No results for input(s): AST, ALT, ALKPHOS, BILITOT, PROT, ALBUMIN in the last 168 hours. No results for input(s): LIPASE, AMYLASE in the last 168 hours. No results for input(s): AMMONIA in the last 168 hours. Coagulation profile Recent Labs  Lab 10/25/21 1242  INR 1.0    CBC: Recent Labs  Lab 10/25/21 1205 10/26/21 0632  WBC 13.0* 12.4*  HGB 13.7 11.3*  HCT 43.1 35.9*  MCV 83.0 82.2  PLT 250 204   Cardiac Enzymes: No results for input(s): CKTOTAL, CKMB, CKMBINDEX, TROPONINI in the last 168 hours. BNP (last 3 results) No results for input(s): PROBNP in the last 8760 hours. CBG: Recent Labs  Lab 10/26/21 1712 10/26/21 2200 10/27/21 0748 10/27/21 1117 10/27/21 1242  GLUCAP 90 215* 138* 300* 279*   D-Dimer: No results for input(s): DDIMER in the last 72 hours. Hgb A1c: Recent Labs    10/25/21 1206  HGBA1C 9.0*   Lipid Profile: No results for input(s): CHOL, HDL, LDLCALC, TRIG, CHOLHDL, LDLDIRECT in the last 72 hours. Thyroid function studies: No results for  input(s): TSH, T4TOTAL, T3FREE, THYROIDAB in the last 72 hours.  Invalid input(s): FREET3 Anemia work up: No results for input(s): VITAMINB12, FOLATE, FERRITIN, TIBC, IRON, RETICCTPCT in the last 72 hours. Sepsis Labs: Recent Labs  Lab 10/25/21 1205 10/25/21 1246 10/26/21 0632  WBC 13.0*  --  12.4*  LATICACIDVEN  --  1.0  --     Microbiology Recent Results (from the past 240 hour(s))  Resp Panel by RT-PCR (Flu A&B, Covid) Nasopharyngeal Swab     Status:  None   Collection Time: 10/25/21 12:08 PM   Specimen: Nasopharyngeal Swab; Nasopharyngeal(NP) swabs in vial transport medium  Result Value Ref Range Status   SARS Coronavirus 2 by RT PCR NEGATIVE NEGATIVE Final    Comment: (NOTE) SARS-CoV-2 target nucleic acids are NOT DETECTED.  The SARS-CoV-2 RNA is generally detectable in upper respiratory specimens during the acute phase of infection. The lowest concentration of SARS-CoV-2 viral copies this assay can detect is 138 copies/mL. A negative result does not preclude SARS-Cov-2 infection and should not be used as the sole basis for treatment or other patient management decisions. A negative result may occur with  improper specimen collection/handling, submission of specimen other than nasopharyngeal swab, presence of viral mutation(s) within the areas targeted by this assay, and inadequate number of viral copies(<138 copies/mL). A negative result must be combined with clinical observations, patient history, and epidemiological information. The expected result is Negative.  Fact Sheet for Patients:  EntrepreneurPulse.com.au  Fact Sheet for Healthcare Providers:  IncredibleEmployment.be  This test is no t yet approved or cleared by the [place] FDA and  has been authorized for detection and/or diagnosis of SARS-CoV-2 by FDA under an Emergency Use Authorization (EUA). This EUA will remain  in effect (meaning this test can be used) for  the duration of the COVID-19 declaration under Section 564(b)(1) of the Act, 21 U.S.C.section 360bbb-3(b)(1), unless the authorization is terminated  or revoked sooner.       Influenza A by PCR NEGATIVE NEGATIVE Final   Influenza B by PCR NEGATIVE NEGATIVE Final    Comment: (NOTE) The Xpert Xpress SARS-CoV-2/FLU/RSV plus assay is intended as an aid in the diagnosis of influenza from Nasopharyngeal swab specimens and should not be used as a sole basis for treatment. Nasal washings and aspirates are unacceptable for Xpert Xpress SARS-CoV-2/FLU/RSV testing.  Fact Sheet for Patients: EntrepreneurPulse.com.au  Fact Sheet for Healthcare Providers: IncredibleEmployment.be  This test is not yet approved or cleared by the [place] FDA and has been authorized for detection and/or diagnosis of SARS-CoV-2 by FDA under an Emergency Use Authorization (EUA). This EUA will remain in effect (meaning this test can be used) for the duration of the COVID-19 declaration under Section 564(b)(1) of the Act, 21 U.S.C. section 360bbb-3(b)(1), unless the authorization is terminated or revoked.  Performed at [facility], [address] [name], [place] [address]   Blood Culture (routine x 2)     Status: None (Preliminary result)   Collection Time: 10/25/21 12:42 PM   Specimen: BLOOD RIGHT FOREARM  Result Value Ref Range Status   Specimen Description BLOOD RIGHT FOREARM  Final   Special Requests   Final    BOTTLES DRAWN AEROBIC AND ANAEROBIC Blood Culture adequate volume   Culture   Final    NO GROWTH 2 DAYS Performed at [facility], [address], [place], [place] [address]    Report Status PENDING  Incomplete  Blood Culture (routine x 2)     Status: None (Preliminary result)   Collection Time: 10/25/21  1:03 PM   Specimen: BLOOD RIGHT WRIST  Result Value Ref Range Status   Specimen Description BLOOD RIGHT WRIST  Final   Special Requests   Final     BOTTLES DRAWN AEROBIC AND ANAEROBIC Blood Culture results may not be optimal due to an inadequate volume of blood received in culture bottles   Culture   Final    NO GROWTH 2 DAYS Performed at [facility], [address], [place],  [place] [address]    Report Status PENDING  Incomplete    Procedures and diagnostic studies:  No results found.             LOS: 1 day   Dontell Mian  [facility]   Pager on [email or web address]. If 7PM-7AM, please contact night-coverage at [email or web address]     10/27/2021, 4:04 PM

## 2021-10-27 NOTE — Plan of Care (Signed)
  Problem: Education: Goal: Knowledge of General Education information will improve Description Including pain rating scale, medication(s)/side effects and non-pharmacologic comfort measures Outcome: Progressing   Problem: Health Behavior/Discharge Planning: Goal: Ability to manage health-related needs will improve Outcome: Progressing   

## 2021-10-27 NOTE — Progress Notes (Signed)
Inpatient Diabetes Program Recommendations  AACE/ADA: New Consensus Statement on Inpatient Glycemic Control   Target Ranges:  Prepandial:   less than 140 mg/dL      Peak postprandial:   less than 180 mg/dL (1-2 hours)      Critically ill patients:  140 - 180 mg/dL    Latest Reference Range & Units 10/26/21 07:23 10/26/21 12:05 10/26/21 16:52 10/26/21 16:55 10/26/21 17:12 10/26/21 22:00  Glucose-Capillary 70 - 99 mg/dL 208 (H)  Novolog 11 units 217 (H)  Novolog 11 units  Semglee 100 units 59 (L) 61 (L) 90 215 (H)  Novolog 2 units    Latest Reference Range & Units 10/25/21 12:06  Hemoglobin A1C 4.8 - 5.6 % 9.0 (H)    Latest Reference Range & Units 08/01/21 09:03  HbA1c, POC (controlled diabetic range) 0.0 - 7.0 % 10.2    Review of Glycemic Control  Diabetes history: DM2 Outpatient Diabetes medications: Lantus 80 units QHS, Humalog 12-18 units TID with meals, Metformin 1000 mg BID Current orders for Inpatient glycemic control: Semglee 80 units daily, Novolog 0-20 units TID with meals, Novolog 0-5 units QHS, Novolog 4 units TID with meals; Solumedrol 40 mg Q12H  Inpatient Diabetes Program Recommendations:    Insulin: Noted patient received Semglee 100 units on 10/25/21 at 21:26 and Semglee 100 units on 10/26/21 at 12:13 (doses given within 15 hours of each other) and glucose down to 59 mg/dl at 16:52 on 10/26/21.  Patient is ordered to get Semglee 80 units today. If steroids are continued, may want to consider increasing meal coverage to Novolog 8 units TID with meals.  NOTE: In reviewing chart, noted patient sees Abbe Amsterdam, NP [facility]) and was last sen 08/01/21; A1C was 10.2% on 08/01/21. Current A1C 9% on 10/25/21 which is slightly improved but still not at goal of 7% or less.  Spoke with patient about diabetes and home regimen for diabetes control. Patient reports being followed by Endocrinology for diabetes management and currently taking Lantus 80 units QHS, Humalog  12-18 units TID with meals, and Metformin 1000 mg BID as an outpatient for diabetes control. Patient reports taking DM medications as prescribed. Patient states that she has been on steroids recently which are contributing to higher glucose. Patient reports glucose has been running higher but she really tries to keep him controlled.  Discussed A1C results (9% on 10/25/21) which is slightly improved from last A1C of 10.2% on 08/01/21.  Discussed glucose and A1C goals. Discussed importance of checking CBGs and maintaining good CBG control to prevent long-term and short-term complications. Explained how hyperglycemia leads to damage within blood vessels which lead to the common complications seen with uncontrolled diabetes. Stressed to the patient the importance of improving glycemic control to prevent further complications from uncontrolled diabetes. Discussed impact of nutrition, exercise, stress, sickness, and medications on diabetes control. Encouraged patient to follow up with Endocrinology and be sure to let them know she was on Solumedrol in the hospital which may contribute to higher A1C if checked in the next 2-3 months. Patient verbalized understanding of information discussed and reports no further questions at this time related to diabetes.  Thanks, Barnie Alderman, RN, MSN, CDE Diabetes Coordinator Inpatient Diabetes Program [phone] (Team Pager from 8am to 5pm)

## 2021-10-28 LAB — GLUCOSE, CAPILLARY
Glucose-Capillary: 107 mg/dL — ABNORMAL HIGH (ref 70–99)
Glucose-Capillary: 267 mg/dL — ABNORMAL HIGH (ref 70–99)

## 2021-10-28 MED ORDER — PREDNISONE 20 MG PO TABS: 40.0000 mg | ORAL_TABLET | Freq: Every day | ORAL | 0 refills | Status: AC

## 2021-10-28 MED ORDER — HYDROCODONE BIT-HOMATROP MBR 5-1.5 MG/5ML PO SOLN: 5.0000 mL | Freq: Four times a day (QID) | ORAL | 0 refills | Status: DC | PRN

## 2021-10-28 NOTE — Discharge Summary (Signed)
Physician Discharge Summary  Sarah Pope MWN:[id number] DOB: Jun 15, 1956 DOA: 10/25/2021  PCP: Binford Nation, MD  Admit date: 10/25/2021 Discharge date: 10/28/2021  Discharge disposition: Home   Recommendations for Outpatient Follow-Up:   Follow up with PCP in 1 week  Discharge Diagnosis:   Principal Problem:   Acute respiratory failure with hypoxia ([place]) Active Problems:   OBESITY   DM type 2 (diabetes mellitus, type 2) (HCC)   HTN (hypertension)   OSA (obstructive sleep apnea)   Depression with anxiety   Asthma, chronic, unspecified asthma severity, with acute exacerbation    Discharge Condition: Stable.  Diet recommendation:  Diet Order             Diet Carb Modified           Diet - low sodium heart healthy           Diet heart healthy/carb modified Room service appropriate? Yes; Fluid consistency: Thin  Diet effective now                     Code Status: Full Code     Hospital Course:   Sarah Pope is a 66 y.o. female with medical history significant for mild intermittent asthma, hypertension, diabetes, hyperlipidemia, hypothyroidism, obesity, depression, anxiety, OSA not on CPAP at home, who presented to the hospital with cough, shortness of breath and wheezing for about 2 days duration.  Her husband has been hospitalized for respiratory infection.   She was found to have acute asthma exacerbation complicated by acute hypoxemic respiratory failure.  She was treated with IV steroids and bronchodilators.  She required up to 5 L/min oxygen via nasal cannula.  Her condition slowly improved.  She has been successfully weaned off of oxygen.  She was able to tolerate room air with ambulation with oxygen saturation of 96%.  She is deemed stable for discharge to home.        Discharge Exam:    Vitals:   10/27/21 2059 10/27/21 2250 10/28/21 0612 10/28/21 1002  BP:  130/66 (!) 151/78   Pulse:  87 89   Resp:      Temp:  98.1 F (36.7 C)  98.1 F (36.7 C)   TempSrc:  Oral Oral   SpO2: 98% 99% 96% 95%  Weight:      Height:         GEN: NAD SKIN: No rash EYES: EOMI ENT: MMM CV: RRR PULM: Mild occasional wheezing.  No rales heard ABD: soft, obese, NT, +BS CNS: AAO x 3, non focal EXT: No edema or tenderness   The results of significant diagnostics from this hospitalization (including imaging, microbiology, ancillary and laboratory) are listed below for reference.     Procedures and Diagnostic Studies:   DG Chest 2 View  Result Date: 10/25/2021 CLINICAL DATA:  chest pain EXAM: CHEST - 2 VIEW COMPARISON:  10/12/2021. FINDINGS: Cardiomediastinal silhouette is within normal limits. No consolidation. No visible pleural effusions or pneumothorax. Mild biapical pleuroparenchymal scarring. Degenerative changes of the spine. IMPRESSION: No evidence of acute cardiopulmonary disease. Electronically Signed   By: Margaretha Sheffield M.D.   On: 10/25/2021 12:19   CT Angio Chest PE W/Cm &/Or Wo Cm  Result Date: 10/25/2021 CLINICAL DATA:  Cough,  Pulmonary embolism (PE) suspected, high prob EXAM: CT ANGIOGRAPHY CHEST WITH CONTRAST TECHNIQUE: Multidetector CT imaging of the chest was performed using the standard protocol during bolus administration of intravenous contrast. Multiplanar CT image reconstructions and  MIPs were obtained to evaluate the vascular anatomy. CONTRAST:  14mL OMNIPAQUE IOHEXOL 350 MG/ML SOLN COMPARISON:  Chest x-ray 10/25/2021 FINDINGS: Cardiovascular: Satisfactory opacification of the pulmonary arteries to the segmental level. No evidence of pulmonary embolism. Normal heart size. No significant pericardial effusion. The thoracic aorta is normal in caliber. Mild atherosclerotic plaque of the thoracic aorta. Coronary artery calcifications. Mediastinum/Nodes: No enlarged mediastinal, hilar, or axillary lymph nodes. Thyroid gland, trachea, and esophagus demonstrate no significant findings. Lungs/Pleura: No focal  consolidation. No pulmonary nodule. No pulmonary mass. No pleural effusion. No pneumothorax. Upper Abdomen: No acute abnormality. Musculoskeletal: No chest wall abnormality. No suspicious lytic or blastic osseous lesions. No acute displaced fracture. Multilevel degenerative changes of the spine. Review of the MIP images confirms the above findings. IMPRESSION: 1. No pulmonary embolus. 2. No acute intrathoracic abnormality. 3.  Aortic Atherosclerosis (ICD10-I70.0). Electronically Signed   By: Iven Finn M.D.   On: 10/25/2021 15:04     Labs:   Basic Metabolic Panel: Recent Labs  Lab 10/25/21 1205 10/26/21 0632  NA 139 137  K 4.3 4.4  CL 99 103  CO2 31 28  GLUCOSE 222* 227*  BUN 11 13  CREATININE 0.60 0.56  CALCIUM 9.3 8.4*   GFR Estimated Creatinine Clearance: 79 mL/min (by C-G formula based on SCr of 0.56 mg/dL). Liver Function Tests: No results for input(s): AST, ALT, ALKPHOS, BILITOT, PROT, ALBUMIN in the last 168 hours. No results for input(s): LIPASE, AMYLASE in the last 168 hours. No results for input(s): AMMONIA in the last 168 hours. Coagulation profile Recent Labs  Lab 10/25/21 1242  INR 1.0    CBC: Recent Labs  Lab 10/25/21 1205 10/26/21 0632  WBC 13.0* 12.4*  HGB 13.7 11.3*  HCT 43.1 35.9*  MCV 83.0 82.2  PLT 250 204   Cardiac Enzymes: No results for input(s): CKTOTAL, CKMB, CKMBINDEX, TROPONINI in the last 168 hours. BNP: Invalid input(s): POCBNP CBG: Recent Labs  Lab 10/27/21 1242 10/27/21 1631 10/27/21 2248 10/28/21 0802 10/28/21 1123  GLUCAP 279* 201* 216* 107* 267*   D-Dimer No results for input(s): DDIMER in the last 72 hours. Hgb A1c No results for input(s): HGBA1C in the last 72 hours. Lipid Profile No results for input(s): CHOL, HDL, LDLCALC, TRIG, CHOLHDL, LDLDIRECT in the last 72 hours. Thyroid function studies No results for input(s): TSH, T4TOTAL, T3FREE, THYROIDAB in the last 72 hours.  Invalid input(s): FREET3 Anemia  work up No results for input(s): VITAMINB12, FOLATE, FERRITIN, TIBC, IRON, RETICCTPCT in the last 72 hours. Microbiology Recent Results (from the past 240 hour(s))  Resp Panel by RT-PCR (Flu A&B, Covid) Nasopharyngeal Swab     Status: None   Collection Time: 10/25/21 12:08 PM   Specimen: Nasopharyngeal Swab; Nasopharyngeal(NP) swabs in vial transport medium  Result Value Ref Range Status   SARS Coronavirus 2 by RT PCR NEGATIVE NEGATIVE Final    Comment: (NOTE) SARS-CoV-2 target nucleic acids are NOT DETECTED.  The SARS-CoV-2 RNA is generally detectable in upper respiratory specimens during the acute phase of infection. The lowest concentration of SARS-CoV-2 viral copies this assay can detect is 138 copies/mL. A negative result does not preclude SARS-Cov-2 infection and should not be used as the sole basis for treatment or other patient management decisions. A negative result may occur with  improper specimen collection/handling, submission of specimen other than nasopharyngeal swab, presence of viral mutation(s) within the areas targeted by this assay, and inadequate number of viral copies(<138 copies/mL). A negative result must be  combined with clinical observations, patient history, and epidemiological information. The expected result is Negative.  Fact Sheet for Patients:  EntrepreneurPulse.com.au  Fact Sheet for Healthcare Providers:  IncredibleEmployment.be  This test is no t yet approved or cleared by the [place] FDA and  has been authorized for detection and/or diagnosis of SARS-CoV-2 by FDA under an Emergency Use Authorization (EUA). This EUA will remain  in effect (meaning this test can be used) for the duration of the COVID-19 declaration under Section 564(b)(1) of the Act, 21 U.S.C.section 360bbb-3(b)(1), unless the authorization is terminated  or revoked sooner.       Influenza A by PCR NEGATIVE NEGATIVE Final   Influenza  B by PCR NEGATIVE NEGATIVE Final    Comment: (NOTE) The Xpert Xpress SARS-CoV-2/FLU/RSV plus assay is intended as an aid in the diagnosis of influenza from Nasopharyngeal swab specimens and should not be used as a sole basis for treatment. Nasal washings and aspirates are unacceptable for Xpert Xpress SARS-CoV-2/FLU/RSV testing.  Fact Sheet for Patients: EntrepreneurPulse.com.au  Fact Sheet for Healthcare Providers: IncredibleEmployment.be  This test is not yet approved or cleared by the [place] FDA and has been authorized for detection and/or diagnosis of SARS-CoV-2 by FDA under an Emergency Use Authorization (EUA). This EUA will remain in effect (meaning this test can be used) for the duration of the COVID-19 declaration under Section 564(b)(1) of the Act, 21 U.S.C. section 360bbb-3(b)(1), unless the authorization is terminated or revoked.  Performed at [facility], [address], [place], [place] [address]   Blood Culture (routine x 2)     Status: None (Preliminary result)   Collection Time: 10/25/21 12:42 PM   Specimen: BLOOD RIGHT FOREARM  Result Value Ref Range Status   Specimen Description BLOOD RIGHT FOREARM  Final   Special Requests   Final    BOTTLES DRAWN AEROBIC AND ANAEROBIC Blood Culture adequate volume   Culture   Final    NO GROWTH 3 DAYS Performed at [facility], [address] [place], [place] [address]    Report Status PENDING  Incomplete  Blood Culture (routine x 2)     Status: None (Preliminary result)   Collection Time: 10/25/21  1:03 PM   Specimen: BLOOD RIGHT WRIST  Result Value Ref Range Status   Specimen Description BLOOD RIGHT WRIST  Final   Special Requests   Final    BOTTLES DRAWN AEROBIC AND ANAEROBIC Blood Culture results may not be optimal due to an inadequate volume of blood received in culture bottles   Culture   Final    NO GROWTH 3 DAYS Performed at [facility], [address],  [place], [place] [address]    Report Status PENDING  Incomplete     Discharge Instructions:   Discharge Instructions     Diet - low sodium heart healthy   Complete by: As directed    Diet Carb Modified   Complete by: As directed    Increase activity slowly   Complete by: As directed       Allergies as of 10/28/2021       Reactions   Codeine Nausea Only   Sulfa Antibiotics    itching        Medication List     TAKE these medications    albuterol 108 (90 Base) MCG/ACT inhaler Commonly known as: VENTOLIN HFA INHALE TWO PUFFS INTO THE LUNGS EVERY 6 HOURS AS NEEDED FOR WHEEZING OR SHORTNESS OF BREATH What changed: See the new instructions.  aspirin EC 81 MG tablet Take 81 mg by mouth daily.   atorvastatin 20 MG tablet Commonly known as: LIPITOR Take 1 tablet (20 mg total) by mouth daily.   cetirizine 10 MG tablet Commonly known as: ZYRTEC Take 10 mg by mouth daily.   ciclopirox 8 % solution Commonly known as: Penlac Apply topically at bedtime. Apply over nail and surrounding skin. Apply daily over previous coat. After seven (7) days, may remove with alcohol and continue cycle.   citalopram 40 MG tablet Commonly known as: CELEXA Take 1 tablet (40 mg total) by mouth daily.   diclofenac 75 MG EC tablet Commonly known as: VOLTAREN TAKE ONE TABLET BY MOUTH TWICE daily PRN pain. TAKE WITH FOOD.   Fluticasone-Salmeterol 250-50 MCG/DOSE Aepb Commonly known as: Advair Diskus Inhale 1 puff into the lungs 2 (two) times daily.   FreeStyle Libre 14 Day Reader Kerrin Mo USE AS DIRECTED THREE TIMES DAILY.   FreeStyle Libre 14 Day Sensor Misc USE AS DIRECTED THREE TIMES DAILY WITH SLIDING SCALE INSULIN USE.   hydrochlorothiazide 12.5 MG tablet Commonly known as: HYDRODIURIL Take 1 tablet (12.5 mg total) by mouth daily.   HYDROcodone bit-homatropine 5-1.5 MG/5ML syrup Commonly known as: HYCODAN Take 5 mLs by mouth every 6 (six) hours as needed for cough.   insulin  glargine 100 unit/mL Sopn Commonly known as: LANTUS Inject 80 Units into the skin daily.   insulin lispro 100 UNIT/ML injection Commonly known as: HUMALOG Inject 0.12-0.18 mLs (12-18 Units total) into the skin 3 (three) times daily with meals.   levothyroxine 175 MCG tablet Commonly known as: SYNTHROID Take 1 tablet (175 mcg total) by mouth daily.   losartan 50 MG tablet Commonly known as: COZAAR Take 1 tablet (50 mg total) by mouth daily. Needs labs.   metFORMIN 1000 MG tablet Commonly known as: GLUCOPHAGE Take 1 tablet (1,000 mg total) by mouth 2 (two) times daily with a meal.   omeprazole 40 MG capsule Commonly known as: PRILOSEC Take 1 capsule (40 mg total) by mouth daily.   predniSONE 20 MG tablet Commonly known as: DELTASONE Take 2 tablets (40 mg total) by mouth daily for 2 days. Start taking on: October 29, 2021           If you experience worsening of your admission symptoms, develop shortness of breath, life threatening emergency, suicidal or homicidal thoughts you must seek medical attention immediately by calling 911 or calling your MD immediately  if symptoms less severe.   You must read complete instructions/literature along with all the possible adverse reactions/side effects for all the medicines you take and that have been prescribed to you. Take any new medicines after you have completely understood and accept all the possible adverse reactions/side effects.    Please note   You were cared for by a hospitalist during your hospital stay. If you have any questions about your discharge medications or the care you received while you were in the hospital after you are discharged, you can call the unit and asked to speak with the hospitalist on call if the hospitalist that took care of you is not available. Once you are discharged, your primary care physician will handle any further medical issues. Please note that NO REFILLS for any discharge medications will be  authorized once you are discharged, as it is imperative that you return to your primary care physician (or establish a relationship with a primary care physician if you do not have one) for your aftercare needs  so that they can reassess your need for medications and monitor your lab values.       Time coordinating discharge: 31 minutes  Signed:  Deadrian Toya  [facility] 10/28/2021, 12:40 PM   Pager on [email or web address]. If 7PM-7AM, please contact night-coverage at [email or web address]

## 2021-10-28 NOTE — Progress Notes (Signed)
PIV d/c'd; caht intact. Discharge instructions given. No concerns voiced. Pt encouraged to stop by pharmacy and pick up meds that were e-prescribed by Provider. Voiced understanding. Awaiting ride.

## 2021-10-28 NOTE — Progress Notes (Signed)
Inpatient Diabetes Program Recommendations  AACE/ADA: New Consensus Statement on Inpatient Glycemic Control   Target Ranges:  Prepandial:   less than 140 mg/dL      Peak postprandial:   less than 180 mg/dL (1-2 hours)      Critically ill patients:  140 - 180 mg/dL    Latest Reference Range & Units 10/28/21 08:02  Glucose-Capillary 70 - 99 mg/dL 107 (H)    Latest Reference Range & Units 10/27/21 07:48 10/27/21 11:17 10/27/21 12:42 10/27/21 16:31 10/27/21 22:48  Glucose-Capillary 70 - 99 mg/dL 138 (H) 300 (H) 279 (H) 201 (H) 216 (H)   Review of Glycemic Control  Diabetes history: DM2 Outpatient Diabetes medications: Lantus 80 units QHS, Humalog 12-18 units TID with meals, Metformin 1000 mg BID Current orders for Inpatient glycemic control: Semglee 80 units daily, Novolog 0-20 units TID with meals, Novolog 0-5 units QHS, Novolog 6 units TID with meals; Solumedrol 40 mg Q12H  Inpatient Diabetes Program Recommendations:    Insulin:  If steroids are continued, may want to consider increasing meal coverage to Novolog 9 units TID with meals.   Thanks, Barnie Alderman, RN, MSN, CDE Diabetes Coordinator Inpatient Diabetes Program [phone] (Team Pager from 8am to 5pm)

## 2021-10-28 NOTE — Progress Notes (Signed)
RN administering medication and then patient wants to finish breakfast.

## 2021-10-28 NOTE — Progress Notes (Signed)
SATURATION QUALIFICATIONS: (This note is used to comply with regulatory documentation for home oxygen)  Patient Saturations on Room Air at Rest = 96%  Patient Saturations on Room Air while Ambulating = 96%  Patient Saturations on 0 Liters of oxygen while Ambulating = 96%  Please briefly explain why patient needs home oxygen: Pt does not require oxygen.

## 2021-10-30 LAB — CULTURE, BLOOD (ROUTINE X 2)
Culture: NO GROWTH
Culture: NO GROWTH
Special Requests: ADEQUATE

## 2021-11-02 ENCOUNTER — Telehealth: Payer: Self-pay

## 2021-11-02 NOTE — Telephone Encounter (Signed)
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 Could we do a virtual visit Thursday jan 19 at 2pm to discuss in detail and some potential next steps  Sarah Abts MD

## 2021-11-02 NOTE — Telephone Encounter (Signed)
Patient notified and verbalized understanding. Pt agreeable to have a VV Jan 19 @ 2pm with Dr. Harl Bowie to discuss Echo results in detail and potential next steps.

## 2021-11-10 ENCOUNTER — Other Ambulatory Visit: Payer: Self-pay | Admitting: Cardiology

## 2021-11-10 ENCOUNTER — Encounter: Payer: Self-pay | Admitting: Cardiology

## 2021-11-10 ENCOUNTER — Ambulatory Visit (INDEPENDENT_AMBULATORY_CARE_PROVIDER_SITE_OTHER): Payer: Medicaid Other | Admitting: Cardiology

## 2021-11-10 ENCOUNTER — Other Ambulatory Visit: Payer: Self-pay

## 2021-11-10 VITALS — Ht 65.0 in | Wt 195.0 lb

## 2021-11-10 DIAGNOSIS — I350 Nonrheumatic aortic (valve) stenosis: Secondary | ICD-10-CM | POA: Diagnosis not present

## 2021-11-10 MED ORDER — SODIUM CHLORIDE 0.9% FLUSH: 3.0000 mL | Freq: Two times a day (BID) | INTRAVENOUS | Status: DC

## 2021-11-10 NOTE — H&P (View-Only) (Signed)
Virtual Visit via Telephone Note   This visit type was conducted due to national recommendations for restrictions regarding the COVID-19 Pandemic (e.g. social distancing) in an effort to limit this patient's exposure and mitigate transmission in our community.  Due to her co-morbid illnesses, this patient is at least at moderate risk for complications without adequate follow up.  This format is felt to be most appropriate for this patient at this time.  The patient did not have access to video technology/had technical difficulties with video requiring transitioning to audio format only (telephone).  All issues noted in this document were discussed and addressed.  No physical exam could be performed with this format.  Please refer to the patient's chart for her  consent to telehealth for [facility].    Date:  11/10/2021   ID:  Sarah Pope, DOB 17-May-1956, MRN [id number] The patient was identified using 2 identifiers.  Patient Location: Home Provider Location: Office/Clinic   PCP:   AFB Nation, MD   [facility] Providers Cardiologist:  Carlyle Dolly, MD     Evaluation Performed:  Follow-Up Visit  Chief Complaint:  Follow up visit  History of Present Illness:    Sarah Pope is a 66 y.o. female seen today for follow up of the following medical problems.    1. Aortic stenosis - Jan 2014 echo LVEF 55-60%, mild AV and MV anular calcification   05/2020 echo LVEF 65-70%, mod to severe AS (AVA VTI 0.77 mean 30 DI 0.30)     11/2020 echo LVEF 55-60%, mod to severe AS mean grad 30, AVA VTI 0.92, DI 0.24, SVI 38 -recent SOB/DOE. Example walking trashcans to the road now DOE.  - slight chest pains with activity  09/2021 echo: LVEF 55-60%, grade I dd, AV mean gradient 29, AVA VTI 0.60, DI 0.18 SVI 27 - there is a mean gradient measured with the Pedhoff probe of 41 mmHg. The 2D Doppler images likely underestimate gradient based on angle of acquisition.  - ongoing DOE with  activities    2. HTN - compliant with meds   3. Carotid stenosis - 11/2020 carotid US: RICA 6-96%, LICA NL       The patient does not have symptoms concerning for COVID-19 infection (fever, chills, cough, or new shortness of breath).    Past Medical History:  Diagnosis Date   Acute respiratory failure with hypercapnia (HCC)    Allergy    Anxiety    Asthma    Bronchitis with bronchospasm    Cancer ([place])    skin   Depression    DM type 2 (diabetes mellitus, type 2) ([place])    FATTY LIVER DISEASE 08/05/2008   GERD (gastroesophageal reflux disease)    Hyperlipidemia    Hypertension    Hypothyroidism    Kidney stones    Leukocytosis    Obesity, Class III, BMI 40-49.9 (morbid obesity) ([place])    PERSONAL HX COLONIC POLYPS 09/01/2008   Seasonal allergies    TRANSAMINASES, SERUM, ELEVATED 08/05/2008   Past Surgical History:  Procedure Laterality Date   COLONOSCOPY  2013   Repeat 10 years   [place]   realignment   NOSE SURGERY  2007   sinus infections   POLYPECTOMY     skin cancer removal     TONSILLECTOMY       Current Meds  Medication Sig   albuterol (VENTOLIN HFA) 108 (90 Base) MCG/ACT inhaler INHALE TWO PUFFS INTO THE LUNGS EVERY 6  HOURS AS NEEDED FOR WHEEZING OR SHORTNESS OF BREATH (Patient taking differently: Inhale 2 puffs into the lungs every 6 (six) hours as needed for shortness of breath.)   aspirin EC 81 MG tablet Take 81 mg by mouth daily.   atorvastatin (LIPITOR) 20 MG tablet Take 1 tablet (20 mg total) by mouth daily.   cetirizine (ZYRTEC) 10 MG tablet Take 10 mg by mouth daily.   ciclopirox (PENLAC) 8 % solution Apply topically at bedtime. Apply over nail and surrounding skin. Apply daily over previous coat. After seven (7) days, may remove with alcohol and continue cycle.   citalopram (CELEXA) 40 MG tablet Take 1 tablet (40 mg total) by mouth daily.   Continuous Blood Gluc Receiver (FREESTYLE LIBRE 14 DAY READER) DEVI USE AS DIRECTED THREE  TIMES DAILY.   Continuous Blood Gluc Sensor (FREESTYLE LIBRE 14 DAY SENSOR) MISC USE AS DIRECTED THREE TIMES DAILY WITH SLIDING SCALE INSULIN USE.   diclofenac (VOLTAREN) 75 MG EC tablet TAKE ONE TABLET BY MOUTH TWICE daily PRN pain. TAKE WITH FOOD.   Fluticasone-Salmeterol (ADVAIR DISKUS) 250-50 MCG/DOSE AEPB Inhale 1 puff into the lungs 2 (two) times daily.   hydrochlorothiazide (HYDRODIURIL) 12.5 MG tablet Take 1 tablet (12.5 mg total) by mouth daily.   HYDROcodone bit-homatropine (HYCODAN) 5-1.5 MG/5ML syrup Take 5 mLs by mouth every 6 (six) hours as needed for cough.   insulin glargine (LANTUS) 100 unit/mL SOPN Inject 80 Units into the skin daily.   insulin lispro (HUMALOG) 100 UNIT/ML injection Inject 0.12-0.18 mLs (12-18 Units total) into the skin 3 (three) times daily with meals.   levothyroxine (SYNTHROID) 175 MCG tablet Take 1 tablet (175 mcg total) by mouth daily.   losartan (COZAAR) 50 MG tablet Take 1 tablet (50 mg total) by mouth daily. Needs labs.   metFORMIN (GLUCOPHAGE) 1000 MG tablet Take 1 tablet (1,000 mg total) by mouth 2 (two) times daily with a meal.   omeprazole (PRILOSEC) 40 MG capsule Take 1 capsule (40 mg total) by mouth daily.     Allergies:   Codeine and Sulfa antibiotics   Social History   Tobacco Use   Smoking status: Former    Packs/day: 0.50    Years: 5.00    Pack years: 2.50    Types: Cigarettes    Quit date: 10/23/1982    Years since quitting: 39.0   Smokeless tobacco: Never  Vaping Use   Vaping Use: Never used  Substance Use Topics   Alcohol use: No   Drug use: No     Family Hx: The patient's family history includes Allergies in her daughter; Breast cancer in her mother; Cancer in her mother; Diabetes in her mother; Heart attack in her maternal grandmother; Hypertension in her mother; Prostate cancer in her father; Thyroid disease in her mother. There is no history of Colon cancer, Esophageal cancer, Rectal cancer, Stomach cancer, or Colon  polyps.  ROS:   Please see the history of present illness.     All other systems reviewed and are negative.   Prior CV studies:   The following studies were reviewed today:    05/2020 echo IMPRESSIONS     1. Left ventricular ejection fraction, by estimation, is 65 to 70%. The  left ventricle has normal function. The left ventricle has no regional  wall motion abnormalities. There is mild left ventricular hypertrophy.  Left ventricular diastolic parameters  are consistent with Grade I diastolic dysfunction (impaired relaxation).   2. Right ventricular systolic function is normal.  The right ventricular  size is normal. There is normal pulmonary artery systolic pressure. The  estimated right ventricular systolic pressure is 54.6 mmHg.   3. The mitral valve is grossly normal. Trivial mitral valve  regurgitation.   4. The aortic valve is tricuspid, moderately calcified with decreased  cusp excursion. Aortic valve regurgitation is trivial. Moderate to severe  aortic valve stenosis. Aortic valve area, by VTI measures 0.77 cm. Aortic  valve mean gradient measures 29.5  mmHg. Aortic valve Vmax measures 3.64 m/s. Dimentionless index 0.30.   5. The inferior vena cava is normal in size with greater than 50%  respiratory variability, suggesting right atrial pressure of 3 mmHg.   11/2020 echo IMPRESSIONS     1. Left ventricular ejection fraction, by estimation, is 55 to 60%. The  left ventricle has normal function. The left ventricle has no regional  wall motion abnormalities. Left ventricular diastolic parameters are  consistent with Grade I diastolic  dysfunction (impaired relaxation). Elevated left atrial pressure.   2. Right ventricular systolic function is normal. The right ventricular  size is normal.   3. The mitral valve is normal in structure. No evidence of mitral valve  regurgitation. No evidence of mitral stenosis.   4. The aortic valve is tricuspid. There is mild  calcification of the  aortic valve. There is mild thickening of the aortic valve. Aortic valve  regurgitation is not visualized. Moderate to severe aortic valve stenosis.  Aortic valve mean gradient measures  29.6 mmHg. Aortic valve peak gradient measures 49.4 mmHg. Aortic valve  area, by VTI measures 0.92 cm.    09/2021 echo IMPRESSIONS     1. Left ventricular ejection fraction, by estimation, is 55 to 60%. The  left ventricle has normal function. The left ventricle has no regional  wall motion abnormalities. There is mild left ventricular hypertrophy.  Left ventricular diastolic parameters  are consistent with Grade I diastolic dysfunction (impaired relaxation).   2. Right ventricular systolic function is normal. The right ventricular  size is normal. There is normal pulmonary artery systolic pressure.   3. The mitral valve is abnormal. No evidence of mitral valve  regurgitation. No evidence of mitral stenosis. Moderate mitral annular  calcification.   4. Morphologically tri leaflet vavle heavily calcified. Gradients stable  since February 2022 but smaller LVOT diameter used DVI/AVA and peak  velocity from suprasternal notch ( 4 m/sec ) as well as morphology suggest  severe AS despite mean gradient of  28.7 mmHhg Suggest right and left cath to r/o CAD and further evaluate .  The aortic valve is tricuspid. There is severe calcifcation of the aortic  valve. There is severe thickening of the aortic valve. Aortic valve  regurgitation is trivial. Moderate to  severe aortic valve stenosis.   5. The inferior vena cava is normal in size with greater than 50%  respiratory variability, suggesting right atrial pressure of 3 mmHg.      Labs/Other Tests and Data Reviewed:    EKG:  No ECG reviewed.  Recent Labs: 04/18/2021: ALT 17; TSH 5.150 10/25/2021: B Natriuretic Peptide 77.0 10/26/2021: BUN 13; Creatinine, Ser 0.56; Hemoglobin 11.3; Platelets 204; Potassium 4.4; Sodium 137   Recent  Lipid Panel Lab Results  Component Value Date/Time   CHOL 165 04/18/2021 09:43 AM   TRIG 159 (H) 04/18/2021 09:43 AM   HDL 45 04/18/2021 09:43 AM   CHOLHDL 3.7 04/18/2021 09:43 AM   CHOLHDL 3.4 12/16/2019 09:05 AM   LDLCALC 92 04/18/2021 09:43  AM    Wt Readings from Last 3 Encounters:  11/10/21 195 lb (88.5 kg)  10/25/21 199 lb 11.8 oz (90.6 kg)  10/12/21 202 lb 13.2 oz (92 kg)     Risk Assessment/Calculations:          Objective:    Vital Signs:  Ht 5\' 5"  (1.651 m)    Wt 195 lb (88.5 kg)    BMI 32.45 kg/m    Normal affect. Normal speech pattern and tone. Comfortable, no apparent distress. No audible signs of sob or wheezing.   ASSESSMENT & PLAN:    1. Aortic stenosis 11/2020 echo: LVEF 55-60%, grade I dd, AV mean gradient 29, AVA VTI 0.60, DI 0.18 SVI 27 - there is a mean gradient measured with the Pedhoff probe of 41 mmHg (image 101). The 2D Doppler images likely underestimate gradient based on angle of acquisition.  - severe AS that is now symptomatic. Will arrange LHC/RHC with either Dr Burt Knack or Angelena Form and also for there evaluation considering AV replacement.            Shared Decision Making/Informed Consent The risks [stroke (1 in 1000), death (1 in 1000), kidney failure [usually temporary] (1 in 500), bleeding (1 in 200), allergic reaction [possibly serious] (1 in 200)], benefits (diagnostic support and management of coronary artery disease) and alternatives of a cardiac catheterization were discussed in detail with Ms. Angelucci and she is willing to proceed.    COVID-19 Education: The signs and symptoms of COVID-19 were discussed with the patient and how to seek care for testing (follow up with PCP or arrange E-visit).  The importance of social distancing was discussed today.  Time:   Today, I have spent 18 minutes with the patient with telehealth technology discussing the above problems.     Medication Adjustments/Labs and Tests Ordered: Current medicines  are reviewed at length with the patient today.  Concerns regarding medicines are outlined above.   Tests Ordered: No orders of the defined types were placed in this encounter.   Medication Changes: No orders of the defined types were placed in this encounter.   Follow Up:  In Person 2 months  Signed, Carlyle Dolly, MD  11/10/2021 1:47 PM     [facility]

## 2021-11-10 NOTE — Progress Notes (Signed)
Virtual Visit via Telephone Note   This visit type was conducted due to national recommendations for restrictions regarding the COVID-19 Pandemic (e.g. social distancing) in an effort to limit this patient's exposure and mitigate transmission in our community.  Due to her co-morbid illnesses, this patient is at least at moderate risk for complications without adequate follow up.  This format is felt to be most appropriate for this patient at this time.  The patient did not have access to video technology/had technical difficulties with video requiring transitioning to audio format only (telephone).  All issues noted in this document were discussed and addressed.  No physical exam could be performed with this format.  Please refer to the patient's chart for her  consent to telehealth for [facility].    Date:  11/10/2021   ID:  Sarah Pope, DOB 1956-05-31, MRN [id number] The patient was identified using 2 identifiers.  Patient Location: Home Provider Location: Office/Clinic   PCP:  Hutto Nation, MD   [facility] Providers Cardiologist:  Carlyle Dolly, MD     Evaluation Performed:  Follow-Up Visit  Chief Complaint:  Follow up visit  History of Present Illness:    Sarah Pope is a 66 y.o. female seen today for follow up of the following medical problems.    1. Aortic stenosis - Jan 2014 echo LVEF 55-60%, mild AV and MV anular calcification   05/2020 echo LVEF 65-70%, mod to severe AS (AVA VTI 0.77 mean 30 DI 0.30)     11/2020 echo LVEF 55-60%, mod to severe AS mean grad 30, AVA VTI 0.92, DI 0.24, SVI 38 -recent SOB/DOE. Example walking trashcans to the road now DOE.  - slight chest pains with activity  09/2021 echo: LVEF 55-60%, grade I dd, AV mean gradient 29, AVA VTI 0.60, DI 0.18 SVI 27 - there is a mean gradient measured with the Pedhoff probe of 41 mmHg. The 2D Doppler images likely underestimate gradient based on angle of acquisition.  - ongoing DOE with  activities    2. HTN - compliant with meds   3. Carotid stenosis - 11/2020 carotid US: RICA 3-57%, LICA NL       The patient does not have symptoms concerning for COVID-19 infection (fever, chills, cough, or new shortness of breath).    Past Medical History:  Diagnosis Date   Acute respiratory failure with hypercapnia (HCC)    Allergy    Anxiety    Asthma    Bronchitis with bronchospasm    Cancer ([place])    skin   Depression    DM type 2 (diabetes mellitus, type 2) ([place])    FATTY LIVER DISEASE 08/05/2008   GERD (gastroesophageal reflux disease)    Hyperlipidemia    Hypertension    Hypothyroidism    Kidney stones    Leukocytosis    Obesity, Class III, BMI 40-49.9 (morbid obesity) ([place])    PERSONAL HX COLONIC POLYPS 09/01/2008   Seasonal allergies    TRANSAMINASES, SERUM, ELEVATED 08/05/2008   Past Surgical History:  Procedure Laterality Date   COLONOSCOPY  2013   Repeat 10 years   [place]   realignment   NOSE SURGERY  2007   sinus infections   POLYPECTOMY     skin cancer removal     TONSILLECTOMY       Current Meds  Medication Sig   albuterol (VENTOLIN HFA) 108 (90 Base) MCG/ACT inhaler INHALE TWO PUFFS INTO THE LUNGS EVERY 6  HOURS AS NEEDED FOR WHEEZING OR SHORTNESS OF BREATH (Patient taking differently: Inhale 2 puffs into the lungs every 6 (six) hours as needed for shortness of breath.)   aspirin EC 81 MG tablet Take 81 mg by mouth daily.   atorvastatin (LIPITOR) 20 MG tablet Take 1 tablet (20 mg total) by mouth daily.   cetirizine (ZYRTEC) 10 MG tablet Take 10 mg by mouth daily.   ciclopirox (PENLAC) 8 % solution Apply topically at bedtime. Apply over nail and surrounding skin. Apply daily over previous coat. After seven (7) days, may remove with alcohol and continue cycle.   citalopram (CELEXA) 40 MG tablet Take 1 tablet (40 mg total) by mouth daily.   Continuous Blood Gluc Receiver (FREESTYLE LIBRE 14 DAY READER) DEVI USE AS DIRECTED THREE  TIMES DAILY.   Continuous Blood Gluc Sensor (FREESTYLE LIBRE 14 DAY SENSOR) MISC USE AS DIRECTED THREE TIMES DAILY WITH SLIDING SCALE INSULIN USE.   diclofenac (VOLTAREN) 75 MG EC tablet TAKE ONE TABLET BY MOUTH TWICE daily PRN pain. TAKE WITH FOOD.   Fluticasone-Salmeterol (ADVAIR DISKUS) 250-50 MCG/DOSE AEPB Inhale 1 puff into the lungs 2 (two) times daily.   hydrochlorothiazide (HYDRODIURIL) 12.5 MG tablet Take 1 tablet (12.5 mg total) by mouth daily.   HYDROcodone bit-homatropine (HYCODAN) 5-1.5 MG/5ML syrup Take 5 mLs by mouth every 6 (six) hours as needed for cough.   insulin glargine (LANTUS) 100 unit/mL SOPN Inject 80 Units into the skin daily.   insulin lispro (HUMALOG) 100 UNIT/ML injection Inject 0.12-0.18 mLs (12-18 Units total) into the skin 3 (three) times daily with meals.   levothyroxine (SYNTHROID) 175 MCG tablet Take 1 tablet (175 mcg total) by mouth daily.   losartan (COZAAR) 50 MG tablet Take 1 tablet (50 mg total) by mouth daily. Needs labs.   metFORMIN (GLUCOPHAGE) 1000 MG tablet Take 1 tablet (1,000 mg total) by mouth 2 (two) times daily with a meal.   omeprazole (PRILOSEC) 40 MG capsule Take 1 capsule (40 mg total) by mouth daily.     Allergies:   Codeine and Sulfa antibiotics   Social History   Tobacco Use   Smoking status: Former    Packs/day: 0.50    Years: 5.00    Pack years: 2.50    Types: Cigarettes    Quit date: 10/23/1982    Years since quitting: 39.0   Smokeless tobacco: Never  Vaping Use   Vaping Use: Never used  Substance Use Topics   Alcohol use: No   Drug use: No     Family Hx: The patient's family history includes Allergies in her daughter; Breast cancer in her mother; Cancer in her mother; Diabetes in her mother; Heart attack in her maternal grandmother; Hypertension in her mother; Prostate cancer in her father; Thyroid disease in her mother. There is no history of Colon cancer, Esophageal cancer, Rectal cancer, Stomach cancer, or Colon  polyps.  ROS:   Please see the history of present illness.     All other systems reviewed and are negative.   Prior CV studies:   The following studies were reviewed today:    05/2020 echo IMPRESSIONS     1. Left ventricular ejection fraction, by estimation, is 65 to 70%. The  left ventricle has normal function. The left ventricle has no regional  wall motion abnormalities. There is mild left ventricular hypertrophy.  Left ventricular diastolic parameters  are consistent with Grade I diastolic dysfunction (impaired relaxation).   2. Right ventricular systolic function is normal.  The right ventricular  size is normal. There is normal pulmonary artery systolic pressure. The  estimated right ventricular systolic pressure is 67.3 mmHg.   3. The mitral valve is grossly normal. Trivial mitral valve  regurgitation.   4. The aortic valve is tricuspid, moderately calcified with decreased  cusp excursion. Aortic valve regurgitation is trivial. Moderate to severe  aortic valve stenosis. Aortic valve area, by VTI measures 0.77 cm. Aortic  valve mean gradient measures 29.5  mmHg. Aortic valve Vmax measures 3.64 m/s. Dimentionless index 0.30.   5. The inferior vena cava is normal in size with greater than 50%  respiratory variability, suggesting right atrial pressure of 3 mmHg.   11/2020 echo IMPRESSIONS     1. Left ventricular ejection fraction, by estimation, is 55 to 60%. The  left ventricle has normal function. The left ventricle has no regional  wall motion abnormalities. Left ventricular diastolic parameters are  consistent with Grade I diastolic  dysfunction (impaired relaxation). Elevated left atrial pressure.   2. Right ventricular systolic function is normal. The right ventricular  size is normal.   3. The mitral valve is normal in structure. No evidence of mitral valve  regurgitation. No evidence of mitral stenosis.   4. The aortic valve is tricuspid. There is mild  calcification of the  aortic valve. There is mild thickening of the aortic valve. Aortic valve  regurgitation is not visualized. Moderate to severe aortic valve stenosis.  Aortic valve mean gradient measures  29.6 mmHg. Aortic valve peak gradient measures 49.4 mmHg. Aortic valve  area, by VTI measures 0.92 cm.    09/2021 echo IMPRESSIONS     1. Left ventricular ejection fraction, by estimation, is 55 to 60%. The  left ventricle has normal function. The left ventricle has no regional  wall motion abnormalities. There is mild left ventricular hypertrophy.  Left ventricular diastolic parameters  are consistent with Grade I diastolic dysfunction (impaired relaxation).   2. Right ventricular systolic function is normal. The right ventricular  size is normal. There is normal pulmonary artery systolic pressure.   3. The mitral valve is abnormal. No evidence of mitral valve  regurgitation. No evidence of mitral stenosis. Moderate mitral annular  calcification.   4. Morphologically tri leaflet vavle heavily calcified. Gradients stable  since February 2022 but smaller LVOT diameter used DVI/AVA and peak  velocity from suprasternal notch ( 4 m/sec ) as well as morphology suggest  severe AS despite mean gradient of  28.7 mmHhg Suggest right and left cath to r/o CAD and further evaluate .  The aortic valve is tricuspid. There is severe calcifcation of the aortic  valve. There is severe thickening of the aortic valve. Aortic valve  regurgitation is trivial. Moderate to  severe aortic valve stenosis.   5. The inferior vena cava is normal in size with greater than 50%  respiratory variability, suggesting right atrial pressure of 3 mmHg.      Labs/Other Tests and Data Reviewed:    EKG:  No ECG reviewed.  Recent Labs: 04/18/2021: ALT 17; TSH 5.150 10/25/2021: B Natriuretic Peptide 77.0 10/26/2021: BUN 13; Creatinine, Ser 0.56; Hemoglobin 11.3; Platelets 204; Potassium 4.4; Sodium 137   Recent  Lipid Panel Lab Results  Component Value Date/Time   CHOL 165 04/18/2021 09:43 AM   TRIG 159 (H) 04/18/2021 09:43 AM   HDL 45 04/18/2021 09:43 AM   CHOLHDL 3.7 04/18/2021 09:43 AM   CHOLHDL 3.4 12/16/2019 09:05 AM   LDLCALC 92 04/18/2021 09:43  AM    Wt Readings from Last 3 Encounters:  11/10/21 195 lb (88.5 kg)  10/25/21 199 lb 11.8 oz (90.6 kg)  10/12/21 202 lb 13.2 oz (92 kg)     Risk Assessment/Calculations:          Objective:    Vital Signs:  Ht 5\' 5"  (1.651 m)    Wt 195 lb (88.5 kg)    BMI 32.45 kg/m    Normal affect. Normal speech pattern and tone. Comfortable, no apparent distress. No audible signs of sob or wheezing.   ASSESSMENT & PLAN:    1. Aortic stenosis 11/2020 echo: LVEF 55-60%, grade I dd, AV mean gradient 29, AVA VTI 0.60, DI 0.18 SVI 27 - there is a mean gradient measured with the Pedhoff probe of 41 mmHg (image 101). The 2D Doppler images likely underestimate gradient based on angle of acquisition.  - severe AS that is now symptomatic. Will arrange LHC/RHC with either Dr Burt Knack or Angelena Form and also for there evaluation considering AV replacement.            Shared Decision Making/Informed Consent The risks [stroke (1 in 1000), death (1 in 1000), kidney failure [usually temporary] (1 in 500), bleeding (1 in 200), allergic reaction [possibly serious] (1 in 200)], benefits (diagnostic support and management of coronary artery disease) and alternatives of a cardiac catheterization were discussed in detail with Ms. Burdett and she is willing to proceed.    COVID-19 Education: The signs and symptoms of COVID-19 were discussed with the patient and how to seek care for testing (follow up with PCP or arrange E-visit).  The importance of social distancing was discussed today.  Time:   Today, I have spent 18 minutes with the patient with telehealth technology discussing the above problems.     Medication Adjustments/Labs and Tests Ordered: Current medicines  are reviewed at length with the patient today.  Concerns regarding medicines are outlined above.   Tests Ordered: No orders of the defined types were placed in this encounter.   Medication Changes: No orders of the defined types were placed in this encounter.   Follow Up:  In Person 2 months  Signed, Carlyle Dolly, MD  11/10/2021 1:47 PM    [facility]

## 2021-11-10 NOTE — Patient Instructions (Addendum)
Medication Instructions:  Your physician recommends that you continue on your current medications as directed. Please refer to the Current Medication list given to you today.  *If you need a refill on your cardiac medications before your next appointment, please call your pharmacy*   Lab Work: CBC  BMET If you have labs (blood work) drawn today and your tests are completely normal, you will receive your results only by: [place] (if you have MyChart) OR A paper copy in the mail If you have any lab test that is abnormal or we need to change your treatment, we will call you to review the results.   Testing/Procedures: None   Follow-Up: At [facility], you and your health needs are our priority.  As part of our continuing mission to provide you with exceptional heart care, we have created designated Provider Care Teams.  These Care Teams include your primary Cardiologist (physician) and Advanced Practice Providers (APPs -  Physician Assistants and Nurse Practitioners) who all work together to provide you with the care you need, when you need it.  We recommend signing up for the patient portal called "MyChart".  Sign up information is provided on this After Visit Summary.  MyChart is used to connect with patients for Virtual Visits (Telemedicine).  Patients are able to view lab/test results, encounter notes, upcoming appointments, etc.  Non-urgent messages can be sent to your provider as well.   To learn more about what you can do with MyChart, go to [email or web address].    Your next appointment:   2 month(s)  The format for your next appointment:   In Person  Provider:   Carlyle Dolly, MD    Other Instructions  [place] [address] Dept: [phone] Loc: [place]  11/10/2021  You are scheduled for a Cardiac Catheterization on Thursday,  February 9 with Dr. Lauree Chandler.  1. Please arrive at the [facility] (Main Entrance A) at [facility]: [address] [place], [place] [address] at 5:30 AM (This time is two hours before your procedure to ensure your preparation). Free valet parking service is available.   Special note: Every effort is made to have your procedure done on time. Please understand that emergencies sometimes delay scheduled procedures.  2. Diet: Do not eat solid foods after midnight.  The patient may have clear liquids until 5am upon the day of the procedure.  3. Labs: You will need to have blood drawn on Tuesday, February 7 at [place] do not need to be fasting.  4. Medication instructions in preparation for your procedure:   Contrast Allergy: No  DO NOT TAKE HYDROCHLOROTHIAZIDE THE MORNING OF YOUR PROCEDURE.   Take half of your insulin dose the night before your procedure. Do not take any insulin on the day of the procedure.  Do not take Diabetes Med Glucophage (Metformin) on the day of the procedure and HOLD 48 HOURS AFTER THE PROCEDURE.  On the morning of your procedure, take your Aspirin and any morning medicines NOT listed above.  You may use sips of water.  5. Plan for one night stay--bring personal belongings. 6. Bring a current list of your medications and current insurance cards. 7. You MUST have a responsible person to drive you home. 8. Someone MUST be with you the first 24 hours after you arrive home or your discharge will be delayed. 9. Please wear  clothes that are easy to get on and off and wear slip-on shoes.  Thank you for allowing [place] to care for you!   -- [place] [facility]

## 2021-11-17 ENCOUNTER — Ambulatory Visit (INDEPENDENT_AMBULATORY_CARE_PROVIDER_SITE_OTHER): Payer: Medicaid Other | Admitting: Nurse Practitioner

## 2021-11-17 ENCOUNTER — Encounter: Payer: Self-pay | Admitting: Nurse Practitioner

## 2021-11-17 ENCOUNTER — Other Ambulatory Visit: Payer: Self-pay

## 2021-11-17 VITALS — BP 114/71 | HR 92 | Ht 65.0 in | Wt 202.8 lb

## 2021-11-17 DIAGNOSIS — E039 Hypothyroidism, unspecified: Secondary | ICD-10-CM

## 2021-11-17 DIAGNOSIS — Z794 Long term (current) use of insulin: Secondary | ICD-10-CM

## 2021-11-17 DIAGNOSIS — I10 Essential (primary) hypertension: Secondary | ICD-10-CM | POA: Diagnosis not present

## 2021-11-17 DIAGNOSIS — E119 Type 2 diabetes mellitus without complications: Secondary | ICD-10-CM | POA: Diagnosis not present

## 2021-11-17 DIAGNOSIS — E782 Mixed hyperlipidemia: Secondary | ICD-10-CM

## 2021-11-17 NOTE — Patient Instructions (Signed)

## 2021-11-17 NOTE — Progress Notes (Signed)
Endocrinology Follow Up Note       11/17/2021, 9:34 AM   Subjective:    Patient ID: Sarah Pope, female    DOB: [birth year].  Sarah Pope is being seen in follow up after being seen in consultation for management of currently uncontrolled symptomatic diabetes requested by  [place] Nation, MD.   Past Medical History:  Diagnosis Date   Acute respiratory failure with hypercapnia ([place])    Allergy    Anxiety    Asthma    Bronchitis with bronchospasm    Cancer ([place])    skin   Depression    DM type 2 (diabetes mellitus, type 2) ([place])    FATTY LIVER DISEASE 08/05/2008   GERD (gastroesophageal reflux disease)    Hyperlipidemia    Hypertension    Hypothyroidism    Kidney stones    Leukocytosis    Obesity, Class III, BMI 40-49.9 (morbid obesity) ([place])    PERSONAL HX COLONIC POLYPS 09/01/2008   Seasonal allergies    TRANSAMINASES, SERUM, ELEVATED 08/05/2008    Past Surgical History:  Procedure Laterality Date   COLONOSCOPY  2013   Repeat 10 years   [place]   realignment   NOSE SURGERY  2007   sinus infections   POLYPECTOMY     skin cancer removal     TONSILLECTOMY      Social History   Socioeconomic History   Marital status: Divorced    Spouse name: Not on file   Number of children: Not on file   Years of education: Not on file   Highest education level: Not on file  Occupational History   Occupation: Lead Clerk at [employer]   Smoking status: Former    Packs/day: 0.50    Years: 5.00    Pack years: 2.50    Types: Cigarettes    Quit date: 10/23/1982    Years since quitting: 39.0   Smokeless tobacco: Never  Vaping Use   Vaping Use: Never used  Substance and Sexual Activity   Alcohol use: No   Drug use: No   Sexual activity: Not on file  Other Topics Concern   Not on file  Social History Narrative   Not on file   Social Determinants of Health    Financial Resource Strain: Not on file  Food Insecurity: Not on file  Transportation Needs: Not on file  Physical Activity: Not on file  Stress: Not on file  Social Connections: Not on file    Family History  Problem Relation Age of Onset   Cancer Mother    Hypertension Mother    Thyroid disease Mother    Breast cancer Mother    Diabetes Mother    Prostate cancer Father    Heart attack Maternal Grandmother    Allergies Daughter    Colon cancer Neg Hx    Esophageal cancer Neg Hx    Rectal cancer Neg Hx    Stomach cancer Neg Hx    Colon polyps Neg Hx     Outpatient Encounter Medications as of 11/17/2021  Medication Sig   albuterol (VENTOLIN HFA) 108 (90 Base) MCG/ACT inhaler INHALE  TWO PUFFS INTO THE LUNGS EVERY 6 HOURS AS NEEDED FOR WHEEZING OR SHORTNESS OF BREATH (Patient taking differently: Inhale 2 puffs into the lungs every 6 (six) hours as needed for shortness of breath.)   aspirin EC 81 MG tablet Take 81 mg by mouth daily.   atorvastatin (LIPITOR) 20 MG tablet Take 1 tablet (20 mg total) by mouth daily.   cetirizine (ZYRTEC) 10 MG tablet Take 10 mg by mouth daily.   ciclopirox (PENLAC) 8 % solution Apply topically at bedtime. Apply over nail and surrounding skin. Apply daily over previous coat. After seven (7) days, may remove with alcohol and continue cycle.   citalopram (CELEXA) 40 MG tablet Take 1 tablet (40 mg total) by mouth daily.   Continuous Blood Gluc Receiver (FREESTYLE LIBRE 14 DAY READER) DEVI USE AS DIRECTED THREE TIMES DAILY.   Continuous Blood Gluc Sensor (FREESTYLE LIBRE 14 DAY SENSOR) MISC USE AS DIRECTED THREE TIMES DAILY WITH SLIDING SCALE INSULIN USE.   diclofenac (VOLTAREN) 75 MG EC tablet TAKE ONE TABLET BY MOUTH TWICE daily PRN pain. TAKE WITH FOOD.   Fluticasone-Salmeterol (ADVAIR DISKUS) 250-50 MCG/DOSE AEPB Inhale 1 puff into the lungs 2 (two) times daily.   hydrochlorothiazide (HYDRODIURIL) 12.5 MG tablet Take 1 tablet (12.5 mg total) by mouth  daily.   HYDROcodone bit-homatropine (HYCODAN) 5-1.5 MG/5ML syrup Take 5 mLs by mouth every 6 (six) hours as needed for cough.   insulin glargine (LANTUS) 100 unit/mL SOPN Inject 80 Units into the skin daily.   insulin lispro (HUMALOG) 100 UNIT/ML injection Inject 0.12-0.18 mLs (12-18 Units total) into the skin 3 (three) times daily with meals.   levothyroxine (SYNTHROID) 175 MCG tablet Take 1 tablet (175 mcg total) by mouth daily.   losartan (COZAAR) 50 MG tablet Take 1 tablet (50 mg total) by mouth daily. Needs labs.   metFORMIN (GLUCOPHAGE) 1000 MG tablet Take 1 tablet (1,000 mg total) by mouth 2 (two) times daily with a meal.   omeprazole (PRILOSEC) 40 MG capsule Take 1 capsule (40 mg total) by mouth daily.   Facility-Administered Encounter Medications as of 11/17/2021  Medication   sodium chloride flush (NS) 0.9 % injection 3 mL    ALLERGIES: Allergies  Allergen Reactions   Codeine Nausea Only   Sulfa Antibiotics     itching    VACCINATION STATUS: Immunization History  Administered Date(s) Administered   Influenza Split 07/03/2012, 09/10/2013   Influenza,inj,Quad PF,6+ Mos 09/29/2015, 06/27/2016, 08/15/2017, 08/28/2018, 07/14/2019, 08/09/2020   Influenza-Unspecified 08/24/2011, 08/11/2020   Moderna Sars-Covid-2 Vaccination 04/05/2020, 04/28/2020   Pneumococcal Polysaccharide-23 08/23/2004, 08/28/2018   Td 06/17/2008   Tdap 05/09/2019   Zoster Recombinat (Shingrix) 10/14/2019, 08/04/2020, 08/11/2020    Diabetes She presents for her follow-up diabetic visit. She has type 2 diabetes mellitus. Onset time: Diagnosed at approx age of 43. Her disease course has been improving. There are no hypoglycemic associated symptoms. Associated symptoms include fatigue and foot paresthesias. Pertinent negatives for diabetes include no polydipsia, no polyuria and no weight loss. There are no hypoglycemic complications. Symptoms are improving. Diabetic complications include nephropathy and  peripheral neuropathy. Risk factors for coronary artery disease include stress, diabetes mellitus, dyslipidemia, family history, obesity, hypertension, post-menopausal and sedentary lifestyle. Current diabetic treatment includes intensive insulin program and oral agent (monotherapy). She is compliant with treatment most of the time. Her weight is increasing steadily. She is following a generally unhealthy diet. When asked about meal planning, she reported none. She has not had a previous visit with a dietitian.  She rarely participates in exercise. Her home blood glucose trend is decreasing steadily. Her breakfast blood glucose range is generally 140-180 mg/dl. Her lunch blood glucose range is generally 180-200 mg/dl. Her dinner blood glucose range is generally >200 mg/dl. Her bedtime blood glucose range is generally >200 mg/dl. Her overall blood glucose range is 180-200 mg/dl. (She presents today with her CGM, no logs, showing improving glycemic profile overall.  She still has significant postprandial hyperglycemia.  Her most recent A1c on 10/25/21 was 9%, improving from last visit of 10.2%.  Analysis of her CGM shows TIR 44%, TAR 56%, TBR 0% with a GMI of 8%.  She reports she will be having a stent placed next week.  She also reports when she was in the hospital recently she did receive steroids which caused her numbers to be higher than usual.) An ACE inhibitor/angiotensin II receptor blocker is being taken. She sees a podiatrist.Eye exam is not current.  Hyperlipidemia This is a chronic problem. The current episode started more than 1 year ago. The problem is uncontrolled. Recent lipid tests were reviewed and are variable. Exacerbating diseases include chronic renal disease, diabetes, hypothyroidism and obesity. Factors aggravating her hyperlipidemia include thiazides and fatty foods. Current antihyperlipidemic treatment includes statins. The current treatment provides moderate improvement of lipids. Compliance  problems include adherence to diet and adherence to exercise.  Risk factors for coronary artery disease include diabetes mellitus, dyslipidemia, family history, hypertension, obesity, post-menopausal, a sedentary lifestyle and stress.  Hypertension This is a chronic problem. The current episode started more than 1 year ago. The problem has been resolved since onset. The problem is controlled. Agents associated with hypertension include thyroid hormones. Risk factors for coronary artery disease include diabetes mellitus, dyslipidemia, family history, post-menopausal state, sedentary lifestyle, stress and obesity. Past treatments include angiotensin blockers and diuretics. The current treatment provides significant improvement. Compliance problems include exercise and diet.  Hypertensive end-organ damage includes kidney disease. Identifiable causes of hypertension include chronic renal disease and sleep apnea.    Review of systems  Constitutional: + steadily increasing body weight, current Body mass index is 33.75 kg/m., + fatigue, no subjective hyperthermia, no subjective hypothermia Eyes: no blurry vision, no xerophthalmia ENT: no sore throat, no nodules palpated in throat, no dysphagia/odynophagia, no hoarseness Cardiovascular: no chest pain, no shortness of breath, no palpitations, + intermittent leg swelling Respiratory: no cough, no shortness of breath Gastrointestinal: no nausea/vomiting/diarrhea Musculoskeletal: no muscle/joint aches Skin: no rashes, no hyperemia Neurological: + resting tremors, + numbness/tingling/burning pain to bilateral feet, no dizziness Psychiatric: no depression, no anxiety  Objective:     BP 114/71    Pulse 92    Ht 5\' 5"  (1.651 m)    Wt 202 lb 12.8 oz (92 kg)    SpO2 96%    BMI 33.75 kg/m   Wt Readings from Last 3 Encounters:  11/17/21 202 lb 12.8 oz (92 kg)  11/10/21 195 lb (88.5 kg)  10/25/21 199 lb 11.8 oz (90.6 kg)     BP Readings from Last 3  Encounters:  11/17/21 114/71  10/28/21 (!) 127/52  10/12/21 (!) 136/48     Physical Exam- Limited  Constitutional:  Body mass index is 33.75 kg/m. , not in acute distress, normal state of mind Eyes:  EOMI, no exophthalmos Neck: Supple Cardiovascular: RRR, + murmur, rubs, or gallops, nonpitting edema to bilateral feet Respiratory: Adequate breathing efforts, no crackles, rales, rhonchi, or wheezing Musculoskeletal: no gross deformities, strength intact in all four extremities, no  gross restriction of joint movements Skin:  no rashes, no hyperemia Neurological: mild resting tremor     CMP ( most recent) CMP     Component Value Date/Time   NA 137 10/26/2021 0632   NA 141 04/18/2021 0943   K 4.4 10/26/2021 0632   CL 103 10/26/2021 0632   CO2 28 10/26/2021 0632   GLUCOSE 227 (H) 10/26/2021 0632   BUN 13 10/26/2021 0632   BUN 14 04/18/2021 0943   CREATININE 0.56 10/26/2021 0632   CREATININE 0.64 02/05/2013 1148   CALCIUM 8.4 (L) 10/26/2021 0632   PROT 6.8 04/18/2021 0943   ALBUMIN 4.4 04/18/2021 0943   AST 13 04/18/2021 0943   ALT 17 04/18/2021 0943   ALKPHOS 115 04/18/2021 0943   BILITOT 0.8 04/18/2021 0943   GFRNONAA >60 10/26/2021 0632   GFRAA >60 05/31/2020 0930     Diabetic Labs (most recent): Lab Results  Component Value Date   HGBA1C 9.0 (H) 10/25/2021   HGBA1C 10.2 (A) 08/01/2021   HGBA1C 9.4 (H) 04/18/2021     Lipid Panel ( most recent) Lipid Panel     Component Value Date/Time   CHOL 165 04/18/2021 0943   TRIG 159 (H) 04/18/2021 0943   HDL 45 04/18/2021 0943   CHOLHDL 3.7 04/18/2021 0943   CHOLHDL 3.4 12/16/2019 0905   VLDL 22 12/16/2019 0905   LDLCALC 92 04/18/2021 0943   LABVLDL 28 04/18/2021 0943      Lab Results  Component Value Date   TSH 5.150 (H) 04/18/2021   TSH 1.503 12/16/2019   TSH 1.360 07/01/2019   TSH 2.480 04/01/2018   TSH 2.650 10/03/2016   TSH 3.620 10/06/2015   TSH 4.248 02/05/2013   TSH 3.189 06/30/2012            Assessment & Plan:   1) Type 2 diabetes mellitus without complication, with long-term current use of insulin ([place])  She presents today with her CGM, no logs, showing improving glycemic profile overall.  She still has significant postprandial hyperglycemia.  Her most recent A1c on 10/25/21 was 9%, improving from last visit of 10.2%.  Analysis of her CGM shows TIR 44%, TAR 56%, TBR 0% with a GMI of 8%.  She reports she will be having a stent placed next week.  She also reports when she was in the hospital recently she did receive steroids which caused her numbers to be higher than usual.  - Dianne H Jaycox has currently uncontrolled symptomatic type 2 DM since 66 years of age.  -Recent labs reviewed.  - I had a long discussion with her about the progressive nature of diabetes and the pathology behind its complications. -her diabetes is complicated by mild CKD, neuropathy and she remains at a high risk for more acute and chronic complications which include CAD, CVA, CKD, retinopathy, and neuropathy. These are all discussed in detail with her.  - Nutritional counseling repeated at each appointment due to patients tendency to fall back in to old habits.  - The patient admits there is a room for improvement in their diet and drink choices. -  Suggestion is made for the patient to avoid simple carbohydrates from their diet including Cakes, Sweet Desserts / Pastries, Ice Cream, Soda (diet and regular), Sweet Tea, Candies, Chips, Cookies, Sweet Pastries, Store Bought Juices, Alcohol in Excess of 1-2 drinks a day, Artificial Sweeteners, Coffee Creamer, and "Sugar-free" Products. This will help patient to have stable blood glucose profile and potentially avoid unintended weight gain.   -  I encouraged the patient to switch to unprocessed or minimally processed complex starch and increased protein intake (animal or plant source), fruits, and vegetables.   - Patient is advised to stick to a routine  mealtimes to eat 3 meals a day and avoid unnecessary snacks (to snack only to correct hypoglycemia).  - I have approached her with the following individualized plan to manage her diabetes and patient agrees:   -Based on her improving glycemic profile, no changes will be made to her medication regimen today.  She is advised to continue Lantus 80 units SQ nightly and continue her Humalog 12-18 units TID with meals if glucose is above 90 and she is eating (Specific instructions on how to titrate insulin dosage based on glucose readings given to patient in writing).  She can also continue Metformin 1000 mg po twice daily with meals.  -she is encouraged to continue using her CGM to monitor glucose 4 times daily, before meals and before bed, and to call the clinic if she has readings less than 70 or above 300 for 3 tests in a row.  - she is warned not to take insulin without proper monitoring per orders. - Adjustment parameters are given to her for hypo and hyperglycemia in writing.  -She has allergy to sulfa meds.  - she will be considered for incretin therapy as appropriate next visit.  - Specific targets for  A1c; LDL, HDL, and Triglycerides were discussed with the patient.  2) Blood Pressure /Hypertension:  her blood pressure is controlled to target.   she is advised to continue her current medications including HCTZ 12.5 mg po daily and Losartan 50 mg po daily.  3) Lipids/Hyperlipidemia:    Review of her recent lipid panel from 04/18/21 showed controlled LDL at 92 and slightly elevated triglycerides of 159 .  she is advised to continue Lipitor 20 mg daily at bedtime.  Side effects and precautions discussed with her.  4)  Weight/Diet:  her Body mass index is 33.75 kg/m.  -  clearly complicating her diabetes care.   she is a candidate for weight loss. I discussed with her the fact that loss of 5 - 10% of her  current body weight will have the most impact on her diabetes management.  Exercise,  and detailed carbohydrates information provided  -  detailed on discharge instructions.  5) Chronic Care/Health Maintenance: -she is on ACEI/ARB and Statin medications and is encouraged to initiate and continue to follow up with Ophthalmology, Dentist, Podiatrist at least yearly or according to recommendations, and advised to stay away from smoking. I have recommended yearly flu vaccine and pneumonia vaccine at least every 5 years; moderate intensity exercise for up to 150 minutes weekly; and sleep for at least 7 hours a day.  - she is advised to maintain close follow up with [place] Nation, MD for primary care needs, as well as her other providers for optimal and coordinated care.      I spent 30 minutes in the care of the patient today including review of labs from [place], Lipids, Thyroid Function, Hematology (current and previous including abstractions from other facilities); face-to-face time discussing  her blood glucose readings/logs, discussing hypoglycemia and hyperglycemia episodes and symptoms, medications doses, her options of short and long term treatment based on the latest standards of care / guidelines;  discussion about incorporating lifestyle medicine;  and documenting the encounter.    Please refer to Patient Instructions for Blood Glucose Monitoring and Insulin/Medications Dosing  Guide"  in media tab for additional information. Please  also refer to " Patient Self Inventory" in the Media  tab for reviewed elements of pertinent patient history.  August Albino participated in the discussions, expressed understanding, and voiced agreement with the above plans.  All questions were answered to her satisfaction. she is encouraged to contact clinic should she have any questions or concerns prior to her return visit.     Follow up plan: - Return in about 3 months (around 02/15/2022) for Diabetes F/U with A1c in office, No previsit labs, Bring meter and logs.    Rayetta Pigg,  [facility] [address] [place], [place] [address] Phone: [phone] Fax: [phone]  11/17/2021, 9:34 AM

## 2021-11-23 DIAGNOSIS — I2510 Atherosclerotic heart disease of native coronary artery without angina pectoris: Secondary | ICD-10-CM | POA: Diagnosis not present

## 2021-11-23 DIAGNOSIS — E1165 Type 2 diabetes mellitus with hyperglycemia: Secondary | ICD-10-CM | POA: Diagnosis not present

## 2021-11-23 DIAGNOSIS — J45909 Unspecified asthma, uncomplicated: Secondary | ICD-10-CM | POA: Diagnosis not present

## 2021-11-23 DIAGNOSIS — J329 Chronic sinusitis, unspecified: Secondary | ICD-10-CM | POA: Diagnosis not present

## 2021-11-23 DIAGNOSIS — Z6833 Body mass index (BMI) 33.0-33.9, adult: Secondary | ICD-10-CM | POA: Diagnosis not present

## 2021-11-27 DIAGNOSIS — E1165 Type 2 diabetes mellitus with hyperglycemia: Secondary | ICD-10-CM | POA: Diagnosis not present

## 2021-11-27 DIAGNOSIS — J45909 Unspecified asthma, uncomplicated: Secondary | ICD-10-CM | POA: Diagnosis not present

## 2021-11-27 DIAGNOSIS — Z6833 Body mass index (BMI) 33.0-33.9, adult: Secondary | ICD-10-CM | POA: Diagnosis not present

## 2021-11-27 DIAGNOSIS — I2510 Atherosclerotic heart disease of native coronary artery without angina pectoris: Secondary | ICD-10-CM | POA: Diagnosis not present

## 2021-11-29 ENCOUNTER — Other Ambulatory Visit (HOSPITAL_COMMUNITY)
Admission: RE | Admit: 2021-11-29 | Discharge: 2021-11-29 | Disposition: A | Discharge status: Home / Self Care | Payer: HMO | Source: Ambulatory Visit | Attending: Cardiology | Admitting: Cardiology

## 2021-11-29 ENCOUNTER — Other Ambulatory Visit: Payer: Self-pay

## 2021-11-29 DIAGNOSIS — I350 Nonrheumatic aortic (valve) stenosis: Secondary | ICD-10-CM | POA: Diagnosis not present

## 2021-11-29 LAB — CBC
HCT: 39.3 % (ref 36.0–46.0)
Hemoglobin: 12.1 g/dL (ref 12.0–15.0)
MCH: 25 pg — ABNORMAL LOW (ref 26.0–34.0)
MCHC: 30.8 g/dL (ref 30.0–36.0)
MCV: 81.2 fL (ref 80.0–100.0)
Platelets: 277 10*3/uL (ref 150–400)
RBC: 4.84 MIL/uL (ref 3.87–5.11)
RDW: 13 % (ref 11.5–15.5)
WBC: 7.4 10*3/uL (ref 4.0–10.5)
nRBC: 0 % (ref 0.0–0.2)

## 2021-11-29 LAB — BASIC METABOLIC PANEL
Anion gap: 10 (ref 5–15)
BUN: 15 mg/dL (ref 8–23)
CO2: 26 mmol/L (ref 22–32)
Calcium: 9 mg/dL (ref 8.9–10.3)
Chloride: 99 mmol/L (ref 98–111)
Creatinine, Ser: 0.59 mg/dL (ref 0.44–1.00)
GFR, Estimated: >60 mL/min (ref >60)
Glucose, Bld: 188 mg/dL — ABNORMAL HIGH (ref 70–99)
Potassium: 4 mmol/L (ref 3.5–5.1)
Sodium: 135 mmol/L (ref 135–145)

## 2021-11-29 MED ORDER — HEPARIN (PORCINE) IN NACL 1000-0.9 UT/500ML-% IV SOLN
INTRAVENOUS | Status: AC
  Filled 2021-11-29: qty 1000

## 2021-11-29 MED ORDER — FENTANYL CITRATE (PF) 100 MCG/2ML IJ SOLN
INTRAMUSCULAR | Status: AC
  Filled 2021-11-29: qty 2

## 2021-11-29 MED ORDER — MIDAZOLAM HCL 2 MG/2ML IJ SOLN
INTRAMUSCULAR | Status: AC
  Filled 2021-11-29: qty 2

## 2021-11-29 MED ORDER — HEPARIN SODIUM (PORCINE) 1000 UNIT/ML IJ SOLN
INTRAMUSCULAR | Status: AC
  Filled 2021-11-29: qty 10

## 2021-11-29 MED ORDER — VERAPAMIL HCL 2.5 MG/ML IV SOLN
INTRAVENOUS | Status: AC
  Filled 2021-11-29: qty 2

## 2021-11-29 MED ORDER — LIDOCAINE HCL (PF) 1 % IJ SOLN
INTRAMUSCULAR | Status: AC
  Filled 2021-11-29: qty 30

## 2021-11-30 ENCOUNTER — Telehealth: Payer: Self-pay | Admitting: *Deleted

## 2021-11-30 NOTE — Telephone Encounter (Signed)
Cardiac catheterization scheduled at [facility] for: Thursday December 01, 2021 7:30 [facility] Main Entrance [facility]) at: 5:30 AM   Diet-no solid food after midnight prior to cath, clear liquids until 5 AM day of procedure.  Medication instructions for procedure: -Hold:  HCTZ-AM of procedure   Metformin-day of procedure and 48 hours post procedure  Insulin-AM of procedure 1/2 usual Insulin HS prior to procedure -Except hold medications usual morning medications can be taken pre-cath with sips of water including aspirin 81 mg.    Must have responsible adult to drive home post procedure and be with patient first 24 hours after arriving home.  [facility] does allow one visitor to accompany you and wait in the hospital waiting room while you are there for your procedure. You and your visitor will be asked to wear a mask once you enter the hospital.   Patient reports does not currently have any new symptoms concerning for COVID-19 and no household members with COVID-19 like illness.        Reviewed procedure instructions with patient.

## 2021-12-01 ENCOUNTER — Other Ambulatory Visit: Payer: Self-pay | Admitting: Cardiology

## 2021-12-01 ENCOUNTER — Encounter (HOSPITAL_COMMUNITY)
Admission: RE | Disposition: A | Discharge status: Home / Self Care | Payer: Self-pay | Source: Home / Self Care | Attending: Cardiovascular Disease

## 2021-12-01 ENCOUNTER — Encounter (HOSPITAL_COMMUNITY): Payer: Self-pay | Admitting: Cardiovascular Disease

## 2021-12-01 ENCOUNTER — Other Ambulatory Visit: Payer: Self-pay

## 2021-12-01 ENCOUNTER — Encounter: Payer: Self-pay | Admitting: Cardiology

## 2021-12-01 ENCOUNTER — Ambulatory Visit (HOSPITAL_COMMUNITY)
Admission: RE | Admit: 2021-12-01 | Discharge: 2021-12-01 | Disposition: A | Discharge status: Home / Self Care | Payer: HMO | Attending: Cardiovascular Disease | Admitting: Cardiovascular Disease

## 2021-12-01 ENCOUNTER — Other Ambulatory Visit (HOSPITAL_BASED_OUTPATIENT_CLINIC_OR_DEPARTMENT_OTHER): Payer: Self-pay

## 2021-12-01 DIAGNOSIS — I350 Nonrheumatic aortic (valve) stenosis: Secondary | ICD-10-CM | POA: Diagnosis not present

## 2021-12-01 DIAGNOSIS — Z794 Long term (current) use of insulin: Secondary | ICD-10-CM | POA: Insufficient documentation

## 2021-12-01 DIAGNOSIS — I6521 Occlusion and stenosis of right carotid artery: Secondary | ICD-10-CM | POA: Diagnosis not present

## 2021-12-01 DIAGNOSIS — I080 Rheumatic disorders of both mitral and aortic valves: Secondary | ICD-10-CM | POA: Insufficient documentation

## 2021-12-01 DIAGNOSIS — E119 Type 2 diabetes mellitus without complications: Secondary | ICD-10-CM | POA: Diagnosis not present

## 2021-12-01 DIAGNOSIS — I10 Essential (primary) hypertension: Secondary | ICD-10-CM | POA: Insufficient documentation

## 2021-12-01 DIAGNOSIS — Z87891 Personal history of nicotine dependence: Secondary | ICD-10-CM | POA: Diagnosis not present

## 2021-12-01 DIAGNOSIS — Z7984 Long term (current) use of oral hypoglycemic drugs: Secondary | ICD-10-CM | POA: Diagnosis not present

## 2021-12-01 DIAGNOSIS — G4710 Hypersomnia, unspecified: Secondary | ICD-10-CM

## 2021-12-01 DIAGNOSIS — R5383 Other fatigue: Secondary | ICD-10-CM

## 2021-12-01 DIAGNOSIS — R0683 Snoring: Secondary | ICD-10-CM

## 2021-12-01 HISTORY — PX: RIGHT HEART CATH AND CORONARY ANGIOGRAPHY: CATH118264

## 2021-12-01 LAB — POCT I-STAT EG7
Acid-Base Excess: 1 mmol/L (ref 0.0–2.0)
Bicarbonate: 28.3 mmol/L — ABNORMAL HIGH (ref 20.0–28.0)
Calcium, Ion: 1.28 mmol/L (ref 1.15–1.40)
HCT: 34 % — ABNORMAL LOW (ref 36.0–46.0)
Hemoglobin: 11.6 g/dL — ABNORMAL LOW (ref 12.0–15.0)
O2 Saturation: 76 %
Potassium: 3.9 mmol/L (ref 3.5–5.1)
Sodium: 141 mmol/L (ref 135–145)
TCO2: 30 mmol/L (ref 22–32)
pCO2, Ven: 55 mmHg (ref 44.0–60.0)
pH, Ven: 7.32 (ref 7.250–7.430)
pO2, Ven: 45 mmHg (ref 32.0–45.0)

## 2021-12-01 LAB — POCT I-STAT 7, (LYTES, BLD GAS, ICA,H+H)
Acid-Base Excess: 0 mmol/L (ref 0.0–2.0)
Bicarbonate: 26.4 mmol/L (ref 20.0–28.0)
Calcium, Ion: 1.17 mmol/L (ref 1.15–1.40)
HCT: 32 % — ABNORMAL LOW (ref 36.0–46.0)
Hemoglobin: 10.9 g/dL — ABNORMAL LOW (ref 12.0–15.0)
O2 Saturation: 99 %
Potassium: 3.7 mmol/L (ref 3.5–5.1)
Sodium: 144 mmol/L (ref 135–145)
TCO2: 28 mmol/L (ref 22–32)
pCO2 arterial: 48.7 mmHg — ABNORMAL HIGH (ref 32.0–48.0)
pH, Arterial: 7.342 — ABNORMAL LOW (ref 7.350–7.450)
pO2, Arterial: 140 mmHg — ABNORMAL HIGH (ref 83.0–108.0)

## 2021-12-01 LAB — GLUCOSE, CAPILLARY
Glucose-Capillary: 126 mg/dL — ABNORMAL HIGH (ref 70–99)
Glucose-Capillary: 136 mg/dL — ABNORMAL HIGH (ref 70–99)

## 2021-12-01 LAB — POCT ACTIVATED CLOTTING TIME: Activated Clotting Time: 179 seconds

## 2021-12-01 SURGERY — RIGHT HEART CATH AND CORONARY ANGIOGRAPHY
Anesthesia: LOCAL

## 2021-12-01 MED ORDER — HYDRALAZINE HCL 20 MG/ML IJ SOLN: 10.0000 mg | INTRAMUSCULAR | Status: DC | PRN

## 2021-12-01 MED ORDER — FENTANYL CITRATE (PF) 100 MCG/2ML IJ SOLN
INTRAMUSCULAR | Status: AC
  Filled 2021-12-01: qty 2

## 2021-12-01 MED ORDER — LIDOCAINE HCL (PF) 1 % IJ SOLN
INTRAMUSCULAR | Status: AC
  Filled 2021-12-01: qty 30

## 2021-12-01 MED ORDER — SODIUM CHLORIDE 0.9% FLUSH: 3.0000 mL | INTRAVENOUS | Status: DC | PRN

## 2021-12-01 MED ORDER — VERAPAMIL HCL 2.5 MG/ML IV SOLN
INTRAVENOUS | Status: AC
  Filled 2021-12-01: qty 2

## 2021-12-01 MED ORDER — HEPARIN (PORCINE) IN NACL 1000-0.9 UT/500ML-% IV SOLN
INTRAVENOUS | Status: DC | PRN
  Administered 2021-12-01 (×2): 500 mL

## 2021-12-01 MED ORDER — SODIUM CHLORIDE 0.9 % WEIGHT BASED INFUSION
3.0000 mL/kg/h | INTRAVENOUS | Status: AC
  Administered 2021-12-01: 3 mL/kg/h via INTRAVENOUS

## 2021-12-01 MED ORDER — MIDAZOLAM HCL 2 MG/2ML IJ SOLN
INTRAMUSCULAR | Status: AC
  Filled 2021-12-01: qty 2

## 2021-12-01 MED ORDER — SODIUM CHLORIDE 0.9 % WEIGHT BASED INFUSION: 1.0000 mL/kg/h | INTRAVENOUS | Status: DC

## 2021-12-01 MED ORDER — VERAPAMIL HCL 2.5 MG/ML IV SOLN
INTRAVENOUS | Status: DC | PRN
  Administered 2021-12-01: 10 mL via INTRA_ARTERIAL

## 2021-12-01 MED ORDER — FENTANYL CITRATE (PF) 100 MCG/2ML IJ SOLN
INTRAMUSCULAR | Status: DC | PRN
  Administered 2021-12-01: 50 ug via INTRAVENOUS

## 2021-12-01 MED ORDER — ACETAMINOPHEN 325 MG PO TABS: 650.0000 mg | ORAL_TABLET | ORAL | Status: DC | PRN

## 2021-12-01 MED ORDER — LABETALOL HCL 5 MG/ML IV SOLN: 10.0000 mg | INTRAVENOUS | Status: DC | PRN

## 2021-12-01 MED ORDER — HEPARIN (PORCINE) IN NACL 1000-0.9 UT/500ML-% IV SOLN
INTRAVENOUS | Status: AC
  Filled 2021-12-01: qty 1000

## 2021-12-01 MED ORDER — SODIUM CHLORIDE 0.9% FLUSH: 3.0000 mL | Freq: Two times a day (BID) | INTRAVENOUS | Status: DC

## 2021-12-01 MED ORDER — IOHEXOL 350 MG/ML SOLN
INTRAVENOUS | Status: DC | PRN
  Administered 2021-12-01: 45 mL

## 2021-12-01 MED ORDER — HEPARIN SODIUM (PORCINE) 1000 UNIT/ML IJ SOLN
INTRAMUSCULAR | Status: DC | PRN
  Administered 2021-12-01: 4500 [IU] via INTRAVENOUS

## 2021-12-01 MED ORDER — LIDOCAINE HCL (PF) 1 % IJ SOLN
INTRAMUSCULAR | Status: DC | PRN
  Administered 2021-12-01: 15 mL
  Administered 2021-12-01 (×2): 2 mL

## 2021-12-01 MED ORDER — ASPIRIN 81 MG PO CHEW: 81.0000 mg | CHEWABLE_TABLET | ORAL | Status: DC

## 2021-12-01 MED ORDER — ONDANSETRON HCL 4 MG/2ML IJ SOLN: 4.0000 mg | Freq: Four times a day (QID) | INTRAMUSCULAR | Status: DC | PRN

## 2021-12-01 MED ORDER — SODIUM CHLORIDE 0.9 % IV SOLN: 250.0000 mL | INTRAVENOUS | Status: DC | PRN

## 2021-12-01 MED ORDER — MIDAZOLAM HCL 2 MG/2ML IJ SOLN
INTRAMUSCULAR | Status: DC | PRN
  Administered 2021-12-01: 2 mg via INTRAVENOUS

## 2021-12-01 MED ORDER — HEPARIN SODIUM (PORCINE) 1000 UNIT/ML IJ SOLN
INTRAMUSCULAR | Status: AC
  Filled 2021-12-01: qty 10

## 2021-12-01 MED ORDER — SODIUM CHLORIDE 0.9 % IV SOLN: INTRAVENOUS | Status: AC

## 2021-12-01 SURGICAL SUPPLY — 16 items
CATH 5FR JL3.5 JR4 ANG PIG MP (CATHETERS) ×1 IMPLANT
CATH BALLN WEDGE 5F 110CM (CATHETERS) IMPLANT
CATH SWAN GANZ 7F STRAIGHT (CATHETERS) ×1 IMPLANT
DEVICE RAD COMP TR BAND LRG (VASCULAR PRODUCTS) ×1 IMPLANT
GLIDESHEATH SLEND SS 6F .021 (SHEATH) ×1 IMPLANT
GUIDEWIRE INQWIRE 1.5J.035X260 (WIRE) IMPLANT
INQWIRE 1.5J .035X260CM (WIRE) ×2
KIT HEART LEFT (KITS) ×4 IMPLANT
PACK CARDIAC CATHETERIZATION (CUSTOM PROCEDURE TRAY) ×4 IMPLANT
SHEATH GLIDE SLENDER 4/5FR (SHEATH) ×1 IMPLANT
SHEATH PINNACLE 7F 10CM (SHEATH) ×1 IMPLANT
SHEATH PROBE COVER 6X72 (BAG) ×1 IMPLANT
SYR MEDRAD MARK 7 150ML (SYRINGE) ×2 IMPLANT
TRANSDUCER W/STOPCOCK (MISCELLANEOUS) ×4 IMPLANT
TUBING CIL FLEX 10 FLL-RA (TUBING) ×4 IMPLANT
WIRE HI TORQ VERSACORE-J 145CM (WIRE) ×1 IMPLANT

## 2021-12-01 NOTE — H&P (Signed)
See. Dr. Nelly Laurence full note from 11/10/21.   Sarah Pope 12/01/2021 7:48 AM

## 2021-12-01 NOTE — Progress Notes (Signed)
Site area: Right groin a 7 french venois sheasth was removed  Site Prior to Removal:  Level 0  Pressure Applied For 15 MINUTES    Bedrest Beginning at 0930am  Manual:   Yes.    Patient Status During Pull:  stable  Post Pull Groin Site:  Level 0  Post Pull Instructions Given:  Yes.    Post Pull Pulses Present:  Yes.    Dressing Applied:  Yes.    Comments:

## 2021-12-01 NOTE — Discharge Instructions (Signed)
Hold metformin 48 hours post cath 

## 2021-12-01 NOTE — Interval H&P Note (Signed)
History and Physical Interval Note:  12/01/2021 8:01 AM  [name]  has presented today for surgery, with the diagnosis of severe aortic stenosis.  The various methods of treatment have been discussed with the patient and family. After consideration of risks, benefits and other options for treatment, the patient has consented to  Procedure(s): RIGHT/LEFT HEART CATH AND CORONARY ANGIOGRAPHY (N/A) as a surgical intervention.  The patient's history has been reviewed, patient examined, no change in status, stable for surgery.  I have reviewed the patient's chart and labs.  Questions were answered to the patient's satisfaction.    Cath Lab Visit (complete for each Cath Lab visit)  Clinical Evaluation Leading to the Procedure:   ACS: No.  Non-ACS:    Anginal Classification: CCS II  Anti-ischemic medical therapy: No Therapy  Non-Invasive Test Results: No non-invasive testing performed  Prior CABG: No previous CABG        Sarah Pope

## 2021-12-03 ENCOUNTER — Encounter (HOSPITAL_COMMUNITY): Payer: Self-pay

## 2021-12-03 ENCOUNTER — Emergency Department (HOSPITAL_COMMUNITY): Payer: HMO

## 2021-12-03 ENCOUNTER — Other Ambulatory Visit: Payer: Self-pay

## 2021-12-03 ENCOUNTER — Emergency Department (HOSPITAL_COMMUNITY)
Admission: EM | Admit: 2021-12-03 | Discharge: 2021-12-03 | Disposition: A | Discharge status: Home / Self Care | Payer: HMO | Attending: Emergency Medicine | Admitting: Emergency Medicine

## 2021-12-03 DIAGNOSIS — J40 Bronchitis, not specified as acute or chronic: Secondary | ICD-10-CM | POA: Diagnosis not present

## 2021-12-03 DIAGNOSIS — Z794 Long term (current) use of insulin: Secondary | ICD-10-CM | POA: Diagnosis not present

## 2021-12-03 DIAGNOSIS — Z79899 Other long term (current) drug therapy: Secondary | ICD-10-CM | POA: Insufficient documentation

## 2021-12-03 DIAGNOSIS — Z20822 Contact with and (suspected) exposure to covid-19: Secondary | ICD-10-CM | POA: Insufficient documentation

## 2021-12-03 DIAGNOSIS — Z7982 Long term (current) use of aspirin: Secondary | ICD-10-CM | POA: Diagnosis not present

## 2021-12-03 DIAGNOSIS — Z7984 Long term (current) use of oral hypoglycemic drugs: Secondary | ICD-10-CM | POA: Diagnosis not present

## 2021-12-03 DIAGNOSIS — R059 Cough, unspecified: Secondary | ICD-10-CM | POA: Diagnosis present

## 2021-12-03 DIAGNOSIS — E119 Type 2 diabetes mellitus without complications: Secondary | ICD-10-CM | POA: Diagnosis not present

## 2021-12-03 DIAGNOSIS — I10 Essential (primary) hypertension: Secondary | ICD-10-CM | POA: Insufficient documentation

## 2021-12-03 LAB — CBC WITH DIFFERENTIAL/PLATELET
Abs Immature Granulocytes: 0.02 10*3/uL (ref 0.00–0.07)
Basophils Absolute: 0.1 10*3/uL (ref 0.0–0.1)
Basophils Relative: 1 %
Eosinophils Absolute: 0.9 10*3/uL — ABNORMAL HIGH (ref 0.0–0.5)
Eosinophils Relative: 10 %
HCT: 37.9 % (ref 36.0–46.0)
Hemoglobin: 12.3 g/dL (ref 12.0–15.0)
Immature Granulocytes: 0 %
Lymphocytes Relative: 26 %
Lymphs Abs: 2.2 10*3/uL (ref 0.7–4.0)
MCH: 26.5 pg (ref 26.0–34.0)
MCHC: 32.5 g/dL (ref 30.0–36.0)
MCV: 81.5 fL (ref 80.0–100.0)
Monocytes Absolute: 0.7 10*3/uL (ref 0.1–1.0)
Monocytes Relative: 8 %
Neutro Abs: 4.6 10*3/uL (ref 1.7–7.7)
Neutrophils Relative %: 55 %
Platelets: 251 10*3/uL (ref 150–400)
RBC: 4.65 MIL/uL (ref 3.87–5.11)
RDW: 13 % (ref 11.5–15.5)
WBC: 8.4 10*3/uL (ref 4.0–10.5)
nRBC: 0 % (ref 0.0–0.2)

## 2021-12-03 LAB — COMPREHENSIVE METABOLIC PANEL
ALT: 17 U/L (ref 0–44)
AST: 18 U/L (ref 15–41)
Albumin: 3.8 g/dL (ref 3.5–5.0)
Alkaline Phosphatase: 74 U/L (ref 38–126)
Anion gap: 9 (ref 5–15)
BUN: 13 mg/dL (ref 8–23)
CO2: 28 mmol/L (ref 22–32)
Calcium: 9.4 mg/dL (ref 8.9–10.3)
Chloride: 102 mmol/L (ref 98–111)
Creatinine, Ser: 0.64 mg/dL (ref 0.44–1.00)
GFR, Estimated: >60 mL/min (ref >60)
Glucose, Bld: 175 mg/dL — ABNORMAL HIGH (ref 70–99)
Potassium: 4.2 mmol/L (ref 3.5–5.1)
Sodium: 139 mmol/L (ref 135–145)
Total Bilirubin: 0.9 mg/dL (ref 0.3–1.2)
Total Protein: 6.8 g/dL (ref 6.5–8.1)

## 2021-12-03 LAB — RESP PANEL BY RT-PCR (FLU A&B, COVID) ARPGX2
Influenza A by PCR: NEGATIVE
Influenza B by PCR: NEGATIVE
SARS Coronavirus 2 by RT PCR: NEGATIVE

## 2021-12-03 MED ORDER — AZITHROMYCIN 250 MG PO TABS: ORAL_TABLET | ORAL | 0 refills | Status: DC

## 2021-12-03 MED ORDER — ALBUTEROL SULFATE HFA 108 (90 BASE) MCG/ACT IN AERS
2.0000 | INHALATION_SPRAY | Freq: Once | RESPIRATORY_TRACT | Status: AC
  Administered 2021-12-03: 2 via RESPIRATORY_TRACT
  Filled 2021-12-03: qty 6.7

## 2021-12-03 NOTE — Discharge Instructions (Signed)
Follow-up with your family doctor for persistent cough.  He is albuterol at home as needed for the wheezing

## 2021-12-03 NOTE — ED Triage Notes (Signed)
Pov from home. Cc of cough "for a while" caused her head to hurt and her chest to be sore. 95% after walking to tx room. Has been taking robitussin. Has not called PCP.  Home covid test -

## 2021-12-03 NOTE — ED Provider Notes (Signed)
[facility] EMERGENCY DEPARTMENT Provider Note   CSN: [id number] Arrival date & time: 12/03/21  1459     History  Chief Complaint  Patient presents with   Cough    Sarah Pope is a 66 y.o. female.  Patient with diabetes and hypertension.  Patient complains of persistent cough.  She does also have yellow sputum production  The history is provided by the patient and medical records. No language interpreter was used.  Cough Cough characteristics:  Productive Sputum characteristics:  Nondescript Severity:  Moderate Timing:  Constant Progression:  Waxing and waning Chronicity:  New Smoker: no   Context: not animal exposure   Relieved by:  Nothing Worsened by:  Nothing Associated symptoms: no chest pain, no eye discharge, no headaches and no rash       Home Medications Prior to Admission medications   Medication Sig Start Date End Date Taking? Authorizing Provider  azithromycin (ZITHROMAX Z-PAK) 250 MG tablet 2 po day one, then 1 daily x 4 days 12/03/21  Yes Milton Ferguson, MD  albuterol (VENTOLIN HFA) 108 (90 Base) MCG/ACT inhaler INHALE TWO PUFFS INTO THE LUNGS EVERY 6 HOURS AS NEEDED FOR WHEEZING OR SHORTNESS OF BREATH Patient taking differently: Inhale 2 puffs into the lungs every 6 (six) hours as needed for shortness of breath. 05/18/21   Elvia Collum M, DO  aspirin EC 81 MG tablet Take 81 mg by mouth daily.    [provider]  atorvastatin (LIPITOR) 20 MG tablet Take 1 tablet (20 mg total) by mouth daily. 04/18/21   Lovena Le, Malena M, DO  azelastine (ASTELIN) 0.1 % nasal spray Place 1 spray into both nostrils 2 (two) times daily. Use in each nostril as directed    [provider]  cetirizine (ZYRTEC) 10 MG tablet Take 10 mg by mouth daily as needed for allergies.    [provider]  ciclopirox (PENLAC) 8 % solution Apply topically at bedtime. Apply over nail and surrounding skin. Apply daily over previous coat. After seven (7) days, may remove  with alcohol and continue cycle. Patient not taking: Reported on 11/25/2021 02/22/21   Trula Slade, DPM  citalopram (CELEXA) 40 MG tablet Take 1 tablet (40 mg total) by mouth daily. 04/18/21   Elvia Collum M, DO  Continuous Blood Gluc Receiver (FREESTYLE LIBRE 14 DAY READER) DEVI USE AS DIRECTED THREE TIMES DAILY. 09/15/20   Elvia Collum M, DO  Continuous Blood Gluc Sensor (FREESTYLE LIBRE 14 DAY SENSOR) MISC USE AS DIRECTED THREE TIMES DAILY WITH SLIDING SCALE INSULIN USE. 09/15/20   Lovena Le, Malena M, DO  diclofenac (VOLTAREN) 75 MG EC tablet TAKE ONE TABLET BY MOUTH TWICE daily PRN pain. TAKE WITH FOOD. Patient not taking: Reported on 11/25/2021 04/18/21   Elvia Collum M, DO  doxycycline (VIBRA-TABS) 100 MG tablet Take 100 mg by mouth 2 (two) times daily.    [provider]  Fluticasone-Salmeterol (ADVAIR DISKUS) 250-50 MCG/DOSE AEPB Inhale 1 puff into the lungs 2 (two) times daily. 08/16/20   Lovena Le, Malena M, DO  hydrochlorothiazide (HYDRODIURIL) 12.5 MG tablet Take 1 tablet (12.5 mg total) by mouth daily. 04/18/21   Lovena Le, Malena M, DO  insulin glargine (LANTUS) 100 unit/mL SOPN Inject 80 Units into the skin daily. 04/18/21   Lovena Le, Malena M, DO  insulin lispro (HUMALOG) 100 UNIT/ML injection Inject 0.12-0.18 mLs (12-18 Units total) into the skin 3 (three) times daily with meals. Patient taking differently: Inject 12-18 Units into the skin 3 (three) times daily with  meals. Sliding scale if needed 08/01/21   Brita Romp, NP  levothyroxine (SYNTHROID) 175 MCG tablet Take 1 tablet (175 mcg total) by mouth daily. 04/18/21   Lovena Le, Malena M, DO  losartan (COZAAR) 50 MG tablet Take 1 tablet (50 mg total) by mouth daily. Needs labs. 04/18/21   Elvia Collum M, DO  metFORMIN (GLUCOPHAGE) 1000 MG tablet Take 1 tablet (1,000 mg total) by mouth 2 (two) times daily with a meal. 04/18/21   Lovena Le, Malena M, DO  omeprazole (PRILOSEC) 40 MG capsule Take 1 capsule (40 mg total) by mouth  daily. 04/18/21   Erven Colla, DO      Allergies    Codeine and Sulfa antibiotics    Review of Systems   Review of Systems  Constitutional:  Negative for appetite change and fatigue.  HENT:  Negative for congestion, ear discharge and sinus pressure.   Eyes:  Negative for discharge.  Respiratory:  Positive for cough.   Cardiovascular:  Negative for chest pain.  Gastrointestinal:  Negative for abdominal pain and diarrhea.  Genitourinary:  Negative for frequency and hematuria.  Musculoskeletal:  Negative for back pain.  Skin:  Negative for rash.  Neurological:  Negative for seizures and headaches.  Psychiatric/Behavioral:  Negative for hallucinations.    Physical Exam Updated Vital Signs BP (!) 142/61    Pulse 69    Temp 98.2 F (36.8 C)    Resp 20    Ht 5\' 5"  (1.651 m)    Wt 89.4 kg    SpO2 91%    BMI 32.78 kg/m  Physical Exam Vitals and nursing note reviewed.  Constitutional:      Appearance: She is well-developed.  HENT:     Head: Normocephalic.     Nose: Nose normal.  Eyes:     General: No scleral icterus.    Conjunctiva/sclera: Conjunctivae normal.  Neck:     Thyroid: No thyromegaly.  Cardiovascular:     Rate and Rhythm: Normal rate and regular rhythm.     Heart sounds: No murmur heard.   No friction rub. No gallop.  Pulmonary:     Breath sounds: No stridor. No wheezing or rales.  Chest:     Chest wall: No tenderness.  Abdominal:     General: There is no distension.     Tenderness: There is no abdominal tenderness. There is no rebound.  Musculoskeletal:        General: Normal range of motion.     Cervical back: Neck supple.  Lymphadenopathy:     Cervical: No cervical adenopathy.  Skin:    Findings: No erythema or rash.  Neurological:     Mental Status: She is alert and oriented to person, place, and time.     Motor: No abnormal muscle tone.     Coordination: Coordination normal.  Psychiatric:        Behavior: Behavior normal.    ED Results /  Procedures / Treatments   Labs (all labs ordered are listed, but only abnormal results are displayed) Labs Reviewed  CBC WITH DIFFERENTIAL/PLATELET - Abnormal; Notable for the following components:      Result Value   Eosinophils Absolute 0.9 (*)    All other components within normal limits  COMPREHENSIVE METABOLIC PANEL - Abnormal; Notable for the following components:   Glucose, Bld 175 (*)    All other components within normal limits  RESP PANEL BY RT-PCR (FLU A&B, COVID) ARPGX2    EKG None  Radiology DG  Chest Portable 1 View  Result Date: 12/03/2021 CLINICAL DATA:  Cough and shortness of breath. History of asthma and hypertension. EXAM: PORTABLE CHEST 1 VIEW COMPARISON:  10/25/2021 FINDINGS: Stable cardiomediastinal contours. No pleural effusion or edema identified. Aortic atherosclerotic calcifications noted. The visualized osseous structures are unremarkable. IMPRESSION: 1. No active disease. 2. Aortic atherosclerosis. Electronically Signed   By: Kerby Moors M.D.   On: 12/03/2021 16:07    Procedures Procedures    Medications Ordered in ED Medications  albuterol (VENTOLIN HFA) 108 (90 Base) MCG/ACT inhaler 2 puff (2 puffs Inhalation Given 12/03/21 1639)    ED Course/ Medical Decision Making/ A&P                           Medical Decision Making Amount and/or Complexity of Data Reviewed Labs: ordered. Radiology: ordered.  Risk Prescription drug management.   Patient with bronchitis and bronchospasm.  She is to use albuterol and is placed on a Z-Pak, follow-up PCP   This patient presents to the ED for concern of cough, this involves an extensive number of treatment options, and is a complaint that carries with it a high risk of complications and morbidity.  The differential diagnosis includes pneumonia heart failure bronchitis   Co morbidities that complicate the patient evaluation  - Diabetes,htn   Additional history obtained:  Additional history  obtained from significant other External records from outside source obtained and reviewed including hospital record   Lab Tests:  I Ordered, and personally interpreted labs.  The pertinent results include: CBC and chemistry which show glucose 175   Imaging Studies ordered:  I ordered imaging studies including chest x-ray I independently visualized and interpreted imaging which showed negative I agree with the radiologist interpretation   Cardiac Monitoring:  No monitor  Medicines ordered and prescription drug management:  No medicine Reevaluation of the patient after these medicines showed that the patient stayed the same I have reviewed the patients home medicines and have made adjustments as needed   Test Considered:  CT chest   Critical Interventions:  None   Consultations Obtained:  No consulted  Problem List / ED Course:  Bronchitis  Reevaluation:  After the interventions noted above, I reevaluated the patient and found that they have :stayed the same   Social Determinants of Health:  None   Dispostion:  After consideration of the diagnostic results and the patients response to treatment, I feel that the patent would benefit from discharge home with albuterol inhaler Zithromax.         Final Clinical Impression(s) / ED Diagnoses Final diagnoses:  Bronchitis    Rx / DC Orders ED Discharge Orders          Ordered    azithromycin (ZITHROMAX Z-PAK) 250 MG tablet        12/03/21 1704              Milton Ferguson, MD 12/05/21 1115

## 2021-12-05 ENCOUNTER — Other Ambulatory Visit: Payer: Self-pay | Admitting: Family Medicine

## 2021-12-12 DIAGNOSIS — I10 Essential (primary) hypertension: Secondary | ICD-10-CM | POA: Diagnosis not present

## 2021-12-12 DIAGNOSIS — E1169 Type 2 diabetes mellitus with other specified complication: Secondary | ICD-10-CM | POA: Diagnosis not present

## 2021-12-12 DIAGNOSIS — Z6833 Body mass index (BMI) 33.0-33.9, adult: Secondary | ICD-10-CM | POA: Diagnosis not present

## 2021-12-12 DIAGNOSIS — J45909 Unspecified asthma, uncomplicated: Secondary | ICD-10-CM | POA: Diagnosis not present

## 2021-12-12 DIAGNOSIS — I350 Nonrheumatic aortic (valve) stenosis: Secondary | ICD-10-CM | POA: Diagnosis not present

## 2021-12-12 DIAGNOSIS — E1165 Type 2 diabetes mellitus with hyperglycemia: Secondary | ICD-10-CM | POA: Diagnosis not present

## 2021-12-12 DIAGNOSIS — E039 Hypothyroidism, unspecified: Secondary | ICD-10-CM | POA: Diagnosis not present

## 2021-12-12 DIAGNOSIS — I2510 Atherosclerotic heart disease of native coronary artery without angina pectoris: Secondary | ICD-10-CM | POA: Diagnosis not present

## 2021-12-15 ENCOUNTER — Other Ambulatory Visit: Payer: Self-pay | Admitting: Cardiology

## 2021-12-15 ENCOUNTER — Ambulatory Visit (HOSPITAL_COMMUNITY)
Admission: RE | Admit: 2021-12-15 | Discharge: 2021-12-15 | Disposition: A | Discharge status: Home / Self Care | Payer: HMO | Source: Ambulatory Visit | Attending: Cardiology | Admitting: Cardiology

## 2021-12-15 ENCOUNTER — Other Ambulatory Visit: Payer: Self-pay

## 2021-12-15 DIAGNOSIS — I350 Nonrheumatic aortic (valve) stenosis: Secondary | ICD-10-CM | POA: Diagnosis not present

## 2021-12-15 MED ORDER — METOPROLOL TARTRATE 5 MG/5ML IV SOLN
5.0000 mg | INTRAVENOUS | Status: DC | PRN
  Administered 2021-12-15: 5 mg via INTRAVENOUS

## 2021-12-15 MED ORDER — METOPROLOL TARTRATE 5 MG/5ML IV SOLN
INTRAVENOUS | Status: AC
  Administered 2021-12-15: 5 mg via INTRAVENOUS
  Filled 2021-12-15: qty 10

## 2021-12-15 MED ORDER — IOHEXOL 350 MG/ML SOLN
95.0000 mL | Freq: Once | INTRAVENOUS | Status: AC | PRN
  Administered 2021-12-15: 95 mL via INTRAVENOUS

## 2021-12-15 NOTE — Progress Notes (Signed)
CT scan completed. Tolerated well. D/c home ambulatory, awake and alert. In no distress 

## 2021-12-27 DIAGNOSIS — E039 Hypothyroidism, unspecified: Secondary | ICD-10-CM | POA: Diagnosis not present

## 2021-12-27 DIAGNOSIS — E1165 Type 2 diabetes mellitus with hyperglycemia: Secondary | ICD-10-CM | POA: Diagnosis not present

## 2021-12-27 DIAGNOSIS — E038 Other specified hypothyroidism: Secondary | ICD-10-CM | POA: Diagnosis not present

## 2021-12-27 DIAGNOSIS — J449 Chronic obstructive pulmonary disease, unspecified: Secondary | ICD-10-CM | POA: Diagnosis not present

## 2021-12-27 DIAGNOSIS — F330 Major depressive disorder, recurrent, mild: Secondary | ICD-10-CM | POA: Diagnosis not present

## 2021-12-27 DIAGNOSIS — Z794 Long term (current) use of insulin: Secondary | ICD-10-CM | POA: Diagnosis not present

## 2021-12-27 DIAGNOSIS — E1136 Type 2 diabetes mellitus with diabetic cataract: Secondary | ICD-10-CM | POA: Diagnosis not present

## 2021-12-27 DIAGNOSIS — E1169 Type 2 diabetes mellitus with other specified complication: Secondary | ICD-10-CM | POA: Diagnosis not present

## 2021-12-27 DIAGNOSIS — B372 Candidiasis of skin and nail: Secondary | ICD-10-CM | POA: Diagnosis not present

## 2021-12-27 DIAGNOSIS — G63 Polyneuropathy in diseases classified elsewhere: Secondary | ICD-10-CM | POA: Diagnosis not present

## 2021-12-27 DIAGNOSIS — E669 Obesity, unspecified: Secondary | ICD-10-CM | POA: Diagnosis not present

## 2021-12-27 DIAGNOSIS — E559 Vitamin D deficiency, unspecified: Secondary | ICD-10-CM | POA: Diagnosis not present

## 2022-01-03 ENCOUNTER — Ambulatory Visit: Payer: 59 | Admitting: Cardiology

## 2022-01-03 NOTE — Progress Notes (Deleted)
? ? ? ?Clinical Summary ?Sarah Pope is a 66 y.o.female ? ?seen today for follow up of the following medical problems.  ?  ?1. Aortic stenosis ?- Jan 2014 echo LVEF 55-60%, mild AV and MV anular calcification ?  ?05/2020 echo LVEF 65-70%, mod to severe AS (AVA VTI 0.77 mean 30 DI 0.30) ?  ?  ?11/2020 echo LVEF 55-60%, mod to severe AS mean grad 30, AVA VTI 0.92, DI 0.24, SVI 38 ?-recent SOB/DOE. Example walking trashcans to the road now DOE.  ?- slight chest pains with activity ?  ?09/2021 echo: LVEF 55-60%, grade I dd, AV mean gradient 29, AVA VTI 0.60, DI 0.18 SVI 27 ?- there is a mean gradient measured with the Pedhoff probe of 41 mmHg. The 2D Doppler images likely underestimate gradient based on angle of acquisition.  ?- ongoing DOE with activities ?  ?  ?2. HTN ?- compliant with meds ?  ?3. Carotid stenosis ?- 11/2020 carotid US: RICA 5-05%, LICA NL ?  ?  ?  ?Past Medical History:  ?Diagnosis Date  ? Acute respiratory failure with hypercapnia (HCC)   ? Allergy   ? Anxiety   ? Asthma   ? Bronchitis with bronchospasm   ? Cancer [facility])   ? skin  ? Depression   ? DM type 2 (diabetes mellitus, type 2) ([place])   ? FATTY LIVER DISEASE 08/05/2008  ? GERD (gastroesophageal reflux disease)   ? Hyperlipidemia   ? Hypertension   ? Hypothyroidism   ? Kidney stones   ? Leukocytosis   ? Obesity, Class III, BMI 40-49.9 (morbid obesity) ([place])   ? PERSONAL HX COLONIC POLYPS 09/01/2008  ? Seasonal allergies   ? TRANSAMINASES, SERUM, ELEVATED 08/05/2008  ? ? ? ?Allergies  ?Allergen Reactions  ? Codeine Nausea Only  ? Sulfa Antibiotics   ?  itching  ? ? ? ?Current Outpatient Medications  ?Medication Sig Dispense Refill  ? albuterol (VENTOLIN HFA) 108 (90 Base) MCG/ACT inhaler INHALE TWO PUFFS INTO THE LUNGS EVERY 6 HOURS AS NEEDED FOR WHEEZING OR SHORTNESS OF BREATH (Patient taking differently: Inhale 2 puffs into the lungs every 6 (six) hours as needed for shortness of breath.) 18 g 2  ? aspirin EC 81 MG tablet Take 81 mg by mouth daily.     ? atorvastatin (LIPITOR) 20 MG tablet Take 1 tablet (20 mg total) by mouth daily. 90 tablet 1  ? azelastine (ASTELIN) 0.1 % nasal spray Place 1 spray into both nostrils 2 (two) times daily. Use in each nostril as directed    ? azithromycin (ZITHROMAX Z-PAK) 250 MG tablet 2 po day one, then 1 daily x 4 days 6 tablet 0  ? cetirizine (ZYRTEC) 10 MG tablet Take 10 mg by mouth daily as needed for allergies.    ? ciclopirox (PENLAC) 8 % solution Apply topically at bedtime. Apply over nail and surrounding skin. Apply daily over previous coat. After seven (7) days, may remove with alcohol and continue cycle. (Patient not taking: Reported on 11/25/2021) 6.6 mL 2  ? citalopram (CELEXA) 40 MG tablet Take 1 tablet (40 mg total) by mouth daily. 90 tablet 1  ? Continuous Blood Gluc Receiver (FREESTYLE LIBRE 14 DAY READER) DEVI USE AS DIRECTED THREE TIMES DAILY. 1 each PRN  ? Continuous Blood Gluc Sensor (FREESTYLE LIBRE 14 DAY SENSOR) MISC USE AS DIRECTED THREE TIMES DAILY WITH SLIDING SCALE INSULIN USE. 2 each PRN  ? diclofenac (VOLTAREN) 75 MG EC tablet TAKE ONE TABLET BY  MOUTH TWICE daily PRN pain. TAKE WITH FOOD. (Patient not taking: Reported on 11/25/2021) 28 tablet 5  ? doxycycline (VIBRA-TABS) 100 MG tablet Take 100 mg by mouth 2 (two) times daily.    ? Fluticasone-Salmeterol (ADVAIR DISKUS) 250-50 MCG/DOSE AEPB Inhale 1 puff into the lungs 2 (two) times daily. 1 each 3  ? hydrochlorothiazide (HYDRODIURIL) 12.5 MG tablet Take 1 tablet (12.5 mg total) by mouth daily. 90 tablet 1  ? insulin glargine (LANTUS) 100 unit/mL SOPN Inject 80 Units into the skin daily. 15 mL 2  ? insulin lispro (HUMALOG) 100 UNIT/ML injection Inject 0.12-0.18 mLs (12-18 Units total) into the skin 3 (three) times daily with meals. (Patient taking differently: Inject 12-18 Units into the skin 3 (three) times daily with meals. Sliding scale if needed) 10 mL 1  ? levothyroxine (SYNTHROID) 175 MCG tablet Take 1 tablet (175 mcg total) by mouth daily. 90  tablet 1  ? losartan (COZAAR) 50 MG tablet Take 1 tablet (50 mg total) by mouth daily. Needs labs. 90 tablet 0  ? metFORMIN (GLUCOPHAGE) 1000 MG tablet Take 1 tablet (1,000 mg total) by mouth 2 (two) times daily with a meal. 180 tablet 1  ? omeprazole (PRILOSEC) 40 MG capsule Take 1 capsule (40 mg total) by mouth daily. 90 capsule 1  ? ?Current Facility-Administered Medications  ?Medication Dose Route Frequency Provider Last Rate Last Admin  ? sodium chloride flush (NS) 0.9 % injection 3 mL  3 mL Intravenous Q12H Faun Mcqueen, Alphonse Guild, MD      ? ? ? ?Past Surgical History:  ?Procedure Laterality Date  ? COLONOSCOPY  2013  ? Repeat 10 years  ? [place]  ? realignment  ? NOSE SURGERY  2007  ? sinus infections  ? POLYPECTOMY    ? RIGHT HEART CATH AND CORONARY ANGIOGRAPHY N/A 12/01/2021  ? Procedure: RIGHT HEART CATH AND CORONARY ANGIOGRAPHY;  Surgeon: Burnell Blanks, MD;  Location: [facility];  Service: Cardiovascular;  Laterality: N/A;  ? skin cancer removal    ? TONSILLECTOMY    ? ? ? ?Allergies  ?Allergen Reactions  ? Codeine Nausea Only  ? Sulfa Antibiotics   ?  itching  ? ? ? ? ?Family History  ?Problem Relation Age of Onset  ? Cancer Mother   ? Hypertension Mother   ? Thyroid disease Mother   ? Breast cancer Mother   ? Diabetes Mother   ? Prostate cancer Father   ? Heart attack Maternal Grandmother   ? Allergies Daughter   ? Colon cancer Neg Hx   ? Esophageal cancer Neg Hx   ? Rectal cancer Neg Hx   ? Stomach cancer Neg Hx   ? Colon polyps Neg Hx   ? ? ? ?Social History ?Ms. Lavallie reports that she quit smoking about 39 years ago. Her smoking use included cigarettes. She has a 2.50 pack-year smoking history. She has never used smokeless tobacco. ?Ms. Collier reports no history of alcohol use. ? ? ?Review of Systems ?CONSTITUTIONAL: No weight loss, fever, chills, weakness or fatigue.  ?HEENT: Eyes: No visual loss, blurred vision, double vision or yellow sclerae.No hearing loss, sneezing,  congestion, runny nose or sore throat.  ?SKIN: No rash or itching.  ?CARDIOVASCULAR:  ?RESPIRATORY: No shortness of breath, cough or sputum.  ?GASTROINTESTINAL: No anorexia, nausea, vomiting or diarrhea. No abdominal pain or blood.  ?GENITOURINARY: No burning on urination, no polyuria ?NEUROLOGICAL: No headache, dizziness, syncope, paralysis, ataxia, numbness or tingling in  the extremities. No change in bowel or bladder control.  ?MUSCULOSKELETAL: No muscle, back pain, joint pain or stiffness.  ?LYMPHATICS: No enlarged nodes. No history of splenectomy.  ?PSYCHIATRIC: No history of depression or anxiety.  ?ENDOCRINOLOGIC: No reports of sweating, cold or heat intolerance. No polyuria or polydipsia.  ?. ? ? ?Physical Examination ?There were no vitals filed for this visit. ?There were no vitals filed for this visit. ? ?Gen: resting comfortably, no acute distress ?HEENT: no scleral icterus, pupils equal round and reactive, no palptable cervical adenopathy,  ?CV ?Resp: Clear to auscultation bilaterally ?GI: abdomen is soft, non-tender, non-distended, normal bowel sounds, no hepatosplenomegaly ?MSK: extremities are warm, no edema.  ?Skin: warm, no rash ?Neuro:  no focal deficits ?Psych: appropriate affect ? ? ?Diagnostic Studies ? ?05/2020 echo ?IMPRESSIONS  ? ? ? 1. Left ventricular ejection fraction, by estimation, is 65 to 70%. The  ?left ventricle has normal function. The left ventricle has no regional  ?wall motion abnormalities. There is mild left ventricular hypertrophy.  ?Left ventricular diastolic parameters  ?are consistent with Grade I diastolic dysfunction (impaired relaxation).  ? 2. Right ventricular systolic function is normal. The right ventricular  ?size is normal. There is normal pulmonary artery systolic pressure. The  ?estimated right ventricular systolic pressure is 46.5 mmHg.  ? 3. The mitral valve is grossly normal. Trivial mitral valve  ?regurgitation.  ? 4. The aortic valve is tricuspid, moderately  calcified with decreased  ?cusp excursion. Aortic valve regurgitation is trivial. Moderate to severe  ?aortic valve stenosis. Aortic valve area, by VTI measures 0.77 cm?Marland Kitchen Aortic  ?valve mean gradient meas

## 2022-01-18 ENCOUNTER — Encounter: Payer: Self-pay | Admitting: Surgery

## 2022-01-18 ENCOUNTER — Institutional Professional Consult (permissible substitution): Payer: HMO | Admitting: Surgery

## 2022-01-18 VITALS — BP 131/61 | HR 101 | Resp 20 | Ht 65.0 in | Wt 197.0 lb

## 2022-01-18 DIAGNOSIS — I350 Nonrheumatic aortic (valve) stenosis: Secondary | ICD-10-CM | POA: Diagnosis not present

## 2022-01-18 NOTE — Progress Notes (Signed)
Patient ID: Sarah Pope, female   DOB: 1956-10-22, 66 y.o.   MRN: [id number] ? ?[facility]   ?MULTIDISCIPLIN[facility] VALVE CLINIC ?  ? ? ? ?   ?[address] ?      [place] [address] ?            [phone]   ?      ? ?CARDIOTHORACIC SURGERY CONSULTATION REPORT ? ?PCP is [place] Nation, MD ?Referring Provider is Lauree Chandler, MD ?Primary Cardiologist is Carlyle Dolly, MD ? ?Reason for consultation:  Severe aortic stenosis ? ?HPI: ? ?The patient is a 66 year old woman with a history of type 2 diabetes, hypertension, hyperlipidemia, hypothyroidism, obesity,  and severe aortic stenosis that has been followed by Dr. Harl Bowie who was admitted in early January 2023 with acute hypoxemic respiratory failure that was felt to be due to an asthma exacerbation.  She presented with cough, shortness of breath, and wheezing.  She improved with IV steroids and bronchodilators and transient use of oxygen she is successfully weaned off oxygen and sent home.  Her echocardiogram on 10/19/2021 had shown a trileaflet aortic valve that was heavily calcified with a mean gradient of 28.7 mmHg and a peak gradient of 54 mmHg.  Valve area by VTI was 0.57 cm?.  Left ventricular ejection fraction was 55 to 60%.  Stroke-volume index was 27.  She underwent cardiac catheterization on 12/01/2021 in anticipation of needing aortic valve replacement.  This showed no angiographic evidence of coronary disease with normal right heart pressures. ? ?She is here today with her aunt.  She continues to report exertional fatigue and shortness of breath as well as some substernal chest tightness with exertion.  She has had dizzy spells.  She has had some peripheral edema. ? ?Past Medical History:  ?Diagnosis Date  ? Acute respiratory failure with hypercapnia (HCC)   ? Allergy   ? Anxiety   ? Asthma   ? Bronchitis with bronchospasm   ? Cancer [facility])   ? skin  ? Depression   ? DM type 2 (diabetes mellitus, type 2)  ([place])   ? FATTY LIVER DISEASE 08/05/2008  ? GERD (gastroesophageal reflux disease)   ? Hyperlipidemia   ? Hypertension   ? Hypothyroidism   ? Kidney stones   ? Leukocytosis   ? Obesity, Class III, BMI 40-49.9 (morbid obesity) ([place])   ? PERSONAL HX COLONIC POLYPS 09/01/2008  ? Seasonal allergies   ? TRANSAMINASES, SERUM, ELEVATED 08/05/2008  ? ? ?Past Surgical History:  ?Procedure Laterality Date  ? COLONOSCOPY  2013  ? Repeat 10 years  ? [place]  ? realignment  ? NOSE SURGERY  2007  ? sinus infections  ? POLYPECTOMY    ? RIGHT HEART CATH AND CORONARY ANGIOGRAPHY N/A 12/01/2021  ? Procedure: RIGHT HEART CATH AND CORONARY ANGIOGRAPHY;  Surgeon: Burnell Blanks, MD;  Location: [facility];  Service: Cardiovascular;  Laterality: N/A;  ? skin cancer removal    ? TONSILLECTOMY    ? ? ?Family History  ?Problem Relation Age of Onset  ? Cancer Mother   ? Hypertension Mother   ? Thyroid disease Mother   ? Breast cancer Mother   ? Diabetes Mother   ? Prostate cancer Father   ? Heart attack Maternal Grandmother   ? Allergies Daughter   ? Colon cancer Neg Hx   ? Esophageal cancer Neg Hx   ? Rectal cancer Neg Hx   ?  Stomach cancer Neg Hx   ? Colon polyps Neg Hx   ? ? ?Social History  ? ?Socioeconomic History  ? Marital status: Divorced  ?  Spouse name: Not on file  ? Number of children: Not on file  ? Years of education: Not on file  ? Highest education level: Not on file  ?Occupational History  ? Occupation: Building control surveyor at [employer]  ?Tobacco Use  ? Smoking status: Former  ?  Packs/day: 0.50  ?  Years: 5.00  ?  Pack years: 2.50  ?  Types: Cigarettes  ?  Quit date: 10/23/1982  ?  Years since quitting: 39.2  ? Smokeless tobacco: Never  ?Vaping Use  ? Vaping Use: Never used  ?Substance and Sexual Activity  ? Alcohol use: No  ? Drug use: No  ? Sexual activity: Not on file  ?Other Topics Concern  ? Not on file  ?Social History Narrative  ? Not on file  ? ?Social Determinants of Health  ? ?Financial  Resource Strain: Not on file  ?Food Insecurity: Not on file  ?Transportation Needs: Not on file  ?Physical Activity: Not on file  ?Stress: Not on file  ?Social Connections: Not on file  ?Intimate Partner Violence: Not on file  ? ? ?Prior to Admission medications   ?Medication Sig Start Date End Date Taking? Authorizing Provider  ?albuterol (VENTOLIN HFA) 108 (90 Base) MCG/ACT inhaler INHALE TWO PUFFS INTO THE LUNGS EVERY 6 HOURS AS NEEDED FOR WHEEZING OR SHORTNESS OF BREATH ?Patient taking differently: Inhale 2 puffs into the lungs every 6 (six) hours as needed for shortness of breath. 05/18/21  Yes Lovena Le, Malena M, DO  ?aspirin EC 81 MG tablet Take 81 mg by mouth daily.   Yes [provider]  ?atorvastatin (LIPITOR) 20 MG tablet Take 1 tablet (20 mg total) by mouth daily. 04/18/21  Yes Lovena Le, Malena M, DO  ?azelastine (ASTELIN) 0.1 % nasal spray Place 1 spray into both nostrils 2 (two) times daily. Use in each nostril as directed   Yes [provider]  ?azithromycin (ZITHROMAX Z-PAK) 250 MG tablet 2 po day one, then 1 daily x 4 days 12/03/21  Yes Milton Ferguson, MD  ?cetirizine (ZYRTEC) 10 MG tablet Take 10 mg by mouth daily as needed for allergies.   Yes [provider]  ?ciclopirox (PENLAC) 8 % solution Apply topically at bedtime. Apply over nail and surrounding skin. Apply daily over previous coat. After seven (7) days, may remove with alcohol and continue cycle. 02/22/21  Yes Trula Slade, DPM  ?citalopram (CELEXA) 40 MG tablet Take 1 tablet (40 mg total) by mouth daily. 04/18/21  Yes Elvia Collum M, DO  ?Continuous Blood Gluc Receiver (FREESTYLE LIBRE 14 DAY READER) DEVI USE AS DIRECTED THREE TIMES DAILY. 09/15/20  Yes Elvia Collum M, DO  ?Continuous Blood Gluc Sensor (FREESTYLE LIBRE 14 DAY SENSOR) MISC USE AS DIRECTED THREE TIMES DAILY WITH SLIDING SCALE INSULIN USE. 09/15/20  Yes Lovena Le, Malena M, DO  ?diclofenac (VOLTAREN) 75 MG EC tablet TAKE ONE TABLET BY MOUTH TWICE  daily PRN pain. TAKE WITH FOOD. 04/18/21  Yes Lovena Le, Malena M, DO  ?doxycycline (VIBRA-TABS) 100 MG tablet Take 100 mg by mouth 2 (two) times daily.   Yes [provider]  ?Fluticasone-Salmeterol (ADVAIR DISKUS) 250-50 MCG/DOSE AEPB Inhale 1 puff into the lungs 2 (two) times daily. 08/16/20  Yes Lovena Le, Malena M, DO  ?hydrochlorothiazide (HYDRODIURIL) 12.5 MG tablet Take 1 tablet (12.5 mg total) by mouth  daily. 04/18/21  Yes Lovena Le, Malena M, DO  ?insulin glargine (LANTUS) 100 unit/mL SOPN Inject 80 Units into the skin daily. 04/18/21  Yes Lovena Le, Malena M, DO  ?insulin lispro (HUMALOG) 100 UNIT/ML injection Inject 0.12-0.18 mLs (12-18 Units total) into the skin 3 (three) times daily with meals. ?Patient taking differently: Inject 12-18 Units into the skin 3 (three) times daily with meals. Sliding scale if needed 08/01/21  Yes Reardon, Juanetta Beets, NP  ?levothyroxine (SYNTHROID) 175 MCG tablet Take 1 tablet (175 mcg total) by mouth daily. 04/18/21  Yes Lovena Le, Malena M, DO  ?losartan (COZAAR) 50 MG tablet Take 1 tablet (50 mg total) by mouth daily. Needs labs. 04/18/21  Yes Lovena Le, Malena M, DO  ?metFORMIN (GLUCOPHAGE) 1000 MG tablet Take 1 tablet (1,000 mg total) by mouth 2 (two) times daily with a meal. 04/18/21  Yes Lovena Le, Malena M, DO  ?omeprazole (PRILOSEC) 40 MG capsule Take 1 capsule (40 mg total) by mouth daily. 04/18/21  Yes Elvia Collum M, DO  ? ? ?Current Outpatient Medications  ?Medication Sig Dispense Refill  ? albuterol (VENTOLIN HFA) 108 (90 Base) MCG/ACT inhaler INHALE TWO PUFFS INTO THE LUNGS EVERY 6 HOURS AS NEEDED FOR WHEEZING OR SHORTNESS OF BREATH (Patient taking differently: Inhale 2 puffs into the lungs every 6 (six) hours as needed for shortness of breath.) 18 g 2  ? aspirin EC 81 MG tablet Take 81 mg by mouth daily.    ? atorvastatin (LIPITOR) 20 MG tablet Take 1 tablet (20 mg total) by mouth daily. 90 tablet 1  ? azelastine (ASTELIN) 0.1 % nasal spray Place 1 spray into both nostrils 2  (two) times daily. Use in each nostril as directed    ? azithromycin (ZITHROMAX Z-PAK) 250 MG tablet 2 po day one, then 1 daily x 4 days 6 tablet 0  ? cetirizine (ZYRTEC) 10 MG tablet Take 10 mg by mouth daily as

## 2022-01-18 NOTE — Progress Notes (Signed)
Pre Surgical Assessment: 5 M Walk Test ? ?49M=16.79f ? ?5 Meter Walk Test- trial 1: 6.44 seconds ?5 Meter Walk Test- trial 2: 5.84 seconds ?5 Meter Walk Test- trial 3: 7.27 seconds ?5 Meter Walk Test Average: 6.51 seconds ?  ?

## 2022-01-24 ENCOUNTER — Encounter: Payer: Self-pay | Admitting: *Deleted

## 2022-01-24 ENCOUNTER — Other Ambulatory Visit: Payer: Self-pay | Admitting: *Deleted

## 2022-01-24 DIAGNOSIS — I350 Nonrheumatic aortic (valve) stenosis: Secondary | ICD-10-CM

## 2022-02-01 NOTE — Progress Notes (Addendum)
Surgical Instructions ? ? ? Your procedure is scheduled on 02/06/22. ? Report to [facility] Main Entrance "A" at 5:30 A.M., then check in with the Admitting office. ? Call this number if you have problems the morning of surgery: ? [phone] ? ? If you have any questions prior to your surgery date call [phone]: Open Monday-Friday 8am-4pm ? ? ? Remember: ? Do not eat or drink after midnight the night before your surgery ? ? ?  ? Take these medicines the morning of surgery with A SIP OF WATER:  ?atorvastatin (LIPITOR) ?citalopram (CELEXA)  ?levothyroxine (SYNTHROID) ?omeprazole (PRILOSEC)  ? ?AS NEEDED: ?albuterol (VENTOLIN HFA) - bring inhaler day of surgery ?Fluticasone-Salmeterol (ADVAIR DISKUS) - bring day of surgery ?azelastine (ASTELIN)  ? ?Follow your surgeon's instructions on when to stop Aspirin.  If no instructions were given by your surgeon then you will need to call the office to get those instructions.    ? ?As of today, STOP diclofenac (VOLTAREN), Aleve, Naproxen, Ibuprofen, Motrin, Advil, Goody's, BC's, all herbal medications, fish oil, and all vitamins. ? ?WHAT DO I DO ABOUT MY DIABETES MEDICATION? ? ? ?Do not take oral diabetes medicines (pills) the morning of surgery.  DO NOT take METFORMIN the morning of surgery. ? ?THE NIGHT BEFORE SURGERY, take 40 units (50%) of LANTUS insulin.     ? ?THE MORNING OF SURGERY, DO NOT take your usual dose of HUMALOG insulin. ? ?The day of surgery, do not take other diabetes injectables, including Byetta (exenatide), Bydureon (exenatide ER), Victoza (liraglutide), or Trulicity (dulaglutide). ? ?If your CBG is greater than 220 mg/dL, you may take ? of your sliding scale (correction) dose of insulin. ? ? ?HOW TO MANAGE YOUR DIABETES ?BEFORE AND AFTER SURGERY ? ?Why is it important to control my blood sugar before and after surgery? ?Improving blood sugar levels before and after surgery helps healing and can limit problems. ?A way of improving blood sugar control  is eating a healthy diet by: ? Eating less sugar and carbohydrates ? Increasing activity/exercise ? Talking with your doctor about reaching your blood sugar goals ?High blood sugars (greater than 180 mg/dL) can raise your risk of infections and slow your recovery, so you will need to focus on controlling your diabetes during the weeks before surgery. ?Make sure that the doctor who takes care of your diabetes knows about your planned surgery including the date and location. ? ?How do I manage my blood sugar before surgery? ?Check your blood sugar at least 4 times a day, starting 2 days before surgery, to make sure that the level is not too high or low. ? ?Check your blood sugar the morning of your surgery when you wake up and every 2 hours until you get to the Short Stay unit. ? ?If your blood sugar is less than 70 mg/dL, you will need to treat for low blood sugar: ?Do not take insulin. ?Treat a low blood sugar (less than 70 mg/dL) with ? cup of clear juice (cranberry or apple), 4 glucose tablets, OR glucose gel. ?Recheck blood sugar in 15 minutes after treatment (to make sure it is greater than 70 mg/dL). If your blood sugar is not greater than 70 mg/dL on recheck, call [phone] for further instructions. ?Report your blood sugar to the short stay nurse when you get to Short Stay. ? ?If you are admitted to the hospital after surgery: ?Your blood sugar will be checked by the staff and you will probably be given insulin after  surgery (instead of oral diabetes medicines) to make sure you have good blood sugar levels. ?The goal for blood sugar control after surgery is 80-180 mg/dL.  ? ?         ?Do not wear jewelry or makeup ?Do not wear lotions, powders, perfumes/colognes, or deodorant. ?Do not shave 48 hours prior to surgery.  Men may shave face and neck. ?Do not bring valuables to the hospital. ?Do not wear nail polish, gel polish, artificial nails, or any other type of covering on natural nails (fingers and  toes) ?If you have artificial nails or gel coating that need to be removed by a nail salon, please have this removed prior to surgery. Artificial nails or gel coating may interfere with anesthesia's ability to adequately monitor your vital signs. ? ?[facility] is not responsible for any belongings or valuables. .  ? ?Do NOT Smoke (Tobacco/Vaping)  24 hours prior to your procedure ? ?If you use a CPAP at night, you may bring your mask for your overnight stay. ?  ?Contacts, glasses, hearing aids, dentures or partials may not be worn into surgery, please bring cases for these belongings ?  ?For patients admitted to the hospital, discharge time will be determined by your treatment team. ?  ?Patients discharged the day of surgery will not be allowed to drive home, and someone needs to stay with them for 24 hours. ? ? ?SURGICAL WAITING ROOM VISITATION ?Patients having surgery or a procedure in a hospital may have two support people. ?Children under the age of 32 must have an adult with them who is not the patient. ?They may stay in the waiting area during the procedure and may switch out with other visitors. If the patient needs to stay at the hospital during part of their recovery, the visitor guidelines for inpatient rooms apply. ? ?Please refer to the [facility] website for the visitor guidelines for Inpatients (after your surgery is over and you are in a regular room).  ? ? ? ?Special instructions:   ? ?Oral Hygiene is also important to reduce your risk of infection.  Remember - BRUSH YOUR TEETH THE MORNING OF SURGERY WITH YOUR REGULAR TOOTHPASTE ? ? ?[facility]- Preparing For Surgery ? ?Before surgery, you can play an important role. Because skin is not sterile, your skin needs to be as free of germs as possible. You can reduce the number of germs on your skin by washing with CHG (chlorahexidine gluconate) Soap before surgery.  CHG is an antiseptic cleaner which kills germs and bonds with the skin to continue  killing germs even after washing.   ? ? ?Please do not use if you have an allergy to CHG or antibacterial soaps. If your skin becomes reddened/irritated stop using the CHG.  ?Do not shave (including legs and underarms) for at least 48 hours prior to first CHG shower. It is OK to shave your face. ? ?Please follow these instructions carefully. ?  ? ? Shower the NIGHT BEFORE SURGERY and the MORNING OF SURGERY with CHG Soap.  ? If you chose to wash your hair, wash your hair first as usual with your normal shampoo. After you shampoo, rinse your hair and body thoroughly to remove the shampoo.  Then [employer] and genitals (private parts) with your normal soap and rinse thoroughly to remove soap. ? ?After that Use CHG Soap as you would any other liquid soap. You can apply CHG directly to the skin and wash gently with a scrungie or  a clean washcloth.  ? ?Apply the CHG Soap to your body ONLY FROM THE NECK DOWN.  Do not use on open wounds or open sores. Avoid contact with your eyes, ears, mouth and genitals (private parts). Wash Face and genitals (private parts)  with your normal soap.  ? ?Wash thoroughly, paying special attention to the area where your surgery will be performed. ? ?Thoroughly rinse your body with warm water from the neck down. ? ?DO NOT shower/wash with your normal soap after using and rinsing off the CHG Soap. ? ?Pat yourself dry with a CLEAN TOWEL. ? ?Wear CLEAN PAJAMAS to bed the night before surgery ? ?Place CLEAN SHEETS on your bed the night before your surgery ? ?DO NOT SLEEP WITH PETS. ? ? ?Day of Surgery: ? ?Take a shower with CHG soap. ?Wear Clean/Comfortable clothing the morning of surgery ?Do not apply any deodorants/lotions.   ?Remember to brush your teeth WITH YOUR REGULAR TOOTHPASTE. ? ? ? ?If you received a COVID test during your pre-op visit  it is requested that you wear a mask when out in public, stay away from anyone that may not be feeling well and notify your surgeon if you develop  symptoms. If you have been in contact with anyone that has tested positive in the last 10 days please notify you surgeon. ? ?  ?Please read over the following fact sheets that you were given.   ?

## 2022-02-02 ENCOUNTER — Other Ambulatory Visit: Payer: Self-pay

## 2022-02-02 ENCOUNTER — Ambulatory Visit (HOSPITAL_COMMUNITY)
Admission: RE | Admit: 2022-02-02 | Discharge: 2022-02-02 | Disposition: A | Discharge status: Home / Self Care | Payer: HMO | Source: Ambulatory Visit | Attending: Surgery | Admitting: Surgery

## 2022-02-02 ENCOUNTER — Encounter (HOSPITAL_COMMUNITY)
Admission: RE | Admit: 2022-02-02 | Discharge: 2022-02-02 | Disposition: A | Discharge status: Home / Self Care | Payer: HMO | Source: Ambulatory Visit | Attending: Surgery | Admitting: Surgery

## 2022-02-02 ENCOUNTER — Encounter (HOSPITAL_COMMUNITY): Payer: Self-pay

## 2022-02-02 VITALS — BP 118/79 | HR 75 | Temp 98.1°F | Resp 18 | Ht 65.0 in | Wt 203.8 lb

## 2022-02-02 DIAGNOSIS — I350 Nonrheumatic aortic (valve) stenosis: Secondary | ICD-10-CM

## 2022-02-02 DIAGNOSIS — Z01818 Encounter for other preprocedural examination: Secondary | ICD-10-CM | POA: Insufficient documentation

## 2022-02-02 DIAGNOSIS — I10 Essential (primary) hypertension: Secondary | ICD-10-CM | POA: Diagnosis not present

## 2022-02-02 DIAGNOSIS — Z20822 Contact with and (suspected) exposure to covid-19: Secondary | ICD-10-CM | POA: Insufficient documentation

## 2022-02-02 DIAGNOSIS — E785 Hyperlipidemia, unspecified: Secondary | ICD-10-CM | POA: Diagnosis not present

## 2022-02-02 DIAGNOSIS — E119 Type 2 diabetes mellitus without complications: Secondary | ICD-10-CM | POA: Insufficient documentation

## 2022-02-02 DIAGNOSIS — N3000 Acute cystitis without hematuria: Secondary | ICD-10-CM

## 2022-02-02 HISTORY — DX: Other specified postprocedural states: Z98.890

## 2022-02-02 HISTORY — DX: Other specified postprocedural states: R11.2

## 2022-02-02 LAB — SURGICAL PCR SCREEN
MRSA, PCR: NEGATIVE
Staphylococcus aureus: NEGATIVE

## 2022-02-02 LAB — URINALYSIS, ROUTINE W REFLEX MICROSCOPIC
Bilirubin Urine: NEGATIVE
Glucose, UA: NEGATIVE mg/dL
Hgb urine dipstick: NEGATIVE
Ketones, ur: NEGATIVE mg/dL
Nitrite: NEGATIVE
Protein, ur: NEGATIVE mg/dL
Specific Gravity, Urine: 1.018 (ref 1.005–1.030)
pH: 5 (ref 5.0–8.0)

## 2022-02-02 LAB — BLOOD GAS, ARTERIAL
Acid-Base Excess: 0.1 mmol/L (ref 0.0–2.0)
Bicarbonate: 24.7 mmol/L (ref 20.0–28.0)
O2 Saturation: 98.6 %
Patient temperature: 37
pCO2 arterial: 39 mmHg (ref 32–48)
pH, Arterial: 7.41 (ref 7.35–7.45)
pO2, Arterial: 107 mmHg (ref 83–108)

## 2022-02-02 LAB — CBC
HCT: 36.1 % (ref 36.0–46.0)
Hemoglobin: 11.6 g/dL — ABNORMAL LOW (ref 12.0–15.0)
MCH: 25.5 pg — ABNORMAL LOW (ref 26.0–34.0)
MCHC: 32.1 g/dL (ref 30.0–36.0)
MCV: 79.3 fL — ABNORMAL LOW (ref 80.0–100.0)
Platelets: 226 10*3/uL (ref 150–400)
RBC: 4.55 MIL/uL (ref 3.87–5.11)
RDW: 12.7 % (ref 11.5–15.5)
WBC: 8.2 10*3/uL (ref 4.0–10.5)
nRBC: 0 % (ref 0.0–0.2)

## 2022-02-02 LAB — COMPREHENSIVE METABOLIC PANEL
ALT: 17 U/L (ref 0–44)
AST: 20 U/L (ref 15–41)
Albumin: 3.7 g/dL (ref 3.5–5.0)
Alkaline Phosphatase: 69 U/L (ref 38–126)
Anion gap: 10 (ref 5–15)
BUN: 18 mg/dL (ref 8–23)
CO2: 21 mmol/L — ABNORMAL LOW (ref 22–32)
Calcium: 9.2 mg/dL (ref 8.9–10.3)
Chloride: 102 mmol/L (ref 98–111)
Creatinine, Ser: 0.6 mg/dL (ref 0.44–1.00)
GFR, Estimated: >60 mL/min (ref >60)
Glucose, Bld: 281 mg/dL — ABNORMAL HIGH (ref 70–99)
Potassium: 4.4 mmol/L (ref 3.5–5.1)
Sodium: 133 mmol/L — ABNORMAL LOW (ref 135–145)
Total Bilirubin: 1.2 mg/dL (ref 0.3–1.2)
Total Protein: 6.6 g/dL (ref 6.5–8.1)

## 2022-02-02 LAB — GLUCOSE, CAPILLARY: Glucose-Capillary: 206 mg/dL — ABNORMAL HIGH (ref 70–99)

## 2022-02-02 LAB — HEMOGLOBIN A1C
Hgb A1c MFr Bld: 8.6 % — ABNORMAL HIGH (ref 4.8–5.6)
Mean Plasma Glucose: 200.12 mg/dL

## 2022-02-02 LAB — PROTIME-INR
INR: 1 (ref 0.8–1.2)
Prothrombin Time: 13.5 seconds (ref 11.4–15.2)

## 2022-02-02 LAB — APTT: aPTT: 27 seconds (ref 24–36)

## 2022-02-02 MED ORDER — CIPROFLOXACIN HCL 500 MG PO TABS: 500.0000 mg | ORAL_TABLET | Freq: Two times a day (BID) | ORAL | 0 refills | Status: DC

## 2022-02-02 NOTE — Telephone Encounter (Signed)
RX for Cipro 500 mg BID sent to pharm. X3 days. Patient is aware ?

## 2022-02-02 NOTE — Progress Notes (Signed)
Reported abnormal UA to Charlena Cross, RN with [facility].  ? ?Jacqlyn Larsen, RN ? ?

## 2022-02-02 NOTE — Progress Notes (Signed)
PCP - Dr. Stana Bunting ?Cardiologist - Dr. Harl Bowie ?Endocrinologist- Rayetta Pigg, NP ? ?PPM/ICD - n/a ? ?Chest x-ray - 02/02/22 ?EKG - 02/02/22 ?Stress Test - denies ?ECHO - 10/19/21 ?Cardiac Cath - 12/01/21 ? ?Sleep Study - OSA+, pt stated that this study was done 20+ years ago and she hasn't used a CPAP since then. She plans to be re-evaluated after this surgery is complete and she has recovered.   ? ?Fasting Blood Sugar - 179-200 ?Checks Blood Sugar 2-3 times a day ?She normally has a continuous blood glucose monitor but is waiting for her endocrinologist to fill a new prescription.  ? ?Blood Thinner Instructions: n/a ?Aspirin Instructions: continue, none on DOS ? ?NPO ? ?COVID TEST- 02/02/22 ? ?Anesthesia review: Yes  ? ?Patient denies shortness of breath, fever, cough and chest pain at PAT appointment ? ? ?All instructions explained to the patient, with a verbal understanding of the material. Patient agrees to go over the instructions while at home for a better understanding. Patient also instructed to self quarantine after being tested for COVID-19. The opportunity to ask questions was provided. ? ? ?

## 2022-02-02 NOTE — Progress Notes (Signed)
Surgical Instructions ? ? ? Your procedure is scheduled on Monday, 02/06/22. ? Report to [facility] Main Entrance "A" at 5:30 A.M., then check in with the Admitting office. ? Call this number if you have problems the morning of surgery: ? [phone] ? ? If you have any questions prior to your surgery date call [phone]: Open Monday-Friday 8am-4pm ? ? ? Remember: ? Do not eat or drink after midnight the night before your surgery ? ? ?  ? Take these medicines the morning of surgery with A SIP OF WATER:  ?atorvastatin (LIPITOR) ?citalopram (CELEXA)  ?levothyroxine (SYNTHROID) ?omeprazole (PRILOSEC)  ?Fluticasone-Salmeterol (ADVAIR DISKUS) - bring day of surgery ? ?AS NEEDED: ?albuterol (VENTOLIN HFA) - bring inhaler day of surgery ?azelastine (ASTELIN)  ?cetirizine (ZYRTEC)  ? ?Follow your surgeon's instructions on when to stop Aspirin.  If no instructions were given by your surgeon then you will need to call the office to get those instructions.    ? ?As of today, STOP diclofenac (VOLTAREN), Aleve, Naproxen, Ibuprofen, Motrin, Advil, Goody's, BC's, all herbal medications, fish oil, and all vitamins. ? ?WHAT DO I DO ABOUT MY DIABETES MEDICATION? ? ? ?DO NOT take METFORMIN the morning of surgery. ? ?THE NIGHT BEFORE SURGERY, take 40 units of LANTUS insulin.     ? ?On the MORNING of surgery, If your CBG is greater than 220 mg/dL, you may take ? of your sliding scale (correction) dose of HUMALOG insulin. ? ? ?HOW TO MANAGE YOUR DIABETES ?BEFORE AND AFTER SURGERY ? ?Why is it important to control my blood sugar before and after surgery? ?Improving blood sugar levels before and after surgery helps healing and can limit problems. ?A way of improving blood sugar control is eating a healthy diet by: ? Eating less sugar and carbohydrates ? Increasing activity/exercise ? Talking with your doctor about reaching your blood sugar goals ?High blood sugars (greater than 180 mg/dL) can raise your risk of infections and slow your  recovery, so you will need to focus on controlling your diabetes during the weeks before surgery. ?Make sure that the doctor who takes care of your diabetes knows about your planned surgery including the date and location. ? ?How do I manage my blood sugar before surgery? ?Check your blood sugar at least 4 times a day, starting 2 days before surgery, to make sure that the level is not too high or low. ? ?Check your blood sugar the morning of your surgery when you wake up and every 2 hours until you get to the Short Stay unit. ? ?If your blood sugar is less than 70 mg/dL, you will need to treat for low blood sugar: ?Do not take insulin. ?Treat a low blood sugar (less than 70 mg/dL) with ? cup of clear juice (cranberry or apple), 4 glucose tablets, OR glucose gel. ?Recheck blood sugar in 15 minutes after treatment (to make sure it is greater than 70 mg/dL). If your blood sugar is not greater than 70 mg/dL on recheck, call [phone] for further instructions. ?Report your blood sugar to the short stay nurse when you get to Short Stay. ? ?If you are admitted to the hospital after surgery: ?Your blood sugar will be checked by the staff and you will probably be given insulin after surgery (instead of oral diabetes medicines) to make sure you have good blood sugar levels. ?The goal for blood sugar control after surgery is 80-180 mg/dL.  ? ?         ?Do not  wear jewelry or makeup ?Do not wear lotions, powders, perfumes, or deodorant. ?Do not shave 48 hours prior to surgery.  ?Do not bring valuables to the hospital. ?Do not wear nail polish, gel polish, artificial nails, or any other type of covering on natural nails (fingers and toes) ?If you have artificial nails or gel coating that need to be removed by a nail salon, please have this removed prior to surgery. Artificial nails or gel coating may interfere with anesthesia's ability to adequately monitor your vital signs. ? ?[facility] is not responsible for any  belongings or valuables. .  ? ?Do NOT Smoke (Tobacco/Vaping)  24 hours prior to your procedure ? ?If you use a CPAP at night, you may bring your mask for your overnight stay. ?  ?Contacts, glasses, hearing aids, dentures or partials may not be worn into surgery, please bring cases for these belongings ?  ?For patients admitted to the hospital, discharge time will be determined by your treatment team. ?  ?Patients discharged the day of surgery will not be allowed to drive home, and someone needs to stay with them for 24 hours. ? ? ?SURGICAL WAITING ROOM VISITATION ?Patients having surgery or a procedure in a hospital may have two support people. ?Children under the age of 74 must have an adult with them who is not the patient. ?They may stay in the waiting area during the procedure and may switch out with other visitors. If the patient needs to stay at the hospital during part of their recovery, the visitor guidelines for inpatient rooms apply. ? ?Please refer to the [facility] website for the visitor guidelines for Inpatients (after your surgery is over and you are in a regular room).  ? ? ? ?Special instructions:   ? ?Oral Hygiene is also important to reduce your risk of infection.  Remember - BRUSH YOUR TEETH THE MORNING OF SURGERY WITH YOUR REGULAR TOOTHPASTE ? ? ?[facility]- Preparing For Surgery ? ?Before surgery, you can play an important role. Because skin is not sterile, your skin needs to be as free of germs as possible. You can reduce the number of germs on your skin by washing with CHG (chlorahexidine gluconate) Soap before surgery.  CHG is an antiseptic cleaner which kills germs and bonds with the skin to continue killing germs even after washing.   ? ? ?Please do not use if you have an allergy to CHG or antibacterial soaps. If your skin becomes reddened/irritated stop using the CHG.  ?Do not shave (including legs and underarms) for at least 48 hours prior to first CHG shower. It is OK to shave your  face. ? ?Please follow these instructions carefully. ?  ? ? Shower the NIGHT BEFORE SURGERY and the MORNING OF SURGERY with CHG Soap.  ? If you chose to wash your hair, wash your hair first as usual with your normal shampoo. After you shampoo, rinse your hair and body thoroughly to remove the shampoo.  Then [employer] and genitals (private parts) with your normal soap and rinse thoroughly to remove soap. ? ?After that Use CHG Soap as you would any other liquid soap. You can apply CHG directly to the skin and wash gently with a scrungie or a clean washcloth.  ? ?Apply the CHG Soap to your body ONLY FROM THE NECK DOWN.  Do not use on open wounds or open sores. Avoid contact with your eyes, ears, mouth and genitals (private parts). Wash Face and genitals (private parts)  with your normal soap.  ? ?Wash thoroughly, paying special attention to the area where your surgery will be performed. ? ?Thoroughly rinse your body with warm water from the neck down. ? ?DO NOT shower/wash with your normal soap after using and rinsing off the CHG Soap. ? ?Pat yourself dry with a CLEAN TOWEL. ? ?Wear CLEAN PAJAMAS to bed the night before surgery ? ?Place CLEAN SHEETS on your bed the night before your surgery ? ?DO NOT SLEEP WITH PETS. ? ? ?Day of Surgery: ? ?Take a shower with CHG soap. ?Wear Clean/Comfortable clothing the morning of surgery ?Do not apply any deodorants/lotions.   ?Remember to brush your teeth WITH YOUR REGULAR TOOTHPASTE. ? ? ? ?If you received a COVID test during your pre-op visit  it is requested that you wear a mask when out in public, stay away from anyone that may not be feeling well and notify your surgeon if you develop symptoms. If you have been in contact with anyone that has tested positive in the last 10 days please notify you surgeon. ? ?  ?Please read over the following fact sheets that you were given.   ?

## 2022-02-02 NOTE — Progress Notes (Signed)
Pre cabg has been completed.  ? ?Preliminary results in CV Proc.  ? ?Shae Augello Rheagan Nayak ?02/02/2022 1:24 PM    ?

## 2022-02-03 LAB — SARS CORONAVIRUS 2 (TAT 6-24 HRS): SARS Coronavirus 2: NEGATIVE

## 2022-02-03 MED ORDER — POTASSIUM CHLORIDE 2 MEQ/ML IV SOLN
80.0000 meq | INTRAVENOUS | Status: DC
  Filled 2022-02-03: qty 40

## 2022-02-03 MED ORDER — NOREPINEPHRINE 4 MG/250ML-% IV SOLN
0.0000 ug/min | INTRAVENOUS | Status: DC
  Filled 2022-02-03: qty 250

## 2022-02-03 MED ORDER — TRANEXAMIC ACID 1000 MG/10ML IV SOLN
1.5000 mg/kg/h | INTRAVENOUS | Status: AC
  Administered 2022-02-06: 1.5 mg/kg/h via INTRAVENOUS
  Filled 2022-02-03: qty 25

## 2022-02-03 MED ORDER — CEFAZOLIN SODIUM-DEXTROSE 2-4 GM/100ML-% IV SOLN
2.0000 g | INTRAVENOUS | Status: AC
  Administered 2022-02-06: 2 g via INTRAVENOUS
  Filled 2022-02-03: qty 100

## 2022-02-03 MED ORDER — PHENYLEPHRINE HCL-NACL 20-0.9 MG/250ML-% IV SOLN
30.0000 ug/min | INTRAVENOUS | Status: AC
  Administered 2022-02-06: 40 ug/min via INTRAVENOUS
  Filled 2022-02-03: qty 250

## 2022-02-03 MED ORDER — CEFAZOLIN SODIUM-DEXTROSE 2-4 GM/100ML-% IV SOLN
2.0000 g | INTRAVENOUS | Status: DC
  Filled 2022-02-03: qty 100

## 2022-02-03 MED ORDER — MILRINONE LACTATE IN DEXTROSE 20-5 MG/100ML-% IV SOLN
0.3000 ug/kg/min | INTRAVENOUS | Status: DC
  Filled 2022-02-03: qty 100

## 2022-02-03 MED ORDER — TRANEXAMIC ACID (OHS) BOLUS VIA INFUSION
15.0000 mg/kg | INTRAVENOUS | Status: AC
  Administered 2022-02-06: 1386 mg via INTRAVENOUS
  Filled 2022-02-03: qty 1386

## 2022-02-03 MED ORDER — DEXMEDETOMIDINE HCL IN NACL 400 MCG/100ML IV SOLN
0.1000 ug/kg/h | INTRAVENOUS | Status: AC
  Administered 2022-02-06: .4 ug/kg/h via INTRAVENOUS
  Filled 2022-02-03: qty 100

## 2022-02-03 MED ORDER — TRANEXAMIC ACID (OHS) PUMP PRIME SOLUTION
2.0000 mg/kg | INTRAVENOUS | Status: DC
  Filled 2022-02-03: qty 1.85

## 2022-02-03 MED ORDER — PLASMA-LYTE A IV SOLN
INTRAVENOUS | Status: DC
  Filled 2022-02-03: qty 2.5

## 2022-02-03 MED ORDER — VANCOMYCIN HCL 1500 MG/300ML IV SOLN
1500.0000 mg | INTRAVENOUS | Status: AC
  Administered 2022-02-06: 1500 mg via INTRAVENOUS
  Filled 2022-02-03: qty 300

## 2022-02-03 MED ORDER — HEPARIN 30,000 UNITS/1000 ML (OHS) CELLSAVER SOLUTION
Status: DC
Start: 2022-02-06 — End: 2022-02-06
  Filled 2022-02-03: qty 1000

## 2022-02-03 MED ORDER — NITROGLYCERIN IN D5W 200-5 MCG/ML-% IV SOLN
2.0000 ug/min | INTRAVENOUS | Status: AC
  Administered 2022-02-06: 16.6 ug/min via INTRAVENOUS
  Filled 2022-02-03: qty 250

## 2022-02-03 MED ORDER — INSULIN REGULAR(HUMAN) IN NACL 100-0.9 UT/100ML-% IV SOLN
INTRAVENOUS | Status: AC
Start: 2022-02-06 — End: 2022-02-06
  Administered 2022-02-06: 7 [IU]/h via INTRAVENOUS
  Filled 2022-02-03: qty 100

## 2022-02-03 MED ORDER — MANNITOL 20 % IV SOLN
INTRAVENOUS | Status: DC
  Filled 2022-02-03: qty 13

## 2022-02-03 MED ORDER — EPINEPHRINE HCL 5 MG/250ML IV SOLN IN NS
0.0000 ug/min | INTRAVENOUS | Status: DC
  Filled 2022-02-03: qty 250

## 2022-02-05 NOTE — H&P (Signed)
? ? ?   ?[address] ?      [place] [address] ?            [phone]   ? ?  ?Cardiothoracic Surgery Admission History and Physical ? ?PCP is Marshall Nation, MD  ?Referring Provider is Lauree Chandler, MD  ?Primary Cardiologist is Carlyle Dolly, MD  ? ?Reason for consultation: Severe aortic stenosis  ? ?HPI:  ?The patient is a 66 year old woman with a history of type 2 diabetes, hypertension, hyperlipidemia, hypothyroidism, obesity, and severe aortic stenosis that has been followed by Dr. Harl Bowie who was admitted in early January 2023 with acute hypoxemic respiratory failure that was felt to be due to an asthma exacerbation. She presented with cough, shortness of breath, and wheezing. She improved with IV steroids and bronchodilators and transient use of oxygen she is successfully weaned off oxygen and sent home. Her echocardiogram on 10/19/2021 had shown a trileaflet aortic valve that was heavily calcified with a mean gradient of 28.7 mmHg and a peak gradient of 54 mmHg. Valve area by VTI was 0.57 cm?. Left ventricular ejection fraction was 55 to 60%. Stroke-volume index was 27. She underwent cardiac catheterization on 12/01/2021 in anticipation of needing aortic valve replacement. This showed no angiographic evidence of coronary disease with normal right heart pressures.  ?She is here today with her aunt. She continues to report exertional fatigue and shortness of breath as well as some substernal chest tightness with exertion. She has had dizzy spells. She has had some peripheral edema.  ? ?    ?Past Medical History:  ?Diagnosis Date  ? Acute respiratory failure with hypercapnia (HCC)   ? Allergy   ? Anxiety   ? Asthma   ? Bronchitis with bronchospasm   ? Cancer [facility])   ? skin  ? Depression   ? DM type 2 (diabetes mellitus, type 2) ([facility])   ? FATTY LIVER DISEASE 08/05/2008  ? GERD (gastroesophageal reflux disease)   ? Hyperlipidemia   ? Hypertension   ? Hypothyroidism   ? Kidney  stones   ? Leukocytosis   ? Obesity, Class III, BMI 40-49.9 (morbid obesity) ([facility])   ? PERSONAL HX COLONIC POLYPS 09/01/2008  ? Seasonal allergies   ? TRANSAMINASES, SERUM, ELEVATED 08/05/2008  ? ?     ?Past Surgical History:  ?Procedure Laterality Date  ? COLONOSCOPY  2013  ? Repeat 10 years  ? [place]  ? realignment  ? NOSE SURGERY  2007  ? sinus infections  ? POLYPECTOMY    ? RIGHT HEART CATH AND CORONARY ANGIOGRAPHY N/A 12/01/2021  ? Procedure: RIGHT HEART CATH AND CORONARY ANGIOGRAPHY; Surgeon: Burnell Blanks, MD; Location: [facility]; Service: Cardiovascular; Laterality: N/A;  ? skin cancer removal    ? TONSILLECTOMY    ? ?     ?Family History  ?Problem Relation Age of Onset  ? Cancer Mother   ? Hypertension Mother   ? Thyroid disease Mother   ? Breast cancer Mother   ? Diabetes Mother   ? Prostate cancer Father   ? Heart attack Maternal Grandmother   ? Allergies Daughter   ? Colon cancer Neg Hx   ? Esophageal cancer Neg Hx   ? Rectal cancer Neg Hx   ? Stomach cancer Neg Hx   ? Colon polyps Neg Hx   ? ?Social History  ? ?     ?Socioeconomic History  ? Marital status: Divorced  ?  Spouse name: Not on file  ? Number of children: Not on file  ? Years of education: Not on file  ? Highest education level: Not on file  ?Occupational History  ? Occupation: Building control surveyor at [employer]  ?Tobacco Use  ? Smoking status: Former  ?  Packs/day: 0.50  ?  Years: 5.00  ?  Pack years: 2.50  ?  Types: Cigarettes  ?  Quit date: 10/23/1982  ?  Years since quitting: 39.2  ? Smokeless tobacco: Never  ?Vaping Use  ? Vaping Use: Never used  ?Substance and Sexual Activity  ? Alcohol use: No  ? Drug use: No  ? Sexual activity: Not on file  ?Other Topics Concern  ? Not on file  ?Social History Narrative  ? Not on file  ? ?Social Determinants of Health  ? ?Financial Resource Strain: Not on file  ?Food Insecurity: Not on file  ?Transportation Needs: Not on file  ?Physical Activity: Not on file  ?Stress: Not on  file  ?Social Connections: Not on file  ?Intimate Partner Violence: Not on file  ? ?       ?Prior to Admission medications   ?Medication Sig Start Date End Date Taking? Authorizing Provider  ?albuterol (VENTOLIN HFA) 108 (90 Base) MCG/ACT inhaler INHALE TWO PUFFS INTO THE LUNGS EVERY 6 HOURS AS NEEDED FOR WHEEZING OR SHORTNESS OF BREATH  ?Patient taking differently: Inhale 2 puffs into the lungs every 6 (six) hours as needed for shortness of breath. 05/18/21  Yes Lovena Le, Malena M, DO  ?aspirin EC 81 MG tablet Take 81 mg by mouth daily.   Yes [provider]  ?atorvastatin (LIPITOR) 20 MG tablet Take 1 tablet (20 mg total) by mouth daily. 04/18/21  Yes Lovena Le, Malena M, DO  ?azelastine (ASTELIN) 0.1 % nasal spray Place 1 spray into both nostrils 2 (two) times daily. Use in each nostril as directed   Yes [provider]  ?azithromycin (ZITHROMAX Z-PAK) 250 MG tablet 2 po day one, then 1 daily x 4 days 12/03/21  Yes Milton Ferguson, MD  ?cetirizine (ZYRTEC) 10 MG tablet Take 10 mg by mouth daily as needed for allergies.   Yes [provider]  ?ciclopirox (PENLAC) 8 % solution Apply topically at bedtime. Apply over nail and surrounding skin. Apply daily over previous coat. After seven (7) days, may remove with alcohol and continue cycle. 02/22/21  Yes Trula Slade, DPM  ?citalopram (CELEXA) 40 MG tablet Take 1 tablet (40 mg total) by mouth daily. 04/18/21  Yes Elvia Collum M, DO  ?Continuous Blood Gluc Receiver (FREESTYLE LIBRE 14 DAY READER) DEVI USE AS DIRECTED THREE TIMES DAILY. 09/15/20  Yes Elvia Collum M, DO  ?Continuous Blood Gluc Sensor (FREESTYLE LIBRE 14 DAY SENSOR) MISC USE AS DIRECTED THREE TIMES DAILY WITH SLIDING SCALE INSULIN USE. 09/15/20  Yes Lovena Le, Malena M, DO  ?diclofenac (VOLTAREN) 75 MG EC tablet TAKE ONE TABLET BY MOUTH TWICE daily PRN pain. TAKE WITH FOOD. 04/18/21  Yes Lovena Le, Malena M, DO  ?doxycycline (VIBRA-TABS) 100 MG tablet Take 100 mg by mouth 2 (two) times  daily.   Yes [provider]  ?Fluticasone-Salmeterol (ADVAIR DISKUS) 250-50 MCG/DOSE AEPB Inhale 1 puff into the lungs 2 (two) times daily. 08/16/20  Yes Lovena Le, Malena M, DO  ?hydrochlorothiazide (HYDRODIURIL) 12.5 MG tablet Take 1 tablet (12.5 mg total) by mouth daily. 04/18/21  Yes Lovena Le, Malena M, DO  ?insulin glargine (LANTUS) 100 unit/mL SOPN Inject 80 Units into the skin daily.  04/18/21  Yes Lovena Le, Malena M, DO  ?insulin lispro (HUMALOG) 100 UNIT/ML injection Inject 0.12-0.18 mLs (12-18 Units total) into the skin 3 (three) times daily with meals.  ?Patient taking differently: Inject 12-18 Units into the skin 3 (three) times daily with meals. Sliding scale if needed 08/01/21  Yes Reardon, Juanetta Beets, NP  ?levothyroxine (SYNTHROID) 175 MCG tablet Take 1 tablet (175 mcg total) by mouth daily. 04/18/21  Yes Lovena Le, Malena M, DO  ?losartan (COZAAR) 50 MG tablet Take 1 tablet (50 mg total) by mouth daily. Needs labs. 04/18/21  Yes Lovena Le, Malena M, DO  ?metFORMIN (GLUCOPHAGE) 1000 MG tablet Take 1 tablet (1,000 mg total) by mouth 2 (two) times daily with a meal. 04/18/21  Yes Lovena Le, Malena M, DO  ?omeprazole (PRILOSEC) 40 MG capsule Take 1 capsule (40 mg total) by mouth daily. 04/18/21  Yes Elvia Collum M, DO  ? ?      ?Current Outpatient Medications  ?Medication Sig Dispense Refill  ? albuterol (VENTOLIN HFA) 108 (90 Base) MCG/ACT inhaler INHALE TWO PUFFS INTO THE LUNGS EVERY 6 HOURS AS NEEDED FOR WHEEZING OR SHORTNESS OF BREATH (Patient taking differently: Inhale 2 puffs into the lungs every 6 (six) hours as needed for shortness of breath.) 18 g 2  ? aspirin EC 81 MG tablet Take 81 mg by mouth daily.    ? atorvastatin (LIPITOR) 20 MG tablet Take 1 tablet (20 mg total) by mouth daily. 90 tablet 1  ? azelastine (ASTELIN) 0.1 % nasal spray Place 1 spray into both nostrils 2 (two) times daily. Use in each nostril as directed    ? azithromycin (ZITHROMAX Z-PAK) 250 MG tablet 2 po day one, then 1 daily x 4 days  6 tablet 0  ? cetirizine (ZYRTEC) 10 MG tablet Take 10 mg by mouth daily as needed for allergies.    ? ciclopirox (PENLAC) 8 % solution Apply topically at bedtime. Apply over nail and surrounding skin. Ap

## 2022-02-06 ENCOUNTER — Inpatient Hospital Stay (HOSPITAL_COMMUNITY): Payer: HMO

## 2022-02-06 ENCOUNTER — Inpatient Hospital Stay (HOSPITAL_COMMUNITY)
Admission: RE | Admit: 2022-02-06 | Discharge: 2022-02-11 | DRG: 221 | Disposition: A | Discharge status: Home / Self Care | Payer: HMO | Attending: Surgery | Admitting: Surgery

## 2022-02-06 ENCOUNTER — Inpatient Hospital Stay (HOSPITAL_COMMUNITY): Payer: HMO | Admitting: Physician Assistant

## 2022-02-06 ENCOUNTER — Other Ambulatory Visit: Payer: Self-pay

## 2022-02-06 ENCOUNTER — Inpatient Hospital Stay (HOSPITAL_COMMUNITY): Payer: HMO | Admitting: Certified Registered Nurse Anesthetist

## 2022-02-06 ENCOUNTER — Encounter (HOSPITAL_COMMUNITY)
Admission: RE | Disposition: A | Discharge status: Home / Self Care | Payer: Self-pay | Source: Home / Self Care | Attending: Surgery

## 2022-02-06 ENCOUNTER — Encounter (HOSPITAL_COMMUNITY): Payer: Self-pay | Admitting: Surgery

## 2022-02-06 DIAGNOSIS — I10 Essential (primary) hypertension: Secondary | ICD-10-CM | POA: Diagnosis present

## 2022-02-06 DIAGNOSIS — Z79890 Hormone replacement therapy (postmenopausal): Secondary | ICD-10-CM

## 2022-02-06 DIAGNOSIS — Z794 Long term (current) use of insulin: Secondary | ICD-10-CM

## 2022-02-06 DIAGNOSIS — I358 Other nonrheumatic aortic valve disorders: Secondary | ICD-10-CM | POA: Diagnosis not present

## 2022-02-06 DIAGNOSIS — E119 Type 2 diabetes mellitus without complications: Secondary | ICD-10-CM

## 2022-02-06 DIAGNOSIS — Z952 Presence of prosthetic heart valve: Secondary | ICD-10-CM | POA: Diagnosis not present

## 2022-02-06 DIAGNOSIS — E1165 Type 2 diabetes mellitus with hyperglycemia: Secondary | ICD-10-CM | POA: Diagnosis not present

## 2022-02-06 DIAGNOSIS — E8770 Fluid overload, unspecified: Secondary | ICD-10-CM | POA: Diagnosis not present

## 2022-02-06 DIAGNOSIS — Z833 Family history of diabetes mellitus: Secondary | ICD-10-CM | POA: Diagnosis not present

## 2022-02-06 DIAGNOSIS — Z7982 Long term (current) use of aspirin: Secondary | ICD-10-CM | POA: Diagnosis not present

## 2022-02-06 DIAGNOSIS — Z8249 Family history of ischemic heart disease and other diseases of the circulatory system: Secondary | ICD-10-CM

## 2022-02-06 DIAGNOSIS — Z7984 Long term (current) use of oral hypoglycemic drugs: Secondary | ICD-10-CM

## 2022-02-06 DIAGNOSIS — Z87891 Personal history of nicotine dependence: Secondary | ICD-10-CM | POA: Diagnosis not present

## 2022-02-06 DIAGNOSIS — J811 Chronic pulmonary edema: Secondary | ICD-10-CM | POA: Diagnosis not present

## 2022-02-06 DIAGNOSIS — Z79899 Other long term (current) drug therapy: Secondary | ICD-10-CM

## 2022-02-06 DIAGNOSIS — I350 Nonrheumatic aortic (valve) stenosis: Secondary | ICD-10-CM

## 2022-02-06 DIAGNOSIS — Z6832 Body mass index (BMI) 32.0-32.9, adult: Secondary | ICD-10-CM

## 2022-02-06 DIAGNOSIS — Z23 Encounter for immunization: Secondary | ICD-10-CM | POA: Diagnosis not present

## 2022-02-06 DIAGNOSIS — E785 Hyperlipidemia, unspecified: Secondary | ICD-10-CM | POA: Diagnosis not present

## 2022-02-06 DIAGNOSIS — Z803 Family history of malignant neoplasm of breast: Secondary | ICD-10-CM

## 2022-02-06 DIAGNOSIS — K5900 Constipation, unspecified: Secondary | ICD-10-CM | POA: Diagnosis not present

## 2022-02-06 DIAGNOSIS — J9811 Atelectasis: Secondary | ICD-10-CM | POA: Diagnosis not present

## 2022-02-06 DIAGNOSIS — E669 Obesity, unspecified: Secondary | ICD-10-CM | POA: Diagnosis present

## 2022-02-06 DIAGNOSIS — Z8042 Family history of malignant neoplasm of prostate: Secondary | ICD-10-CM

## 2022-02-06 DIAGNOSIS — R918 Other nonspecific abnormal finding of lung field: Secondary | ICD-10-CM | POA: Diagnosis not present

## 2022-02-06 DIAGNOSIS — Z8349 Family history of other endocrine, nutritional and metabolic diseases: Secondary | ICD-10-CM | POA: Diagnosis not present

## 2022-02-06 DIAGNOSIS — E039 Hypothyroidism, unspecified: Secondary | ICD-10-CM | POA: Diagnosis not present

## 2022-02-06 DIAGNOSIS — I080 Rheumatic disorders of both mitral and aortic valves: Secondary | ICD-10-CM | POA: Diagnosis not present

## 2022-02-06 DIAGNOSIS — J45909 Unspecified asthma, uncomplicated: Secondary | ICD-10-CM | POA: Diagnosis not present

## 2022-02-06 DIAGNOSIS — J90 Pleural effusion, not elsewhere classified: Secondary | ICD-10-CM | POA: Diagnosis not present

## 2022-02-06 HISTORY — PX: TEE WITHOUT CARDIOVERSION: SHX5443

## 2022-02-06 HISTORY — PX: AORTIC VALVE REPLACEMENT: SHX41

## 2022-02-06 LAB — POCT I-STAT 7, (LYTES, BLD GAS, ICA,H+H)
Acid-Base Excess: 2 mmol/L (ref 0.0–2.0)
Acid-Base Excess: 4 mmol/L — ABNORMAL HIGH (ref 0.0–2.0)
Acid-Base Excess: 4 mmol/L — ABNORMAL HIGH (ref 0.0–2.0)
Acid-Base Excess: 3 mmol/L — ABNORMAL HIGH (ref 0.0–2.0)
Acid-Base Excess: 4 mmol/L — ABNORMAL HIGH (ref 0.0–2.0)
Acid-base deficit: 2 mmol/L (ref 0.0–2.0)
Acid-base deficit: 3 mmol/L — ABNORMAL HIGH (ref 0.0–2.0)
Acid-base deficit: 1 mmol/L (ref 0.0–2.0)
Bicarbonate: 27.4 mmol/L (ref 20.0–28.0)
Bicarbonate: 27.7 mmol/L (ref 20.0–28.0)
Bicarbonate: 27.6 mmol/L (ref 20.0–28.0)
Bicarbonate: 27.5 mmol/L (ref 20.0–28.0)
Bicarbonate: 28.6 mmol/L — ABNORMAL HIGH (ref 20.0–28.0)
Bicarbonate: 24.2 mmol/L (ref 20.0–28.0)
Bicarbonate: 24.1 mmol/L (ref 20.0–28.0)
Bicarbonate: 25.2 mmol/L (ref 20.0–28.0)
Calcium, Ion: 1.27 mmol/L (ref 1.15–1.40)
Calcium, Ion: 0.95 mmol/L — ABNORMAL LOW (ref 1.15–1.40)
Calcium, Ion: 1.05 mmol/L — ABNORMAL LOW (ref 1.15–1.40)
Calcium, Ion: 1.09 mmol/L — ABNORMAL LOW (ref 1.15–1.40)
Calcium, Ion: 1.1 mmol/L — ABNORMAL LOW (ref 1.15–1.40)
Calcium, Ion: 1.17 mmol/L (ref 1.15–1.40)
Calcium, Ion: 1.17 mmol/L (ref 1.15–1.40)
Calcium, Ion: 1.16 mmol/L (ref 1.15–1.40)
HCT: 30 % — ABNORMAL LOW (ref 36.0–46.0)
HCT: 20 % — ABNORMAL LOW (ref 36.0–46.0)
HCT: 20 % — ABNORMAL LOW (ref 36.0–46.0)
HCT: 20 % — ABNORMAL LOW (ref 36.0–46.0)
HCT: 21 % — ABNORMAL LOW (ref 36.0–46.0)
HCT: 30 % — ABNORMAL LOW (ref 36.0–46.0)
HCT: 27 % — ABNORMAL LOW (ref 36.0–46.0)
HCT: 28 % — ABNORMAL LOW (ref 36.0–46.0)
Hemoglobin: 10.2 g/dL — ABNORMAL LOW (ref 12.0–15.0)
Hemoglobin: 6.8 g/dL — CL (ref 12.0–15.0)
Hemoglobin: 6.8 g/dL — CL (ref 12.0–15.0)
Hemoglobin: 6.8 g/dL — CL (ref 12.0–15.0)
Hemoglobin: 7.1 g/dL — ABNORMAL LOW (ref 12.0–15.0)
Hemoglobin: 10.2 g/dL — ABNORMAL LOW (ref 12.0–15.0)
Hemoglobin: 9.2 g/dL — ABNORMAL LOW (ref 12.0–15.0)
Hemoglobin: 9.5 g/dL — ABNORMAL LOW (ref 12.0–15.0)
O2 Saturation: 100 %
O2 Saturation: 100 %
O2 Saturation: 100 %
O2 Saturation: 100 %
O2 Saturation: 100 %
O2 Saturation: 93 %
O2 Saturation: 97 %
O2 Saturation: 95 %
Patient temperature: 35.9
Patient temperature: 36.7
Patient temperature: 36.9
Potassium: 4.3 mmol/L (ref 3.5–5.1)
Potassium: 5 mmol/L (ref 3.5–5.1)
Potassium: 4.8 mmol/L (ref 3.5–5.1)
Potassium: 4.4 mmol/L (ref 3.5–5.1)
Potassium: 4.8 mmol/L (ref 3.5–5.1)
Potassium: 3.9 mmol/L (ref 3.5–5.1)
Potassium: 5.3 mmol/L — ABNORMAL HIGH (ref 3.5–5.1)
Potassium: 4.7 mmol/L (ref 3.5–5.1)
Sodium: 137 mmol/L (ref 135–145)
Sodium: 138 mmol/L (ref 135–145)
Sodium: 137 mmol/L (ref 135–145)
Sodium: 137 mmol/L (ref 135–145)
Sodium: 138 mmol/L (ref 135–145)
Sodium: 139 mmol/L (ref 135–145)
Sodium: 139 mmol/L (ref 135–145)
Sodium: 138 mmol/L (ref 135–145)
TCO2: 29 mmol/L (ref 22–32)
TCO2: 29 mmol/L (ref 22–32)
TCO2: 29 mmol/L (ref 22–32)
TCO2: 29 mmol/L (ref 22–32)
TCO2: 30 mmol/L (ref 22–32)
TCO2: 26 mmol/L (ref 22–32)
TCO2: 26 mmol/L (ref 22–32)
TCO2: 27 mmol/L (ref 22–32)
pCO2 arterial: 46 mmHg (ref 32–48)
pCO2 arterial: 36.7 mmHg (ref 32–48)
pCO2 arterial: 37.1 mmHg (ref 32–48)
pCO2 arterial: 42.7 mmHg (ref 32–48)
pCO2 arterial: 43 mmHg (ref 32–48)
pCO2 arterial: 46.3 mmHg (ref 32–48)
pCO2 arterial: 50.5 mmHg — ABNORMAL HIGH (ref 32–48)
pCO2 arterial: 50.2 mmHg — ABNORMAL HIGH (ref 32–48)
pH, Arterial: 7.383 (ref 7.35–7.45)
pH, Arterial: 7.486 — ABNORMAL HIGH (ref 7.35–7.45)
pH, Arterial: 7.479 — ABNORMAL HIGH (ref 7.35–7.45)
pH, Arterial: 7.417 (ref 7.35–7.45)
pH, Arterial: 7.431 (ref 7.35–7.45)
pH, Arterial: 7.321 — ABNORMAL LOW (ref 7.35–7.45)
pH, Arterial: 7.286 — ABNORMAL LOW (ref 7.35–7.45)
pH, Arterial: 7.308 — ABNORMAL LOW (ref 7.35–7.45)
pO2, Arterial: 377 mmHg — ABNORMAL HIGH (ref 83–108)
pO2, Arterial: 446 mmHg — ABNORMAL HIGH (ref 83–108)
pO2, Arterial: 415 mmHg — ABNORMAL HIGH (ref 83–108)
pO2, Arterial: 357 mmHg — ABNORMAL HIGH (ref 83–108)
pO2, Arterial: 401 mmHg — ABNORMAL HIGH (ref 83–108)
pO2, Arterial: 69 mmHg — ABNORMAL LOW (ref 83–108)
pO2, Arterial: 97 mmHg (ref 83–108)
pO2, Arterial: 82 mmHg — ABNORMAL LOW (ref 83–108)

## 2022-02-06 LAB — HEMOGLOBIN AND HEMATOCRIT, BLOOD
HCT: 20.5 % — ABNORMAL LOW (ref 36.0–46.0)
Hemoglobin: 6.8 g/dL — CL (ref 12.0–15.0)

## 2022-02-06 LAB — POCT I-STAT, CHEM 8
BUN: 14 mg/dL (ref 8–23)
BUN: 13 mg/dL (ref 8–23)
BUN: 13 mg/dL (ref 8–23)
BUN: 13 mg/dL (ref 8–23)
BUN: 13 mg/dL (ref 8–23)
Calcium, Ion: 1.26 mmol/L (ref 1.15–1.40)
Calcium, Ion: 1.23 mmol/L (ref 1.15–1.40)
Calcium, Ion: 1.09 mmol/L — ABNORMAL LOW (ref 1.15–1.40)
Calcium, Ion: 1.1 mmol/L — ABNORMAL LOW (ref 1.15–1.40)
Calcium, Ion: 1.08 mmol/L — ABNORMAL LOW (ref 1.15–1.40)
Chloride: 101 mmol/L (ref 98–111)
Chloride: 101 mmol/L (ref 98–111)
Chloride: 100 mmol/L (ref 98–111)
Chloride: 101 mmol/L (ref 98–111)
Chloride: 101 mmol/L (ref 98–111)
Creatinine, Ser: 0.5 mg/dL (ref 0.44–1.00)
Creatinine, Ser: 0.5 mg/dL (ref 0.44–1.00)
Creatinine, Ser: 0.4 mg/dL — ABNORMAL LOW (ref 0.44–1.00)
Creatinine, Ser: 0.4 mg/dL — ABNORMAL LOW (ref 0.44–1.00)
Creatinine, Ser: 0.4 mg/dL — ABNORMAL LOW (ref 0.44–1.00)
Glucose, Bld: 180 mg/dL — ABNORMAL HIGH (ref 70–99)
Glucose, Bld: 187 mg/dL — ABNORMAL HIGH (ref 70–99)
Glucose, Bld: 125 mg/dL — ABNORMAL HIGH (ref 70–99)
Glucose, Bld: 139 mg/dL — ABNORMAL HIGH (ref 70–99)
Glucose, Bld: 168 mg/dL — ABNORMAL HIGH (ref 70–99)
HCT: 30 % — ABNORMAL LOW (ref 36.0–46.0)
HCT: 28 % — ABNORMAL LOW (ref 36.0–46.0)
HCT: 20 % — ABNORMAL LOW (ref 36.0–46.0)
HCT: 21 % — ABNORMAL LOW (ref 36.0–46.0)
HCT: 24 % — ABNORMAL LOW (ref 36.0–46.0)
Hemoglobin: 10.2 g/dL — ABNORMAL LOW (ref 12.0–15.0)
Hemoglobin: 9.5 g/dL — ABNORMAL LOW (ref 12.0–15.0)
Hemoglobin: 6.8 g/dL — CL (ref 12.0–15.0)
Hemoglobin: 7.1 g/dL — ABNORMAL LOW (ref 12.0–15.0)
Hemoglobin: 8.2 g/dL — ABNORMAL LOW (ref 12.0–15.0)
Potassium: 4.3 mmol/L (ref 3.5–5.1)
Potassium: 4.2 mmol/L (ref 3.5–5.1)
Potassium: 4.3 mmol/L (ref 3.5–5.1)
Potassium: 4.6 mmol/L (ref 3.5–5.1)
Potassium: 4.4 mmol/L (ref 3.5–5.1)
Sodium: 138 mmol/L (ref 135–145)
Sodium: 137 mmol/L (ref 135–145)
Sodium: 137 mmol/L (ref 135–145)
Sodium: 139 mmol/L (ref 135–145)
Sodium: 137 mmol/L (ref 135–145)
TCO2: 28 mmol/L (ref 22–32)
TCO2: 26 mmol/L (ref 22–32)
TCO2: 28 mmol/L (ref 22–32)
TCO2: 29 mmol/L (ref 22–32)
TCO2: 27 mmol/L (ref 22–32)

## 2022-02-06 LAB — CBC
HCT: 29.7 % — ABNORMAL LOW (ref 36.0–46.0)
HCT: 28.4 % — ABNORMAL LOW (ref 36.0–46.0)
Hemoglobin: 9.8 g/dL — ABNORMAL LOW (ref 12.0–15.0)
Hemoglobin: 9.2 g/dL — ABNORMAL LOW (ref 12.0–15.0)
MCH: 26.8 pg (ref 26.0–34.0)
MCH: 26.4 pg (ref 26.0–34.0)
MCHC: 33 g/dL (ref 30.0–36.0)
MCHC: 32.4 g/dL (ref 30.0–36.0)
MCV: 81.4 fL (ref 80.0–100.0)
MCV: 81.4 fL (ref 80.0–100.0)
Platelets: 150 10*3/uL (ref 150–400)
Platelets: 150 10*3/uL (ref 150–400)
RBC: 3.65 MIL/uL — ABNORMAL LOW (ref 3.87–5.11)
RBC: 3.49 MIL/uL — ABNORMAL LOW (ref 3.87–5.11)
RDW: 13.2 % (ref 11.5–15.5)
RDW: 13.2 % (ref 11.5–15.5)
WBC: 14.6 10*3/uL — ABNORMAL HIGH (ref 4.0–10.5)
WBC: 12 10*3/uL — ABNORMAL HIGH (ref 4.0–10.5)
nRBC: 0 % (ref 0.0–0.2)
nRBC: 0 % (ref 0.0–0.2)

## 2022-02-06 LAB — POCT I-STAT EG7
Acid-Base Excess: 2 mmol/L (ref 0.0–2.0)
Bicarbonate: 26.8 mmol/L (ref 20.0–28.0)
Calcium, Ion: 1.09 mmol/L — ABNORMAL LOW (ref 1.15–1.40)
HCT: 22 % — ABNORMAL LOW (ref 36.0–46.0)
Hemoglobin: 7.5 g/dL — ABNORMAL LOW (ref 12.0–15.0)
O2 Saturation: 83 %
Potassium: 4.5 mmol/L (ref 3.5–5.1)
Sodium: 138 mmol/L (ref 135–145)
TCO2: 28 mmol/L (ref 22–32)
pCO2, Ven: 39.5 mmHg — ABNORMAL LOW (ref 44–60)
pH, Ven: 7.44 — ABNORMAL HIGH (ref 7.25–7.43)
pO2, Ven: 46 mmHg — ABNORMAL HIGH (ref 32–45)

## 2022-02-06 LAB — GLUCOSE, CAPILLARY
Glucose-Capillary: 180 mg/dL — ABNORMAL HIGH (ref 70–99)
Glucose-Capillary: 100 mg/dL — ABNORMAL HIGH (ref 70–99)
Glucose-Capillary: 135 mg/dL — ABNORMAL HIGH (ref 70–99)
Glucose-Capillary: 116 mg/dL — ABNORMAL HIGH (ref 70–99)
Glucose-Capillary: 124 mg/dL — ABNORMAL HIGH (ref 70–99)
Glucose-Capillary: 112 mg/dL — ABNORMAL HIGH (ref 70–99)
Glucose-Capillary: 143 mg/dL — ABNORMAL HIGH (ref 70–99)
Glucose-Capillary: 135 mg/dL — ABNORMAL HIGH (ref 70–99)
Glucose-Capillary: 129 mg/dL — ABNORMAL HIGH (ref 70–99)
Glucose-Capillary: 139 mg/dL — ABNORMAL HIGH (ref 70–99)

## 2022-02-06 LAB — PROTIME-INR
INR: 1.5 — ABNORMAL HIGH (ref 0.8–1.2)
Prothrombin Time: 17.7 seconds — ABNORMAL HIGH (ref 11.4–15.2)

## 2022-02-06 LAB — PREPARE RBC (CROSSMATCH)

## 2022-02-06 LAB — TYPE AND SCREEN
ABO/RH(D): A POS
Antibody Screen: NEGATIVE

## 2022-02-06 LAB — BASIC METABOLIC PANEL
Anion gap: 4 — ABNORMAL LOW (ref 5–15)
BUN: 11 mg/dL (ref 8–23)
CO2: 26 mmol/L (ref 22–32)
Calcium: 7.5 mg/dL — ABNORMAL LOW (ref 8.9–10.3)
Chloride: 108 mmol/L (ref 98–111)
Creatinine, Ser: 0.65 mg/dL (ref 0.44–1.00)
GFR, Estimated: >60 mL/min (ref >60)
Glucose, Bld: 117 mg/dL — ABNORMAL HIGH (ref 70–99)
Potassium: 5 mmol/L (ref 3.5–5.1)
Sodium: 138 mmol/L (ref 135–145)

## 2022-02-06 LAB — MAGNESIUM: Magnesium: 2.7 mg/dL — ABNORMAL HIGH (ref 1.7–2.4)

## 2022-02-06 LAB — ABO/RH: ABO/RH(D): A POS

## 2022-02-06 LAB — APTT: aPTT: 36 seconds (ref 24–36)

## 2022-02-06 LAB — PLATELET COUNT: Platelets: 128 10*3/uL — ABNORMAL LOW (ref 150–400)

## 2022-02-06 SURGERY — REPLACEMENT, AORTIC VALVE, OPEN
Anesthesia: General | Site: Chest

## 2022-02-06 MED ORDER — ONDANSETRON HCL 4 MG/2ML IJ SOLN
INTRAMUSCULAR | Status: DC | PRN
  Administered 2022-02-06: 4 mg via INTRAVENOUS

## 2022-02-06 MED ORDER — ROCURONIUM BROMIDE 10 MG/ML (PF) SYRINGE
PREFILLED_SYRINGE | INTRAVENOUS | Status: DC | PRN
  Administered 2022-02-06: 80 mg via INTRAVENOUS
  Administered 2022-02-06: 50 mg via INTRAVENOUS
  Administered 2022-02-06: 20 mg via INTRAVENOUS
  Administered 2022-02-06: 50 mg via INTRAVENOUS

## 2022-02-06 MED ORDER — PROPOFOL 10 MG/ML IV BOLUS
INTRAVENOUS | Status: DC | PRN
  Administered 2022-02-06: 100 mg via INTRAVENOUS

## 2022-02-06 MED ORDER — DEXAMETHASONE SODIUM PHOSPHATE 10 MG/ML IJ SOLN
INTRAMUSCULAR | Status: AC
  Filled 2022-02-06: qty 1

## 2022-02-06 MED ORDER — LACTATED RINGERS IV SOLN: INTRAVENOUS | Status: DC

## 2022-02-06 MED ORDER — CHLORHEXIDINE GLUCONATE 4 % EX LIQD: 30.0000 mL | CUTANEOUS | Status: DC

## 2022-02-06 MED ORDER — CHLORHEXIDINE GLUCONATE 0.12 % MT SOLN
15.0000 mL | OROMUCOSAL | Status: AC
  Administered 2022-02-06: 15 mL via OROMUCOSAL

## 2022-02-06 MED ORDER — FENTANYL CITRATE (PF) 250 MCG/5ML IJ SOLN
INTRAMUSCULAR | Status: AC
  Filled 2022-02-06: qty 5

## 2022-02-06 MED ORDER — DEXTROSE 50 % IV SOLN: 0.0000 mL | INTRAVENOUS | Status: DC | PRN

## 2022-02-06 MED ORDER — PROPOFOL 10 MG/ML IV BOLUS
INTRAVENOUS | Status: AC
  Filled 2022-02-06: qty 20

## 2022-02-06 MED ORDER — SODIUM CHLORIDE 0.9 % IV SOLN: INTRAVENOUS | Status: DC | PRN

## 2022-02-06 MED ORDER — ONDANSETRON HCL 4 MG/2ML IJ SOLN
4.0000 mg | Freq: Four times a day (QID) | INTRAMUSCULAR | Status: DC | PRN
  Administered 2022-02-06 – 2022-02-10 (×4): 4 mg via INTRAVENOUS
  Filled 2022-02-06 (×4): qty 2

## 2022-02-06 MED ORDER — CHLORHEXIDINE GLUCONATE CLOTH 2 % EX PADS
6.0000 | MEDICATED_PAD | Freq: Every day | CUTANEOUS | Status: DC
  Administered 2022-02-06 – 2022-02-07 (×2): 6 via TOPICAL

## 2022-02-06 MED ORDER — FENTANYL CITRATE (PF) 250 MCG/5ML IJ SOLN
INTRAMUSCULAR | Status: DC | PRN
  Administered 2022-02-06: 50 ug via INTRAVENOUS
  Administered 2022-02-06: 450 ug via INTRAVENOUS
  Administered 2022-02-06: 50 ug via INTRAVENOUS
  Administered 2022-02-06: 500 ug via INTRAVENOUS
  Administered 2022-02-06 (×2): 50 ug via INTRAVENOUS
  Administered 2022-02-06: 100 ug via INTRAVENOUS

## 2022-02-06 MED ORDER — ROCURONIUM BROMIDE 10 MG/ML (PF) SYRINGE
PREFILLED_SYRINGE | INTRAVENOUS | Status: AC
  Filled 2022-02-06: qty 20

## 2022-02-06 MED ORDER — LACTATED RINGERS IV SOLN: INTRAVENOUS | Status: DC | PRN

## 2022-02-06 MED ORDER — THROMBIN (RECOMBINANT) 20000 UNITS EX SOLR
CUTANEOUS | Status: AC
  Filled 2022-02-06: qty 20000

## 2022-02-06 MED ORDER — CHLORHEXIDINE GLUCONATE 0.12 % MT SOLN: 15.0000 mL | Freq: Once | OROMUCOSAL | Status: DC

## 2022-02-06 MED ORDER — MIDAZOLAM HCL (PF) 5 MG/ML IJ SOLN
INTRAMUSCULAR | Status: DC | PRN
  Administered 2022-02-06: 5 mg via INTRAVENOUS
  Administered 2022-02-06 (×2): 1 mg via INTRAVENOUS

## 2022-02-06 MED ORDER — DEXAMETHASONE SODIUM PHOSPHATE 10 MG/ML IJ SOLN
INTRAMUSCULAR | Status: DC | PRN
  Administered 2022-02-06: 4 mg via INTRAVENOUS

## 2022-02-06 MED ORDER — METOPROLOL TARTRATE 25 MG/10 ML ORAL SUSPENSION: 12.5000 mg | Freq: Two times a day (BID) | ORAL | Status: DC

## 2022-02-06 MED ORDER — ACETAMINOPHEN 650 MG RE SUPP
650.0000 mg | Freq: Once | RECTAL | Status: AC
  Administered 2022-02-06: 650 mg via RECTAL

## 2022-02-06 MED ORDER — MORPHINE SULFATE (PF) 2 MG/ML IV SOLN
1.0000 mg | INTRAVENOUS | Status: DC | PRN
  Administered 2022-02-06 – 2022-02-07 (×2): 2 mg via INTRAVENOUS
  Filled 2022-02-06 (×2): qty 1

## 2022-02-06 MED ORDER — METOPROLOL TARTRATE 12.5 MG HALF TABLET: 12.5000 mg | ORAL_TABLET | Freq: Two times a day (BID) | ORAL | Status: DC

## 2022-02-06 MED ORDER — SODIUM CHLORIDE 0.9 % IV SOLN: INTRAVENOUS | Status: DC

## 2022-02-06 MED ORDER — MOMETASONE FURO-FORMOTEROL FUM 200-5 MCG/ACT IN AERO
2.0000 | INHALATION_SPRAY | Freq: Two times a day (BID) | RESPIRATORY_TRACT | Status: DC
Start: 2022-02-06 — End: 2022-02-11
  Administered 2022-02-06 – 2022-02-11 (×10): 2 via RESPIRATORY_TRACT
  Filled 2022-02-06: qty 8.8

## 2022-02-06 MED ORDER — HEPARIN SODIUM (PORCINE) 1000 UNIT/ML IJ SOLN
INTRAMUSCULAR | Status: DC | PRN
  Administered 2022-02-06: 32000 [IU] via INTRAVENOUS

## 2022-02-06 MED ORDER — ALBUMIN HUMAN 5 % IV SOLN
INTRAVENOUS | Status: DC | PRN
Start: 2022-02-06 — End: 2022-02-06

## 2022-02-06 MED ORDER — OXYCODONE HCL 5 MG PO TABS
5.0000 mg | ORAL_TABLET | ORAL | Status: DC | PRN
  Administered 2022-02-07: 5 mg via ORAL
  Administered 2022-02-07: 10 mg via ORAL
  Administered 2022-02-07 – 2022-02-08 (×2): 5 mg via ORAL
  Administered 2022-02-08 – 2022-02-09 (×2): 10 mg via ORAL
  Filled 2022-02-06: qty 2
  Filled 2022-02-06: qty 1
  Filled 2022-02-06 (×2): qty 2
  Filled 2022-02-06: qty 1

## 2022-02-06 MED ORDER — SODIUM CHLORIDE 0.9% FLUSH
3.0000 mL | Freq: Two times a day (BID) | INTRAVENOUS | Status: DC
  Administered 2022-02-07 – 2022-02-08 (×2): 3 mL via INTRAVENOUS

## 2022-02-06 MED ORDER — HEPARIN SODIUM (PORCINE) 1000 UNIT/ML IJ SOLN
INTRAMUSCULAR | Status: AC
  Filled 2022-02-06: qty 10

## 2022-02-06 MED ORDER — ALBUTEROL SULFATE (2.5 MG/3ML) 0.083% IN NEBU
2.5000 mg | INHALATION_SOLUTION | Freq: Four times a day (QID) | RESPIRATORY_TRACT | Status: DC | PRN
  Filled 2022-02-06: qty 3

## 2022-02-06 MED ORDER — MAGNESIUM SULFATE 4 GM/100ML IV SOLN
4.0000 g | Freq: Once | INTRAVENOUS | Status: AC
  Administered 2022-02-06: 4 g via INTRAVENOUS

## 2022-02-06 MED ORDER — PANTOPRAZOLE SODIUM 40 MG PO TBEC
40.0000 mg | DELAYED_RELEASE_TABLET | Freq: Every day | ORAL | Status: DC
Start: 2022-02-06 — End: 2022-02-06

## 2022-02-06 MED ORDER — SODIUM CHLORIDE 0.9 % IV SOLN: 250.0000 mL | INTRAVENOUS | Status: DC

## 2022-02-06 MED ORDER — ORAL CARE MOUTH RINSE: 15.0000 mL | Freq: Once | OROMUCOSAL | Status: AC

## 2022-02-06 MED ORDER — MIDAZOLAM HCL (PF) 10 MG/2ML IJ SOLN
INTRAMUSCULAR | Status: AC
  Filled 2022-02-06: qty 2

## 2022-02-06 MED ORDER — THROMBIN 20000 UNITS EX SOLR
OROMUCOSAL | Status: DC | PRN
  Administered 2022-02-06: 12 mL via TOPICAL

## 2022-02-06 MED ORDER — ACETAMINOPHEN 160 MG/5ML PO SOLN: 1000.0000 mg | Freq: Four times a day (QID) | ORAL | Status: DC

## 2022-02-06 MED ORDER — DOCUSATE SODIUM 100 MG PO CAPS
200.0000 mg | ORAL_CAPSULE | Freq: Every day | ORAL | Status: DC
  Administered 2022-02-07 – 2022-02-08 (×2): 200 mg via ORAL
  Filled 2022-02-06 (×2): qty 2

## 2022-02-06 MED ORDER — ALBUTEROL SULFATE HFA 108 (90 BASE) MCG/ACT IN AERS: 2.0000 | INHALATION_SPRAY | Freq: Four times a day (QID) | RESPIRATORY_TRACT | Status: DC | PRN

## 2022-02-06 MED ORDER — 0.9 % SODIUM CHLORIDE (POUR BTL) OPTIME
TOPICAL | Status: DC | PRN
  Administered 2022-02-06: 6000 mL

## 2022-02-06 MED ORDER — INSULIN REGULAR(HUMAN) IN NACL 100-0.9 UT/100ML-% IV SOLN
INTRAVENOUS | Status: DC
  Administered 2022-02-07: 1.1 [IU]/h via INTRAVENOUS
  Filled 2022-02-06: qty 100

## 2022-02-06 MED ORDER — DEXMEDETOMIDINE HCL IN NACL 400 MCG/100ML IV SOLN
0.0000 ug/kg/h | INTRAVENOUS | Status: DC
  Administered 2022-02-06: 0.4 ug/kg/h via INTRAVENOUS

## 2022-02-06 MED ORDER — FAMOTIDINE IN NACL 20-0.9 MG/50ML-% IV SOLN
INTRAVENOUS | Status: AC
Start: 2022-02-06 — End: 2022-02-06
  Filled 2022-02-06: qty 50

## 2022-02-06 MED ORDER — THROMBIN 20000 UNITS EX SOLR: CUTANEOUS | Status: DC | PRN

## 2022-02-06 MED ORDER — FAMOTIDINE IN NACL 20-0.9 MG/50ML-% IV SOLN
20.0000 mg | Freq: Two times a day (BID) | INTRAVENOUS | Status: AC
  Administered 2022-02-06 (×2): 20 mg via INTRAVENOUS
  Filled 2022-02-06: qty 50

## 2022-02-06 MED ORDER — HEPARIN SODIUM (PORCINE) 1000 UNIT/ML IJ SOLN
INTRAMUSCULAR | Status: AC
  Filled 2022-02-06: qty 1

## 2022-02-06 MED ORDER — PLASMA-LYTE A IV SOLN
INTRAVENOUS | Status: DC | PRN
  Administered 2022-02-06: 500 mL via INTRAVASCULAR

## 2022-02-06 MED ORDER — SODIUM CHLORIDE 0.45 % IV SOLN: INTRAVENOUS | Status: DC | PRN

## 2022-02-06 MED ORDER — ACETAMINOPHEN 500 MG PO TABS
1000.0000 mg | ORAL_TABLET | Freq: Four times a day (QID) | ORAL | Status: DC
  Administered 2022-02-07 – 2022-02-08 (×6): 1000 mg via ORAL
  Filled 2022-02-06 (×6): qty 2

## 2022-02-06 MED ORDER — SODIUM BICARBONATE 8.4 % IV SOLN
50.0000 meq | Freq: Once | INTRAVENOUS | Status: AC
  Administered 2022-02-06: 50 meq via INTRAVENOUS

## 2022-02-06 MED ORDER — METOCLOPRAMIDE HCL 5 MG/ML IJ SOLN
10.0000 mg | Freq: Four times a day (QID) | INTRAMUSCULAR | Status: AC
Start: 2022-02-06 — End: 2022-02-07
  Administered 2022-02-06 – 2022-02-07 (×3): 10 mg via INTRAVENOUS
  Filled 2022-02-06 (×3): qty 2

## 2022-02-06 MED ORDER — ASPIRIN 81 MG PO CHEW: 324.0000 mg | CHEWABLE_TABLET | Freq: Every day | ORAL | Status: DC

## 2022-02-06 MED ORDER — ORAL CARE MOUTH RINSE
15.0000 mL | Freq: Two times a day (BID) | OROMUCOSAL | Status: DC
  Administered 2022-02-06 – 2022-02-11 (×6): 15 mL via OROMUCOSAL

## 2022-02-06 MED ORDER — CEFAZOLIN SODIUM-DEXTROSE 2-4 GM/100ML-% IV SOLN
2.0000 g | Freq: Three times a day (TID) | INTRAVENOUS | Status: DC
  Administered 2022-02-06 – 2022-02-08 (×5): 2 g via INTRAVENOUS
  Filled 2022-02-06 (×5): qty 100

## 2022-02-06 MED ORDER — LEVOTHYROXINE SODIUM 75 MCG PO TABS
175.0000 ug | ORAL_TABLET | Freq: Every day | ORAL | Status: DC
  Administered 2022-02-07 – 2022-02-11 (×4): 175 ug via ORAL
  Filled 2022-02-06 (×4): qty 1

## 2022-02-06 MED ORDER — ARTIFICIAL TEARS OPHTHALMIC OINT
TOPICAL_OINTMENT | OPHTHALMIC | Status: DC | PRN
  Administered 2022-02-06: 1 via OPHTHALMIC

## 2022-02-06 MED ORDER — SODIUM CHLORIDE 0.9% FLUSH: 3.0000 mL | INTRAVENOUS | Status: DC | PRN

## 2022-02-06 MED ORDER — PROTAMINE SULFATE 10 MG/ML IV SOLN
INTRAVENOUS | Status: AC
  Filled 2022-02-06: qty 10

## 2022-02-06 MED ORDER — ALBUMIN HUMAN 5 % IV SOLN
250.0000 mL | INTRAVENOUS | Status: AC | PRN
  Administered 2022-02-06 (×4): 12.5 g via INTRAVENOUS
  Filled 2022-02-06: qty 250

## 2022-02-06 MED ORDER — ACETAMINOPHEN 160 MG/5ML PO SOLN: 650.0000 mg | Freq: Once | ORAL | Status: AC

## 2022-02-06 MED ORDER — CHLORHEXIDINE GLUCONATE 0.12 % MT SOLN
15.0000 mL | Freq: Once | OROMUCOSAL | Status: AC
  Administered 2022-02-06: 15 mL via OROMUCOSAL
  Filled 2022-02-06: qty 15

## 2022-02-06 MED ORDER — SODIUM CHLORIDE 0.9% FLUSH
10.0000 mL | Freq: Two times a day (BID) | INTRAVENOUS | Status: DC
  Administered 2022-02-07 – 2022-02-08 (×2): 10 mL

## 2022-02-06 MED ORDER — MAGNESIUM SULFATE 4 GM/100ML IV SOLN
INTRAVENOUS | Status: AC
  Filled 2022-02-06: qty 100

## 2022-02-06 MED ORDER — NITROGLYCERIN IN D5W 200-5 MCG/ML-% IV SOLN: 0.0000 ug/min | INTRAVENOUS | Status: DC

## 2022-02-06 MED ORDER — MIDAZOLAM HCL 2 MG/2ML IJ SOLN: 2.0000 mg | INTRAMUSCULAR | Status: DC | PRN

## 2022-02-06 MED ORDER — CITALOPRAM HYDROBROMIDE 20 MG PO TABS
40.0000 mg | ORAL_TABLET | Freq: Every day | ORAL | Status: DC
  Administered 2022-02-07 – 2022-02-11 (×5): 40 mg via ORAL
  Filled 2022-02-06 (×2): qty 2
  Filled 2022-02-06 (×2): qty 1
  Filled 2022-02-06: qty 2

## 2022-02-06 MED ORDER — AZELASTINE HCL 0.1 % NA SOLN
1.0000 | Freq: Two times a day (BID) | NASAL | Status: DC | PRN
  Filled 2022-02-06: qty 30

## 2022-02-06 MED ORDER — THROMBIN 20000 UNITS EX SOLR
CUTANEOUS | Status: DC | PRN
  Administered 2022-02-06: 20000 [IU] via TOPICAL

## 2022-02-06 MED ORDER — LACTATED RINGERS IV SOLN
500.0000 mL | Freq: Once | INTRAVENOUS | Status: DC | PRN
Start: 2022-02-06 — End: 2022-02-07

## 2022-02-06 MED ORDER — ASPIRIN EC 325 MG PO TBEC
325.0000 mg | DELAYED_RELEASE_TABLET | Freq: Every day | ORAL | Status: DC
  Administered 2022-02-07 – 2022-02-08 (×2): 325 mg via ORAL
  Filled 2022-02-06 (×2): qty 1

## 2022-02-06 MED ORDER — METOPROLOL TARTRATE 12.5 MG HALF TABLET
12.5000 mg | ORAL_TABLET | Freq: Once | ORAL | Status: AC
  Administered 2022-02-06: 12.5 mg via ORAL
  Filled 2022-02-06: qty 1

## 2022-02-06 MED ORDER — HEMOSTATIC AGENTS (NO CHARGE) OPTIME
TOPICAL | Status: DC | PRN
  Administered 2022-02-06: 1 via TOPICAL

## 2022-02-06 MED ORDER — TRAMADOL HCL 50 MG PO TABS
50.0000 mg | ORAL_TABLET | ORAL | Status: DC | PRN
  Administered 2022-02-06: 100 mg via ORAL
  Filled 2022-02-06: qty 2

## 2022-02-06 MED ORDER — METOPROLOL TARTRATE 5 MG/5ML IV SOLN: 2.5000 mg | INTRAVENOUS | Status: DC | PRN

## 2022-02-06 MED ORDER — SODIUM CHLORIDE 0.9% FLUSH: 10.0000 mL | INTRAVENOUS | Status: DC | PRN

## 2022-02-06 MED ORDER — PHENYLEPHRINE HCL-NACL 20-0.9 MG/250ML-% IV SOLN: 0.0000 ug/min | INTRAVENOUS | Status: DC

## 2022-02-06 MED ORDER — PANTOPRAZOLE SODIUM 40 MG PO TBEC
40.0000 mg | DELAYED_RELEASE_TABLET | Freq: Every day | ORAL | Status: DC
  Administered 2022-02-08 – 2022-02-11 (×4): 40 mg via ORAL
  Filled 2022-02-06 (×4): qty 1

## 2022-02-06 MED ORDER — ONDANSETRON HCL 4 MG/2ML IJ SOLN
INTRAMUSCULAR | Status: AC
  Filled 2022-02-06: qty 2

## 2022-02-06 MED ORDER — LORATADINE 10 MG PO TABS
10.0000 mg | ORAL_TABLET | Freq: Every day | ORAL | Status: DC
  Administered 2022-02-07 – 2022-02-11 (×5): 10 mg via ORAL
  Filled 2022-02-06 (×5): qty 1

## 2022-02-06 MED ORDER — PROTAMINE SULFATE 10 MG/ML IV SOLN
INTRAVENOUS | Status: DC | PRN
  Administered 2022-02-06: 320 mg via INTRAVENOUS

## 2022-02-06 MED ORDER — ~~LOC~~ CARDIAC SURGERY, PATIENT & FAMILY EDUCATION
Freq: Once | Status: DC
  Filled 2022-02-06: qty 1

## 2022-02-06 MED ORDER — BISACODYL 10 MG RE SUPP: 10.0000 mg | Freq: Every day | RECTAL | Status: DC

## 2022-02-06 MED ORDER — ATORVASTATIN CALCIUM 10 MG PO TABS
20.0000 mg | ORAL_TABLET | Freq: Every day | ORAL | Status: DC
  Administered 2022-02-07 – 2022-02-11 (×5): 20 mg via ORAL
  Filled 2022-02-06 (×5): qty 2

## 2022-02-06 MED ORDER — POTASSIUM CHLORIDE 10 MEQ/50ML IV SOLN
10.0000 meq | INTRAVENOUS | Status: AC
  Administered 2022-02-06 (×3): 10 meq via INTRAVENOUS

## 2022-02-06 MED ORDER — VANCOMYCIN HCL IN DEXTROSE 1-5 GM/200ML-% IV SOLN
1000.0000 mg | Freq: Once | INTRAVENOUS | Status: AC
  Administered 2022-02-06: 1000 mg via INTRAVENOUS
  Filled 2022-02-06: qty 200

## 2022-02-06 MED ORDER — BISACODYL 5 MG PO TBEC
10.0000 mg | DELAYED_RELEASE_TABLET | Freq: Every day | ORAL | Status: DC
  Administered 2022-02-07 – 2022-02-08 (×2): 10 mg via ORAL
  Filled 2022-02-06 (×2): qty 2

## 2022-02-06 SURGICAL SUPPLY — 74 items
ADAPTER CARDIO PERF ANTE/RETRO (ADAPTER) ×3 IMPLANT
BAG DECANTER FOR FLEXI CONT (MISCELLANEOUS) ×3 IMPLANT
BLADE CLIPPER SURG (BLADE) ×3 IMPLANT
BLADE STERNUM SYSTEM 6 (BLADE) ×3 IMPLANT
BLADE SURG 15 STRL LF DISP TIS (BLADE) ×2 IMPLANT
BLADE SURG 15 STRL SS (BLADE) ×3
CANISTER SUCT 3000ML PPV (MISCELLANEOUS) ×3 IMPLANT
CANNULA GUNDRY RCSP 15FR (MISCELLANEOUS) ×3 IMPLANT
CATH HEART VENT LEFT (CATHETERS) ×2 IMPLANT
CATH ROBINSON RED A/P 18FR (CATHETERS) ×9 IMPLANT
CATH THORACIC 36FR (CATHETERS) ×3 IMPLANT
CATH THORACIC 36FR RT ANG (CATHETERS) ×3 IMPLANT
CATH VENT ARGYLE 51 18F LT (CATHETERS) ×1 IMPLANT
CNTNR URN SCR LID CUP LEK RST (MISCELLANEOUS) ×2 IMPLANT
CONN 1/4X1/4 STERILE (MISCELLANEOUS) ×1 IMPLANT
CONT SPEC 4OZ STRL OR WHT (MISCELLANEOUS) ×3
CONTAINER PROTECT SURGISLUSH (MISCELLANEOUS) ×6 IMPLANT
COVER SURGICAL LIGHT HANDLE (MISCELLANEOUS) ×3 IMPLANT
DEVICE SUT CK QUICK LOAD MINI (Prosthesis & Implant Heart) ×1 IMPLANT
DRAPE CARDIOVASCULAR INCISE (DRAPES) ×3
DRAPE SRG 135X102X78XABS (DRAPES) ×2 IMPLANT
DRAPE WARM FLUID 44X44 (DRAPES) ×3 IMPLANT
DRSG COVADERM 4X14 (GAUZE/BANDAGES/DRESSINGS) ×3 IMPLANT
ELECT CAUTERY BLADE 6.4 (BLADE) ×3 IMPLANT
ELECT REM PT RETURN 9FT ADLT (ELECTROSURGICAL) ×6
ELECTRODE REM PT RTRN 9FT ADLT (ELECTROSURGICAL) ×4 IMPLANT
FELT TEFLON 1X6 (MISCELLANEOUS) ×6 IMPLANT
GAUZE SPONGE 4X4 12PLY STRL (GAUZE/BANDAGES/DRESSINGS) ×3 IMPLANT
GLOVE BIO SURGEON STRL SZ 6 (GLOVE) IMPLANT
GLOVE BIO SURGEON STRL SZ 6.5 (GLOVE) IMPLANT
GLOVE BIO SURGEON STRL SZ7 (GLOVE) IMPLANT
GLOVE BIO SURGEON STRL SZ7.5 (GLOVE) IMPLANT
GLOVE SURG MICRO LTX SZ7 (GLOVE) ×6 IMPLANT
GOWN STRL REUS W/ TWL LRG LVL3 (GOWN DISPOSABLE) ×8 IMPLANT
GOWN STRL REUS W/ TWL XL LVL3 (GOWN DISPOSABLE) ×2 IMPLANT
GOWN STRL REUS W/TWL LRG LVL3 (GOWN DISPOSABLE) ×12
GOWN STRL REUS W/TWL XL LVL3 (GOWN DISPOSABLE) ×3
HEMOSTAT POWDER SURGIFOAM 1G (HEMOSTASIS) ×9 IMPLANT
HEMOSTAT SURGICEL 2X14 (HEMOSTASIS) ×3 IMPLANT
KIT BASIN OR (CUSTOM PROCEDURE TRAY) ×3 IMPLANT
KIT CATH CPB BARTLE (MISCELLANEOUS) ×3 IMPLANT
KIT SUCTION CATH 14FR (SUCTIONS) ×3 IMPLANT
KIT SUT CK MINI COMBO 4X17 (Prosthesis & Implant Heart) ×1 IMPLANT
KIT TURNOVER KIT B (KITS) ×3 IMPLANT
LINE VENT (MISCELLANEOUS) ×1 IMPLANT
NS IRRIG 1000ML POUR BTL (IV SOLUTION) ×18 IMPLANT
PACK E OPEN HEART (SUTURE) ×3 IMPLANT
PACK OPEN HEART (CUSTOM PROCEDURE TRAY) ×3 IMPLANT
PAD ARMBOARD 7.5X6 YLW CONV (MISCELLANEOUS) ×6 IMPLANT
POSITIONER HEAD DONUT 9IN (MISCELLANEOUS) ×3 IMPLANT
SET MPS 3-ND DEL (MISCELLANEOUS) ×1 IMPLANT
SPONGE T-LAP 18X18 ~~LOC~~+RFID (SPONGE) ×12 IMPLANT
SUT BONE WAX W31G (SUTURE) ×3 IMPLANT
SUT EB EXC GRN/WHT 2-0 V-5 (SUTURE) ×6 IMPLANT
SUT ETHIBON EXCEL 2-0 V-5 (SUTURE) ×2 IMPLANT
SUT ETHIBOND 2 0 SH (SUTURE) ×3
SUT ETHIBOND 2 0 SH 36X2 (SUTURE) IMPLANT
SUT ETHIBOND V-5 VALVE (SUTURE) ×2 IMPLANT
SUT PROLENE 3 0 SH DA (SUTURE) ×2 IMPLANT
SUT PROLENE 3 0 SH1 36 (SUTURE) ×3 IMPLANT
SUT PROLENE 4 0 RB 1 (SUTURE) ×15
SUT PROLENE 4-0 RB1 .5 CRCL 36 (SUTURE) ×6 IMPLANT
SUT SILK 2 0 SH CR/8 (SUTURE) ×1 IMPLANT
SUT STEEL 6MS V (SUTURE) ×2 IMPLANT
SUT VIC AB 1 CTX 36 (SUTURE) ×6
SUT VIC AB 1 CTX36XBRD ANBCTR (SUTURE) ×4 IMPLANT
SYSTEM SAHARA CHEST DRAIN ATS (WOUND CARE) ×3 IMPLANT
TOWEL GREEN STERILE (TOWEL DISPOSABLE) ×3 IMPLANT
TOWEL GREEN STERILE FF (TOWEL DISPOSABLE) ×3 IMPLANT
TRAY FOLEY SLVR 14FR TEMP STAT (SET/KITS/TRAYS/PACK) ×1 IMPLANT
UNDERPAD 30X36 HEAVY ABSORB (UNDERPADS AND DIAPERS) ×3 IMPLANT
VALVE AORTIC SZ21 INSP/RESIL (Valve) ×1 IMPLANT
VENT LEFT HEART 12002 (CATHETERS) ×3
WATER STERILE IRR 1000ML POUR (IV SOLUTION) ×6 IMPLANT

## 2022-02-06 NOTE — Anesthesia Preprocedure Evaluation (Signed)
Anesthesia Evaluation  ?Patient identified by MRN, date of birth, ID band ?Patient awake ? ? ? ?Reviewed: ?Allergy & Precautions, H&P , NPO status , Patient's Chart, lab work & pertinent test results ? ?History of Anesthesia Complications ?(+) PONV and history of anesthetic complications ? ?Airway ?Mallampati: II ? ? ?Neck ROM: full ? ? ? Dental ?  ?Pulmonary ?asthma , sleep apnea , former smoker,  ?  ?breath sounds clear to auscultation ? ? ? ? ? ? Cardiovascular ?hypertension, + Valvular Problems/Murmurs AS  ?Rhythm:regular Rate:Normal ? ? ?  ?Neuro/Psych ?PSYCHIATRIC DISORDERS Anxiety Depression   ? GI/Hepatic ?GERD  ,  ?Endo/Other  ?diabetes, Type 2Hypothyroidism  ? Renal/GU ?  ? ?  ?Musculoskeletal ? ? Abdominal ?  ?Peds ? Hematology ?  ?Anesthesia Other Findings ? ? Reproductive/Obstetrics ? ?  ? ? ? ? ? ? ? ? ? ? ? ? ? ?  ?  ? ? ? ? ? ? ? ? ?Anesthesia Physical ?Anesthesia Plan ? ?ASA: 3 ? ?Anesthesia Plan: General  ? ?Post-op Pain Management:   ? ?Induction: Intravenous ? ?PONV Risk Score and Plan: 4 or greater and Ondansetron, Dexamethasone, Midazolam and Treatment may vary due to age or medical condition ? ?Airway Management Planned: Oral ETT ? ?Additional Equipment: Arterial line, CVP, PA Cath, TEE and Ultrasound Guidance Line Placement ? ?Intra-op Plan:  ? ?Post-operative Plan: Post-operative intubation/ventilation ? ?Informed Consent: I have reviewed the patients History and Physical, chart, labs and discussed the procedure including the risks, benefits and alternatives for the proposed anesthesia with the patient or authorized representative who has indicated his/her understanding and acceptance.  ? ? ? ?Dental advisory given ? ?Plan Discussed with: Anesthesiologist, CRNA and Surgeon ? ?Anesthesia Plan Comments:   ? ? ? ? ? ? ?Anesthesia Quick Evaluation ? ?

## 2022-02-06 NOTE — Op Note (Signed)
CARDIOVASCULAR SURGERY OPERATIVE NOTE ? ?02/06/2022 ?[place] ?[id number] ? ?Surgeon:  Gaye Pollack, MD ? ?First Assistant: Ellwood Handler,  PA-C:  An experienced assistant was required given the complexity of this surgery and the standard of surgical care. The assistant was needed for exposure, dissection, suctioning, retraction of delicate tissues and sutures, instrument exchange and for overall help during this procedure.  ? ? ?Preoperative Diagnosis:  Severe aortic stenosis ? ? ?Postoperative Diagnosis:  Same ? ? ?Procedure: ? ?Median Sternotomy ?Extracorporeal circulation ?3.   Aortic valve replacement using a 21 mm Edwards INSPIRIS RESILIA pericardial valve. (BSA 2.0 m2) ? ?Anesthesia:  General Endotracheal ? ? ?Clinical History/Surgical Indication: ? ?This 66 year old woman has stage D, severe, symptomatic aortic stenosis with New York Heart Association class III symptoms of exertional fatigue and shortness of breath consistent with chronic diastolic congestive heart failure. She has also had some exertional chest discomfort and dizzy spells as well as lower extremity edema. I agree that aortic valve replacement is indicated in this patient for relief of her symptoms and to prevent left ventricular dysfunction. Given her relatively young age I think that surgical aortic valve replacement would be the best option for her. I personally reviewed her 2D echocardiogram, cardiac catheterization, and CTA studies. Her echo shows a severely calcified trileaflet aortic valve with restricted leaflet mobility. Her peak velocity is 4 m/s consistent with severe aortic stenosis. Left ventricular systolic function is normal. Cardiac catheterization shows no coronary disease. Her gated cardiac CTA shows a severely thickened aortic valve with heavy calcification. The planimetry valve area is 0.9 cm? consistent with severe aortic stenosis. She has relatively narrow sinuses and borderline coronary heights for TAVR. I  reviewed all of the above studies with her and my recommendation is for open surgical aortic valve replacement using a bioprosthetic valve. She is in agreement with that. I discussed the operative procedure with the patient and family including alternatives, benefits and risks; including but not limited to bleeding, blood transfusion, infection, stroke, myocardial infarction, graft failure, heart block requiring a permanent pacemaker, organ dysfunction, and death. Sarah Pope understands and agrees to proceed.  ? ?Preparation: ? ?The patient was seen in the preoperative holding area and the correct patient, correct operation were confirmed with the patient after reviewing the medical record and catheterization. The consent was signed by me. Preoperative antibiotics were given. A pulmonary arterial line and radial arterial line were placed by the anesthesia team. The patient was taken back to the operating room and positioned supine on the operating room table. After being placed under general endotracheal anesthesia by the anesthesia team a foley catheter was placed. The neck, chest, abdomen, and both legs were prepped with betadine soap and solution and draped in the usual sterile manner. A surgical time-out was taken and the correct patient and operative procedure were confirmed with the nursing and anesthesia staff. ? ? ?Pre-bypass TEE: ?  ?Complete TEE assessment was performed by Dr. Marcie Bal. This showed severe AS with a mean gradient over 50, normal LV systolic function, small PFO with left to right shunt, trivial MR. ? ? ? ?Post-bypass TEE: ? ? ?Normal functioning prosthetic aortic valve with no perivalvular leak or regurgitation through the valve. Left ventricular function preserved. Unchanged trivial mitral regurgitation. ? ? ? ?Cardiopulmonary Bypass: ? ?A median sternotomy was performed. The pericardium was opened in the midline. Right ventricular function appeared normal. The ascending aorta was of  normal size and had no palpable plaque. There were  no contraindications to aortic cannulation or cross-clamping. The patient was fully systemically heparinized and the ACT was maintained > 400 sec. The proximal aortic arch was cannulated with a 20 F aortic cannula for arterial inflow. Venous cannulation was performed via the right atrial appendage using a two-staged venous cannula. An antegrade cardioplegia/vent cannula was inserted into the mid-ascending aorta. A left ventricular vent was placed via the right superior pulmonary vein. A retrograde cardioplegia cannnula was placed into the coronary sinus via the right atrium. Aortic occlusion was performed with a single cross-clamp. Systemic cooling to 32 degrees Centigrade and topical cooling of the heart with iced saline were used. Antegrade cold KBC cardioplegia was used to induce diastolic arrest and then war retrograde KBC cardioplegia was given prior to removing the cross clamp for reanimation. A temperature probe was inserted into the interventricular septum and an insulating pad was placed in the pericardium. Carbon dioxide was insufflated into the pericardium at 5L/min throughout the procedure to minimize intracardiac air. ? ? ?Aortic Valve Replacement:  ? ?A transverse aortotomy was performed 1 cm above the take-off of the right coronary artery. The native valve was trileaflet with calcified leaflets and mild annular calcification. The ostia of the coronary arteries were in normal position but closer to the annulus than usual and were not obstructed. The native valve leaflets were excised and the annulus was decalcified with rongeurs. Care was taken to remove all particulate debris. The left ventricle was directly inspected for debris and then irrigated with ice saline solution. The annulus was sized and a size 21 mm INSPIRIS RESILIA valve was chosen. The model number was [id number] and the serial number was [id number]. While the valve was being prepared 2-0  Ethibond pledgeted horizontal mattress sutures were placed around the annulus with the pledgets in a sub-annular position. The sutures were placed through the sewing ring and the valve lowered into place. The sutures were tied sequentially. The valve seated nicely and the coronary ostia were not obstructed. The prosthetic valve leaflets moved normally and there was no sub-valvular obstruction. The aortotomy was closed using 4-0 Prolene suture in 2 layers with felt strips to reinforce the closure. ? ?Completion: ? ?The patient was rewarmed to 37 degrees Centigrade. De-airing maneuvers were performed and the head placed in trendelenburg position. The crossclamp was removed with a time of 80 minutes. There was spontaneous return of sinus rhythm. The aortotomy was checked for hemostasis. Two temporary epicardial pacing wires were placed on the right atrium and two on the right ventricle. The left ventricular vent and retrograde cardioplegia cannulas were removed. The patient was weaned from CPB without difficulty on no inotropes. CPB time was 114 minutes. Cardiac output was 5 LPM. Heparin was fully reversed with protamine and the aortic and venous cannulas removed. Hemostasis was achieved. Mediastinal drainage tubes were placed. The sternum was closed with  #6 stainless steel wires. The fascia was closed with continuous # 1 vicryl suture. The subcutaneous tissue was closed with 2-0 vicryl continuous suture. The skin was closed with 3-0 vicryl subcuticular suture. All sponge, needle, and instrument counts were reported correct at the end of the case. Dry sterile dressings were placed over the incisions and around the chest tubes which were connected to pleurevac suction. The patient was then transported to the surgical intensive care unit in stable condition. ? ? ?  ?

## 2022-02-06 NOTE — Progress Notes (Signed)
?  Echocardiogram ?Echocardiogram Transesophageal has been performed. ? ?Sarah Pope ?02/06/2022, 10:41 AM ?

## 2022-02-06 NOTE — Hospital Course (Addendum)
History of Present Illness: ? ?The patient is a 66 year old woman with a history of type 2 diabetes, hypertension, hyperlipidemia, hypothyroidism, obesity, and severe aortic stenosis that has been followed by Dr. Harl Bowie who was admitted in early January 2023 with acute hypoxemic respiratory failure that was felt to be due to an asthma exacerbation. She presented with cough, shortness of breath, and wheezing. She improved with IV steroids and bronchodilators and transient use of oxygen. She was successfully weaned off oxygen and sent home. Her echocardiogram on 10/19/2021 had shown a trileaflet aortic valve that was heavily calcified with a mean gradient of 28.7 mmHg and a peak gradient of 54 mmHg. Valve area by VTI was 0.57 cm?. Left ventricular ejection fraction was 55 to 60%. Stroke-volume index was 27. She underwent cardiac catheterization on 12/01/2021 in anticipation of needing aortic valve replacement. This showed no angiographic evidence of coronary disease with normal right heart pressures.   She was referred to Dr. Cyndia Bent for surgical evaluation, during which time she continued to report exertional fatigue and shortness of breath as well as some substernal chest tightness with exertion. She also complained of dizzy spells and some peripheral edema.  It was felt she would benefit from replacement of her Aortic Valve.  The risks and benefits of the procedure were explained to the patient and she was agreeable to proceed. ? ?Hospital Course: ? ?The patient presented to [facility] on 02/06/2022.  She was taken to the operating room and underwent Aortic Valve Replacement with a 21 mm Edwards Resilia Bioprosthetic Valve.  She tolerated the procedure without difficulty and was taken to the SICU in stable condition.  She was extubated the evening of surgery.  She was tachycardic with hypertension.  She was started on Lopressor which was titrated as needed.  Her chest tubes, arterial lines, and swan ganz  catheter were removed without difficulty.  She was started on Lasix for volume overloaded state.  She was maintaining NSR and was stable for transfer to 4E on 02/08/2022. Epicardial pacing wires were removed on 04/20. She has been tolerating a diet but has not had a bowel movement yet. She was given a laxative. She is on 2 liters of oxygen via [place].  She has been successfully weaned off oxygen.  She is maintaining NSR.  She is ambulating without difficulty.  Her surgical incisions are healing without evidence of infection.  She is medically stable for discharge home today. ?

## 2022-02-06 NOTE — Progress Notes (Signed)
Called MD Bartle to discuss patient's extubation parameters to determine if patient appropriate for wean.  Informed MD Bartle of patient's NIF, VC, and ABG results.  MD ordered an amp of sodium bicarb and advised okay to extubate patient.   Patient extubated by RT and tolerated extubation well.   ?

## 2022-02-06 NOTE — Procedures (Signed)
Extubation Procedure Note ? ?Patient Details:   ?Name: MASSIAH LONGANECKER ?DOB: 04/13/1956 ?MRN: [id number] ?  ?Airway Documentation:  ?  ?Vent end date: 02/06/22 Vent end time: 3143  ? ?Evaluation ? O2 sats: stable throughout ?Complications: No apparent complications ?Patient did tolerate procedure well. ?Bilateral Breath Sounds: Clear, Diminished ?  ?Yes ? ?Patient was extubated to a 4L [place] without any complications, dyspnea or stridor noted. NIF: -22, VC: 700, positive cuff leak prior to extubation. Patient was instructed on IS x 5.  ? ?Claretta Fraise ?02/06/2022, 6:20 PM ? ?

## 2022-02-06 NOTE — Anesthesia Procedure Notes (Signed)
Central Venous Catheter Insertion ?Performed by: Belinda Block, MD, anesthesiologist ?Start/End4/17/2023 7:00 AM, 02/06/2022 7:15 AM ?Patient location: Pre-op. ?Preanesthetic checklist: patient identified, IV checked, site marked, risks and benefits discussed, surgical consent, monitors and equipment checked, pre-op evaluation, timeout performed and anesthesia consent ?Hand hygiene performed  and maximum sterile barriers used  ?PA cath was placed.Swan type:thermodilution ?Procedure performed without using ultrasound guided technique. ?Attempts: 1 ?Patient tolerated the procedure well with no immediate complications. ? ? ? ?

## 2022-02-06 NOTE — Transfer of Care (Signed)
Immediate Anesthesia Transfer of Care Note ? ?Patient: ANALYSE ANGST ? ?Procedure(s) Performed: AORTIC VALVE REPLACEMENT (AVR) WITH 21 MM INSIPIRIS AORTIC VALVE. (Chest) ?TRANSESOPHAGEAL ECHOCARDIOGRAM (TEE) ? ?Patient Location: SICU ? ?Anesthesia Type:General ? ?Level of Consciousness: sedated and Patient remains intubated per anesthesia plan ? ?Airway & Oxygen Therapy: Patient placed on Ventilator (see vital sign flow sheet for setting) ? ?Post-op Assessment: Report given to RN and Post -op Vital signs reviewed and stable ? ?Post vital signs: Reviewed and stable ? ?Last Vitals:  ?Vitals Value Taken Time  ?BP    ?Temp    ?Pulse 80 02/06/22 1240  ?Resp 12 02/06/22 1240  ?SpO2 91 % 02/06/22 1240  ?Vitals shown include unvalidated device data. ? ?Last Pain:  ?Vitals:  ? 02/06/22 0624  ?TempSrc:   ?PainSc: 0-No pain  ?   ? ?  ? ?Complications: No notable events documented. ?

## 2022-02-06 NOTE — Anesthesia Procedure Notes (Signed)
Central Venous Catheter Insertion ?Performed by: Belinda Block, MD, anesthesiologist ?Start/End4/17/2023 7:00 AM, 02/06/2022 7:15 AM ?Patient location: Pre-op. ?Preanesthetic checklist: patient identified, IV checked, site marked, risks and benefits discussed, surgical consent, monitors and equipment checked, pre-op evaluation, timeout performed and anesthesia consent ?Lidocaine 1% used for infiltration and patient sedated ?Hand hygiene performed  and maximum sterile barriers used  ?Catheter size: 8.5 Fr ?Sheath introducer ?Procedure performed using ultrasound guided technique. ?Ultrasound Notes:anatomy identified, needle tip was noted to be adjacent to the nerve/plexus identified, no ultrasound evidence of intravascular and/or intraneural injection and image(s) printed for medical record ?Attempts: 1 ?Following insertion, line sutured and dressing applied. ?Post procedure assessment: blood return through all ports, free fluid flow and no air ? ?Patient tolerated the procedure well with no immediate complications. ? ? ? ? ?

## 2022-02-06 NOTE — Progress Notes (Signed)
Patient ID: Sarah Pope, female   DOB: 26-Feb-1956, 66 y.o.   MRN: [id number] ? ?TCTS Evening Rounds:  ? ?Hemodynamically stable  ?CI = 1.98 ? ?Weaning on vent ? ?Urine output good  ?CT output low ? ?CBC ?   ?Component Value Date/Time  ? WBC 14.6 (H) 02/06/2022 1234  ? RBC 3.65 (L) 02/06/2022 1234  ? HGB 10.2 (L) 02/06/2022 1255  ? HGB 12.8 04/18/2021 0943  ? HCT 30.0 (L) 02/06/2022 1255  ? HCT 40.0 04/18/2021 0943  ? PLT 150 02/06/2022 1234  ? PLT 267 04/18/2021 0943  ? MCV 81.4 02/06/2022 1234  ? MCV 80 04/18/2021 0943  ? MCH 26.8 02/06/2022 1234  ? MCHC 33.0 02/06/2022 1234  ? RDW 13.2 02/06/2022 1234  ? RDW 13.3 04/18/2021 0943  ? LYMPHSABS 2.2 12/03/2021 1552  ? MONOABS 0.7 12/03/2021 1552  ? EOSABS 0.9 (H) 12/03/2021 1552  ? BASOSABS 0.1 12/03/2021 1552  ? ? ? ?BMET ?   ?Component Value Date/Time  ? NA 139 02/06/2022 1255  ? NA 141 04/18/2021 0943  ? K 3.9 02/06/2022 1255  ? CL 101 02/06/2022 1126  ? CO2 21 (L) 02/02/2022 1450  ? GLUCOSE 168 (H) 02/06/2022 1126  ? BUN 13 02/06/2022 1126  ? BUN 14 04/18/2021 0943  ? CREATININE 0.40 (L) 02/06/2022 1126  ? CREATININE 0.64 02/05/2013 1148  ? CALCIUM 9.2 02/02/2022 1450  ? EGFR 78 04/18/2021 0943  ? GFRNONAA >60 02/02/2022 1450  ? ? ? ?A/P:  Stable postop course. Continue current plans ? ?

## 2022-02-06 NOTE — Anesthesia Procedure Notes (Signed)
Procedure Name: Intubation ?Date/Time: 02/06/2022 7:51 AM ?Performed by: Leonor Liv, CRNA ?Pre-anesthesia Checklist: Patient identified, Emergency Drugs available, Suction available and Patient being monitored ?Patient Re-evaluated:Patient Re-evaluated prior to induction ?Oxygen Delivery Method: Circle System Utilized ?Preoxygenation: Pre-oxygenation with 100% oxygen ?Induction Type: IV induction ?Ventilation: Mask ventilation without difficulty ?Laryngoscope Size: Mac and 3 ?Grade View: Grade I ?Tube type: Oral ?Tube size: 8.0 mm ?Number of attempts: 1 ?Airway Equipment and Method: Stylet and Oral airway ?Placement Confirmation: ETT inserted through vocal cords under direct vision, positive ETCO2 and breath sounds checked- equal and bilateral ?Secured at: 21 cm ?Tube secured with: Tape ?Dental Injury: Teeth and Oropharynx as per pre-operative assessment  ? ? ? ? ?

## 2022-02-06 NOTE — Anesthesia Procedure Notes (Signed)
Arterial Line Insertion ?Start/End4/17/2023 7:00 AM, 02/06/2022 7:20 AM ?Performed by: Leonor Liv, CRNA, CRNA ? Patient location: Pre-op. ?Preanesthetic checklist: patient identified, IV checked, site marked, risks and benefits discussed, surgical consent, monitors and equipment checked, pre-op evaluation, timeout performed and anesthesia consent ?Lidocaine 1% used for infiltration and patient sedated ?Left, radial was placed ?Catheter size: 20 G ?Hand hygiene performed  and maximum sterile barriers used  ?Allen's test indicative of satisfactory collateral circulation ?Attempts: 2 ?Procedure performed without using ultrasound guided technique. ?Following insertion, dressing applied and Biopatch. ?Patient tolerated the procedure well with no immediate complications. ? ? ?

## 2022-02-06 NOTE — Interval H&P Note (Signed)
History and Physical Interval Note: ? ?02/06/2022 ?7:07 AM ? ?Sarah Pope  has presented today for surgery, with the diagnosis of SEVERE AS.  The various methods of treatment have been discussed with the patient and family. After consideration of risks, benefits and other options for treatment, the patient has consented to  Procedure(s): ?AORTIC VALVE REPLACEMENT (AVR) (N/A) ?TRANSESOPHAGEAL ECHOCARDIOGRAM (TEE) (N/A) as a surgical intervention.  The patient's history has been reviewed, patient examined, no change in status, stable for surgery.  I have reviewed the patient's chart and labs.  Questions were answered to the patient's satisfaction.   ? ? ?Gaye Pollack ? ? ?

## 2022-02-07 ENCOUNTER — Other Ambulatory Visit: Payer: Self-pay | Admitting: Cardiology

## 2022-02-07 ENCOUNTER — Other Ambulatory Visit: Payer: Self-pay

## 2022-02-07 ENCOUNTER — Inpatient Hospital Stay (HOSPITAL_COMMUNITY): Payer: HMO

## 2022-02-07 ENCOUNTER — Encounter (HOSPITAL_COMMUNITY): Payer: Self-pay | Admitting: Surgery

## 2022-02-07 DIAGNOSIS — I350 Nonrheumatic aortic (valve) stenosis: Secondary | ICD-10-CM

## 2022-02-07 LAB — BASIC METABOLIC PANEL
Anion gap: 9 (ref 5–15)
Anion gap: 5 (ref 5–15)
BUN: 12 mg/dL (ref 8–23)
BUN: 13 mg/dL (ref 8–23)
CO2: 24 mmol/L (ref 22–32)
CO2: 27 mmol/L (ref 22–32)
Calcium: 7.7 mg/dL — ABNORMAL LOW (ref 8.9–10.3)
Calcium: 8.2 mg/dL — ABNORMAL LOW (ref 8.9–10.3)
Chloride: 102 mmol/L (ref 98–111)
Chloride: 104 mmol/L (ref 98–111)
Creatinine, Ser: 0.8 mg/dL (ref 0.44–1.00)
Creatinine, Ser: 0.75 mg/dL (ref 0.44–1.00)
GFR, Estimated: >60 mL/min (ref >60)
GFR, Estimated: >60 mL/min (ref >60)
Glucose, Bld: 114 mg/dL — ABNORMAL HIGH (ref 70–99)
Glucose, Bld: 237 mg/dL — ABNORMAL HIGH (ref 70–99)
Potassium: 4.4 mmol/L (ref 3.5–5.1)
Potassium: 4.6 mmol/L (ref 3.5–5.1)
Sodium: 135 mmol/L (ref 135–145)
Sodium: 136 mmol/L (ref 135–145)

## 2022-02-07 LAB — GLUCOSE, CAPILLARY
Glucose-Capillary: 110 mg/dL — ABNORMAL HIGH (ref 70–99)
Glucose-Capillary: 114 mg/dL — ABNORMAL HIGH (ref 70–99)
Glucose-Capillary: 119 mg/dL — ABNORMAL HIGH (ref 70–99)
Glucose-Capillary: 121 mg/dL — ABNORMAL HIGH (ref 70–99)
Glucose-Capillary: 129 mg/dL — ABNORMAL HIGH (ref 70–99)
Glucose-Capillary: 132 mg/dL — ABNORMAL HIGH (ref 70–99)
Glucose-Capillary: 133 mg/dL — ABNORMAL HIGH (ref 70–99)
Glucose-Capillary: 134 mg/dL — ABNORMAL HIGH (ref 70–99)
Glucose-Capillary: 146 mg/dL — ABNORMAL HIGH (ref 70–99)
Glucose-Capillary: 149 mg/dL — ABNORMAL HIGH (ref 70–99)
Glucose-Capillary: 153 mg/dL — ABNORMAL HIGH (ref 70–99)
Glucose-Capillary: 216 mg/dL — ABNORMAL HIGH (ref 70–99)

## 2022-02-07 LAB — CBC
HCT: 28.5 % — ABNORMAL LOW (ref 36.0–46.0)
HCT: 29.4 % — ABNORMAL LOW (ref 36.0–46.0)
Hemoglobin: 9.3 g/dL — ABNORMAL LOW (ref 12.0–15.0)
Hemoglobin: 9.6 g/dL — ABNORMAL LOW (ref 12.0–15.0)
MCH: 26.6 pg (ref 26.0–34.0)
MCH: 26.7 pg (ref 26.0–34.0)
MCHC: 32.6 g/dL (ref 30.0–36.0)
MCHC: 32.7 g/dL (ref 30.0–36.0)
MCV: 81.4 fL (ref 80.0–100.0)
MCV: 81.9 fL (ref 80.0–100.0)
Platelets: 148 10*3/uL — ABNORMAL LOW (ref 150–400)
Platelets: 160 10*3/uL (ref 150–400)
RBC: 3.5 MIL/uL — ABNORMAL LOW (ref 3.87–5.11)
RBC: 3.59 MIL/uL — ABNORMAL LOW (ref 3.87–5.11)
RDW: 13.5 % (ref 11.5–15.5)
RDW: 13.7 % (ref 11.5–15.5)
WBC: 14.7 10*3/uL — ABNORMAL HIGH (ref 4.0–10.5)
WBC: 15.5 10*3/uL — ABNORMAL HIGH (ref 4.0–10.5)
nRBC: 0 % (ref 0.0–0.2)
nRBC: 0 % (ref 0.0–0.2)

## 2022-02-07 LAB — SURGICAL PATHOLOGY

## 2022-02-07 LAB — ECHO INTRAOPERATIVE TEE
AV Mean grad: 51 mmHg
Height: 65 in
Weight: 3168 oz

## 2022-02-07 LAB — MAGNESIUM
Magnesium: 2.2 mg/dL (ref 1.7–2.4)
Magnesium: 1.9 mg/dL (ref 1.7–2.4)

## 2022-02-07 MED ORDER — INSULIN ASPART 100 UNIT/ML IJ SOLN
0.0000 [IU] | INTRAMUSCULAR | Status: DC
  Administered 2022-02-07: 2 [IU] via SUBCUTANEOUS
  Administered 2022-02-07: 8 [IU] via SUBCUTANEOUS
  Administered 2022-02-07 (×2): 2 [IU] via SUBCUTANEOUS
  Administered 2022-02-08: 4 [IU] via SUBCUTANEOUS
  Administered 2022-02-08: 2 [IU] via SUBCUTANEOUS

## 2022-02-07 MED ORDER — ENOXAPARIN SODIUM 40 MG/0.4ML IJ SOSY
40.0000 mg | PREFILLED_SYRINGE | Freq: Every day | INTRAMUSCULAR | Status: DC
  Administered 2022-02-07 – 2022-02-10 (×4): 40 mg via SUBCUTANEOUS
  Filled 2022-02-07 (×4): qty 0.4

## 2022-02-07 MED ORDER — FUROSEMIDE 10 MG/ML IJ SOLN
40.0000 mg | Freq: Two times a day (BID) | INTRAMUSCULAR | Status: AC
  Administered 2022-02-07 (×2): 40 mg via INTRAVENOUS
  Filled 2022-02-07 (×2): qty 4

## 2022-02-07 MED ORDER — METOPROLOL TARTRATE 25 MG PO TABS
25.0000 mg | ORAL_TABLET | Freq: Two times a day (BID) | ORAL | Status: DC
Start: 2022-02-07 — End: 2022-02-08
  Administered 2022-02-07 – 2022-02-08 (×3): 25 mg via ORAL
  Filled 2022-02-07 (×3): qty 1

## 2022-02-07 MED ORDER — METOPROLOL TARTRATE 25 MG/10 ML ORAL SUSPENSION: 25.0000 mg | Freq: Two times a day (BID) | ORAL | Status: DC

## 2022-02-07 MED ORDER — METOCLOPRAMIDE HCL 5 MG/ML IJ SOLN
10.0000 mg | Freq: Four times a day (QID) | INTRAMUSCULAR | Status: AC
  Administered 2022-02-07 – 2022-02-08 (×4): 10 mg via INTRAVENOUS
  Filled 2022-02-07 (×4): qty 2

## 2022-02-07 MED ORDER — INSULIN DETEMIR 100 UNIT/ML ~~LOC~~ SOLN
30.0000 [IU] | Freq: Every day | SUBCUTANEOUS | Status: DC
  Administered 2022-02-07: 30 [IU] via SUBCUTANEOUS
  Filled 2022-02-07: qty 0.3

## 2022-02-07 MED ORDER — KETOROLAC TROMETHAMINE 15 MG/ML IJ SOLN
15.0000 mg | Freq: Four times a day (QID) | INTRAMUSCULAR | Status: DC | PRN
  Administered 2022-02-08: 15 mg via INTRAVENOUS
  Filled 2022-02-07 (×2): qty 1

## 2022-02-07 MED ORDER — POTASSIUM CHLORIDE CRYS ER 20 MEQ PO TBCR
20.0000 meq | EXTENDED_RELEASE_TABLET | Freq: Once | ORAL | Status: AC
  Administered 2022-02-07: 20 meq via ORAL
  Filled 2022-02-07: qty 1

## 2022-02-07 MED ORDER — TRAMADOL HCL 50 MG PO TABS
50.0000 mg | ORAL_TABLET | ORAL | Status: DC | PRN
  Administered 2022-02-07 – 2022-02-11 (×6): 50 mg via ORAL
  Filled 2022-02-07 (×6): qty 1

## 2022-02-07 MED ORDER — INSULIN DETEMIR 100 UNIT/ML ~~LOC~~ SOLN
30.0000 [IU] | Freq: Every day | SUBCUTANEOUS | Status: DC
  Administered 2022-02-08 – 2022-02-11 (×4): 30 [IU] via SUBCUTANEOUS
  Filled 2022-02-07 (×4): qty 0.3

## 2022-02-07 MED FILL — Thrombin (Recombinant) For Soln 20000 Unit: CUTANEOUS | Qty: 1 | Status: AC

## 2022-02-07 NOTE — Progress Notes (Signed)
?  Transition of Care (TOC) Screening Note ? ? ?Patient Details  ?Name: Sarah Pope ?Date of Birth: 1955/12/29 ? ? ?Transition of Care (TOC) CM/SW Contact:    ?Milas Gain, LCSWA ?Phone Number: ?02/07/2022, 3:51 PM ? ? ? ?Transition of Care Department [facility]) has reviewed patient and no TOC needs have been identified at this time. We will continue to monitor patient advancement through interdisciplinary progression rounds. If new patient transition needs arise, please place a TOC consult. ?  ?

## 2022-02-07 NOTE — Discharge Instructions (Signed)

## 2022-02-07 NOTE — Progress Notes (Signed)
1 Day Post-Op Procedure(s) (LRB): ?AORTIC VALVE REPLACEMENT (AVR) WITH 21 MM INSIPIRIS AORTIC VALVE. (N/A) ?TRANSESOPHAGEAL ECHOCARDIOGRAM (TEE) (N/A) ?Subjective: ?Sore but otherwise ok ? ?Objective: ?Vital signs in last 24 hours: ?Temp:  [95.2 ?F (35.1 ?C)-99.1 ?F (37.3 ?C)] 99 ?F (37.2 ?C) (04/18 0630) ?Pulse Rate:  [79-103] 99 (04/18 0630) ?Cardiac Rhythm: Normal sinus rhythm (04/18 0400) ?Resp:  [10-29] 16 (04/18 0630) ?BP: (91-141)/(48-75) 135/66 (04/18 1610) ?SpO2:  [92 %-100 %] 95 % (04/18 0630) ?Arterial Line BP: (75-177)/(43-70) 175/56 (04/18 0630) ?FiO2 (%):  [40 %-50 %] 40 % (04/17 1720) ?Weight:  [99.6 kg] 99.6 kg (04/18 0600) ? ?Hemodynamic parameters for last 24 hours: ?PAP: (20-60)/(-5-33) 28/13 ?CO:  [2.3 L/min-5.1 L/min] 5.1 L/min ?CI:  [1.2 L/min/m2-2.6 L/min/m2] 2.6 L/min/m2 ? ?Intake/Output from previous day: ?04/17 0701 - 04/18 0700 ?In: 5140.9 [I.V.:2621.5; Blood:910; IV Piggyback:1609.5] ?Out: 2235 [RUEAV:4098; Blood:600; Chest Tube:260] ?Intake/Output this shift: ?Total I/O ?In: 587.4 [I.V.:323.1; IV Piggyback:264.3] ?Out: 605 [Urine:445; Chest Tube:160] ? ?General appearance: alert and cooperative ?Neurologic: intact ?Heart: regular rate and rhythm, S1, S2 normal, no murmur ?Lungs: clear to auscultation bilaterally ?Extremities: edema mild ?Wound: dressing dry ? ?Lab Results: ?Recent Labs  ?  02/06/22 ?1842 02/06/22 ?2010 02/07/22 ?1191  ?WBC 12.0*  --  14.7*  ?HGB 9.2* 9.5* 9.3*  ?HCT 28.4* 28.0* 28.5*  ?PLT 150  --  148*  ? ?BMET:  ?Recent Labs  ?  02/06/22 ?1842 02/06/22 ?2010 02/07/22 ?4782  ?NA 138 138 135  ?K 5.0 4.7 4.4  ?CL 108  --  102  ?CO2 26  --  24  ?GLUCOSE 117*  --  114*  ?BUN 11  --  12  ?CREATININE 0.65  --  0.80  ?CALCIUM 7.5*  --  7.7*  ?  ?PT/INR:  ?Recent Labs  ?  02/06/22 ?1234  ?LABPROT 17.7*  ?INR 1.5*  ? ?ABG ?   ?Component Value Date/Time  ? PHART 7.308 (L) 02/06/2022 2010  ? HCO3 25.2 02/06/2022 2010  ? TCO2 27 02/06/2022 2010  ? ACIDBASEDEF 1.0 02/06/2022 2010  ?  O2SAT 95 02/06/2022 2010  ? ?CBG (last 3)  ?Recent Labs  ?  02/07/22 ?0427 02/07/22 ?0503 02/07/22 ?0559  ?GLUCAP 121* 110* 133*  ? ?CXR: bibasilar atelectasis ? ?Assessment/Plan: ?S/P Procedure(s) (LRB): ?AORTIC VALVE REPLACEMENT (AVR) WITH 21 MM INSIPIRIS AORTIC VALVE. (N/A) ?TRANSESOPHAGEAL ECHOCARDIOGRAM (TEE) (N/A) ? ?POD 1 ?Hemodynamically stable in sinus rhythm low 100's. With HTN will increase Lopressor to 25 bid and titrate up as needed. ? ?DC chest tubes, swan, arterial line. ? ?Volume excess: start diuresis ? ?DM: transition to Levemir and SSI. Preop Hgb A1c 8.6. ? ?IS, OOB, mobilize. ? ? ? ? LOS: 1 day  ? ? ?Sarah Pope ?02/07/2022 ? ? ?

## 2022-02-07 NOTE — Progress Notes (Signed)
EVENING ROUNDS NOTE : ? ?   ?[address] ?      [place] [address] ?            [phone]   ?              ?1 Day Post-Op ?Procedure(s) (LRB): ?AORTIC VALVE REPLACEMENT (AVR) WITH 21 MM INSIPIRIS AORTIC VALVE. (N/A) ?TRANSESOPHAGEAL ECHOCARDIOGRAM (TEE) (N/A) ? ? ?Total Length of Stay:  LOS: 1 day  ?Events:   ?No events ?Up to chair ? ? ? ? ?BP (!) 120/59   Pulse 86   Temp 98.5 ?F (36.9 ?C) (Oral)   Resp 16   Ht '5\' 5"'$  (1.651 m)   Wt 99.6 kg   SpO2 96%   BMI 36.54 kg/m?  ? ?PAP: (22-39)/(7-20) 32/14 ?CO:  [4.3 L/min-5.1 L/min] 5.1 L/min ?CI:  [2.2 L/min/m2-2.6 L/min/m2] 2.6 L/min/m2 ? ?  ? ? sodium chloride Stopped (02/07/22 1240)  ? sodium chloride    ? sodium chloride 10 mL/hr at 02/06/22 1254  ?  ceFAZolin (ANCEF) IV 2 g (02/07/22 1517)  ? lactated ringers    ? lactated ringers Stopped (02/07/22 1243)  ? nitroGLYCERIN Stopped (02/07/22 0654)  ? phenylephrine (NEO-SYNEPHRINE) Adult infusion Stopped (02/07/22 0538)  ? ? ?I/O last 3 completed shifts: ?In: 5309.7 [I.V.:2690.3; Blood:910; IV Piggyback:1709.5] ?Out: 2270 [Urine:1410; Blood:600; Chest Tube:260] ? ? ? ?  Latest Ref Rng & Units 02/07/2022  ?  4:35 AM 02/06/2022  ?  8:10 PM 02/06/2022  ?  6:42 PM  ?CBC  ?WBC 4.0 - 10.5 K/uL 14.7    12.0    ?Hemoglobin 12.0 - 15.0 g/dL 9.3   9.5   9.2    ?Hematocrit 36.0 - 46.0 % 28.5   28.0   28.4    ?Platelets 150 - 400 K/uL 148    150    ? ? ? ?  Latest Ref Rng & Units 02/07/2022  ?  4:35 AM 02/06/2022  ?  8:10 PM 02/06/2022  ?  6:42 PM  ?BMP  ?Glucose 70 - 99 mg/dL 114    117    ?BUN 8 - 23 mg/dL 12    11    ?Creatinine 0.44 - 1.00 mg/dL 0.80    0.65    ?Sodium 135 - 145 mmol/L 135   138   138    ?Potassium 3.5 - 5.1 mmol/L 4.4   4.7   5.0    ?Chloride 98 - 111 mmol/L 102    108    ?CO2 22 - 32 mmol/L 24    26    ?Calcium 8.9 - 10.3 mg/dL 7.7    7.5    ? ? ?ABG ?   ?Component Value Date/Time  ? PHART 7.308 (L) 02/06/2022 2010  ? PCO2ART 50.2 (H) 02/06/2022 2010  ? PO2ART 82 (L) 02/06/2022 2010  ?  HCO3 25.2 02/06/2022 2010  ? TCO2 27 02/06/2022 2010  ? ACIDBASEDEF 1.0 02/06/2022 2010  ? O2SAT 95 02/06/2022 2010  ? ? ? ? ? ?Melodie Bouillon, MD ?02/07/2022 5:43 PM ? ? ?

## 2022-02-07 NOTE — Progress Notes (Signed)
While this RN in room pt had tooth fall out of her mouth. Pt wished to keep the tooth, tooth placed in specimen container at bedside.  ?

## 2022-02-07 NOTE — Anesthesia Postprocedure Evaluation (Signed)
Anesthesia Post Note ? ?Patient: Sarah Pope ? ?Procedure(s) Performed: AORTIC VALVE REPLACEMENT (AVR) WITH 21 MM INSIPIRIS AORTIC VALVE. (Chest) ?TRANSESOPHAGEAL ECHOCARDIOGRAM (TEE) ? ?  ? ?Patient location during evaluation: SICU ?Anesthesia Type: General ?Level of consciousness: sedated ?Pain management: pain level controlled ?Vital Signs Assessment: post-procedure vital signs reviewed and stable ?Respiratory status: patient remains intubated per anesthesia plan ?Cardiovascular status: stable ?Postop Assessment: no apparent nausea or vomiting ?Anesthetic complications: no ? ? ?No notable events documented. ? ?Last Vitals:  ?Vitals:  ? 02/07/22 0630 02/07/22 0805  ?BP:    ?Pulse: 99   ?Resp: 16   ?Temp: 37.2 ?C   ?SpO2: 95% 94%  ?  ?Last Pain:  ?Vitals:  ? 02/07/22 0519  ?TempSrc:   ?PainSc: Asleep  ? ? ?  ?  ?  ?  ?  ?  ? ?Linganore S ? ? ? ? ?

## 2022-02-07 NOTE — Discharge Summary (Addendum)
? ?   ?[address] ?      [place] [address] ?            [phone]   ? ?Physician Discharge Summary  ?Patient ID: ?Sarah Pope ?MRN: [id number] ?DOB/AGE: 20-Feb-1956 66 y.o. ? ?Admit date: 02/06/2022 ?Discharge date: 02/11/2022 ?Admission Diagnoses: ? ?Patient Active Problem List  ? Diagnosis Date Noted  ? Nonrheumatic aortic valve stenosis   ? Acute respiratory failure with hypoxia ([place]) 10/25/2021  ? Asthma, chronic, unspecified asthma severity, with acute exacerbation 10/25/2021  ? Insomnia 09/27/2020  ? Fall (on) (from) other stairs and steps, initial encounter 08/31/2020  ? Dysuria 08/31/2020  ? Acute cystitis without hematuria 08/31/2020  ? Depression with anxiety 04/10/2018  ? Hypomagnesemia 10/05/2016  ? Varicose veins of both lower extremities 10/05/2016  ? Screening for colon cancer 10/04/2016  ? Depression 09/29/2015  ? Hyperlipidemia LDL goal <100 02/05/2013  ? OSA (obstructive sleep apnea) 12/02/2012  ? GERD (gastroesophageal reflux disease) 10/23/2012  ? Moderate persistent asthma with reflux disease 09/02/2012  ? DM type 2 (diabetes mellitus, type 2) ([place]) 06/30/2012  ? HTN (hypertension) 06/30/2012  ? Seasonal allergies 06/30/2012  ? Hypothyroidism 06/30/2012  ? Elevated LFTs 06/30/2012  ? OBESITY 08/05/2008  ? ?Discharge Diagnoses:  ?Patient Active Problem List  ? Diagnosis Date Noted  ? S/P AVR 02/06/2022  ? Nonrheumatic aortic valve stenosis   ? Acute respiratory failure with hypoxia ([place]) 10/25/2021  ? Asthma, chronic, unspecified asthma severity, with acute exacerbation 10/25/2021  ? Insomnia 09/27/2020  ? Fall (on) (from) other stairs and steps, initial encounter 08/31/2020  ? Dysuria 08/31/2020  ? Acute cystitis without hematuria 08/31/2020  ? Depression with anxiety 04/10/2018  ? Hypomagnesemia 10/05/2016  ? Varicose veins of both lower extremities 10/05/2016  ? Screening for colon cancer 10/04/2016  ? Depression 09/29/2015  ? Hyperlipidemia LDL goal <100 02/05/2013  ?  OSA (obstructive sleep apnea) 12/02/2012  ? GERD (gastroesophageal reflux disease) 10/23/2012  ? Moderate persistent asthma with reflux disease 09/02/2012  ? DM type 2 (diabetes mellitus, type 2) ([place]) 06/30/2012  ? HTN (hypertension) 06/30/2012  ? Seasonal allergies 06/30/2012  ? Hypothyroidism 06/30/2012  ? Elevated LFTs 06/30/2012  ? OBESITY 08/05/2008  ? ?Discharged Condition: good ? ?History of Present Illness: ? ?The patient is a 66 year old woman with a history of type 2 diabetes, hypertension, hyperlipidemia, hypothyroidism, obesity, and severe aortic stenosis that has been followed by Dr. Harl Bowie who was admitted in early January 2023 with acute hypoxemic respiratory failure that was felt to be due to an asthma exacerbation. She presented with cough, shortness of breath, and wheezing. She improved with IV steroids and bronchodilators and transient use of oxygen. She was successfully weaned off oxygen and sent home. Her echocardiogram on 10/19/2021 had shown a trileaflet aortic valve that was heavily calcified with a mean gradient of 28.7 mmHg and a peak gradient of 54 mmHg. Valve area by VTI was 0.57 cm?. Left ventricular ejection fraction was 55 to 60%. Stroke-volume index was 27. She underwent cardiac catheterization on 12/01/2021 in anticipation of needing aortic valve replacement. This showed no angiographic evidence of coronary disease with normal right heart pressures.   She was referred to Dr. Cyndia Bent for surgical evaluation, during which time she continued to report exertional fatigue and shortness of breath as well as some substernal chest tightness with exertion. She also complained of dizzy spells and some peripheral edema.  It was felt she would benefit from replacement of  her Aortic Valve.  The risks and benefits of the procedure were explained to the patient and she was agreeable to proceed. ? ?Hospital Course: ? ?The patient presented to [facility] on 02/06/2022.  She was taken to the  operating room and underwent Aortic Valve Replacement with a 21 mm Edwards Resilia Bioprosthetic Valve.  She tolerated the procedure without difficulty and was taken to the SICU in stable condition.  She was extubated the evening of surgery.  She was tachycardic with hypertension.  She was started on Lopressor which was titrated as needed.  Her chest tubes, arterial lines, and swan ganz catheter were removed without difficulty.  She was started on Lasix for volume overloaded state.  She was maintaining NSR and was stable for transfer to 4E on 02/08/2022. Epicardial pacing wires were removed on 04/20. She has been tolerating a diet but has not had a bowel movement yet. She was given a laxative. She is on 2 liters of oxygen via [place].  She has been successfully weaned off oxygen.  She is maintaining NSR.  She is ambulating without difficulty.  Her surgical incisions are healing without evidence of infection.  She is medically stable for discharge home today. ? ?Consults: None ? ?Significant Diagnostic Studies: cardiac graphics:  ? ?Echocardiogram:   ?Indications:    Aortic Stenosis  ?   ?History:        Patient has prior history of Echocardiogram examinations,  ?most  ?                recent 12/09/2020. Risk Factors:Diabetes, Hypertension,  ?                Dyslipidemia and Former Smoker.  ?   ?Sonographer:    Wenda Low  ?Referring Phys: [id number] [place]  ? ?IMPRESSIONS  ? ? 1. Left ventricular ejection fraction, by estimation, is 55 to 60%. The  ?left ventricle has normal function. The left ventricle has no regional  ?wall motion abnormalities. There is mild left ventricular hypertrophy.  ?Left ventricular diastolic parameters  ?are consistent with Grade I diastolic dysfunction (impaired relaxation).  ? 2. Right ventricular systolic function is normal. The right ventricular  ?size is normal. There is normal pulmonary artery systolic pressure.  ? 3. The mitral valve is abnormal. No evidence of mitral valve   ?regurgitation. No evidence of mitral stenosis. Moderate mitral annular  ?calcification.  ? 4. Morphologically tri leaflet vavle heavily calcified. Gradients stable  ?since February 2022 but smaller LVOT diameter used DVI/AVA and peak  ?velocity from suprasternal notch ( 4 m/sec ) as well as morphology suggest  ?severe AS despite mean gradient of  ?28.7 mmHhg Suggest right and left cath to r/o CAD and further evaluate .  ?The aortic valve is tricuspid. There is severe calcifcation of the aortic  ?valve. There is severe thickening of the aortic valve. Aortic valve  ?regurgitation is trivial. Moderate to  ?severe aortic valve stenosis.  ? 5. The inferior vena cava is normal in size with greater than 50%  ?respiratory variability, suggesting right atrial pressure of 3 mmHg.  ? ?Treatments: surgery:  ? ?02/06/2022 ?[place] ?[id number] ?  ?Surgeon:  Gaye Pollack, MD ?  ?First Assistant: Ellwood Handler,  PA-C:  An experienced assistant was required given the complexity of this surgery and the standard of surgical care. The assistant was needed for exposure, dissection, suctioning, retraction of delicate tissues and sutures, instrument exchange and for overall help during  this procedure.  ?   ?Preoperative Diagnosis:  Severe aortic stenosis ?  ?  ?Postoperative Diagnosis:  Same ?   ?Procedure: ?  ?Median Sternotomy ?Extracorporeal circulation ?3.   Aortic valve replacement using a 21 mm Edwards INSPIRIS RESILIA pericardial valve. (BSA 2.0 m2) ? ? ?Discharge Exam: ?Blood pressure (!) 125/56, pulse 79, temperature 99 ?F (37.2 ?C), temperature source Oral, resp. rate 20, height '5\' 5"'$  (1.651 m), weight 94.5 kg, SpO2 94 %. ? ?General appearance: alert, cooperative, and no distress ?Heart: regular rate and rhythm and no murmur ?Lungs: min dim in bases ?Abdomen: benign ?Extremities: minor edema ?Wound: incis healing well ? ?Discharge Medications: ? ?The patient has been discharged on: ? ? ?1.Beta Blocker:  Yes [ X  ] ?                              No   [   ] ?                             If No, reason: ? ?2.Ace Inhibitor/ARB: Yes [   ] ?                                    No  [  n  ] ?                                    If

## 2022-02-08 ENCOUNTER — Inpatient Hospital Stay (HOSPITAL_COMMUNITY): Payer: HMO

## 2022-02-08 LAB — CBC
HCT: 28.6 % — ABNORMAL LOW (ref 36.0–46.0)
Hemoglobin: 8.9 g/dL — ABNORMAL LOW (ref 12.0–15.0)
MCH: 25.8 pg — ABNORMAL LOW (ref 26.0–34.0)
MCHC: 31.1 g/dL (ref 30.0–36.0)
MCV: 82.9 fL (ref 80.0–100.0)
Platelets: 152 10*3/uL (ref 150–400)
RBC: 3.45 MIL/uL — ABNORMAL LOW (ref 3.87–5.11)
RDW: 13.9 % (ref 11.5–15.5)
WBC: 13.6 10*3/uL — ABNORMAL HIGH (ref 4.0–10.5)
nRBC: 0 % (ref 0.0–0.2)

## 2022-02-08 LAB — BASIC METABOLIC PANEL
Anion gap: 4 — ABNORMAL LOW (ref 5–15)
BUN: 13 mg/dL (ref 8–23)
CO2: 29 mmol/L (ref 22–32)
Calcium: 8.2 mg/dL — ABNORMAL LOW (ref 8.9–10.3)
Chloride: 103 mmol/L (ref 98–111)
Creatinine, Ser: 0.7 mg/dL (ref 0.44–1.00)
GFR, Estimated: >60 mL/min (ref >60)
Glucose, Bld: 115 mg/dL — ABNORMAL HIGH (ref 70–99)
Potassium: 4.2 mmol/L (ref 3.5–5.1)
Sodium: 136 mmol/L (ref 135–145)

## 2022-02-08 LAB — GLUCOSE, CAPILLARY
Glucose-Capillary: 95 mg/dL (ref 70–99)
Glucose-Capillary: 125 mg/dL — ABNORMAL HIGH (ref 70–99)
Glucose-Capillary: 179 mg/dL — ABNORMAL HIGH (ref 70–99)
Glucose-Capillary: 164 mg/dL — ABNORMAL HIGH (ref 70–99)
Glucose-Capillary: 191 mg/dL — ABNORMAL HIGH (ref 70–99)

## 2022-02-08 MED ORDER — SODIUM CHLORIDE 0.9% FLUSH
3.0000 mL | Freq: Two times a day (BID) | INTRAVENOUS | Status: DC
  Administered 2022-02-08 – 2022-02-10 (×5): 3 mL via INTRAVENOUS

## 2022-02-08 MED ORDER — POTASSIUM CHLORIDE CRYS ER 20 MEQ PO TBCR
20.0000 meq | EXTENDED_RELEASE_TABLET | Freq: Every day | ORAL | Status: DC
  Administered 2022-02-09 – 2022-02-11 (×3): 20 meq via ORAL
  Filled 2022-02-08 (×3): qty 1

## 2022-02-08 MED ORDER — SODIUM CHLORIDE 0.9 % IV SOLN: 250.0000 mL | INTRAVENOUS | Status: DC | PRN

## 2022-02-08 MED ORDER — INSULIN ASPART 100 UNIT/ML IJ SOLN
0.0000 [IU] | Freq: Three times a day (TID) | INTRAMUSCULAR | Status: DC
  Administered 2022-02-08 (×2): 4 [IU] via SUBCUTANEOUS
  Administered 2022-02-09 (×2): 8 [IU] via SUBCUTANEOUS
  Administered 2022-02-09: 2 [IU] via SUBCUTANEOUS
  Administered 2022-02-10: 12 [IU] via SUBCUTANEOUS
  Administered 2022-02-10: 8 [IU] via SUBCUTANEOUS
  Administered 2022-02-10: 2 [IU] via SUBCUTANEOUS

## 2022-02-08 MED ORDER — ACETAMINOPHEN 325 MG PO TABS
650.0000 mg | ORAL_TABLET | Freq: Four times a day (QID) | ORAL | Status: DC | PRN
  Administered 2022-02-10 – 2022-02-11 (×2): 650 mg via ORAL
  Filled 2022-02-08 (×2): qty 2

## 2022-02-08 MED ORDER — DOCUSATE SODIUM 100 MG PO CAPS
200.0000 mg | ORAL_CAPSULE | Freq: Every day | ORAL | Status: DC
  Administered 2022-02-09 – 2022-02-11 (×3): 200 mg via ORAL
  Filled 2022-02-08 (×3): qty 2

## 2022-02-08 MED ORDER — BISACODYL 10 MG RE SUPP: 10.0000 mg | Freq: Every day | RECTAL | Status: DC | PRN

## 2022-02-08 MED ORDER — ASPIRIN EC 325 MG PO TBEC
325.0000 mg | DELAYED_RELEASE_TABLET | Freq: Every day | ORAL | Status: DC
  Administered 2022-02-09 – 2022-02-11 (×3): 325 mg via ORAL
  Filled 2022-02-08 (×3): qty 1

## 2022-02-08 MED ORDER — BISACODYL 5 MG PO TBEC: 10.0000 mg | DELAYED_RELEASE_TABLET | Freq: Every day | ORAL | Status: DC | PRN

## 2022-02-08 MED ORDER — FUROSEMIDE 10 MG/ML IJ SOLN
40.0000 mg | Freq: Two times a day (BID) | INTRAMUSCULAR | Status: AC
  Administered 2022-02-08 (×2): 40 mg via INTRAVENOUS
  Filled 2022-02-08 (×2): qty 4

## 2022-02-08 MED ORDER — POTASSIUM CHLORIDE CRYS ER 20 MEQ PO TBCR
20.0000 meq | EXTENDED_RELEASE_TABLET | Freq: Two times a day (BID) | ORAL | Status: DC
  Administered 2022-02-08: 20 meq via ORAL
  Filled 2022-02-08: qty 1

## 2022-02-08 MED ORDER — PNEUMOCOCCAL 20-VAL CONJ VACC 0.5 ML IM SUSY
0.5000 mL | PREFILLED_SYRINGE | INTRAMUSCULAR | Status: DC
  Filled 2022-02-08: qty 0.5

## 2022-02-08 MED ORDER — METOPROLOL TARTRATE 25 MG PO TABS: 25.0000 mg | ORAL_TABLET | Freq: Two times a day (BID) | ORAL | Status: DC

## 2022-02-08 MED ORDER — SODIUM CHLORIDE 0.9% FLUSH: 3.0000 mL | INTRAVENOUS | Status: DC | PRN

## 2022-02-08 MED ORDER — ~~LOC~~ CARDIAC SURGERY, PATIENT & FAMILY EDUCATION: Freq: Once | Status: AC

## 2022-02-08 MED ORDER — METOPROLOL TARTRATE 25 MG PO TABS
25.0000 mg | ORAL_TABLET | Freq: Two times a day (BID) | ORAL | Status: DC
Start: 2022-02-08 — End: 2022-02-11
  Administered 2022-02-08 – 2022-02-11 (×6): 25 mg via ORAL
  Filled 2022-02-08 (×6): qty 1

## 2022-02-08 MED ORDER — FUROSEMIDE 40 MG PO TABS: 40.0000 mg | ORAL_TABLET | Freq: Every day | ORAL | Status: DC

## 2022-02-08 NOTE — Progress Notes (Signed)
2 Days Post-Op Procedure(s) (LRB): ?AORTIC VALVE REPLACEMENT (AVR) WITH 21 MM INSIPIRIS AORTIC VALVE. (N/A) ?TRANSESOPHAGEAL ECHOCARDIOGRAM (TEE) (N/A) ?Subjective: ?Sore but otherwise feels ok, ambulated around ICU last pm and this am. ? ?Objective: ?Vital signs in last 24 hours: ?Temp:  [98.1 ?F (36.7 ?C)-99.1 ?F (37.3 ?C)] 98.6 ?F (37 ?C) (04/19 1107) ?Pulse Rate:  [73-94] 83 (04/19 0900) ?Cardiac Rhythm: Normal sinus rhythm (04/19 0700) ?Resp:  [15-24] 19 (04/19 0900) ?BP: (102-130)/(47-89) 108/47 (04/19 0900) ?SpO2:  [92 %-97 %] 92 % (04/19 0900) ?Arterial Line BP: (147-159)/(53-58) 159/58 (04/18 1230) ?Weight:  [97.4 kg] 97.4 kg (04/19 0530) ? ?Hemodynamic parameters for last 24 hours: ?PAP: (27-32)/(11-14) 32/14 ? ?Intake/Output from previous day: ?04/18 0701 - 04/19 0700 ?In: 380.4 [I.V.:180.3; IV Piggyback:200.1] ?Out: 2710 [Urine:2670; Chest Tube:40] ?Intake/Output this shift: ?Total I/O ?In: 220 [P.O.:120; IV Piggyback:100] ?Out: 350 [Urine:350] ? ?General appearance: alert and cooperative ?Neurologic: intact ?Heart: regular rate and rhythm, S1, S2 normal, no murmur ?Lungs: diminished breath sounds bibasilar ?Extremities: edema moderate ?Wound: incision ok ? ?Lab Results: ?Recent Labs  ?  02/07/22 ?2229 02/08/22 ?0331  ?WBC 15.5* 13.6*  ?HGB 9.6* 8.9*  ?HCT 29.4* 28.6*  ?PLT 160 152  ? ?BMET:  ?Recent Labs  ?  02/07/22 ?7989 02/08/22 ?0331  ?NA 136 136  ?K 4.6 4.2  ?CL 104 103  ?CO2 27 29  ?GLUCOSE 237* 115*  ?BUN 13 13  ?CREATININE 0.75 0.70  ?CALCIUM 8.2* 8.2*  ?  ?PT/INR:  ?Recent Labs  ?  02/06/22 ?1234  ?LABPROT 17.7*  ?INR 1.5*  ? ?ABG ?   ?Component Value Date/Time  ? PHART 7.308 (L) 02/06/2022 2010  ? HCO3 25.2 02/06/2022 2010  ? TCO2 27 02/06/2022 2010  ? ACIDBASEDEF 1.0 02/06/2022 2010  ? O2SAT 95 02/06/2022 2010  ? ?CBG (last 3)  ?Recent Labs  ?  02/08/22 ?0326 02/08/22 ?0606 02/08/22 ?1108  ?GLUCAP 95 125* 179*  ? ?CXR: bibasilar atelectasis ? ?Assessment/Plan: ?S/P Procedure(s) (LRB): ?AORTIC  VALVE REPLACEMENT (AVR) WITH 21 MM INSIPIRIS AORTIC VALVE. (N/A) ?TRANSESOPHAGEAL ECHOCARDIOGRAM (TEE) (N/A) ? ?Hemodynamically stable in sinus rhythm. Continue Lopressor ? ?Volume excess: -2300 cc yesterday with diuresis and wt down 5 lbs. Still edematous and 17 lbs over recorded preop wt. Continue diuresis. ? ?DM: glucose under adequate control. Continue Levemir and SSI. ? ?DC sleeve and foley ? ?Transfer to 4E and continue IS, ambulation. ? ? LOS: 2 days  ? ? ?Gaye Pollack ?02/08/2022 ? ? ?

## 2022-02-08 NOTE — Progress Notes (Addendum)
Pt arrived to 4E from [place]. A&Ox4. VSS. CHG bath done. Tele applied, ccmd called. Pt denies pain or shob. Skin intact. Pacing wires present. Pt oriented to room and call light in reach. ? ?Raelyn Number, RN ? ?

## 2022-02-09 ENCOUNTER — Inpatient Hospital Stay (HOSPITAL_COMMUNITY): Payer: HMO

## 2022-02-09 LAB — BASIC METABOLIC PANEL
Anion gap: 5 (ref 5–15)
BUN: 13 mg/dL (ref 8–23)
CO2: 31 mmol/L (ref 22–32)
Calcium: 8.2 mg/dL — ABNORMAL LOW (ref 8.9–10.3)
Chloride: 101 mmol/L (ref 98–111)
Creatinine, Ser: 0.74 mg/dL (ref 0.44–1.00)
GFR, Estimated: >60 mL/min (ref >60)
Glucose, Bld: 109 mg/dL — ABNORMAL HIGH (ref 70–99)
Potassium: 4.3 mmol/L (ref 3.5–5.1)
Sodium: 137 mmol/L (ref 135–145)

## 2022-02-09 LAB — GLUCOSE, CAPILLARY
Glucose-Capillary: 96 mg/dL (ref 70–99)
Glucose-Capillary: 150 mg/dL — ABNORMAL HIGH (ref 70–99)
Glucose-Capillary: 210 mg/dL — ABNORMAL HIGH (ref 70–99)
Glucose-Capillary: 232 mg/dL — ABNORMAL HIGH (ref 70–99)

## 2022-02-09 LAB — CBC
HCT: 28.5 % — ABNORMAL LOW (ref 36.0–46.0)
Hemoglobin: 8.9 g/dL — ABNORMAL LOW (ref 12.0–15.0)
MCH: 26.1 pg (ref 26.0–34.0)
MCHC: 31.2 g/dL (ref 30.0–36.0)
MCV: 83.6 fL (ref 80.0–100.0)
Platelets: 151 10*3/uL (ref 150–400)
RBC: 3.41 MIL/uL — ABNORMAL LOW (ref 3.87–5.11)
RDW: 13.8 % (ref 11.5–15.5)
WBC: 12.5 10*3/uL — ABNORMAL HIGH (ref 4.0–10.5)
nRBC: 0 % (ref 0.0–0.2)

## 2022-02-09 MED ORDER — PNEUMOCOCCAL 20-VAL CONJ VACC 0.5 ML IM SUSY
0.5000 mL | PREFILLED_SYRINGE | INTRAMUSCULAR | Status: AC
  Administered 2022-02-11: 0.5 mL via INTRAMUSCULAR
  Filled 2022-02-09: qty 0.5

## 2022-02-09 MED ORDER — INSULIN LISPRO 100 UNIT/ML ~~LOC~~ SOLN: 12.0000 [IU] | Freq: Three times a day (TID) | SUBCUTANEOUS | Status: DC

## 2022-02-09 MED ORDER — FUROSEMIDE 40 MG PO TABS
40.0000 mg | ORAL_TABLET | Freq: Every day | ORAL | Status: DC
  Administered 2022-02-10 – 2022-02-11 (×2): 40 mg via ORAL
  Filled 2022-02-09 (×2): qty 1

## 2022-02-09 MED ORDER — ACETAMINOPHEN 325 MG PO TABS: 650.0000 mg | ORAL_TABLET | Freq: Four times a day (QID) | ORAL | Status: DC | PRN

## 2022-02-09 MED ORDER — ASPIRIN 325 MG PO TBEC: 325.0000 mg | DELAYED_RELEASE_TABLET | Freq: Every day | ORAL | 0 refills | Status: DC

## 2022-02-09 MED ORDER — FUROSEMIDE 10 MG/ML IJ SOLN
40.0000 mg | Freq: Once | INTRAMUSCULAR | Status: AC
  Administered 2022-02-09: 40 mg via INTRAVENOUS
  Filled 2022-02-09: qty 4

## 2022-02-09 MED ORDER — SORBITOL 70 % SOLN
30.0000 mL | Freq: Once | Status: AC
Start: 2022-02-09 — End: 2022-02-09
  Administered 2022-02-09: 30 mL via ORAL
  Filled 2022-02-09: qty 30

## 2022-02-09 MED ORDER — ALBUTEROL SULFATE HFA 108 (90 BASE) MCG/ACT IN AERS: 2.0000 | INHALATION_SPRAY | Freq: Four times a day (QID) | RESPIRATORY_TRACT | Status: AC | PRN

## 2022-02-09 NOTE — Care Management Important Message (Signed)
Important Message ? ?Patient Details  ?Name: Sarah Pope ?MRN: [id number] ?Date of Birth: 1956-03-11 ? ? ?Medicare Important Message Given:  Yes ? ? ? ? ?Shelda Altes ?02/09/2022, 9:07 AM ?

## 2022-02-09 NOTE — Progress Notes (Signed)
Pacing wire removed . Pt tolerated well, pt educated on bedrest. Will continue to monitor  ? ?Phoebe Sharps, RN ? ? ? 02/09/22 1232  ?Vitals  ?Temp 97.7 ?F (36.5 ?C)  ?Temp Source Oral  ?BP (!) 112/48  ?MAP (mmHg) 66  ?BP Location Right Arm  ?BP Method Automatic  ?Patient Position (if appropriate) Lying  ?Pulse Rate 84  ?Pulse Rate Source Monitor  ?ECG Heart Rate 84  ?Resp 20  ?Level of Consciousness  ?Level of Consciousness Alert  ?Oxygen Therapy  ?SpO2 92 %  ?O2 Device Room Air  ?O2 Flow Rate (L/min) 0 L/min  ?FiO2 (%) 21 %  ?Patient Activity (if Appropriate) In bed  ?Pulse Oximetry Type Continuous  ?Oximetry Probe Site Changed No  ?MEWS Score  ?MEWS Temp 0  ?MEWS Systolic 0  ?MEWS Pulse 0  ?MEWS RR 0  ?MEWS LOC 0  ?MEWS Score 0  ?MEWS Score Color Green  ? ? ?

## 2022-02-09 NOTE — Progress Notes (Signed)
CARDIAC REHAB PHASE I  ? ?PRE:  Rate/Rhythm: 86 SR ? ?BP:  Sitting: 109/57     ? ?SaO2: 87 A --> 96 2L ? ?MODE:  Ambulation: 150 ft  ? ?POST:  Rate/Rhythm: 92 SR ? ?BP:  Sitting: 112/36 ? ?  SaO2: 97 2L ? ? ?Pt assisted to [facility] than ambulated 131f in hallway assist of one with slow gait. Pt states minor dizziness and some incisional pain. Pt returned to recliner. Stressed importance of IS use and ambulation. Will continue to follow. ? ?[phone]?TRufina Falco RN BSN ?02/09/2022 ?2:33 PM ? ?

## 2022-02-09 NOTE — Progress Notes (Addendum)
? ?   ?  [address] ?      [place] [address] ?            [phone]   ? ?  ? ? ?3 Days Post-Op Procedure(s) (LRB): ?AORTIC VALVE REPLACEMENT (AVR) WITH 21 MM INSIPIRIS AORTIC VALVE. (N/A) ?TRANSESOPHAGEAL ECHOCARDIOGRAM (TEE) (N/A) ? ?Subjective: ?Patient just waking up this am. She has not had a bowel movement since surgery. ? ?Objective: ?Vital signs in last 24 hours: ?Temp:  [98.3 ?F (36.8 ?C)-99.4 ?F (37.4 ?C)] 98.5 ?F (36.9 ?C) (04/20 0549) ?Pulse Rate:  [77-88] 82 (04/20 0549) ?Cardiac Rhythm: Normal sinus rhythm (04/19 1900) ?Resp:  [17-23] 17 (04/20 0549) ?BP: (97-130)/(43-63) 130/63 (04/20 0549) ?SpO2:  [90 %-99 %] 99 % (04/20 0549) ?Weight:  [101.4 kg] 101.4 kg (04/20 0549) ? ?Pre op weight 89.8 kg (?) ?Current Weight  ?02/09/22 101.4 kg  ? ? ?  ? ?Intake/Output from previous day: ?04/19 0701 - 04/20 0700 ?In: 540 [P.O.:440; IV Piggyback:100] ?Out: 1475 [BDZHG:9924] ? ? ?Physical Exam: ? ?Cardiovascular: RRR, no murmur ?Pulmonary: Slightly diminished at bases ?Abdomen: Soft, non tender, bowel sounds present. ?Extremities: Mild bilateral lower extremity edema. ?Wounds: Clean and dry.  No erythema or signs of infection. ? ?Lab Results: ?CBC: ?Recent Labs  ?  02/08/22 ?0331 02/09/22 ?0245  ?WBC 13.6* 12.5*  ?HGB 8.9* 8.9*  ?HCT 28.6* 28.5*  ?PLT 152 151  ? ?BMET:  ?Recent Labs  ?  02/08/22 ?0331 02/09/22 ?0245  ?NA 136 137  ?K 4.2 4.3  ?CL 103 101  ?CO2 29 31  ?GLUCOSE 115* 109*  ?BUN 13 13  ?CREATININE 0.70 0.74  ?CALCIUM 8.2* 8.2*  ?  ?PT/INR:  ?Lab Results  ?Component Value Date  ? INR 1.5 (H) 02/06/2022  ? INR 1.0 02/02/2022  ? INR 1.0 10/25/2021  ? ?ABG:  ?INR: ?Will add last result for INR, ABG once components are confirmed ?Will add last 4 CBG results once components are confirmed ? ?Assessment/Plan: ? ?1. CV - SR with HR in the 80's this am. On Lopressor 25 mg bid. May be able to restart low dose Losartan soon. ?2.  Pulmonary - On 2 liters of oxygen via [place]. Wean as able. Will  check. PA/LAT CXR  this am. Encourage incentive spirometer. ?3. Volume Overload - On Lasix 40 mg daily but will give IV this am ?4.  Expected post op acute blood loss anemia - H and H this am stable at 8.9 and 28.5 ?5. DM-CBGs 164/191/96. On Insulin. Will restart Metformin low dose in am ?6. History of hypothyroidism-continue Levothyroxine 175 mcg daily ?7. Remove EPW ?8. LOC constipation ? ?Donielle M ZimmermanPA-C ?7:34 AM ?  ? Chart reviewed, patient examined, agree with above. ?She looks good POD 3. I doubt wts are accurate. Her edema has decreased a lot from yesterday but still some present. Will continue diuresis. ?

## 2022-02-10 LAB — BPAM RBC
Blood Product Expiration Date: 202305042359
Blood Product Expiration Date: 202305062359
Blood Product Expiration Date: 202305062359
Blood Product Expiration Date: 202305062359
ISSUE DATE / TIME: 202304090603
ISSUE DATE / TIME: 202304171024
ISSUE DATE / TIME: 202304171024
Unit Type and Rh: 6200
Unit Type and Rh: 6200
Unit Type and Rh: 6200
Unit Type and Rh: 6200

## 2022-02-10 LAB — TYPE AND SCREEN
ABO/RH(D): A POS
Antibody Screen: NEGATIVE
Unit division: 0
Unit division: 0
Unit division: 0
Unit division: 0

## 2022-02-10 LAB — GLUCOSE, CAPILLARY
Glucose-Capillary: 95 mg/dL (ref 70–99)
Glucose-Capillary: 145 mg/dL — ABNORMAL HIGH (ref 70–99)
Glucose-Capillary: 278 mg/dL — ABNORMAL HIGH (ref 70–99)
Glucose-Capillary: 226 mg/dL — ABNORMAL HIGH (ref 70–99)

## 2022-02-10 MED FILL — Sodium Bicarbonate IV Soln 8.4%: INTRAVENOUS | Qty: 50 | Status: AC

## 2022-02-10 MED FILL — Sodium Chloride IV Soln 0.9%: INTRAVENOUS | Qty: 2000 | Status: AC

## 2022-02-10 MED FILL — Mannitol IV Soln 20%: INTRAVENOUS | Qty: 500 | Status: AC

## 2022-02-10 MED FILL — Magnesium Sulfate Inj 50%: INTRAMUSCULAR | Qty: 10 | Status: AC

## 2022-02-10 MED FILL — Potassium Chloride Inj 2 mEq/ML: INTRAVENOUS | Qty: 40 | Status: AC

## 2022-02-10 MED FILL — Heparin Sodium (Porcine) Inj 1000 Unit/ML: Qty: 1000 | Status: AC

## 2022-02-10 MED FILL — Electrolyte-R (PH 7.4) Solution: INTRAVENOUS | Qty: 4000 | Status: AC

## 2022-02-10 NOTE — Plan of Care (Signed)
  Problem: Health Behavior/Discharge Planning: Goal: Ability to manage health-related needs will improve Outcome: Progressing   Problem: Clinical Measurements: Goal: Will remain free from infection Outcome: Progressing Goal: Diagnostic test results will improve Outcome: Progressing   

## 2022-02-10 NOTE — Progress Notes (Signed)
CARDIAC REHAB PHASE I  ? ?PRE:  Rate/Rhythm: 88 SR ? ?BP:  Sitting: 128/54     ? ?SaO2: 96 RA ? ?MODE:  Ambulation: 250 ft  ? ?POST:  Rate/Rhythm: 94 SR ? ?BP:  Sitting: 140/55 ? ?  SaO2: 95 RA ? ? ?Pt ambulated 282f in hallway standby assist with front wheel walker. Pt able to increase speed and distance today, feels stronger. Pt assisted to BR than returned to recliner. D/c education completed with pt and aunt. Pt educated on importance of site care and monitoring incision daily. Encouraged continued IS use, walks, and sternal precautions. Pt given in-the-tube sheet along with heart healthy and diabetic diets. Reviewed restrictions and exercise guidelines. Will refer to CRP II [place]. ? ?[phone]?TRufina Falco RN BSN ?02/10/2022 ?9:51 AM ? ?

## 2022-02-10 NOTE — Progress Notes (Addendum)
? ?   ?  [address] ?      [place] [address] ?            [phone]   ? ?  ?4 Days Post-Op Procedure(s) (LRB): ?AORTIC VALVE REPLACEMENT (AVR) WITH 21 MM INSIPIRIS AORTIC VALVE. (N/A) ?TRANSESOPHAGEAL ECHOCARDIOGRAM (TEE) (N/A) ? ?Subjective: ? ?Patient sitting up in chair.  She is without complaints.  She was able to move her bowels this morning. ? ?Objective: ?Vital signs in last 24 hours: ?Temp:  [97.7 ?F (36.5 ?C)-99.4 ?F (37.4 ?C)] 97.8 ?F (36.6 ?C) (04/21 8811) ?Pulse Rate:  [78-95] 83 (04/21 0742) ?Cardiac Rhythm: Normal sinus rhythm (04/20 2200) ?Resp:  [18-20] 20 (04/21 0742) ?BP: (112-137)/(48-64) 133/58 (04/21 0315) ?SpO2:  [90 %-100 %] 100 % (04/21 0742) ?FiO2 (%):  [21 %] 21 % (04/20 1232) ?Weight:  [94.5 kg] 94.5 kg (04/21 0431) ? ?Intake/Output from previous day: ?04/20 0701 - 04/21 0700 ?In: 483 [P.O.:480; I.V.:3] ?Out: 1600 [Urine:1600] ? ?General appearance: alert, cooperative, and no distress ?Heart: regular rate and rhythm ?Lungs: clear to auscultation bilaterally ?Abdomen: soft, non-tender; bowel sounds normal; no masses,  no organomegaly ?Extremities: edema trace ?Wound: clean and dry ? ?Lab Results: ?Recent Labs  ?  02/08/22 ?0331 02/09/22 ?0245  ?WBC 13.6* 12.5*  ?HGB 8.9* 8.9*  ?HCT 28.6* 28.5*  ?PLT 152 151  ? ?BMET:  ?Recent Labs  ?  02/08/22 ?0331 02/09/22 ?0245  ?NA 136 137  ?K 4.2 4.3  ?CL 103 101  ?CO2 29 31  ?GLUCOSE 115* 109*  ?BUN 13 13  ?CREATININE 0.70 0.74  ?CALCIUM 8.2* 8.2*  ?  ?PT/INR: No results for input(s): LABPROT, INR in the last 72 hours. ?ABG ?   ?Component Value Date/Time  ? PHART 7.308 (L) 02/06/2022 2010  ? HCO3 25.2 02/06/2022 2010  ? TCO2 27 02/06/2022 2010  ? ACIDBASEDEF 1.0 02/06/2022 2010  ? O2SAT 95 02/06/2022 2010  ? ?CBG (last 3)  ?Recent Labs  ?  02/09/22 ?1631 02/09/22 ?2131 02/10/22 ?0618  ?GLUCAP 210* 232* 95  ? ? ?Assessment/Plan: ?S/P Procedure(s) (LRB): ?AORTIC VALVE REPLACEMENT (AVR) WITH 21 MM INSIPIRIS AORTIC VALVE.  (N/A) ?TRANSESOPHAGEAL ECHOCARDIOGRAM (TEE) (N/A) ? ?CV- NSR, BP mostly in the 110s- will monitor can restart home Cozaar as BP rises ?Pulm- off oxygen, no acute issues, ?Renal- creatinine has been stable, weight is trending down on Lasix,potassium ?Expected post operative blood loss anemia- Hgb stable ?DM- sugars elevated at times, continue insulin for now, will transition to home regimen at discharge ?Dispo- patient stable, overall doing well, feels like she needs another day, we will plan to send home in AM if she remains stable ? ? LOS: 4 days  ? ? ?Ellwood Handler, PA-C ?02/10/2022 ? ? Chart reviewed, patient examined, agree with above. ?She looks good overall. Still having some nausea at times which she thinks may be related to pain meds and wants a prescription for Zofran to take if she needs it at home which is fine. She does have a codeine sensitivity so oxy and Ultram may cause some nausea. Encouraged to use Tylenol if that is adequate. ? ?

## 2022-02-11 ENCOUNTER — Other Ambulatory Visit: Payer: Self-pay | Admitting: Family Medicine

## 2022-02-11 LAB — GLUCOSE, CAPILLARY: Glucose-Capillary: 100 mg/dL — ABNORMAL HIGH (ref 70–99)

## 2022-02-11 MED ORDER — METOPROLOL TARTRATE 25 MG PO TABS: 25.0000 mg | ORAL_TABLET | Freq: Two times a day (BID) | ORAL | 3 refills | Status: DC

## 2022-02-11 MED ORDER — POTASSIUM CHLORIDE CRYS ER 20 MEQ PO TBCR
20.0000 meq | EXTENDED_RELEASE_TABLET | Freq: Every day | ORAL | 0 refills | Status: DC
Start: 2022-02-11 — End: 2022-10-18

## 2022-02-11 MED ORDER — FUROSEMIDE 40 MG PO TABS: 40.0000 mg | ORAL_TABLET | Freq: Every day | ORAL | 0 refills | Status: DC

## 2022-02-11 MED ORDER — OXYCODONE HCL 5 MG PO TABS: 5.0000 mg | ORAL_TABLET | Freq: Four times a day (QID) | ORAL | 0 refills | Status: AC | PRN

## 2022-02-11 MED ORDER — ONDANSETRON HCL 4 MG PO TABS: 4.0000 mg | ORAL_TABLET | Freq: Three times a day (TID) | ORAL | 0 refills | Status: DC | PRN

## 2022-02-11 NOTE — Progress Notes (Signed)
? ?   ?[address] ?      [place] [address] ?            [phone]   ? ?  5 Days Post-Op Procedure(s) (LRB): ?AORTIC VALVE REPLACEMENT (AVR) WITH 21 MM INSIPIRIS AORTIC VALVE. (N/A) ?TRANSESOPHAGEAL ECHOCARDIOGRAM (TEE) (N/A) ?Subjective: ?Feels pretty well overall ? ?Objective: ?Vital signs in last 24 hours: ?Temp:  [97.8 ?F (36.6 ?C)-98.7 ?F (37.1 ?C)] 98.5 ?F (36.9 ?C) (04/22 0319) ?Pulse Rate:  [69-86] 75 (04/22 0319) ?Cardiac Rhythm: Normal sinus rhythm (04/21 1922) ?Resp:  [18-20] 19 (04/22 0319) ?BP: (101-133)/(43-67) 128/60 (04/22 0319) ?SpO2:  [90 %-100 %] 94 % (04/22 0319) ?Weight:  [94.5 kg] 94.5 kg (04/22 0319) ? ?Hemodynamic parameters for last 24 hours: ?  ? ?Intake/Output from previous day: ?04/21 0701 - 04/22 0700 ?In: 0  ?Out: 250 [Urine:250] ?Intake/Output this shift: ?No intake/output data recorded. ? ?General appearance: alert, cooperative, and no distress ?Heart: regular rate and rhythm and no murmur ?Lungs: min dim in bases ?Abdomen: benign ?Extremities: minor edema ?Wound: incis healing well ? ?Lab Results: ?Recent Labs  ?  02/09/22 ?0245  ?WBC 12.5*  ?HGB 8.9*  ?HCT 28.5*  ?PLT 151  ? ?BMET:  ?Recent Labs  ?  02/09/22 ?0245  ?NA 137  ?K 4.3  ?CL 101  ?CO2 31  ?GLUCOSE 109*  ?BUN 13  ?CREATININE 0.74  ?CALCIUM 8.2*  ?  ?PT/INR: No results for input(s): LABPROT, INR in the last 72 hours. ?ABG ?   ?Component Value Date/Time  ? PHART 7.308 (L) 02/06/2022 2010  ? HCO3 25.2 02/06/2022 2010  ? TCO2 27 02/06/2022 2010  ? ACIDBASEDEF 1.0 02/06/2022 2010  ? O2SAT 95 02/06/2022 2010  ? ?CBG (last 3)  ?Recent Labs  ?  02/10/22 ?1553 02/10/22 ?2129 02/11/22 ?0604  ?GLUCAP 278* 226* 100*  ? ? ?Meds ?Scheduled Meds: ? aspirin EC  325 mg Oral Daily  ? atorvastatin  20 mg Oral Daily  ? citalopram  40 mg Oral Daily  ? docusate sodium  200 mg Oral Daily  ? enoxaparin (LOVENOX) injection  40 mg Subcutaneous QHS  ? furosemide  40 mg Oral Daily  ? insulin aspart  0-24 Units Subcutaneous TID  AC & HS  ? insulin detemir  30 Units Subcutaneous Daily  ? levothyroxine  175 mcg Oral Daily  ? loratadine  10 mg Oral Daily  ? mouth rinse  15 mL Mouth Rinse BID  ? metoprolol tartrate  25 mg Oral BID  ? mometasone-formoterol  2 puff Inhalation BID  ? pantoprazole  40 mg Oral Daily  ? pneumococcal 20-valent conjugate vaccine  0.5 mL Intramuscular Tomorrow-1000  ? potassium chloride  20 mEq Oral Daily  ? sodium chloride flush  3 mL Intravenous Q12H  ? ?Continuous Infusions: ? sodium chloride    ? ?PRN Meds:.sodium chloride, acetaminophen, albuterol, azelastine, bisacodyl **OR** bisacodyl, ketorolac, ondansetron (ZOFRAN) IV, oxyCODONE, sodium chloride flush, traMADol ? ?Xrays ?DG Chest 2 View ? ?Result Date: 02/09/2022 ?CLINICAL DATA:  Pleural effusion.  Asthma. EXAM: CHEST - 2 VIEW COMPARISON:  Yesterday FINDINGS: Median sternotomy for aortic valve repair. Removal of right IJ Swan-Ganz catheter. Midline trachea. Mild cardiomegaly. Tiny bilateral pleural effusions, decreased. No pneumothorax. Diminished lung volumes. Mild pulmonary venous congestion. Improved bibasilar airspace disease. IMPRESSION: Overall improved aeration with decreased tiny bilateral pleural effusions and bibasilar atelectasis. Cardiomegaly with mild pulmonary venous congestion. Electronically Signed   By: Abigail Miyamoto M.D.   On: 02/09/2022  09:57   ? ?Assessment/Plan: ?S/P Procedure(s) (LRB): ?AORTIC VALVE REPLACEMENT (AVR) WITH 21 MM INSIPIRIS AORTIC VALVE. (N/A) ?TRANSESOPHAGEAL ECHOCARDIOGRAM (TEE) (N/A) ?POD#5 ? ?1 afeb. VSS , sinus rhythm ?2 sats good on RA ?3 voiding well, not all measured, + BM ?4 no new labs or XRAY's ?5 BS variable control, hom meds at d/c with f/u with provider, not well controlled for some time- on lantus and humalog at high doses  with sliding scale, also glucophage ?6 looks well , stable for d/c ? ? ? LOS: 5 days  ? ? ?John Giovanni PA-C ?Pager [phone] ? ? ?02/11/2022 ?  ?

## 2022-02-11 NOTE — TOC Transition Note (Signed)
Transition of Care (TOC) - CM/SW Discharge Note ? ? ?Patient Details  ?Name: RANDILYN FOISY ?MRN: [id number] ?Date of Birth: 31-Dec-1955 ? ?Transition of Care (TOC) CM/SW Contact:  ?Konrad Penta, RN ?Phone Number: [phone] ?02/11/2022, 8:50 AM ? ? ?Clinical Narrative:   Spoke with Ms. Gholson about TOC needs and recommendation for RW. Ms. Bronkema states she does not need RNCM to order RW. States she is able to get one. Plans to stay with her aunt post hospitalization. Denies having any TOC needs. Ms. Wann aunt will transport her home today. ? ?No further TOC need identified. Please re-consult if needed.  ? ? ? ?Final next level of care: Home/Self Care ?Barriers to Discharge: No Barriers Identified ? ? ?Patient Goals and CMS Choice ?Patient states their goals for this hospitalization and ongoing recovery are:: return home ?  ?  ? ?Discharge Placement ?  ?           ?  ?  ?  ?  ? ?Discharge Plan and Services ?  ?  ?           ?  ?  ?  ?  ?  ?  ?  ?  ?  ?  ? ?Social Determinants of Health (SDOH) Interventions ?  ? ? ?Readmission Risk Interventions ?   ? View : No data to display.  ?  ?  ?  ? ? ? ? ? ?

## 2022-02-15 ENCOUNTER — Ambulatory Visit: Payer: Medicaid Other | Admitting: Nurse Practitioner

## 2022-02-15 ENCOUNTER — Telehealth: Payer: Self-pay | Admitting: Nurse Practitioner

## 2022-02-15 ENCOUNTER — Other Ambulatory Visit: Payer: Self-pay

## 2022-02-15 MED ORDER — FREESTYLE LIBRE 14 DAY SENSOR MISC: 2 refills | Status: DC

## 2022-02-15 NOTE — Telephone Encounter (Signed)
Pt requesting a refill on her [employer] sensors. ? ? ? ? ?[place], [place] Phone:  [phone]  ?Fax:  [phone]  ?  ? ? ?

## 2022-02-15 NOTE — Telephone Encounter (Signed)
I have sent the [employer] to the requested pharmacy. ?

## 2022-02-15 NOTE — Patient Instructions (Incomplete)
Diabetes Mellitus and Foot Care Foot care is an important part of your health, especially when you have diabetes. Diabetes may cause you to have problems because of poor blood flow (circulation) to your feet and legs, which can cause your skin to: Become thinner and drier. Break more easily. Heal more slowly. Peel and crack. You may also have nerve damage (neuropathy) in your legs and feet, causing decreased feeling in them. This means that you may not notice minor injuries to your feet that could lead to more serious problems. Noticing and addressing any potential problems early is the best way to prevent future foot problems. How to care for your feet Foot hygiene  Wash your feet daily with warm water and mild soap. Do not use hot water. Then, pat your feet and the areas between your toes until they are completely dry. Do not soak your feet as this can dry your skin. Trim your toenails straight across. Do not dig under them or around the cuticle. File the edges of your nails with an emery board or nail file. Apply a moisturizing lotion or petroleum jelly to the skin on your feet and to dry, brittle toenails. Use lotion that does not contain alcohol and is unscented. Do not apply lotion between your toes. Shoes and socks Wear clean socks or stockings every day. Make sure they are not too tight. Do not wear knee-high stockings since they may decrease blood flow to your legs. Wear shoes that fit properly and have enough cushioning. Always look in your shoes before you put them on to be sure there are no objects inside. To break in new shoes, wear them for just a few hours a day. This prevents injuries on your feet. Wounds, scrapes, corns, and calluses  Check your feet daily for blisters, cuts, bruises, sores, and redness. If you cannot see the bottom of your feet, use a mirror or ask someone for help. Do not cut corns or calluses or try to remove them with medicine. If you find a minor scrape,  cut, or break in the skin on your feet, keep it and the skin around it clean and dry. You may clean these areas with mild soap and water. Do not clean the area with peroxide, alcohol, or iodine. If you have a wound, scrape, corn, or callus on your foot, look at it several times a day to make sure it is healing and not infected. Check for: Redness, swelling, or pain. Fluid or blood. Warmth. Pus or a bad smell. General tips Do not cross your legs. This may decrease blood flow to your feet. Do not use heating pads or hot water bottles on your feet. They may burn your skin. If you have lost feeling in your feet or legs, you may not know this is happening until it is too late. Protect your feet from hot and cold by wearing shoes, such as at the beach or on hot pavement. Schedule a complete foot exam at least once a year (annually) or more often if you have foot problems. Report any cuts, sores, or bruises to your health care provider immediately. Where to find more information American Diabetes Association: www.diabetes.org Association of Diabetes Care & Education Specialists: www.diabeteseducator.org Contact a health care provider if: You have a medical condition that increases your risk of infection and you have any cuts, sores, or bruises on your feet. You have an injury that is not healing. You have redness on your legs or feet. You   feel burning or tingling in your legs or feet. You have pain or cramps in your legs and feet. Your legs or feet are numb. Your feet always feel cold. You have pain around any toenails. Get help right away if: You have a wound, scrape, corn, or callus on your foot and: You have pain, swelling, or redness that gets worse. You have fluid or blood coming from the wound, scrape, corn, or callus. Your wound, scrape, corn, or callus feels warm to the touch. You have pus or a bad smell coming from the wound, scrape, corn, or callus. You have a fever. You have a red  line going up your leg. Summary Check your feet every day for blisters, cuts, bruises, sores, and redness. Apply a moisturizing lotion or petroleum jelly to the skin on your feet and to dry, brittle toenails. Wear shoes that fit properly and have enough cushioning. If you have foot problems, report any cuts, sores, or bruises to your health care provider immediately. Schedule a complete foot exam at least once a year (annually) or more often if you have foot problems. This information is not intended to replace advice given to you by your health care provider. Make sure you discuss any questions you have with your health care provider. Document Revised: 04/29/2020 Document Reviewed: 04/29/2020 Elsevier Patient Education  2023 Elsevier Inc.  

## 2022-02-16 ENCOUNTER — Ambulatory Visit (INDEPENDENT_AMBULATORY_CARE_PROVIDER_SITE_OTHER): Payer: Self-pay

## 2022-02-16 DIAGNOSIS — Z4802 Encounter for removal of sutures: Secondary | ICD-10-CM

## 2022-02-16 NOTE — Progress Notes (Signed)
Patient arrived for nurse visit to remove suture/staples post- procedure AVR 02/06/22 with Dr. Cyndia Bent.  Two Sutures removed with no signs/ symptoms of infection noted.  Patient tolerated procedure well.  Patient/ family instructed to keep the incision sites clean and dry.  Patient/ family acknowledged instructions given.  Patient taking Tylenol for pain. Incisions appeared to be healing well.  ?

## 2022-03-03 ENCOUNTER — Other Ambulatory Visit: Payer: Self-pay | Admitting: Surgery

## 2022-03-03 ENCOUNTER — Other Ambulatory Visit: Payer: Self-pay | Admitting: Cardiothoracic Surgery

## 2022-03-03 DIAGNOSIS — Z952 Presence of prosthetic heart valve: Secondary | ICD-10-CM

## 2022-03-06 ENCOUNTER — Ambulatory Visit (INDEPENDENT_AMBULATORY_CARE_PROVIDER_SITE_OTHER): Payer: Self-pay | Admitting: Physician Assistant

## 2022-03-06 ENCOUNTER — Ambulatory Visit
Admission: RE | Admit: 2022-03-06 | Discharge: 2022-03-06 | Disposition: A | Discharge status: Home / Self Care | Payer: HMO | Source: Ambulatory Visit | Attending: Surgery | Admitting: Surgery

## 2022-03-06 VITALS — BP 114/67 | HR 77 | Resp 20 | Ht 65.0 in | Wt 198.0 lb

## 2022-03-06 DIAGNOSIS — Z952 Presence of prosthetic heart valve: Secondary | ICD-10-CM | POA: Diagnosis not present

## 2022-03-06 MED ORDER — TRAMADOL HCL 50 MG PO TABS
50.0000 mg | ORAL_TABLET | Freq: Four times a day (QID) | ORAL | 0 refills | Status: DC | PRN
Start: 2022-03-06 — End: 2022-10-18

## 2022-03-06 NOTE — Patient Instructions (Signed)
You are encouraged to enroll and participate in the outpatient cardiac rehab program beginning as soon as practical. ? ?Endocarditis is a potentially serious infection of heart valves or inside lining of the heart.  It occurs more commonly in patients with diseased heart valves (such as patient's with aortic or mitral valve disease) and in patients who have undergone heart valve repair or replacement. ? ?Certain surgical and dental procedures may put you at risk, such as dental cleaning, other dental procedures, or any surgery involving the respiratory, urinary, gastrointestinal tract, gallbladder or prostate gland.  ? ?To minimize your chances for develooping endocarditis, maintain good oral health and seek prompt medical attention for any infections involving the mouth, teeth, gums, skin or urinary tract.   ? ?Always notify your doctor or dentist about your underlying heart valve condition before having any invasive procedures. You will need to take antibiotics before certain procedures, including all routine dental cleanings or other dental procedures.  Your cardiologist or dentist should prescribe these antibiotics for you to be taken ahead of time. ? ? ?Make every effort to stay physically active, get some type of exercise on a regular basis, and stick to a "heart healthy diet".  The long term benefits for regular exercise and a healthy diet are critically important to your overall health and wellbeing. ? ?You may return to driving an automobile as long as you are no longer requiring oral narcotic pain relievers during the daytime.  It would be wise to start driving only short distances during the daylight and gradually increase from there as you feel comfortable. ? ?You may continue to gradually increase your physical activity as tolerated.  Refrain from any heavy lifting or strenuous use of your arms and shoulders until at least 8 weeks from the time of your surgery, and avoid activities that cause increased  pain in your chest on the side of your surgical incision.  Otherwise you may continue to increase activities without any particular limitations.  Increase the intensity and duration of physical activity gradually. ? ? ? ? ?  ? ?

## 2022-03-06 NOTE — Progress Notes (Signed)
? ?   ?[address] ?      [place] [address] ?            [phone]   ? ?  ? ?HPI: ?Patient returns for routine postoperative follow-up having undergone AVR with a 21 mm Edwards Resilia Bioprosthetic valve on 02/06/2022. ?The patient's early postoperative recovery while in the hospital was without complications. Since hospital discharge the patient reports overall she is doing very well.  She states however she needs to get her teeth fix.  She is ambulating without difficulty several times per day through her yard.  She does not wish to participate in cardiac rehab. ? ?Current Outpatient Medications  ?Medication Sig Dispense Refill  ? acetaminophen (TYLENOL) 325 MG tablet Take 2 tablets (650 mg total) by mouth every 6 (six) hours as needed for mild pain.    ? albuterol (VENTOLIN HFA) 108 (90 Base) MCG/ACT inhaler Inhale 2 puffs into the lungs every 6 (six) hours as needed for shortness of breath.    ? aspirin EC 325 MG EC tablet Take 1 tablet (325 mg total) by mouth daily. 30 tablet 0  ? atorvastatin (LIPITOR) 20 MG tablet Take 1 tablet (20 mg total) by mouth daily. 90 tablet 1  ? azelastine (ASTELIN) 0.1 % nasal spray Place 1 spray into both nostrils 2 (two) times daily as needed for rhinitis or allergies. Use in each nostril as directed    ? cetirizine (ZYRTEC) 10 MG tablet Take 10 mg by mouth daily as needed for allergies.    ? ciclopirox (PENLAC) 8 % solution Apply topically at bedtime. Apply over nail and surrounding skin. Apply daily over previous coat. After seven (7) days, may remove with alcohol and continue cycle. 6.6 mL 2  ? citalopram (CELEXA) 40 MG tablet Take 1 tablet (40 mg total) by mouth daily. 90 tablet 1  ? Continuous Blood Gluc Receiver (FREESTYLE LIBRE 14 DAY READER) DEVI USE AS DIRECTED THREE TIMES DAILY. 1 each PRN  ? Continuous Blood Gluc Sensor (FREESTYLE LIBRE 14 DAY SENSOR) MISC USE TO CHECK BLOOD SUGAR THREE TIMES DAILY WITH SLIDING SCALE INSULIN USE. 2 each 2  ?  diclofenac (VOLTAREN) 75 MG EC tablet TAKE ONE TABLET BY MOUTH TWICE daily PRN pain. TAKE WITH FOOD. 28 tablet 5  ? Fluticasone-Salmeterol (ADVAIR DISKUS) 250-50 MCG/DOSE AEPB Inhale 1 puff into the lungs 2 (two) times daily. 1 each 3  ? furosemide (LASIX) 40 MG tablet Take 1 tablet (40 mg total) by mouth daily. 7 tablet 0  ? insulin glargine (LANTUS) 100 unit/mL SOPN Inject 80 Units into the skin daily. (Patient taking differently: Inject 80 Units into the skin at bedtime.) 15 mL 2  ? insulin lispro (HUMALOG) 100 UNIT/ML injection Inject 0.12-0.18 mLs (12-18 Units total) into the skin 3 (three) times daily with meals. Sliding scale if needed    ? levothyroxine (SYNTHROID) 175 MCG tablet Take 1 tablet (175 mcg total) by mouth daily. 90 tablet 1  ? metFORMIN (GLUCOPHAGE) 1000 MG tablet Take 1 tablet (1,000 mg total) by mouth 2 (two) times daily with a meal. 180 tablet 1  ? metoprolol tartrate (LOPRESSOR) 25 MG tablet Take 1 tablet (25 mg total) by mouth 2 (two) times daily. 60 tablet 3  ? omeprazole (PRILOSEC) 40 MG capsule Take 1 capsule (40 mg total) by mouth daily. 90 capsule 1  ? ondansetron (ZOFRAN) 4 MG tablet Take 1 tablet (4 mg total) by mouth every 8 (eight) hours as needed  for nausea or vomiting. 20 tablet 0  ? potassium chloride SA (KLOR-CON M) 20 MEQ tablet Take 1 tablet (20 mEq total) by mouth daily. 7 tablet 0  ? ?No current facility-administered medications for this visit.  ? ? ?Physical Exam: ? ?BP 114/67 (BP Location: Left Arm, Patient Position: Sitting)   Pulse 77   Resp 20   Ht '5\' 5"'$  (1.651 m)   Wt 198 lb (89.8 kg)   SpO2 96% Comment: RA  BMI 32.95 kg/m?  ? ?Gen: NAD ?Heart: RRR ?Lungs: CTA bilaterally ?ABd: soft non-tender, non-distended ?Ext: no edema ?Neuro: grossly normal ? ?Diagnostic Tests: ? ?CXR: No pleural effusion, no pneumothorax, sternal wires intact, valve prosthesis present ? ?A/P: ? ?S/P AVR- doing very well ?Broken Teeth- states they fell out in the hospital.  She is going to  get Dentures in a few weeks.  Patient educated on importance of antibiotics. ?Activity increase ambulation as tolerated, cleared to drive when not taking pain medications, increase activity as sternal precautions allow ?Pain medication refill for tramadol provided ?RTC prn ? ?Ellwood Handler, PA-C ?[facility] ?[phone] ? ?

## 2022-03-08 ENCOUNTER — Encounter: Payer: Self-pay | Admitting: Student

## 2022-03-08 ENCOUNTER — Ambulatory Visit (INDEPENDENT_AMBULATORY_CARE_PROVIDER_SITE_OTHER): Payer: HMO | Admitting: Student

## 2022-03-08 ENCOUNTER — Ambulatory Visit (INDEPENDENT_AMBULATORY_CARE_PROVIDER_SITE_OTHER): Payer: HMO | Admitting: Nurse Practitioner

## 2022-03-08 ENCOUNTER — Encounter: Payer: Self-pay | Admitting: Nurse Practitioner

## 2022-03-08 VITALS — BP 106/70 | HR 69 | Ht 65.0 in | Wt 197.2 lb

## 2022-03-08 VITALS — BP 107/70 | HR 71 | Ht 65.0 in | Wt 197.0 lb

## 2022-03-08 DIAGNOSIS — Z794 Long term (current) use of insulin: Secondary | ICD-10-CM

## 2022-03-08 DIAGNOSIS — I6523 Occlusion and stenosis of bilateral carotid arteries: Secondary | ICD-10-CM

## 2022-03-08 DIAGNOSIS — E039 Hypothyroidism, unspecified: Secondary | ICD-10-CM

## 2022-03-08 DIAGNOSIS — I10 Essential (primary) hypertension: Secondary | ICD-10-CM

## 2022-03-08 DIAGNOSIS — E782 Mixed hyperlipidemia: Secondary | ICD-10-CM | POA: Diagnosis not present

## 2022-03-08 DIAGNOSIS — E119 Type 2 diabetes mellitus without complications: Secondary | ICD-10-CM

## 2022-03-08 DIAGNOSIS — I350 Nonrheumatic aortic (valve) stenosis: Secondary | ICD-10-CM | POA: Diagnosis not present

## 2022-03-08 DIAGNOSIS — E785 Hyperlipidemia, unspecified: Secondary | ICD-10-CM | POA: Diagnosis not present

## 2022-03-08 MED ORDER — OZEMPIC (0.25 OR 0.5 MG/DOSE) 2 MG/1.5ML ~~LOC~~ SOPN: 0.5000 mg | PEN_INJECTOR | SUBCUTANEOUS | 3 refills | Status: DC

## 2022-03-08 MED ORDER — LOSARTAN POTASSIUM 25 MG PO TABS: 25.0000 mg | ORAL_TABLET | Freq: Every day | ORAL | 3 refills | Status: DC

## 2022-03-08 MED ORDER — INSULIN GLARGINE 100 UNIT/ML SOLOSTAR PEN: 80.0000 [IU] | PEN_INJECTOR | Freq: Every day | SUBCUTANEOUS | 3 refills | Status: DC

## 2022-03-08 NOTE — Progress Notes (Signed)
? ?Cardiology Office Note   ? ?Date:  03/08/2022  ? ?ID:  Sarah Pope, DOB [birth year], MRN [id number] ? ?PCP:  Kerens Nation, MD  ?Cardiologist: Carlyle Dolly, MD   ? ?Chief Complaint  ?Patient presents with  ? Hospitalization Follow-up  ?  S/p AVR  ? ? ?History of Present Illness:   ? ?Sarah Pope is a 66 y.o. female with past medical history of aortic stenosis, carotid artery stenosis, HTN, HLD and IDDM who presents to the office today for hospital follow-up. ? ?She was last examined by Dr. Harl Bowie in 10/2021 and recent echocardiogram had shown progression of her aortic stenosis and was now in the severe range. She did report dyspnea on exertion and given symptoms, a R/LHC was arranged for further evaluation. This was performed by Dr. Angelena Form on 12/01/2021 and showed no angiographic evidence of CAD with normal right heart pressures. She was referred to Dr. Cyndia Bent for consideration of surgical AVR as compared to TAVR given her age. She did meet with Dr. Cyndia Bent in 12/2021 and was planning to have extractions by her dentist but it was recommended to proceed with bioprosthetic aortic valve replacement following this. She presented to [facility] on [facility]/06/2022 for the procedure and underwent aortic valve replacement using a 21 mm Edwards Inspiris Resilina pericardial valve. She progressed well in the postoperative setting and maintain normal sinus rhythm. She did have volume overload and responded well to IV Lasix during admission. Was discharged home on 02/11/2022.  ? ?In talking with the patient today, she reports overall doing well since her recent surgery. Says that she has "good days and bad days". Her sternal pain continues to improve and she is only taking Tylenol as needed.  Reports that her breathing has improved as well since surgical repair and she is walking in her yard for exercise.  Denies any recent orthopnea, PND or pitting edema. ? ? ?Past Medical History:  ?Diagnosis Date  ? Acute  respiratory failure with hypercapnia (HCC)   ? Allergy   ? Anxiety   ? Aortic stenosis   ? a. S/p AVR on 02/06/2022 using a 21 mm Edwards Inspiris Resilina pericardial valve  ? Asthma   ? Bronchitis with bronchospasm   ? Cancer [facility])   ? skin  ? Depression   ? DM type 2 (diabetes mellitus, type 2) ([place])   ? FATTY LIVER DISEASE 08/05/2008  ? GERD (gastroesophageal reflux disease)   ? Hyperlipidemia   ? Hypertension   ? Hypothyroidism   ? Kidney stones   ? Leukocytosis   ? Obesity, Class III, BMI 40-49.9 (morbid obesity) ([place])   ? PERSONAL HX COLONIC POLYPS 09/01/2008  ? PONV (postoperative nausea and vomiting)   ? Seasonal allergies   ? TRANSAMINASES, SERUM, ELEVATED 08/05/2008  ? ? ?Past Surgical History:  ?Procedure Laterality Date  ? AORTIC VALVE REPLACEMENT N/A 02/06/2022  ? Procedure: AORTIC VALVE REPLACEMENT (AVR) WITH 21 MM INSIPIRIS AORTIC VALVE.;  Surgeon: Gaye Pollack, MD;  [facility];  Service: Open Heart Surgery;  Laterality: N/A;  ? COLONOSCOPY  2013  ? Repeat 10 years  ? [place]  ? realignment  ? NOSE SURGERY  2007  ? sinus infections  ? POLYPECTOMY    ? RIGHT HEART CATH AND CORONARY ANGIOGRAPHY N/A 12/01/2021  ? Procedure: RIGHT HEART CATH AND CORONARY ANGIOGRAPHY;  Surgeon: Burnell Blanks, MD;  Location: [place] CV LAB;  Service: Cardiovascular;  Laterality: N/A;  ? skin  cancer removal    ? TEE WITHOUT CARDIOVERSION N/A 02/06/2022  ? Procedure: TRANSESOPHAGEAL ECHOCARDIOGRAM (TEE);  Surgeon: Gaye Pollack, MD;  Location: [place];  Service: Open Heart Surgery;  Laterality: N/A;  ? TONSILLECTOMY    ? ? ?Current Medications: ?Outpatient Medications Prior to Visit  ?Medication Sig Dispense Refill  ? acetaminophen (TYLENOL) 325 MG tablet Take 2 tablets (650 mg total) by mouth every 6 (six) hours as needed for mild pain.    ? albuterol (VENTOLIN HFA) 108 (90 Base) MCG/ACT inhaler Inhale 2 puffs into the lungs every 6 (six) hours as needed for shortness of breath.    ? aspirin  EC 325 MG EC tablet Take 1 tablet (325 mg total) by mouth daily. 30 tablet 0  ? atorvastatin (LIPITOR) 20 MG tablet Take 1 tablet (20 mg total) by mouth daily. 90 tablet 1  ? azelastine (ASTELIN) 0.1 % nasal spray Place 1 spray into both nostrils 2 (two) times daily as needed for rhinitis or allergies. Use in each nostril as directed    ? cetirizine (ZYRTEC) 10 MG tablet Take 10 mg by mouth daily as needed for allergies.    ? ciclopirox (PENLAC) 8 % solution Apply topically at bedtime. Apply over nail and surrounding skin. Apply daily over previous coat. After seven (7) days, may remove with alcohol and continue cycle. 6.6 mL 2  ? citalopram (CELEXA) 40 MG tablet Take 1 tablet (40 mg total) by mouth daily. 90 tablet 1  ? Continuous Blood Gluc Receiver (FREESTYLE LIBRE 14 DAY READER) DEVI USE AS DIRECTED THREE TIMES DAILY. 1 each PRN  ? Continuous Blood Gluc Sensor (FREESTYLE LIBRE 14 DAY SENSOR) MISC USE TO CHECK BLOOD SUGAR THREE TIMES DAILY WITH SLIDING SCALE INSULIN USE. 2 each 2  ? diclofenac (VOLTAREN) 75 MG EC tablet TAKE ONE TABLET BY MOUTH TWICE daily PRN pain. TAKE WITH FOOD. 28 tablet 5  ? Fluticasone-Salmeterol (ADVAIR DISKUS) 250-50 MCG/DOSE AEPB Inhale 1 puff into the lungs 2 (two) times daily. 1 each 3  ? insulin glargine (LANTUS) 100 UNIT/ML Solostar Pen Inject 80 Units into the skin at bedtime. 45 mL 3  ? insulin lispro (HUMALOG) 100 UNIT/ML injection Inject 0.12-0.18 mLs (12-18 Units total) into the skin 3 (three) times daily with meals. Sliding scale if needed    ? levothyroxine (SYNTHROID) 175 MCG tablet Take 1 tablet (175 mcg total) by mouth daily. 90 tablet 1  ? metFORMIN (GLUCOPHAGE) 1000 MG tablet Take 1 tablet (1,000 mg total) by mouth 2 (two) times daily with a meal. 180 tablet 1  ? metoprolol tartrate (LOPRESSOR) 25 MG tablet Take 1 tablet (25 mg total) by mouth 2 (two) times daily. 60 tablet 3  ? omeprazole (PRILOSEC) 40 MG capsule Take 1 capsule (40 mg total) by mouth daily. 90 capsule 1   ? ondansetron (ZOFRAN) 4 MG tablet Take 1 tablet (4 mg total) by mouth every 8 (eight) hours as needed for nausea or vomiting. 20 tablet 0  ? potassium chloride SA (KLOR-CON M) 20 MEQ tablet Take 1 tablet (20 mEq total) by mouth daily. 7 tablet 0  ? Semaglutide,0.25 or 0.'5MG'$ /DOS, (OZEMPIC, 0.25 OR 0.5 MG/DOSE,) 2 MG/1.5ML SOPN Inject 0.5 mg into the skin once a week. 3 mL 3  ? traMADol (ULTRAM) 50 MG tablet Take 1 tablet (50 mg total) by mouth every 6 (six) hours as needed. 30 tablet 0  ? furosemide (LASIX) 40 MG tablet Take 1 tablet (40 mg total) by mouth daily. 7  tablet 0  ? ?No facility-administered medications prior to visit.  ?  ? ?Allergies:   Codeine and Sulfa antibiotics  ? ?Social History  ? ?Socioeconomic History  ? Marital status: Divorced  ?  Spouse name: Not on file  ? Number of children: Not on file  ? Years of education: Not on file  ? Highest education level: Not on file  ?Occupational History  ? Occupation: Building control surveyor at [employer]  ?Tobacco Use  ? Smoking status: Former  ?  Packs/day: 0.50  ?  Years: 5.00  ?  Pack years: 2.50  ?  Types: Cigarettes  ?  Quit date: 10/23/1982  ?  Years since quitting: 39.4  ? Smokeless tobacco: Never  ?Vaping Use  ? Vaping Use: Never used  ?Substance and Sexual Activity  ? Alcohol use: No  ? Drug use: No  ? Sexual activity: Not on file  ?Other Topics Concern  ? Not on file  ?Social History Narrative  ? Not on file  ? ?Social Determinants of Health  ? ?Financial Resource Strain: Not on file  ?Food Insecurity: Not on file  ?Transportation Needs: Not on file  ?Physical Activity: Not on file  ?Stress: Not on file  ?Social Connections: Not on file  ?  ? ?Family History:  The patient's family history includes Allergies in her daughter; Breast cancer in her mother; Cancer in her mother; Diabetes in her mother; Heart attack in her maternal grandmother; Hypertension in her mother; Prostate cancer in her father; Thyroid disease in her mother.  ? ?Review of Systems:    ? ?Please see the history of present illness.    ? ?All other systems reviewed and are otherwise negative except as noted above. ? ? ?Physical Exam:   ? ?VS:  BP 106/70   Pulse 69   Ht '5\' 5"'$  (1.651 m)   Wt 197 lb 3.2

## 2022-03-08 NOTE — Progress Notes (Signed)
? ?                                                    Endocrinology Follow Up Note  ?     03/08/2022, 3:08 PM ? ? ?Subjective:  ? ? Patient ID: Sarah Pope, female    DOB: 1956-10-23.  ?Sarah Pope is being seen in follow up after being seen in consultation for management of currently uncontrolled symptomatic diabetes requested by  [place] Nation, MD. ? ? ?Past Medical History:  ?Diagnosis Date  ? Acute respiratory failure with hypercapnia (HCC)   ? Allergy   ? Anxiety   ? Aortic stenosis   ? a. S/p AVR on 02/06/2022 using a 21 mm Edwards Inspiris Resilina pericardial valve  ? Asthma   ? Bronchitis with bronchospasm   ? Cancer [facility])   ? skin  ? Depression   ? DM type 2 (diabetes mellitus, type 2) ([place])   ? FATTY LIVER DISEASE 08/05/2008  ? GERD (gastroesophageal reflux disease)   ? Hyperlipidemia   ? Hypertension   ? Hypothyroidism   ? Kidney stones   ? Leukocytosis   ? Obesity, Class III, BMI 40-49.9 (morbid obesity) ([place])   ? PERSONAL HX COLONIC POLYPS 09/01/2008  ? PONV (postoperative nausea and vomiting)   ? Seasonal allergies   ? TRANSAMINASES, SERUM, ELEVATED 08/05/2008  ? ? ?Past Surgical History:  ?Procedure Laterality Date  ? AORTIC VALVE REPLACEMENT N/A 02/06/2022  ? Procedure: AORTIC VALVE REPLACEMENT (AVR) WITH 21 MM INSIPIRIS AORTIC VALVE.;  Surgeon: Gaye Pollack, MD;  Location[facility];  Service: Open Heart Surgery;  Laterality: N/A;  ? COLONOSCOPY  2013  ? Repeat 10 years  ? [place]  ? realignment  ? NOSE SURGERY  2007  ? sinus infections  ? POLYPECTOMY    ? RIGHT HEART CATH AND CORONARY ANGIOGRAPHY N/A 12/01/2021  ? Procedure: RIGHT HEART CATH AND CORONARY ANGIOGRAPHY;  Surgeon: Burnell Blanks, MD;  Location: [place] [facility];  Service: Cardiovascular;  Laterality: N/A;  ? skin cancer removal    ? TEE WITHOUT CARDIOVERSION N/A 02/06/2022  ? Procedure: TRANSESOPHAGEAL ECHOCARDIOGRAM (TEE);  Surgeon: Gaye Pollack, MD;  Location: [place];  Service:  Open Heart Surgery;  Laterality: N/A;  ? TONSILLECTOMY    ? ? ?Social History  ? ?Socioeconomic History  ? Marital status: Divorced  ?  Spouse name: Not on file  ? Number of children: Not on file  ? Years of education: Not on file  ? Highest education level: Not on file  ?Occupational History  ? Occupation: Building control surveyor at [employer]  ?Tobacco Use  ? Smoking status: Former  ?  Packs/day: 0.50  ?  Years: 5.00  ?  Pack years: 2.50  ?  Types: Cigarettes  ?  Quit date: 10/23/1982  ?  Years since quitting: 39.4  ? Smokeless tobacco: Never  ?Vaping Use  ? Vaping Use: Never used  ?Substance and Sexual Activity  ? Alcohol use: No  ? Drug use: No  ? Sexual activity: Not on file  ?Other Topics Concern  ? Not on file  ?Social History Narrative  ? Not on file  ? ?Social Determinants of Health  ? ?Financial Resource Strain: Not on file  ?Food Insecurity: Not on file  ?Transportation  Needs: Not on file  ?Physical Activity: Not on file  ?Stress: Not on file  ?Social Connections: Not on file  ? ? ?Family History  ?Problem Relation Age of Onset  ? Cancer Mother   ? Hypertension Mother   ? Thyroid disease Mother   ? Breast cancer Mother   ? Diabetes Mother   ? Prostate cancer Father   ? Heart attack Maternal Grandmother   ? Allergies Daughter   ? Colon cancer Neg Hx   ? Esophageal cancer Neg Hx   ? Rectal cancer Neg Hx   ? Stomach cancer Neg Hx   ? Colon polyps Neg Hx   ? ? ?Outpatient Encounter Medications as of 03/08/2022  ?Medication Sig  ? insulin glargine (LANTUS) 100 UNIT/ML Solostar Pen Inject 80 Units into the skin at bedtime.  ? Semaglutide,0.25 or 0.'5MG'$ /DOS, (OZEMPIC, 0.25 OR 0.5 MG/DOSE,) 2 MG/1.5ML SOPN Inject 0.5 mg into the skin once a week.  ? acetaminophen (TYLENOL) 325 MG tablet Take 2 tablets (650 mg total) by mouth every 6 (six) hours as needed for mild pain.  ? albuterol (VENTOLIN HFA) 108 (90 Base) MCG/ACT inhaler Inhale 2 puffs into the lungs every 6 (six) hours as needed for shortness of breath.  ? aspirin EC  325 MG EC tablet Take 1 tablet (325 mg total) by mouth daily.  ? atorvastatin (LIPITOR) 20 MG tablet Take 1 tablet (20 mg total) by mouth daily.  ? azelastine (ASTELIN) 0.1 % nasal spray Place 1 spray into both nostrils 2 (two) times daily as needed for rhinitis or allergies. Use in each nostril as directed  ? cetirizine (ZYRTEC) 10 MG tablet Take 10 mg by mouth daily as needed for allergies.  ? ciclopirox (PENLAC) 8 % solution Apply topically at bedtime. Apply over nail and surrounding skin. Apply daily over previous coat. After seven (7) days, may remove with alcohol and continue cycle.  ? citalopram (CELEXA) 40 MG tablet Take 1 tablet (40 mg total) by mouth daily.  ? Continuous Blood Gluc Receiver (FREESTYLE LIBRE 14 DAY READER) DEVI USE AS DIRECTED THREE TIMES DAILY.  ? Continuous Blood Gluc Sensor (FREESTYLE LIBRE 14 DAY SENSOR) MISC USE TO CHECK BLOOD SUGAR THREE TIMES DAILY WITH SLIDING SCALE INSULIN USE.  ? diclofenac (VOLTAREN) 75 MG EC tablet TAKE ONE TABLET BY MOUTH TWICE daily PRN pain. TAKE WITH FOOD.  ? Fluticasone-Salmeterol (ADVAIR DISKUS) 250-50 MCG/DOSE AEPB Inhale 1 puff into the lungs 2 (two) times daily.  ? insulin lispro (HUMALOG) 100 UNIT/ML injection Inject 0.12-0.18 mLs (12-18 Units total) into the skin 3 (three) times daily with meals. Sliding scale if needed  ? levothyroxine (SYNTHROID) 175 MCG tablet Take 1 tablet (175 mcg total) by mouth daily.  ? metFORMIN (GLUCOPHAGE) 1000 MG tablet Take 1 tablet (1,000 mg total) by mouth 2 (two) times daily with a meal.  ? metoprolol tartrate (LOPRESSOR) 25 MG tablet Take 1 tablet (25 mg total) by mouth 2 (two) times daily.  ? omeprazole (PRILOSEC) 40 MG capsule Take 1 capsule (40 mg total) by mouth daily.  ? ondansetron (ZOFRAN) 4 MG tablet Take 1 tablet (4 mg total) by mouth every 8 (eight) hours as needed for nausea or vomiting.  ? potassium chloride SA (KLOR-CON M) 20 MEQ tablet Take 1 tablet (20 mEq total) by mouth daily.  ? traMADol (ULTRAM) 50  MG tablet Take 1 tablet (50 mg total) by mouth every 6 (six) hours as needed.  ? [DISCONTINUED] furosemide (LASIX) 40 MG tablet Take  1 tablet (40 mg total) by mouth daily.  ? [DISCONTINUED] insulin glargine (LANTUS) 100 unit/mL SOPN Inject 80 Units into the skin daily. (Patient taking differently: Inject 80 Units into the skin at bedtime.)  ? ?No facility-administered encounter medications on file as of 03/08/2022.  ? ? ?ALLERGIES: ?Allergies  ?Allergen Reactions  ? Codeine Nausea Only  ? Sulfa Antibiotics   ?  itching  ? ? ?VACCINATION STATUS: ?Immunization History  ?Administered Date(s) Administered  ? Influenza Split 07/03/2012, 09/10/2013  ? Influenza,inj,Quad PF,6+ Mos 09/29/2015, 06/27/2016, 08/15/2017, 08/28/2018, 07/14/2019, 08/09/2020  ? Influenza-Unspecified 08/24/2011, 08/11/2020  ? Moderna Sars-Covid-2 Vaccination 04/05/2020, 04/28/2020  ? PNEUMOCOCCAL CONJUGATE-20 02/11/2022  ? Pneumococcal Polysaccharide-23 08/23/2004, 08/28/2018  ? Td 06/17/2008  ? Tdap 05/09/2019  ? Zoster Recombinat (Shingrix) 10/14/2019, 08/04/2020, 08/11/2020  ? ? ?Diabetes ?She presents for her follow-up diabetic visit. She has type 2 diabetes mellitus. Onset time: Diagnosed at approx age of 89. Her disease course has been improving. There are no hypoglycemic associated symptoms. Associated symptoms include fatigue and foot paresthesias. Pertinent negatives for diabetes include no polydipsia, no polyuria and no weight loss. There are no hypoglycemic complications. Symptoms are stable. Diabetic complications include nephropathy and peripheral neuropathy. Risk factors for coronary artery disease include stress, diabetes mellitus, dyslipidemia, family history, obesity, hypertension, post-menopausal and sedentary lifestyle. Current diabetic treatment includes intensive insulin program and oral agent (monotherapy). She is compliant with treatment most of the time. Her weight is fluctuating minimally. She is following a generally  unhealthy diet. When asked about meal planning, she reported none. She has not had a previous visit with a dietitian. She rarely participates in exercise. Her overall blood glucose range is 180-200 mg/dl. (She

## 2022-03-08 NOTE — Patient Instructions (Signed)
Diabetes Mellitus and Foot Care Foot care is an important part of your health, especially when you have diabetes. Diabetes may cause you to have problems because of poor blood flow (circulation) to your feet and legs, which can cause your skin to: Become thinner and drier. Break more easily. Heal more slowly. Peel and crack. You may also have nerve damage (neuropathy) in your legs and feet, causing decreased feeling in them. This means that you may not notice minor injuries to your feet that could lead to more serious problems. Noticing and addressing any potential problems early is the best way to prevent future foot problems. How to care for your feet Foot hygiene  Wash your feet daily with warm water and mild soap. Do not use hot water. Then, pat your feet and the areas between your toes until they are completely dry. Do not soak your feet as this can dry your skin. Trim your toenails straight across. Do not dig under them or around the cuticle. File the edges of your nails with an emery board or nail file. Apply a moisturizing lotion or petroleum jelly to the skin on your feet and to dry, brittle toenails. Use lotion that does not contain alcohol and is unscented. Do not apply lotion between your toes. Shoes and socks Wear clean socks or stockings every day. Make sure they are not too tight. Do not wear knee-high stockings since they may decrease blood flow to your legs. Wear shoes that fit properly and have enough cushioning. Always look in your shoes before you put them on to be sure there are no objects inside. To break in new shoes, wear them for just a few hours a day. This prevents injuries on your feet. Wounds, scrapes, corns, and calluses  Check your feet daily for blisters, cuts, bruises, sores, and redness. If you cannot see the bottom of your feet, use a mirror or ask someone for help. Do not cut corns or calluses or try to remove them with medicine. If you find a minor scrape,  cut, or break in the skin on your feet, keep it and the skin around it clean and dry. You may clean these areas with mild soap and water. Do not clean the area with peroxide, alcohol, or iodine. If you have a wound, scrape, corn, or callus on your foot, look at it several times a day to make sure it is healing and not infected. Check for: Redness, swelling, or pain. Fluid or blood. Warmth. Pus or a bad smell. General tips Do not cross your legs. This may decrease blood flow to your feet. Do not use heating pads or hot water bottles on your feet. They may burn your skin. If you have lost feeling in your feet or legs, you may not know this is happening until it is too late. Protect your feet from hot and cold by wearing shoes, such as at the beach or on hot pavement. Schedule a complete foot exam at least once a year (annually) or more often if you have foot problems. Report any cuts, sores, or bruises to your health care provider immediately. Where to find more information American Diabetes Association: www.diabetes.org Association of Diabetes Care & Education Specialists: www.diabeteseducator.org Contact a health care provider if: You have a medical condition that increases your risk of infection and you have any cuts, sores, or bruises on your feet. You have an injury that is not healing. You have redness on your legs or feet. You   feel burning or tingling in your legs or feet. You have pain or cramps in your legs and feet. Your legs or feet are numb. Your feet always feel cold. You have pain around any toenails. Get help right away if: You have a wound, scrape, corn, or callus on your foot and: You have pain, swelling, or redness that gets worse. You have fluid or blood coming from the wound, scrape, corn, or callus. Your wound, scrape, corn, or callus feels warm to the touch. You have pus or a bad smell coming from the wound, scrape, corn, or callus. You have a fever. You have a red  line going up your leg. Summary Check your feet every day for blisters, cuts, bruises, sores, and redness. Apply a moisturizing lotion or petroleum jelly to the skin on your feet and to dry, brittle toenails. Wear shoes that fit properly and have enough cushioning. If you have foot problems, report any cuts, sores, or bruises to your health care provider immediately. Schedule a complete foot exam at least once a year (annually) or more often if you have foot problems. This information is not intended to replace advice given to you by your health care provider. Make sure you discuss any questions you have with your health care provider. Document Revised: 04/29/2020 Document Reviewed: 04/29/2020 Elsevier Patient Education  2023 Elsevier Inc.  

## 2022-03-08 NOTE — Patient Instructions (Signed)
Medication Instructions:  ? ?Reduce LOSARTAN to '25mg'$  daily. Can cut your current tablets in half.  ? ?*If you need a refill on your cardiac medications before your next appointment, please call your pharmacy* ? ? ?Lab Work: ? ?None.  ? ?Testing/Procedures: ? ?Keep scheduled follow-up for your echo on 03/20/2022. ? ?Follow-Up: ? ?Your next appointment:   ? ?Keep scheduled follow-up with Dr. Harl Bowie on 04/07/2022 at 2:00 in the [facility].  ? ?Important Information About Sugar ? ? ? ? ? ? ?

## 2022-03-20 ENCOUNTER — Other Ambulatory Visit (HOSPITAL_COMMUNITY): Payer: HMO

## 2022-03-21 ENCOUNTER — Ambulatory Visit (HOSPITAL_COMMUNITY)
Admission: RE | Admit: 2022-03-21 | Discharge: 2022-03-21 | Disposition: A | Discharge status: Home / Self Care | Payer: HMO | Source: Ambulatory Visit | Attending: Cardiology | Admitting: Cardiology

## 2022-03-21 DIAGNOSIS — I350 Nonrheumatic aortic (valve) stenosis: Secondary | ICD-10-CM | POA: Insufficient documentation

## 2022-03-21 DIAGNOSIS — Z952 Presence of prosthetic heart valve: Secondary | ICD-10-CM | POA: Diagnosis not present

## 2022-03-21 LAB — ECHOCARDIOGRAM COMPLETE
AR max vel: 1.91 cm2
AV Area VTI: 1.79 cm2
AV Area mean vel: 1.71 cm2
AV Mean grad: 9.5 mmHg
AV Peak grad: 19 mmHg
Ao pk vel: 2.18 m/s
Area-P 1/2: 3.2 cm2
Calc EF: 55.1 %
MV VTI: 2.22 cm2
S' Lateral: 2.6 cm
Single Plane A2C EF: 44.8 %
Single Plane A4C EF: 64.2 %

## 2022-03-21 NOTE — Progress Notes (Signed)
*  PRELIMINARY RESULTS* Echocardiogram 2D Echocardiogram has been performed.  Elpidio Anis 03/21/2022, 10:20 AM

## 2022-04-07 ENCOUNTER — Ambulatory Visit (INDEPENDENT_AMBULATORY_CARE_PROVIDER_SITE_OTHER): Payer: HMO | Admitting: Cardiology

## 2022-04-07 ENCOUNTER — Encounter: Payer: Self-pay | Admitting: Cardiology

## 2022-04-07 VITALS — BP 100/64 | HR 82 | Ht 65.0 in | Wt 194.6 lb

## 2022-04-07 DIAGNOSIS — I10 Essential (primary) hypertension: Secondary | ICD-10-CM

## 2022-04-07 DIAGNOSIS — I350 Nonrheumatic aortic (valve) stenosis: Secondary | ICD-10-CM

## 2022-04-07 DIAGNOSIS — Z952 Presence of prosthetic heart valve: Secondary | ICD-10-CM | POA: Diagnosis not present

## 2022-04-07 NOTE — Progress Notes (Signed)
Clinical Summary Ms. Darwish is a 66 y.o.female seen today for follow up of the following medical problems.    1. Aortic stenosis s/p SAVR 01/2022 AVR surgery with 21 mm Edwards INSPIRIS RESILIA pericardial valve 02/2022 LVEF 60-65%, no WMAs, grade I dd, normal RV function. Normal AVR  - no SOB/DOE.    2. HTN - compliant with meds - low bp's at 02/2022 visit, meds have been cut back - some lightheadedness/dizziness at times.     3. Carotid stenosis - 11/2020 carotid US: RICA 3-54%, LICA NL 03/5680 2-75% bilateral ICA disease  4. Hyperlipidemia - 03/2021 TC 165 TG 159 HDL 45 LDL 92 Past Medical History:  Diagnosis Date   Acute respiratory failure with hypercapnia (HCC)    Allergy    Anxiety    Aortic stenosis    a. S/p AVR on 02/06/2022 using a 21 mm Edwards Inspiris Resilina pericardial valve   Asthma    Bronchitis with bronchospasm    Cancer ([place])    skin   Depression    DM type 2 (diabetes mellitus, type 2) ([place])    FATTY LIVER DISEASE 08/05/2008   GERD (gastroesophageal reflux disease)    Hyperlipidemia    Hypertension    Hypothyroidism    Kidney stones    Leukocytosis    Obesity, Class III, BMI 40-49.9 (morbid obesity) ([place])    PERSONAL HX COLONIC POLYPS 09/01/2008   PONV (postoperative nausea and vomiting)    Seasonal allergies    TRANSAMINASES, SERUM, ELEVATED 08/05/2008     Allergies  Allergen Reactions   Codeine Nausea Only   Sulfa Antibiotics Itching     Current Outpatient Medications  Medication Sig Dispense Refill   acetaminophen (TYLENOL) 325 MG tablet Take 2 tablets (650 mg total) by mouth every 6 (six) hours as needed for mild pain.     albuterol (VENTOLIN HFA) 108 (90 Base) MCG/ACT inhaler Inhale 2 puffs into the lungs every 6 (six) hours as needed for shortness of breath.     amoxicillin (AMOXIL) 500 MG capsule Take 500 mg by mouth 3 (three) times daily.     aspirin EC 325 MG EC tablet Take 1 tablet (325 mg total) by mouth daily. (Patient  not taking: Reported on 04/05/2022) 30 tablet 0   aspirin EC 81 MG tablet Take 81 mg by mouth daily. Swallow whole.     atorvastatin (LIPITOR) 20 MG tablet Take 1 tablet (20 mg total) by mouth daily. 90 tablet 1   azelastine (ASTELIN) 0.1 % nasal spray Place 1 spray into both nostrils 2 (two) times daily as needed for rhinitis or allergies. Use in each nostril as directed     cetirizine (ZYRTEC) 10 MG tablet Take 10 mg by mouth daily as needed for allergies.     ciclopirox (PENLAC) 8 % solution Apply topically at bedtime. Apply over nail and surrounding skin. Apply daily over previous coat. After seven (7) days, may remove with alcohol and continue cycle. (Patient taking differently: Apply 1 Application topically 3 (three) times a week. Apply over nail and surrounding skin. Apply daily over previous coat. After seven (7) days, may remove with alcohol and continue cycle.) 6.6 mL 2   citalopram (CELEXA) 40 MG tablet Take 1 tablet (40 mg total) by mouth daily. 90 tablet 1   Continuous Blood Gluc Receiver (FREESTYLE LIBRE 14 DAY READER) DEVI USE AS DIRECTED THREE TIMES DAILY. 1 each PRN   Continuous Blood Gluc Sensor (FREESTYLE LIBRE 14 DAY SENSOR)  MISC USE TO CHECK BLOOD SUGAR THREE TIMES DAILY WITH SLIDING SCALE INSULIN USE. 2 each 2   diclofenac (VOLTAREN) 75 MG EC tablet TAKE ONE TABLET BY MOUTH TWICE daily PRN pain. TAKE WITH FOOD. (Patient taking differently: Take 75 mg by mouth See admin instructions. Take 1 tablet (75 mg) by mouth (scheduled) every morning, may take an additional dose if needed for pain.) 28 tablet 5   Fluticasone-Salmeterol (ADVAIR DISKUS) 250-50 MCG/DOSE AEPB Inhale 1 puff into the lungs 2 (two) times daily. (Patient taking differently: Inhale 1 puff into the lungs 2 (two) times daily as needed (respiratory issues.).) 1 each 3   hydrochlorothiazide (HYDRODIURIL) 12.5 MG tablet Take 12.5 mg by mouth in the morning.     insulin glargine (LANTUS) 100 UNIT/ML Solostar Pen Inject 80  Units into the skin at bedtime. 45 mL 3   insulin lispro (HUMALOG) 100 UNIT/ML injection Inject 0.12-0.18 mLs (12-18 Units total) into the skin 3 (three) times daily with meals. Sliding scale if needed     levothyroxine (SYNTHROID) 175 MCG tablet Take 1 tablet (175 mcg total) by mouth daily. 90 tablet 1   losartan (COZAAR) 25 MG tablet Take 1 tablet (25 mg total) by mouth daily. 90 tablet 3   metFORMIN (GLUCOPHAGE) 1000 MG tablet Take 1 tablet (1,000 mg total) by mouth 2 (two) times daily with a meal. (Patient taking differently: Take 1,000 mg by mouth See admin instructions. Take 1 tablet (1000 mg) by mouth every morning (scheduled), may take an additional dose in the evening if blood glucose reading are >175.) 180 tablet 1   metoprolol tartrate (LOPRESSOR) 25 MG tablet Take 1 tablet (25 mg total) by mouth 2 (two) times daily. 60 tablet 3   omeprazole (PRILOSEC) 40 MG capsule Take 1 capsule (40 mg total) by mouth daily. 90 capsule 1   ondansetron (ZOFRAN) 4 MG tablet Take 1 tablet (4 mg total) by mouth every 8 (eight) hours as needed for nausea or vomiting. 20 tablet 0   potassium chloride SA (KLOR-CON M) 20 MEQ tablet Take 1 tablet (20 mEq total) by mouth daily. 7 tablet 0   Semaglutide,0.25 or 0.'5MG'$ /DOS, (OZEMPIC, 0.25 OR 0.5 MG/DOSE,) 2 MG/1.5ML SOPN Inject 0.5 mg into the skin once a week. (Patient taking differently: Inject 0.5 mg into the skin every Thursday.) 3 mL 3   traMADol (ULTRAM) 50 MG tablet Take 1 tablet (50 mg total) by mouth every 6 (six) hours as needed. 30 tablet 0   No current facility-administered medications for this visit.     Past Surgical History:  Procedure Laterality Date   AORTIC VALVE REPLACEMENT N/A 02/06/2022   Procedure: AORTIC VALVE REPLACEMENT (AVR) WITH 21 MM INSIPIRIS AORTIC VALVE.;  Surgeon: Gaye Pollack, MD;  Location: [facility] OR;  Service: Open Heart Surgery;  Laterality: N/A;   COLONOSCOPY  2013   Repeat 10 years   [place]   realignment    NOSE SURGERY  2007   sinus infections   POLYPECTOMY     RIGHT HEART CATH AND CORONARY ANGIOGRAPHY N/A 12/01/2021   Procedure: RIGHT HEART CATH AND CORONARY ANGIOGRAPHY;  Surgeon: Burnell Blanks, MD;  Location: [facility];  Service: Cardiovascular;  Laterality: N/A;   skin cancer removal     TEE WITHOUT CARDIOVERSION N/A 02/06/2022   Procedure: TRANSESOPHAGEAL ECHOCARDIOGRAM (TEE);  Surgeon: Gaye Pollack, MD;  Location: [facility];  Service: Open Heart Surgery;  Laterality: N/A;   TONSILLECTOMY  Allergies  Allergen Reactions   Codeine Nausea Only   Sulfa Antibiotics Itching      Family History  Problem Relation Age of Onset   Cancer Mother    Hypertension Mother    Thyroid disease Mother    Breast cancer Mother    Diabetes Mother    Prostate cancer Father    Heart attack Maternal Grandmother    Allergies Daughter    Colon cancer Neg Hx    Esophageal cancer Neg Hx    Rectal cancer Neg Hx    Stomach cancer Neg Hx    Colon polyps Neg Hx      Social History Ms. Peterka reports that she quit smoking about 39 years ago. Her smoking use included cigarettes. She has a 2.50 pack-year smoking history. She has never used smokeless tobacco. Ms. Dolliver reports no history of alcohol use.   Review of Systems CONSTITUTIONAL: No weight loss, fever, chills, weakness or fatigue.  HEENT: Eyes: No visual loss, blurred vision, double vision or yellow sclerae.No hearing loss, sneezing, congestion, runny nose or sore throat.  SKIN: No rash or itching.  CARDIOVASCULAR: per hpi RESPIRATORY: No shortness of breath, cough or sputum.  GASTROINTESTINAL: No anorexia, nausea, vomiting or diarrhea. No abdominal pain or blood.  GENITOURINARY: No burning on urination, no polyuria NEUROLOGICAL: No headache, dizziness, syncope, paralysis, ataxia, numbness or tingling in the extremities. No change in bowel or bladder control.  MUSCULOSKELETAL: No muscle, back pain, joint pain or stiffness.   LYMPHATICS: No enlarged nodes. No history of splenectomy.  PSYCHIATRIC: No history of depression or anxiety.  ENDOCRINOLOGIC: No reports of sweating, cold or heat intolerance. No polyuria or polydipsia.  Marland Kitchen   Physical Examination Today's Vitals   04/07/22 1351  BP: 100/64  Pulse: 82  SpO2: 94%  Weight: 194 lb 9.6 oz (88.3 kg)  Height: '5\' 5"'$  (1.651 m)   Body mass index is 32.38 kg/m.  Gen: resting comfortably, no acute distress HEENT: no scleral icterus, pupils equal round and reactive, no palptable cervical adenopathy,  CV: RRR, no m/r/g, no jvd Resp: Clear to auscultation bilaterally GI: abdomen is soft, non-tender, non-distended, normal bowel sounds, no hepatosplenomegaly MSK: extremities are warm, no edema.  Skin: warm, no rash Neuro:  no focal deficits Psych: appropriate affect   Diagnostic Studies 05/2020 echo IMPRESSIONS     1. Left ventricular ejection fraction, by estimation, is 65 to 70%. The  left ventricle has normal function. The left ventricle has no regional  wall motion abnormalities. There is mild left ventricular hypertrophy.  Left ventricular diastolic parameters  are consistent with Grade I diastolic dysfunction (impaired relaxation).   2. Right ventricular systolic function is normal. The right ventricular  size is normal. There is normal pulmonary artery systolic pressure. The  estimated right ventricular systolic pressure is 56.4 mmHg.   3. The mitral valve is grossly normal. Trivial mitral valve  regurgitation.   4. The aortic valve is tricuspid, moderately calcified with decreased  cusp excursion. Aortic valve regurgitation is trivial. Moderate to severe  aortic valve stenosis. Aortic valve area, by VTI measures 0.77 cm. Aortic  valve mean gradient measures 29.5  mmHg. Aortic valve Vmax measures 3.64 m/s. Dimentionless index 0.30.   5. The inferior vena cava is normal in size with greater than 50%  respiratory variability, suggesting right  atrial pressure of 3 mmHg.   11/2020 echo IMPRESSIONS     1. Left ventricular ejection fraction, by estimation, is 55 to 60%. The  left ventricle has normal function. The left ventricle has no regional  wall motion abnormalities. Left ventricular diastolic parameters are  consistent with Grade I diastolic  dysfunction (impaired relaxation). Elevated left atrial pressure.   2. Right ventricular systolic function is normal. The right ventricular  size is normal.   3. The mitral valve is normal in structure. No evidence of mitral valve  regurgitation. No evidence of mitral stenosis.   4. The aortic valve is tricuspid. There is mild calcification of the  aortic valve. There is mild thickening of the aortic valve. Aortic valve  regurgitation is not visualized. Moderate to severe aortic valve stenosis.  Aortic valve mean gradient measures  29.6 mmHg. Aortic valve peak gradient measures 49.4 mmHg. Aortic valve  area, by VTI measures 0.92 cm.      09/2021 echo IMPRESSIONS     1. Left ventricular ejection fraction, by estimation, is 55 to 60%. The  left ventricle has normal function. The left ventricle has no regional  wall motion abnormalities. There is mild left ventricular hypertrophy.  Left ventricular diastolic parameters  are consistent with Grade I diastolic dysfunction (impaired relaxation).   2. Right ventricular systolic function is normal. The right ventricular  size is normal. There is normal pulmonary artery systolic pressure.   3. The mitral valve is abnormal. No evidence of mitral valve  regurgitation. No evidence of mitral stenosis. Moderate mitral annular  calcification.   4. Morphologically tri leaflet vavle heavily calcified. Gradients stable  since February 2022 but smaller LVOT diameter used DVI/AVA and peak  velocity from suprasternal notch ( 4 m/sec ) as well as morphology suggest  severe AS despite mean gradient of  28.7 mmHhg Suggest right and left cath to r/o  CAD and further evaluate .  The aortic valve is tricuspid. There is severe calcifcation of the aortic  valve. There is severe thickening of the aortic valve. Aortic valve  regurgitation is trivial. Moderate to  severe aortic valve stenosis.   5. The inferior vena cava is normal in size with greater than 50%  respiratory variability, suggesting right atrial pressure of 3 mmHg.     Assessment and Plan    1. Aortic stenosis s/p AVR - s/p SAVR with tissue valve, doing well.  - f/u echo shows normal functioning AVR - to start cardiac rehab next week  2. HTN - more recent issues with low bp's since surgery, have been cutting back meds - bp 100/64 today, some dizzienss at times. Will d/c HCTZ     F/u 32month   JArnoldo Lenis M.D.

## 2022-04-07 NOTE — Patient Instructions (Addendum)
Medication Instructions:  Stop HCTZ (Hydrochlorothiazide). Continue all other medications.    Labwork: none  Testing/Procedures: none  Follow-Up: 6 months   Any Other Special Instructions Will Be Listed Below (If Applicable).   If you need a refill on your cardiac medications before your next appointment, please call your pharmacy.

## 2022-04-11 ENCOUNTER — Encounter (HOSPITAL_COMMUNITY)
Admission: RE | Admit: 2022-04-11 | Discharge: 2022-04-11 | Disposition: A | Discharge status: Home / Self Care | Payer: HMO | Source: Ambulatory Visit | Attending: Cardiology | Admitting: Cardiology

## 2022-04-11 ENCOUNTER — Encounter (HOSPITAL_COMMUNITY): Payer: Self-pay

## 2022-04-11 VITALS — BP 96/64 | HR 75 | Ht 65.0 in | Wt 195.5 lb

## 2022-04-11 DIAGNOSIS — Z952 Presence of prosthetic heart valve: Secondary | ICD-10-CM | POA: Diagnosis not present

## 2022-04-11 NOTE — Progress Notes (Signed)
Cardiac Individual Treatment Plan  Patient Details  Name: Sarah Pope MRN: [id number] Date of Birth: 1956/03/10 Referring Provider:   Flowsheet Row CARDIAC REHAB PHASE II ORIENTATION from 04/11/2022 in [facility]  Referring Provider Dr. Cyndia Bent       Initial Encounter Date:  Flowsheet Row CARDIAC REHAB PHASE II ORIENTATION from 04/11/2022 in [facility]  Date 04/11/22       Visit Diagnosis: S/P AVR  Patient's Home Medications on Admission:  Current Outpatient Medications:    acetaminophen (TYLENOL) 325 MG tablet, Take 2 tablets (650 mg total) by mouth every 6 (six) hours as needed for mild pain., Disp: , Rfl:    albuterol (VENTOLIN HFA) 108 (90 Base) MCG/ACT inhaler, Inhale 2 puffs into the lungs every 6 (six) hours as needed for shortness of breath., Disp: , Rfl:    amoxicillin (AMOXIL) 500 MG capsule, Take 500 mg by mouth 3 (three) times daily., Disp: , Rfl:    aspirin EC 81 MG tablet, Take 81 mg by mouth daily. Swallow whole., Disp: , Rfl:    atorvastatin (LIPITOR) 20 MG tablet, Take 1 tablet (20 mg total) by mouth daily., Disp: 90 tablet, Rfl: 1   azelastine (ASTELIN) 0.1 % nasal spray, Place 1 spray into both nostrils 2 (two) times daily as needed for rhinitis or allergies. Use in each nostril as directed, Disp: , Rfl:    cetirizine (ZYRTEC) 10 MG tablet, Take 10 mg by mouth daily as needed for allergies., Disp: , Rfl:    ciclopirox (PENLAC) 8 % solution, Apply topically at bedtime. Apply over nail and surrounding skin. Apply daily over previous coat. After seven (7) days, may remove with alcohol and continue cycle. (Patient taking differently: Apply 1 Application topically 3 (three) times a week. Apply over nail and surrounding skin. Apply daily over previous coat. After seven (7) days, may remove with alcohol and continue cycle.), Disp: 6.6 mL, Rfl: 2   citalopram (CELEXA) 40 MG tablet, Take 1 tablet (40 mg total) by mouth daily.,  Disp: 90 tablet, Rfl: 1   diclofenac (VOLTAREN) 75 MG EC tablet, TAKE ONE TABLET BY MOUTH TWICE daily PRN pain. TAKE WITH FOOD. (Patient taking differently: Take 75 mg by mouth See admin instructions. Take 1 tablet (75 mg) by mouth (scheduled) every morning, may take an additional dose if needed for pain.), Disp: 28 tablet, Rfl: 5   Fluticasone-Salmeterol (ADVAIR DISKUS) 250-50 MCG/DOSE AEPB, Inhale 1 puff into the lungs 2 (two) times daily. (Patient taking differently: Inhale 1 puff into the lungs 2 (two) times daily as needed (respiratory issues.).), Disp: 1 each, Rfl: 3   insulin glargine (LANTUS) 100 UNIT/ML Solostar Pen, Inject 80 Units into the skin at bedtime., Disp: 45 mL, Rfl: 3   insulin lispro (HUMALOG) 100 UNIT/ML injection, Inject 0.12-0.18 mLs (12-18 Units total) into the skin 3 (three) times daily with meals. Sliding scale if needed, Disp: , Rfl:    levothyroxine (SYNTHROID) 175 MCG tablet, Take 1 tablet (175 mcg total) by mouth daily., Disp: 90 tablet, Rfl: 1   losartan (COZAAR) 25 MG tablet, Take 1 tablet (25 mg total) by mouth daily., Disp: 90 tablet, Rfl: 3   metFORMIN (GLUCOPHAGE) 1000 MG tablet, Take 1 tablet (1,000 mg total) by mouth 2 (two) times daily with a meal. (Patient taking differently: Take 1,000 mg by mouth as needed.), Disp: 180 tablet, Rfl: 1   metoprolol tartrate (LOPRESSOR) 25 MG tablet, Take 1 tablet (25 mg total) by mouth 2 (  two) times daily., Disp: 60 tablet, Rfl: 3   omeprazole (PRILOSEC) 40 MG capsule, Take 1 capsule (40 mg total) by mouth daily., Disp: 90 capsule, Rfl: 1   ondansetron (ZOFRAN) 4 MG tablet, Take 1 tablet (4 mg total) by mouth every 8 (eight) hours as needed for nausea or vomiting., Disp: 20 tablet, Rfl: 0   Semaglutide,0.25 or 0.'5MG'$ /DOS, (OZEMPIC, 0.25 OR 0.5 MG/DOSE,) 2 MG/1.5ML SOPN, Inject 0.5 mg into the skin once a week. (Patient taking differently: Inject 0.5 mg into the skin every Thursday.), Disp: 3 mL, Rfl: 3   Continuous Blood Gluc  Receiver (FREESTYLE LIBRE 14 DAY READER) DEVI, USE AS DIRECTED THREE TIMES DAILY., Disp: 1 each, Rfl: PRN   Continuous Blood Gluc Sensor (FREESTYLE LIBRE 14 DAY SENSOR) MISC, USE TO CHECK BLOOD SUGAR THREE TIMES DAILY WITH SLIDING SCALE INSULIN USE., Disp: 2 each, Rfl: 2   potassium chloride SA (KLOR-CON M) 20 MEQ tablet, Take 1 tablet (20 mEq total) by mouth daily., Disp: 7 tablet, Rfl: 0   traMADol (ULTRAM) 50 MG tablet, Take 1 tablet (50 mg total) by mouth every 6 (six) hours as needed., Disp: 30 tablet, Rfl: 0  Past Medical History: Past Medical History:  Diagnosis Date   Acute respiratory failure with hypercapnia (HCC)    Allergy    Anxiety    Aortic stenosis    a. S/p AVR on 02/06/2022 using a 21 mm Edwards Inspiris Resilina pericardial valve   Asthma    Bronchitis with bronchospasm    Cancer ([facility])    skin   Depression    DM type 2 (diabetes mellitus, type 2) ([facility])    FATTY LIVER DISEASE 08/05/2008   GERD (gastroesophageal reflux disease)    Hyperlipidemia    Hypertension    Hypothyroidism    Kidney stones    Leukocytosis    Obesity, Class III, BMI 40-49.9 (morbid obesity) ([facility])    PERSONAL HX COLONIC POLYPS 09/01/2008   PONV (postoperative nausea and vomiting)    Seasonal allergies    TRANSAMINASES, SERUM, ELEVATED 08/05/2008    Tobacco Use: Social History   Tobacco Use  Smoking Status Former   Packs/day: 0.50   Years: 5.00   Total pack years: 2.50   Types: Cigarettes   Quit date: 10/23/1982   Years since quitting: 39.4  Smokeless Tobacco Never    Labs: Review Flowsheet  More data exists      Latest Ref Rng & Units 08/01/2021 10/25/2021 12/01/2021 02/02/2022  Labs for ITP Cardiac and Pulmonary Rehab  Hemoglobin A1c 4.8 - 5.6 % 10.2  9.0  - 8.6   PH, Arterial 7.35 - 7.45 - - 7.342  7.41   PCO2 arterial 32 - 48 mmHg - - 48.7  39   Bicarbonate 20.0 - 28.0 mmol/L - - 26.4  28.3  24.7   TCO2 22 - 32 mmol/L - - 28  30  -  Acid-base deficit 0.0 - 2.0 mmol/L - - - -   O2 Saturation % - - 99.0  76.0  98.6       02/06/2022  Labs for ITP Cardiac and Pulmonary Rehab  Hemoglobin A1c -  PH, Arterial 7.308  7.286  7.321  7.417  7.431  7.479  7.486  7.383   PCO2 arterial 50.2  50.5  46.3  42.7  43.0  37.1  36.7  46.0   Bicarbonate 25.2  24.1  24.2  27.5  28.6  26.8  27.6  27.7  27.4  TCO2 '27  26  26  27  29  30  29  28  28  29  29  26  28  29   '$ Acid-base deficit 1.0  3.0  2.0   O2 Saturation 95  97  93  100  100  83  100  100  100     Capillary Blood Glucose: Lab Results  Component Value Date   GLUCAP 100 (H) 02/11/2022   GLUCAP 226 (H) 02/10/2022   GLUCAP 278 (H) 02/10/2022   GLUCAP 145 (H) 02/10/2022   GLUCAP 95 02/10/2022    POCT Glucose     Row Name 04/11/22 0842             POCT Blood Glucose   Pre-Exercise 151 mg/dL                Exercise Target Goals: Exercise Program Goal: Individual exercise prescription set using results from initial 6 min walk test and THRR while considering  patient's activity barriers and safety.   Exercise Prescription Goal: Starting with aerobic activity 30 plus minutes a day, 3 days per week for initial exercise prescription. Provide home exercise prescription and guidelines that participant acknowledges understanding prior to discharge.  Activity Barriers & Risk Stratification:  Activity Barriers & Cardiac Risk Stratification - 04/11/22 0901       Activity Barriers & Cardiac Risk Stratification   Activity Barriers Joint Problems;Balance Concerns    Cardiac Risk Stratification High             6 Minute Walk:  6 Minute Walk     Row Name 04/11/22 0947         6 Minute Walk   Phase Initial     Distance 1200 feet     Walk Time 6 minutes     # of Rest Breaks 0     MPH 2.27     METS 2.5     RPE 13     VO2 Peak 8.79     Symptoms No     Resting HR 72 bpm     Resting BP 94/64     Resting Oxygen Saturation  97 %     Exercise Oxygen Saturation  during 6 min walk 100 %     Max Ex. HR  85 bpm     Max Ex. BP 118/60     2 Minute Post BP 100/60              Oxygen Initial Assessment:   Oxygen Re-Evaluation:   Oxygen Discharge (Final Oxygen Re-Evaluation):   Initial Exercise Prescription:  Initial Exercise Prescription - 04/11/22 0900       Date of Initial Exercise RX and Referring Provider   Date 04/11/22    Referring Provider Dr. Cyndia Bent    Expected Discharge Date 07/06/22      Treadmill   MPH 1    Grade 0    Minutes 17      NuStep   Level 1    SPM 60    Minutes 22      Prescription Details   Frequency (times per week) 3    Duration Progress to 30 minutes of continuous aerobic without signs/symptoms of physical distress      Intensity   THRR 40-80% of Max Heartrate 62-125    Ratings of Perceived Exertion 11-13      Resistance Training   Training Prescription Yes    Weight 3    Reps  10-15             Perform Capillary Blood Glucose checks as needed.  Exercise Prescription Changes:   Exercise Comments:   Exercise Goals and Review:   Exercise Goals     Row Name 04/11/22 0949             Exercise Goals   Increase Physical Activity Yes       Intervention Provide advice, education, support and counseling about physical activity/exercise needs.;Develop an individualized exercise prescription for aerobic and resistive training based on initial evaluation findings, risk stratification, comorbidities and participant's personal goals.       Expected Outcomes Short Term: Attend rehab on a regular basis to increase amount of physical activity.;Long Term: Add in home exercise to make exercise part of routine and to increase amount of physical activity.;Long Term: Exercising regularly at least 3-5 days a week.       Increase Strength and Stamina Yes       Intervention Provide advice, education, support and counseling about physical activity/exercise needs.;Develop an individualized exercise prescription for aerobic and resistive  training based on initial evaluation findings, risk stratification, comorbidities and participant's personal goals.       Expected Outcomes Short Term: Increase workloads from initial exercise prescription for resistance, speed, and METs.;Short Term: Perform resistance training exercises routinely during rehab and add in resistance training at home;Long Term: Improve cardiorespiratory fitness, muscular endurance and strength as measured by increased METs and functional capacity (6MWT)       Able to understand and use rate of perceived exertion (RPE) scale Yes       Intervention Provide education and explanation on how to use RPE scale       Expected Outcomes Short Term: Able to use RPE daily in rehab to express subjective intensity level;Long Term:  Able to use RPE to guide intensity level when exercising independently       Knowledge and understanding of Target Heart Rate Range (THRR) Yes       Intervention Provide education and explanation of THRR including how the numbers were predicted and where they are located for reference       Expected Outcomes Long Term: Able to use THRR to govern intensity when exercising independently;Short Term: Able to state/look up THRR;Short Term: Able to use daily as guideline for intensity in rehab       Able to check pulse independently Yes       Intervention Provide education and demonstration on how to check pulse in carotid and radial arteries.;Review the importance of being able to check your own pulse for safety during independent exercise       Expected Outcomes Short Term: Able to explain why pulse checking is important during independent exercise;Long Term: Able to check pulse independently and accurately       Understanding of Exercise Prescription Yes       Intervention Provide education, explanation, and written materials on patient's individual exercise prescription       Expected Outcomes Short Term: Able to explain program exercise prescription;Long  Term: Able to explain home exercise prescription to exercise independently                Exercise Goals Re-Evaluation :    Discharge Exercise Prescription (Final Exercise Prescription Changes):   Nutrition:  Target Goals: Understanding of nutrition guidelines, daily intake of sodium '1500mg'$ , cholesterol '200mg'$ , calories 30% from fat and 7% or less from saturated fats, daily to have 5 or  more servings of fruits and vegetables.  Biometrics:  Pre Biometrics - 04/11/22 0950       Pre Biometrics   Height '5\' 5"'$  (1.651 m)    Weight 195 lb 8.8 oz (88.7 kg)    Waist Circumference 42 inches    Hip Circumference 43 inches    Waist to Hip Ratio 0.98 %    BMI (Calculated) 32.54    Triceps Skinfold 25 mm    % Body Fat 43 %    Grip Strength 18.8 kg    Flexibility 8 in    Single Leg Stand 3.48 seconds              Nutrition Therapy Plan and Nutrition Goals:  Nutrition Therapy & Goals - 04/11/22 0906       Intervention Plan   Intervention Nutrition handout(s) given to patient.    Expected Outcomes Short Term Goal: Understand basic principles of dietary content, such as calories, fat, sodium, cholesterol and nutrients.             Nutrition Assessments:  Nutrition Assessments - 04/11/22 0906       MEDFICTS Scores   Pre Score 3            MEDIFICTS Score Key: ?70 Need to make dietary changes  40-70 Heart Healthy Diet ? 40 Therapeutic Level Cholesterol Diet   Picture Your Plate Scores: <16 Unhealthy dietary pattern with much room for improvement. 41-50 Dietary pattern unlikely to meet recommendations for good health and room for improvement. 51-60 More healthful dietary pattern, with some room for improvement.  >60 Healthy dietary pattern, although there may be some specific behaviors that could be improved.    Nutrition Goals Re-Evaluation:   Nutrition Goals Discharge (Final Nutrition Goals Re-Evaluation):   Psychosocial: Target Goals: Acknowledge  presence or absence of significant depression and/or stress, maximize coping skills, provide positive support system. Participant is able to verbalize types and ability to use techniques and skills needed for reducing stress and depression.  Initial Review & Psychosocial Screening:  Initial Psych Review & Screening - 04/11/22 0902       Initial Review   Current issues with Current Depression;Current Anxiety/Panic;Current Stress Concerns    Source of Stress Concerns Family    Comments Her significant other, who she has lived with for 20 years, is now 66 and in declining health. 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      Family Dynamics   Good Support System? Yes    Comments Her support system includes her daughters and her friends.      Barriers   Psychosocial barriers to participate in program The patient should benefit from training in stress management and relaxation.      Screening Interventions   Interventions Encouraged to exercise;Provide feedback about the scores to participant    Expected Outcomes Long Term goal: The participant improves quality of Life and PHQ9 Scores as seen by post scores and/or verbalization of changes;Short Term goal: Identification and review with participant of any Quality of Life or Depression concerns found by scoring the questionnaire.;Long Term Goal: Stressors or current issues are controlled or eliminated.             Quality of Life Scores:  Quality of Life - 04/11/22 0950       Quality of Life   Select Quality of Life      Quality of Life  Scores   Health/Function Pre 27.2 %    Socioeconomic Pre 30 %    Psych/Spiritual Pre 27.43 %    Family Pre 28.3 %    GLOBAL Pre 28.04 %            Scores of 19 and below usually indicate a poorer quality of life in these areas.  A difference of  2-3 points is a clinically meaningful difference.  A difference of 2-3  points in the total score of the Quality of Life Index has been associated with significant improvement in overall quality of life, self-image, physical symptoms, and general health in studies assessing change in quality of life.  PHQ-9: Review Flowsheet  More data exists      04/11/2022 09/27/2020 08/09/2020 04/20/2020 08/15/2019  Depression screen PHQ 2/9  Decreased Interest 1 0 0 0 1  Down, Depressed, Hopeless 0 0 0 0 1  PHQ - 2 Score 1 0 0 0 2  Altered sleeping 0 - 0 0 1  Tired, decreased energy 1 - 0 2 1  Change in appetite 0 - 0 0 0  Feeling bad or failure about yourself  0 - 0 0 0  Trouble concentrating 0 - 1 1 0  Moving slowly or fidgety/restless 0 - 0 0 0  Suicidal thoughts 0 - 0 0 0  PHQ-9 Score 2 - '1 3 4  '$ Difficult doing work/chores Not difficult at all - Not difficult at all Not difficult at all Not difficult at all   Interpretation of Total Score  Total Score Depression Severity:  1-4 = Minimal depression, 5-9 = Mild depression, 10-14 = Moderate depression, 15-19 = Moderately severe depression, 20-27 = Severe depression   Psychosocial Evaluation and Intervention:  Psychosocial Evaluation - 04/11/22 0921       Psychosocial Evaluation & Interventions   Interventions Stress management education;Relaxation education;Encouraged to exercise with the program and follow exercise prescription    Comments Pt has no barriers to participating in CR. Pt does have anxiety and depression which is treated with citalopram 40 mg daily. She reports that this treatment helps. Pt also has current stress concerns. Her significant other, Ebony Hail, is 65 years old and is in declining health. She has lived with him for the past 20 years. 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 She is hopeful that as she gets her energy back, she will be less stressed about this. She scored a 2 on her PHQ-9 and  relates this to her lack of energy and lack of interest due to her lack of energy. She reports that she has a good support system from her two daughters and from her group of friends. Her goals while in the program are to improve her energy levels and to lose weight. She is eager to start the program.    Expected Outcomes Pt's depression and anxiety will continue to be medically treated, she will learn to positively cope with her stressors through provided education, and she will have no other identifiable psychosocial issues.    Continue Psychosocial Services  No Follow up required             Psychosocial Re-Evaluation:   Psychosocial Discharge (Final Psychosocial Re-Evaluation):   Vocational Rehabilitation: Provide vocational rehab assistance to qualifying candidates.   Vocational Rehab Evaluation & Intervention:   Education: Education Goals: Education classes will be provided on a weekly  basis, covering required topics. Participant will state understanding/return demonstration of topics presented.  Learning Barriers/Preferences:  Learning Barriers/Preferences - 04/11/22 0907       Learning Barriers/Preferences   Learning Barriers Sight;Hearing    Learning Preferences Skilled Demonstration             Education Topics: Hypertension, Hypertension Reduction -Define heart disease and high blood pressure. Discus how high blood pressure affects the body and ways to reduce high blood pressure.   Exercise and Your Heart -Discuss why it is important to exercise, the FITT principles of exercise, normal and abnormal responses to exercise, and how to exercise safely.   Angina -Discuss definition of angina, causes of angina, treatment of angina, and how to decrease risk of having angina.   Cardiac Medications -Review what the following cardiac medications are used for, how they affect the body, and side effects that may occur when taking the medications.  Medications  include Aspirin, Beta blockers, calcium channel blockers, ACE Inhibitors, angiotensin receptor blockers, diuretics, digoxin, and antihyperlipidemics.   Congestive Heart Failure -Discuss the definition of CHF, how to live with CHF, the signs and symptoms of CHF, and how keep track of weight and sodium intake.   Heart Disease and Intimacy -Discus the effect sexual activity has on the heart, how changes occur during intimacy as we age, and safety during sexual activity.   Smoking Cessation / COPD -Discuss different methods to quit smoking, the health benefits of quitting smoking, and the definition of COPD.   Nutrition I: Fats -Discuss the types of cholesterol, what cholesterol does to the heart, and how cholesterol levels can be controlled.   Nutrition II: Labels -Discuss the different components of food labels and how to read food label   Heart Parts/Heart Disease and PAD -Discuss the anatomy of the heart, the pathway of blood circulation through the heart, and these are affected by heart disease.   Stress I: Signs and Symptoms -Discuss the causes of stress, how stress may lead to anxiety and depression, and ways to limit stress.   Stress II: Relaxation -Discuss different types of relaxation techniques to limit stress.   Warning Signs of Stroke / TIA -Discuss definition of a stroke, what the signs and symptoms are of a stroke, and how to identify when someone is having stroke.   Knowledge Questionnaire Score:  Knowledge Questionnaire Score - 04/11/22 0908       Knowledge Questionnaire Score   Pre Score 15/24             Core Components/Risk Factors/Patient Goals at Admission:  Personal Goals and Risk Factors at Admission - 04/11/22 0914       Core Components/Risk Factors/Patient Goals on Admission    Weight Management Yes;Obesity;Weight Loss    Intervention Weight Management: Develop a combined nutrition and exercise program designed to reach desired caloric  intake, while maintaining appropriate intake of nutrient and fiber, sodium and fats, and appropriate energy expenditure required for the weight goal.;Weight Management: Provide education and appropriate resources to help participant work on and attain dietary goals.;Weight Management/Obesity: Establish reasonable short term and long term weight goals.;Obesity: Provide education and appropriate resources to help participant work on and attain dietary goals.    Expected Outcomes Short Term: Continue to assess and modify interventions until short term weight is achieved;Long Term: Adherence to nutrition and physical activity/exercise program aimed toward attainment of established weight goal;Weight Maintenance: Understanding of the daily nutrition guidelines, which includes 25-35% calories from fat, 7% or less  cal from saturated fats, less than '200mg'$  cholesterol, less than 1.5gm of sodium, & 5 or more servings of fruits and vegetables daily;Weight Loss: Understanding of general recommendations for a balanced deficit meal plan, which promotes 1-2 lb weight loss per week and includes a negative energy balance of [phone] kcal/d;Understanding recommendations for meals to include 15-35% energy as protein, 25-35% energy from fat, 35-60% energy from carbohydrates, less than '200mg'$  of dietary cholesterol, 20-35 gm of total fiber daily;Understanding of distribution of calorie intake throughout the day with the consumption of 4-5 meals/snacks    Diabetes Yes    Intervention Provide education about signs/symptoms and action to take for hypo/hyperglycemia.;Provide education about proper nutrition, including hydration, and aerobic/resistive exercise prescription along with prescribed medications to achieve blood glucose in normal ranges: Fasting glucose 65-99 mg/dL    Expected Outcomes Short Term: Participant verbalizes understanding of the signs/symptoms and immediate care of hyper/hypoglycemia, proper foot care and importance  of medication, aerobic/resistive exercise and nutrition plan for blood glucose control.;Long Term: Attainment of HbA1C < 7%.    Hypertension Yes    Intervention Provide education on lifestyle modifcations including regular physical activity/exercise, weight management, moderate sodium restriction and increased consumption of fresh fruit, vegetables, and low fat dairy, alcohol moderation, and smoking cessation.;Monitor prescription use compliance.    Expected Outcomes Short Term: Continued assessment and intervention until BP is < 140/39m HG in hypertensive participants. < 130/865mHG in hypertensive participants with diabetes, heart failure or chronic kidney disease.;Long Term: Maintenance of blood pressure at goal levels.    Lipids Yes    Intervention Provide education and support for participant on nutrition & aerobic/resistive exercise along with prescribed medications to achieve LDL '70mg'$ , HDL >'40mg'$ .    Expected Outcomes Short Term: Participant states understanding of desired cholesterol values and is compliant with medications prescribed. Participant is following exercise prescription and nutrition guidelines.;Long Term: Cholesterol controlled with medications as prescribed, with individualized exercise RX and with personalized nutrition plan. Value goals: LDL < '70mg'$ , HDL > 40 mg.    Personal Goal Other Yes    Personal Goal Improve energy levels.    Intervention Attend CR three days per week and begin a home exercise program.    Expected Outcomes Pt will meet stated goal.             Core Components/Risk Factors/Patient Goals Review:    Core Components/Risk Factors/Patient Goals at Discharge (Final Review):    ITP Comments:   Comments: Patient arrived for 1st visit/orientation/education at 0800. Patient was referred to CR by Dr. BaCyndia Bentue to S/P AVR (Z95.2). During orientation advised patient on arrival and appointment times what to wear, what to do before, during and after  exercise. Reviewed attendance and class policy.  Pt is scheduled to return Cardiac Rehab on 04/17/2022 at 0930. Pt was advised to come to class 15 minutes before class starts.  Discussed RPE/Dpysnea scales. Patient participated in warm up stretches. Patient was able to complete 6 minute walk test.  Telemetry:NSR. Patient was measured for the equipment. Discussed equipment safety with patient. Took patient pre-anthropometric measurements. Patient finished visit at [place]

## 2022-04-17 ENCOUNTER — Encounter (HOSPITAL_COMMUNITY)
Admission: RE | Admit: 2022-04-17 | Discharge: 2022-04-17 | Disposition: A | Discharge status: Home / Self Care | Payer: HMO | Source: Ambulatory Visit | Attending: Cardiology | Admitting: Cardiology

## 2022-04-17 DIAGNOSIS — Z952 Presence of prosthetic heart valve: Secondary | ICD-10-CM | POA: Diagnosis not present

## 2022-04-18 ENCOUNTER — Other Ambulatory Visit: Payer: Self-pay | Admitting: Nurse Practitioner

## 2022-04-19 ENCOUNTER — Encounter (HOSPITAL_COMMUNITY)
Admission: RE | Admit: 2022-04-19 | Discharge: 2022-04-19 | Disposition: A | Discharge status: Home / Self Care | Payer: HMO | Source: Ambulatory Visit | Attending: Cardiology | Admitting: Cardiology

## 2022-04-19 DIAGNOSIS — Z952 Presence of prosthetic heart valve: Secondary | ICD-10-CM | POA: Diagnosis not present

## 2022-04-19 NOTE — Progress Notes (Signed)
Daily Session Note  Patient Details  Name: Sarah Pope MRN: [id number] Date of Birth: 18-Feb-1956 Referring Provider:   Flowsheet Row CARDIAC REHAB PHASE II ORIENTATION from 04/11/2022 in [place]  Referring Provider Dr. Cyndia Bent       Encounter Date: 04/19/2022  Check In:  Session Check In - 04/19/22 0927       Check-In   Supervising physician immediately available to respond to emergencies [facility] MD immediately available    Physician(s) Dr Harl Bowie    Location [facility]    Staff Present Marializ Ferrebee Hassell Done, RN, BSN;Heather Otho Ket, BS, Exercise Physiologist;Dalton Kris Mouton, MS, ACSM-CEP, Exercise Physiologist    Virtual Visit No    Medication changes reported     No    Fall or balance concerns reported    Yes    Comments She has not fallen, but reports that she frequently loses her balance but catches herself. She also frequently gets dizzy and lightheaded due to her medications.    Tobacco Cessation No Change    Warm-up and Cool-down Performed as group-led instruction    Resistance Training Performed Yes    VAD Patient? No    PAD/SET Patient? No      Pain Assessment   Currently in Pain? No/denies    Multiple Pain Sites No             Capillary Blood Glucose: No results found for this or any previous visit (from the past 24 hour(s)).    Social History   Tobacco Use  Smoking Status Former   Packs/day: 0.50   Years: 5.00   Total pack years: 2.50   Types: Cigarettes   Quit date: 10/23/1982   Years since quitting: 39.5  Smokeless Tobacco Never    Goals Met:  Independence with exercise equipment Exercise tolerated well No report of concerns or symptoms today Strength training completed today  Goals Unmet:  Not Applicable  Comments: checkout at 1030.   Dr. Carlyle Dolly is Medical Director for [facility] Cardiac Rehab

## 2022-04-21 ENCOUNTER — Encounter (HOSPITAL_COMMUNITY): Payer: HMO

## 2022-04-24 ENCOUNTER — Encounter (HOSPITAL_COMMUNITY)
Admission: RE | Admit: 2022-04-24 | Discharge: 2022-04-24 | Disposition: A | Discharge status: Home / Self Care | Payer: HMO | Source: Ambulatory Visit | Attending: Cardiology | Admitting: Cardiology

## 2022-04-24 VITALS — Wt 194.9 lb

## 2022-04-24 DIAGNOSIS — Z952 Presence of prosthetic heart valve: Secondary | ICD-10-CM | POA: Diagnosis not present

## 2022-04-24 NOTE — Progress Notes (Signed)
Daily Session Note  Patient Details  Name: Sarah Pope MRN: [id number] Date of Birth: January 18, 1956 Referring Provider:   Flowsheet Row CARDIAC REHAB PHASE II ORIENTATION from 04/11/2022 in [place]  Referring Provider Dr. Cyndia Bent       Encounter Date: 04/24/2022  Check In:  Session Check In - 04/24/22 0930       Check-In   Supervising physician immediately available to respond to emergencies [facility] MD immediately available    Physician(s) Dr. Johnsie Cancel    Location [facility]    Staff Present Redge Gainer, BS, Exercise Physiologist;Dalton Kris Mouton, MS, ACSM-CEP, Exercise Physiologist    Virtual Visit No    Medication changes reported     No    Fall or balance concerns reported    No    Tobacco Cessation No Change    Warm-up and Cool-down Performed as group-led instruction    Resistance Training Performed Yes    VAD Patient? No    PAD/SET Patient? No      Pain Assessment   Currently in Pain? No/denies    Multiple Pain Sites No             Capillary Blood Glucose: No results found for this or any previous visit (from the past 24 hour(s)).    Social History   Tobacco Use  Smoking Status Former   Packs/day: 0.50   Years: 5.00   Total pack years: 2.50   Types: Cigarettes   Quit date: 10/23/1982   Years since quitting: 39.5  Smokeless Tobacco Never    Goals Met:  Independence with exercise equipment Exercise tolerated well No report of concerns or symptoms today Strength training completed today  Goals Unmet:  Not Applicable  Comments: check out 1030   Dr. Carlyle Dolly is Medical Director for [facility]

## 2022-04-26 ENCOUNTER — Encounter (HOSPITAL_COMMUNITY)
Admission: RE | Admit: 2022-04-26 | Discharge: 2022-04-26 | Disposition: A | Discharge status: Home / Self Care | Payer: HMO | Source: Ambulatory Visit | Attending: Cardiology | Admitting: Cardiology

## 2022-04-26 DIAGNOSIS — Z952 Presence of prosthetic heart valve: Secondary | ICD-10-CM

## 2022-04-26 NOTE — Progress Notes (Signed)
Daily Session Note  Patient Details  Name: Sarah Pope MRN: [id number] Date of Birth: May 06, 1956 Referring Provider:   Flowsheet Row CARDIAC REHAB PHASE II ORIENTATION from 04/11/2022 in [place]  Referring Provider Dr. Cyndia Bent       Encounter Date: 04/26/2022  Check In:  Session Check In - 04/26/22 0930       Check-In   Supervising physician immediately available to respond to emergencies [facility] MD immediately available    Physician(s) Dr. Johnsie Cancel    Location [facility]    Staff Present Redge Gainer, BS, Exercise Physiologist;Ayden Hardwick Wynetta Emery, RN, Bjorn Loser, MS, ACSM-CEP, Exercise Physiologist    Virtual Visit No    Medication changes reported     No    Fall or balance concerns reported    Yes    Comments She has not fallen, but reports that she frequently loses her balance but catches herself. She also frequently gets dizzy and lightheaded due to her medications.    Tobacco Cessation No Change    Resistance Training Performed No    VAD Patient? No    PAD/SET Patient? No      Pain Assessment   Currently in Pain? No/denies    Multiple Pain Sites No             Capillary Blood Glucose: No results found for this or any previous visit (from the past 24 hour(s)).    Social History   Tobacco Use  Smoking Status Former   Packs/day: 0.50   Years: 5.00   Total pack years: 2.50   Types: Cigarettes   Quit date: 10/23/1982   Years since quitting: 39.5  Smokeless Tobacco Never    Goals Met:  Independence with exercise equipment Exercise tolerated well No report of concerns or symptoms today Strength training completed today  Goals Unmet:  Not Applicable  Comments: Check out 1030.   Dr. Carlyle Dolly is Medical Director for [facility] Cardiac Rehab

## 2022-04-28 ENCOUNTER — Encounter (HOSPITAL_COMMUNITY): Payer: HMO

## 2022-05-01 ENCOUNTER — Encounter (HOSPITAL_COMMUNITY)
Admission: RE | Admit: 2022-05-01 | Discharge: 2022-05-01 | Disposition: A | Discharge status: Home / Self Care | Payer: HMO | Source: Ambulatory Visit | Attending: Cardiology | Admitting: Cardiology

## 2022-05-01 DIAGNOSIS — Z952 Presence of prosthetic heart valve: Secondary | ICD-10-CM | POA: Diagnosis not present

## 2022-05-01 NOTE — Progress Notes (Signed)
Daily Session Note  Patient Details  Name: Sarah Pope MRN: [id number] Date of Birth: 06-22-1956 Referring Provider:   Flowsheet Row CARDIAC REHAB PHASE II ORIENTATION from 04/11/2022 in [place]  Referring Provider Dr. Cyndia Bent       Encounter Date: 05/01/2022  Check In:  Session Check In - 05/01/22 0930       Check-In   Supervising physician immediately available to respond to emergencies [facility] MD immediately available    Physician(s) Dr. Harrington Challenger    Location [facility]    Staff Present Hoy Register, MS, ACSM-CEP, Exercise Physiologist;Heather Zigmund Daniel, Exercise Physiologist    Virtual Visit No    Medication changes reported     No    Fall or balance concerns reported    Yes    Comments She has not fallen, but reports that she frequently loses her balance but catches herself. She also frequently gets dizzy and lightheaded due to her medications.    Tobacco Cessation No Change    Warm-up and Cool-down Performed as group-led instruction    Resistance Training Performed Yes    VAD Patient? No    PAD/SET Patient? No      Pain Assessment   Currently in Pain? No/denies    Multiple Pain Sites No             Capillary Blood Glucose: No results found for this or any previous visit (from the past 24 hour(s)).    Social History   Tobacco Use  Smoking Status Former   Packs/day: 0.50   Years: 5.00   Total pack years: 2.50   Types: Cigarettes   Quit date: 10/23/1982   Years since quitting: 39.5  Smokeless Tobacco Never    Goals Met:  Independence with exercise equipment Exercise tolerated well No report of concerns or symptoms today Strength training completed today  Goals Unmet:  Not Applicable  Comments: checkout time is 1030   Dr. Carlyle Dolly is Medical Director for [place]

## 2022-05-03 ENCOUNTER — Encounter (HOSPITAL_COMMUNITY)
Admission: RE | Admit: 2022-05-03 | Discharge: 2022-05-03 | Disposition: A | Discharge status: Home / Self Care | Payer: HMO | Source: Ambulatory Visit | Attending: Cardiology | Admitting: Cardiology

## 2022-05-03 DIAGNOSIS — Z952 Presence of prosthetic heart valve: Secondary | ICD-10-CM

## 2022-05-03 NOTE — Progress Notes (Signed)
Daily Session Note  Patient Details  Name: Sarah Pope MRN: [id number] Date of Birth: 01/28/1956 Referring Provider:   Flowsheet Row CARDIAC REHAB PHASE II ORIENTATION from 04/11/2022 in [place]  Referring Provider Dr. Cyndia Bent       Encounter Date: 05/03/2022  Check In:  Session Check In - 05/03/22 0930       Check-In   Supervising physician immediately available to respond to emergencies [facility] MD immediately available    Physician(s) Dr Audie Box    Location [facility]    Staff Present Hoy Register, MS, ACSM-CEP, Exercise Physiologist;Heather Otho Ket, BS, Exercise Physiologist;Arhianna Ebey Hassell Done, RN, BSN    Virtual Visit No    Medication changes reported     No    Fall or balance concerns reported    Yes    Comments She has not fallen, but reports that she frequently loses her balance but catches herself. She also frequently gets dizzy and lightheaded due to her medications.    Tobacco Cessation No Change    Warm-up and Cool-down Performed as group-led instruction    Resistance Training Performed Yes    VAD Patient? No    PAD/SET Patient? No      Pain Assessment   Currently in Pain? No/denies    Multiple Pain Sites No             Capillary Blood Glucose: No results found for this or any previous visit (from the past 24 hour(s)).    Social History   Tobacco Use  Smoking Status Former   Packs/day: 0.50   Years: 5.00   Total pack years: 2.50   Types: Cigarettes   Quit date: 10/23/1982   Years since quitting: 39.5  Smokeless Tobacco Never    Goals Met:  Independence with exercise equipment Exercise tolerated well No report of concerns or symptoms today Strength training completed today  Goals Unmet:  Not Applicable  Comments: checkout at 0930.   Dr. Carlyle Dolly is Medical Director for [facility] Cardiac Rehab

## 2022-05-05 ENCOUNTER — Encounter (HOSPITAL_COMMUNITY): Payer: HMO

## 2022-05-08 ENCOUNTER — Encounter (HOSPITAL_COMMUNITY)
Admission: RE | Admit: 2022-05-08 | Discharge: 2022-05-08 | Disposition: A | Discharge status: Home / Self Care | Payer: HMO | Source: Ambulatory Visit | Attending: Cardiology | Admitting: Cardiology

## 2022-05-08 VITALS — Wt 194.4 lb

## 2022-05-08 DIAGNOSIS — Z952 Presence of prosthetic heart valve: Secondary | ICD-10-CM

## 2022-05-08 NOTE — Progress Notes (Signed)
Daily Session Note  Patient Details  Name: Sarah Pope MRN: [id number] Date of Birth: [birth year] Referring Provider:   Flowsheet Row CARDIAC REHAB PHASE II ORIENTATION from 04/11/2022 in [place]  Referring Provider Dr. Cyndia Bent       Encounter Date: 05/08/2022  Check In:  Session Check In - 05/08/22 0930       Check-In   Supervising physician immediately available to respond to emergencies [facility] MD immediately available    Physician(s) Dr. Phineas Inches    Location [facility]    Staff Present Aundra Dubin, RN, Bjorn Loser, MS, ACSM-CEP, Exercise Physiologist;Heather Zigmund Daniel, Exercise Physiologist    Virtual Visit No    Medication changes reported     No    Fall or balance concerns reported    Yes    Comments She has not fallen, but reports that she frequently loses her balance but catches herself. She also frequently gets dizzy and lightheaded due to her medications.    Tobacco Cessation No Change    Warm-up and Cool-down Performed as group-led instruction    Resistance Training Performed Yes    VAD Patient? No    PAD/SET Patient? No      Pain Assessment   Currently in Pain? No/denies    Multiple Pain Sites No             Capillary Blood Glucose: No results found for this or any previous visit (from the past 24 hour(s)).    Social History   Tobacco Use  Smoking Status Former   Packs/day: 0.50   Years: 5.00   Total pack years: 2.50   Types: Cigarettes   Quit date: 10/23/1982   Years since quitting: 39.5  Smokeless Tobacco Never    Goals Met:  Independence with exercise equipment Exercise tolerated well No report of concerns or symptoms today Strength training completed today  Goals Unmet:  Not Applicable  Comments: Check out 1030   Dr. Carlyle Dolly is Medical Director for [place]

## 2022-05-10 ENCOUNTER — Encounter (HOSPITAL_COMMUNITY)
Admission: RE | Admit: 2022-05-10 | Discharge: 2022-05-10 | Disposition: A | Discharge status: Home / Self Care | Payer: HMO | Source: Ambulatory Visit | Attending: Cardiology | Admitting: Cardiology

## 2022-05-10 DIAGNOSIS — Z952 Presence of prosthetic heart valve: Secondary | ICD-10-CM

## 2022-05-10 NOTE — Progress Notes (Signed)
Daily Session Note  Patient Details  Name: Sarah Pope MRN: [id number] Date of Birth: 11-23-1955 Referring Provider:   Flowsheet Row CARDIAC REHAB PHASE II ORIENTATION from 04/11/2022 in [place]  Referring Provider Dr. Cyndia Bent       Encounter Date: 05/10/2022  Check In:  Session Check In - 05/10/22 0930       Check-In   Supervising physician immediately available to respond to emergencies [facility] MD immediately available    Physician(s) Dr Zandra Abts    Location [facility]    Staff Present Aundra Dubin, RN, Bjorn Loser, MS, ACSM-CEP, Exercise Physiologist;Heather Zigmund Daniel, Exercise Physiologist;Marie Chow Hassell Done, RN, BSN    Virtual Visit No    Medication changes reported     No    Fall or balance concerns reported    Yes    Comments She has not fallen, but reports that she frequently loses her balance but catches herself. She also frequently gets dizzy and lightheaded due to her medications.    Tobacco Cessation No Change    Warm-up and Cool-down Performed as group-led instruction    Resistance Training Performed Yes    VAD Patient? No    PAD/SET Patient? No      Pain Assessment   Currently in Pain? No/denies    Multiple Pain Sites No             Capillary Blood Glucose: No results found for this or any previous visit (from the past 24 hour(s)).    Social History   Tobacco Use  Smoking Status Former   Packs/day: 0.50   Years: 5.00   Total pack years: 2.50   Types: Cigarettes   Quit date: 10/23/1982   Years since quitting: 39.5  Smokeless Tobacco Never    Goals Met:  Independence with exercise equipment Exercise tolerated well No report of concerns or symptoms today Strength training completed today  Goals Unmet:  Not Applicable  Comments: Checkout at 1030.   Dr. Carlyle Dolly is Medical Director for [facility] Cardiac Rehab

## 2022-05-10 NOTE — Progress Notes (Signed)
Cardiac Individual Treatment Plan  Patient Details  Name: Sarah Pope MRN: [id number] Date of Birth: 30-Jul-1956 Referring Provider:   Flowsheet Row CARDIAC REHAB PHASE II ORIENTATION from 04/11/2022 in [place]  Referring Provider Dr. Cyndia Bent       Initial Encounter Date:  Flowsheet Row CARDIAC REHAB PHASE II ORIENTATION from 04/11/2022 in [place]  Date 04/11/22       Visit Diagnosis: S/P AVR  Patient's Home Medications on Admission:  Current Outpatient Medications:    acetaminophen (TYLENOL) 325 MG tablet, Take 2 tablets (650 mg total) by mouth every 6 (six) hours as needed for mild pain., Disp: , Rfl:    albuterol (VENTOLIN HFA) 108 (90 Base) MCG/ACT inhaler, Inhale 2 puffs into the lungs every 6 (six) hours as needed for shortness of breath., Disp: , Rfl:    amoxicillin (AMOXIL) 500 MG capsule, Take 500 mg by mouth 3 (three) times daily., Disp: , Rfl:    aspirin EC 81 MG tablet, Take 81 mg by mouth daily. Swallow whole., Disp: , Rfl:    atorvastatin (LIPITOR) 20 MG tablet, Take 1 tablet (20 mg total) by mouth daily., Disp: 90 tablet, Rfl: 1   azelastine (ASTELIN) 0.1 % nasal spray, Place 1 spray into both nostrils 2 (two) times daily as needed for rhinitis or allergies. Use in each nostril as directed, Disp: , Rfl:    cetirizine (ZYRTEC) 10 MG tablet, Take 10 mg by mouth daily as needed for allergies., Disp: , Rfl:    ciclopirox (PENLAC) 8 % solution, Apply topically at bedtime. Apply over nail and surrounding skin. Apply daily over previous coat. After seven (7) days, may remove with alcohol and continue cycle. (Patient taking differently: Apply 1 Application topically 3 (three) times a week. Apply over nail and surrounding skin. Apply daily over previous coat. After seven (7) days, may remove with alcohol and continue cycle.), Disp: 6.6 mL, Rfl: 2   citalopram (CELEXA) 40 MG tablet, Take 1 tablet (40 mg total) by mouth daily.,  Disp: 90 tablet, Rfl: 1   Continuous Blood Gluc Receiver (FREESTYLE LIBRE 14 DAY READER) DEVI, USE AS DIRECTED THREE TIMES DAILY., Disp: 1 each, Rfl: PRN   Continuous Blood Gluc Sensor (FREESTYLE LIBRE 14 DAY SENSOR) MISC, USE TO CHECK BLOOD SUGAR THREE TIMES DAILY WITH SLIDING SCALE INSULIN USE., Disp: 2 each, Rfl: 2   diclofenac (VOLTAREN) 75 MG EC tablet, TAKE ONE TABLET BY MOUTH TWICE daily PRN pain. TAKE WITH FOOD. (Patient taking differently: Take 75 mg by mouth See admin instructions. Take 1 tablet (75 mg) by mouth (scheduled) every morning, may take an additional dose if needed for pain.), Disp: 28 tablet, Rfl: 5   Fluticasone-Salmeterol (ADVAIR DISKUS) 250-50 MCG/DOSE AEPB, Inhale 1 puff into the lungs 2 (two) times daily. (Patient taking differently: Inhale 1 puff into the lungs 2 (two) times daily as needed (respiratory issues.).), Disp: 1 each, Rfl: 3   insulin glargine (LANTUS) 100 UNIT/ML Solostar Pen, Inject 80 Units into the skin at bedtime., Disp: 45 mL, Rfl: 3   insulin lispro (HUMALOG) 100 UNIT/ML injection, Inject 0.12-0.18 mLs (12-18 Units total) into the skin 3 (three) times daily with meals. Sliding scale if needed, Disp: , Rfl:    levothyroxine (SYNTHROID) 175 MCG tablet, Take 1 tablet (175 mcg total) by mouth daily., Disp: 90 tablet, Rfl: 1   losartan (COZAAR) 25 MG tablet, Take 1 tablet (25 mg total) by mouth daily., Disp: 90 tablet, Rfl: 3  metFORMIN (GLUCOPHAGE) 1000 MG tablet, Take 1 tablet (1,000 mg total) by mouth 2 (two) times daily with a meal. (Patient taking differently: Take 1,000 mg by mouth as needed.), Disp: 180 tablet, Rfl: 1   metoprolol tartrate (LOPRESSOR) 25 MG tablet, Take 1 tablet (25 mg total) by mouth 2 (two) times daily., Disp: 60 tablet, Rfl: 3   omeprazole (PRILOSEC) 40 MG capsule, Take 1 capsule (40 mg total) by mouth daily., Disp: 90 capsule, Rfl: 1   ondansetron (ZOFRAN) 4 MG tablet, Take 1 tablet (4 mg total) by mouth every 8 (eight) hours as needed  for nausea or vomiting., Disp: 20 tablet, Rfl: 0   potassium chloride SA (KLOR-CON M) 20 MEQ tablet, Take 1 tablet (20 mEq total) by mouth daily., Disp: 7 tablet, Rfl: 0   Semaglutide,0.25 or 0.'5MG'$ /DOS, (OZEMPIC, 0.25 OR 0.5 MG/DOSE,) 2 MG/1.5ML SOPN, Inject 0.5 mg into the skin once a week. (Patient taking differently: Inject 0.5 mg into the skin every Thursday.), Disp: 3 mL, Rfl: 3   traMADol (ULTRAM) 50 MG tablet, Take 1 tablet (50 mg total) by mouth every 6 (six) hours as needed., Disp: 30 tablet, Rfl: 0  Past Medical History: Past Medical History:  Diagnosis Date   Acute respiratory failure with hypercapnia (HCC)    Allergy    Anxiety    Aortic stenosis    a. S/p AVR on 02/06/2022 using a 21 mm Edwards Inspiris Resilina pericardial valve   Asthma    Bronchitis with bronchospasm    Cancer ([place])    skin   Depression    DM type 2 (diabetes mellitus, type 2) ([place])    FATTY LIVER DISEASE 08/05/2008   GERD (gastroesophageal reflux disease)    Hyperlipidemia    Hypertension    Hypothyroidism    Kidney stones    Leukocytosis    Obesity, Class III, BMI 40-49.9 (morbid obesity) ([place])    PERSONAL HX COLONIC POLYPS 09/01/2008   PONV (postoperative nausea and vomiting)    Seasonal allergies    TRANSAMINASES, SERUM, ELEVATED 08/05/2008    Tobacco Use: Social History   Tobacco Use  Smoking Status Former   Packs/day: 0.50   Years: 5.00   Total pack years: 2.50   Types: Cigarettes   Quit date: 10/23/1982   Years since quitting: 39.5  Smokeless Tobacco Never    Labs: Review Flowsheet  More data exists      Latest Ref Rng & Units 08/01/2021 10/25/2021 12/01/2021 02/02/2022 02/06/2022  Labs for ITP Cardiac and Pulmonary Rehab  Hemoglobin A1c 4.8 - 5.6 % 10.2  9.0  - 8.6  -  PH, Arterial 7.35 - 7.45 - - 7.342  7.41  7.308  7.286  7.321  7.417  7.431  7.479  7.486  7.383   PCO2 arterial 32 - 48 mmHg - - 48.7  39  50.2  50.5  46.3  42.7  43.0  37.1  36.7  46.0   Bicarbonate 20.0 - 28.0  mmol/L - - 26.4  28.3  24.7  25.2  24.1  24.2  27.5  28.6  26.8  27.6  27.7  27.4   TCO2 22 - 32 mmol/L - - 28  30  - '27  26  26  27  29  30  29  28  28  29  29  26  28  29   '$ Acid-base deficit 0.0 - 2.0 mmol/L - - - - 1.0  3.0  2.0   O2 Saturation % - -  99.0  76.0  98.6  95  97  93  100  100  83  100  100  100     Capillary Blood Glucose: Lab Results  Component Value Date   GLUCAP 100 (H) 02/11/2022   GLUCAP 226 (H) 02/10/2022   GLUCAP 278 (H) 02/10/2022   GLUCAP 145 (H) 02/10/2022   GLUCAP 95 02/10/2022    POCT Glucose     Row Name 04/11/22 0842             POCT Blood Glucose   Pre-Exercise 151 mg/dL                Exercise Target Goals: Exercise Program Goal: Individual exercise prescription set using results from initial 6 min walk test and THRR while considering  patient's activity barriers and safety.   Exercise Prescription Goal: Starting with aerobic activity 30 plus minutes a day, 3 days per week for initial exercise prescription. Provide home exercise prescription and guidelines that participant acknowledges understanding prior to discharge.  Activity Barriers & Risk Stratification:  Activity Barriers & Cardiac Risk Stratification - 04/11/22 0901       Activity Barriers & Cardiac Risk Stratification   Activity Barriers Joint Problems;Balance Concerns    Cardiac Risk Stratification High             6 Minute Walk:  6 Minute Walk     Row Name 04/11/22 0947         6 Minute Walk   Phase Initial     Distance 1200 feet     Walk Time 6 minutes     # of Rest Breaks 0     MPH 2.27     METS 2.5     RPE 13     VO2 Peak 8.79     Symptoms No     Resting HR 72 bpm     Resting BP 94/64     Resting Oxygen Saturation  97 %     Exercise Oxygen Saturation  during 6 min walk 100 %     Max Ex. HR 85 bpm     Max Ex. BP 118/60     2 Minute Post BP 100/60              Oxygen Initial Assessment:   Oxygen Re-Evaluation:   Oxygen Discharge  (Final Oxygen Re-Evaluation):   Initial Exercise Prescription:  Initial Exercise Prescription - 04/11/22 0900       Date of Initial Exercise RX and Referring Provider   Date 04/11/22    Referring Provider Dr. Cyndia Bent    Expected Discharge Date 07/06/22      Treadmill   MPH 1    Grade 0    Minutes 17      NuStep   Level 1    SPM 60    Minutes 22      Prescription Details   Frequency (times per week) 3    Duration Progress to 30 minutes of continuous aerobic without signs/symptoms of physical distress      Intensity   THRR 40-80% of Max Heartrate 62-125    Ratings of Perceived Exertion 11-13      Resistance Training   Training Prescription Yes    Weight 3    Reps 10-15             Perform Capillary Blood Glucose checks as needed.  Exercise Prescription Changes:   Exercise Prescription Changes     Row Name  04/24/22 1000 05/08/22 1000           Response to Exercise   Blood Pressure (Admit) 102/60 100/56      Blood Pressure (Exercise) 110/60 110/56      Blood Pressure (Exit) 98/60 90/50      Heart Rate (Admit) 75 bpm 77 bpm      Heart Rate (Exercise) 93 bpm 104 bpm      Heart Rate (Exit) 84 bpm 86 bpm      Rating of Perceived Exertion (Exercise) 12 11      Duration Continue with 30 min of aerobic exercise without signs/symptoms of physical distress. Continue with 30 min of aerobic exercise without signs/symptoms of physical distress.      Intensity THRR unchanged THRR unchanged        Progression   Progression Continue to progress workloads to maintain intensity without signs/symptoms of physical distress. Continue to progress workloads to maintain intensity without signs/symptoms of physical distress.        Resistance Training   Training Prescription Yes Yes      Weight 5 5      Reps 10-15 10-15      Time 10 Minutes 10 Minutes        Treadmill   MPH 1 1      Grade 0 0      Minutes 17 17      METs 1.77 1.77        NuStep   Level 2 2      SPM  34 93      Minutes 22 22      METs 2.47 2.44               Exercise Comments:   Exercise Comments     Row Name 04/17/22 1249           Exercise Comments Pt completed her first exercise session with no complaints.                Exercise Goals and Review:   Exercise Goals     Row Name 04/11/22 0949 05/08/22 1030           Exercise Goals   Increase Physical Activity Yes Yes      Intervention Provide advice, education, support and counseling about physical activity/exercise needs.;Develop an individualized exercise prescription for aerobic and resistive training based on initial evaluation findings, risk stratification, comorbidities and participant's personal goals. Provide advice, education, support and counseling about physical activity/exercise needs.;Develop an individualized exercise prescription for aerobic and resistive training based on initial evaluation findings, risk stratification, comorbidities and participant's personal goals.      Expected Outcomes Short Term: Attend rehab on a regular basis to increase amount of physical activity.;Long Term: Add in home exercise to make exercise part of routine and to increase amount of physical activity.;Long Term: Exercising regularly at least 3-5 days a week. Short Term: Attend rehab on a regular basis to increase amount of physical activity.;Long Term: Add in home exercise to make exercise part of routine and to increase amount of physical activity.;Long Term: Exercising regularly at least 3-5 days a week.      Increase Strength and Stamina Yes Yes      Intervention Provide advice, education, support and counseling about physical activity/exercise needs.;Develop an individualized exercise prescription for aerobic and resistive training based on initial evaluation findings, risk stratification, comorbidities and participant's personal goals. Provide advice, education, support and counseling about physical activity/exercise  needs.;Develop  an individualized exercise prescription for aerobic and resistive training based on initial evaluation findings, risk stratification, comorbidities and participant's personal goals.      Expected Outcomes Short Term: Increase workloads from initial exercise prescription for resistance, speed, and METs.;Short Term: Perform resistance training exercises routinely during rehab and add in resistance training at home;Long Term: Improve cardiorespiratory fitness, muscular endurance and strength as measured by increased METs and functional capacity (6MWT) Short Term: Increase workloads from initial exercise prescription for resistance, speed, and METs.;Short Term: Perform resistance training exercises routinely during rehab and add in resistance training at home;Long Term: Improve cardiorespiratory fitness, muscular endurance and strength as measured by increased METs and functional capacity (6MWT)      Able to understand and use rate of perceived exertion (RPE) scale Yes Yes      Intervention Provide education and explanation on how to use RPE scale Provide education and explanation on how to use RPE scale      Expected Outcomes Short Term: Able to use RPE daily in rehab to express subjective intensity level;Long Term:  Able to use RPE to guide intensity level when exercising independently Short Term: Able to use RPE daily in rehab to express subjective intensity level;Long Term:  Able to use RPE to guide intensity level when exercising independently      Knowledge and understanding of Target Heart Rate Range (THRR) Yes Yes      Intervention Provide education and explanation of THRR including how the numbers were predicted and where they are located for reference Provide education and explanation of THRR including how the numbers were predicted and where they are located for reference      Expected Outcomes Long Term: Able to use THRR to govern intensity when exercising independently;Short Term: Able  to state/look up THRR;Short Term: Able to use daily as guideline for intensity in rehab Long Term: Able to use THRR to govern intensity when exercising independently;Short Term: Able to state/look up THRR;Short Term: Able to use daily as guideline for intensity in rehab      Able to check pulse independently Yes Yes      Intervention Provide education and demonstration on how to check pulse in carotid and radial arteries.;Review the importance of being able to check your own pulse for safety during independent exercise Provide education and demonstration on how to check pulse in carotid and radial arteries.;Review the importance of being able to check your own pulse for safety during independent exercise      Expected Outcomes Short Term: Able to explain why pulse checking is important during independent exercise;Long Term: Able to check pulse independently and accurately Short Term: Able to explain why pulse checking is important during independent exercise;Long Term: Able to check pulse independently and accurately      Understanding of Exercise Prescription Yes Yes      Intervention Provide education, explanation, and written materials on patient's individual exercise prescription Provide education, explanation, and written materials on patient's individual exercise prescription      Expected Outcomes Short Term: Able to explain program exercise prescription;Long Term: Able to explain home exercise prescription to exercise independently Short Term: Able to explain program exercise prescription;Long Term: Able to explain home exercise prescription to exercise independently               Exercise Goals Re-Evaluation :  Exercise Goals Re-Evaluation     [place] Name 05/08/22 1031             Exercise Goal  Re-Evaluation   Exercise Goals Review Increase Physical Activity;Increase Strength and Stamina;Able to understand and use rate of perceived exertion (RPE) scale;Knowledge and understanding of  Target Heart Rate Range (THRR);Able to check pulse independently;Understanding of Exercise Prescription       Comments Pt has completed 8 sessions of cardiac rehab. She is motivated in class and is starting to push herself to increase her wokrload. She is currently exercising at 2.44 METs on the stepper. Will continue to monitor and progress as able.       Expected Outcomes Through exercise at home and at rehab, the patient will meet their stated goals.                 Discharge Exercise Prescription (Final Exercise Prescription Changes):  Exercise Prescription Changes - 05/08/22 1000       Response to Exercise   Blood Pressure (Admit) 100/56    Blood Pressure (Exercise) 110/56    Blood Pressure (Exit) 90/50    Heart Rate (Admit) 77 bpm    Heart Rate (Exercise) 104 bpm    Heart Rate (Exit) 86 bpm    Rating of Perceived Exertion (Exercise) 11    Duration Continue with 30 min of aerobic exercise without signs/symptoms of physical distress.    Intensity THRR unchanged      Progression   Progression Continue to progress workloads to maintain intensity without signs/symptoms of physical distress.      Resistance Training   Training Prescription Yes    Weight 5    Reps 10-15    Time 10 Minutes      Treadmill   MPH 1    Grade 0    Minutes 17    METs 1.77      NuStep   Level 2    SPM 93    Minutes 22    METs 2.44             Nutrition:  Target Goals: Understanding of nutrition guidelines, daily intake of sodium '1500mg'$ , cholesterol '200mg'$ , calories 30% from fat and 7% or less from saturated fats, daily to have 5 or more servings of fruits and vegetables.  Biometrics:  Pre Biometrics - 04/11/22 0950       Pre Biometrics   Height '5\' 5"'$  (1.651 m)    Weight 88.7 kg    Waist Circumference 42 inches    Hip Circumference 43 inches    Waist to Hip Ratio 0.98 %    BMI (Calculated) 32.54    Triceps Skinfold 25 mm    % Body Fat 43 %    Grip Strength 18.8 kg     Flexibility 8 in    Single Leg Stand 3.48 seconds              Nutrition Therapy Plan and Nutrition Goals:  Nutrition Therapy & Goals - 05/01/22 0904       Personal Nutrition Goals   Comments Patient scored 3 on her diet assessment. We offer 2 educational sessions on heart healthy nutrition with handouts.      Intervention Plan   Intervention Nutrition handout(s) given to patient.    Expected Outcomes Short Term Goal: Understand basic principles of dietary content, such as calories, fat, sodium, cholesterol and nutrients.             Nutrition Assessments:  Nutrition Assessments - 04/11/22 0906       MEDFICTS Scores   Pre Score 3  MEDIFICTS Score Key: ?70 Need to make dietary changes  40-70 Heart Healthy Diet ? 40 Therapeutic Level Cholesterol Diet   Picture Your Plate Scores: <13 Unhealthy dietary pattern with much room for improvement. 41-50 Dietary pattern unlikely to meet recommendations for good health and room for improvement. 51-60 More healthful dietary pattern, with some room for improvement.  >60 Healthy dietary pattern, although there may be some specific behaviors that could be improved.    Nutrition Goals Re-Evaluation:   Nutrition Goals Discharge (Final Nutrition Goals Re-Evaluation):   Psychosocial: Target Goals: Acknowledge presence or absence of significant depression and/or stress, maximize coping skills, provide positive support system. Participant is able to verbalize types and ability to use techniques and skills needed for reducing stress and depression.  Initial Review & Psychosocial Screening:  Initial Psych Review & Screening - 04/11/22 0902       Initial Review   Current issues with Current Depression;Current Anxiety/Panic;Current Stress Concerns    Source of Stress Concerns Family    Comments Her significant other, who she has lived with for 20 years, is now 67 and in declining health. She has been his caretaker  and has been stressful for her especially now that she is recovering from her AVR surgery.      Family Dynamics   Good Support System? Yes    Comments Her support system includes her daughters and her friends.      Barriers   Psychosocial barriers to participate in program The patient should benefit from training in stress management and relaxation.      Screening Interventions   Interventions Encouraged to exercise;Provide feedback about the scores to participant    Expected Outcomes Long Term goal: The participant improves quality of Life and PHQ9 Scores as seen by post scores and/or verbalization of changes;Short Term goal: Identification and review with participant of any Quality of Life or Depression concerns found by scoring the questionnaire.;Long Term Goal: Stressors or current issues are controlled or eliminated.             Quality of Life Scores:  Quality of Life - 04/11/22 0950       Quality of Life   Select Quality of Life      Quality of Life Scores   Health/Function Pre 27.2 %    Socioeconomic Pre 30 %    Psych/Spiritual Pre 27.43 %    Family Pre 28.3 %    GLOBAL Pre 28.04 %            Scores of 19 and below usually indicate a poorer quality of life in these areas.  A difference of  2-3 points is a clinically meaningful difference.  A difference of 2-3 points in the total score of the Quality of Life Index has been associated with significant improvement in overall quality of life, self-image, physical symptoms, and general health in studies assessing change in quality of life.  PHQ-9: Review Flowsheet  More data exists      04/11/2022 09/27/2020 08/09/2020 04/20/2020 08/15/2019  Depression screen PHQ 2/9  Decreased Interest 1 0 0 0 1  Down, Depressed, Hopeless 0 0 0 0 1  PHQ - 2 Score 1 0 0 0 2  Altered sleeping 0 - 0 0 1  Tired, decreased energy 1 - 0 2 1  Change in appetite 0 - 0 0 0  Feeling bad or failure about yourself  0 - 0 0 0  Trouble  concentrating 0 - 1 1 0  Moving slowly or fidgety/restless 0 - 0 0 0  Suicidal thoughts 0 - 0 0 0  PHQ-9 Score 2 - '1 3 4  '$ Difficult doing work/chores Not difficult at all - Not difficult at all Not difficult at all Not difficult at all   Interpretation of Total Score  Total Score Depression Severity:  1-4 = Minimal depression, 5-9 = Mild depression, 10-14 = Moderate depression, 15-19 = Moderately severe depression, 20-27 = Severe depression   Psychosocial Evaluation and Intervention:  Psychosocial Evaluation - 04/11/22 0921       Psychosocial Evaluation & Interventions   Interventions Stress management education;Relaxation education;Encouraged to exercise with the program and follow exercise prescription    Comments Pt has no barriers to participating in CR. Pt does have anxiety and depression which is treated with citalopram 40 mg daily. She reports that this treatment helps. Pt also has current stress concerns. Her significant other, Sarah Pope, is 65 years old and is in declining health. She has lived with him for the past 20 years. 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 She is hopeful that as she gets her energy back, she will be less stressed about this. She scored a 2 on her PHQ-9 and relates this to her lack of energy and lack of interest due to her lack of energy. She reports that she has a good support system from her two daughters and from her group of friends. Her goals while in the program are to improve her energy levels and to lose weight. She is eager to start the program.    Expected Outcomes Pt's depression and anxiety will continue to be medically treated, she will learn to positively cope with her stressors through provided education, and she will have no other identifiable psychosocial issues.    Continue Psychosocial Services  No Follow up required              Psychosocial Re-Evaluation:  Psychosocial Re-Evaluation     [place] Name 05/01/22 [phone]             Psychosocial Re-Evaluation   Current issues with Current Stress Concerns;Current Depression;Current Anxiety/Panic;Current Psychotropic Meds       Comments Patient is new to the program completing 5 sessions. She continues to have no psychosocial barriers identified. Her depression and anxiety continue to be managed with Citalopram. She demonstrates an interest in improving her health overall and seems to enjoy the sessions. We will continue to monitor her progress.       Expected Outcomes Patient will continue to have no psychosocial barriers identified and her depression and anxiety will continue to be managed.       Interventions Encouraged to attend Cardiac Rehabilitation for the exercise;Relaxation education;Stress management education       Continue Psychosocial Services  No Follow up required         Initial Review   Source of Stress Concerns Family       Comments Her significant other, who she has lived with for 20 years, is now 66 and in declining health. 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                Psychosocial Discharge (Final Psychosocial Re-Evaluation):  Psychosocial Re-Evaluation - 05/01/22 0855       Psychosocial Re-Evaluation  Current issues with Current Stress Concerns;Current Depression;Current Anxiety/Panic;Current Psychotropic Meds    Comments Patient is new to the program completing 5 sessions. She continues to have no psychosocial barriers identified. Her depression and anxiety continue to be managed with Citalopram. She demonstrates an interest in improving her health overall and seems to enjoy the sessions. We will continue to monitor her progress.    Expected Outcomes Patient will continue to have no psychosocial barriers identified and her depression and anxiety will  continue to be managed.    Interventions Encouraged to attend Cardiac Rehabilitation for the exercise;Relaxation education;Stress management education    Continue Psychosocial Services  No Follow up required      Initial Review   Source of Stress Concerns Family    Comments Her significant other, who she has lived with for 20 years, is now 58 and in declining health. 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             Vocational Rehabilitation: Provide vocational rehab assistance to qualifying candidates.   Vocational Rehab Evaluation & Intervention:   Education: Education Goals: Education classes will be provided on a weekly basis, covering required topics. Participant will state understanding/return demonstration of topics presented.  Learning Barriers/Preferences:  Learning Barriers/Preferences - 04/11/22 0907       Learning Barriers/Preferences   Learning Barriers Sight;Hearing    Learning Preferences Skilled Demonstration             Education Topics: Hypertension, Hypertension Reduction -Define heart disease and high blood pressure. Discus how high blood pressure affects the body and ways to reduce high blood pressure. Flowsheet Row CARDIAC REHAB PHASE II EXERCISE from 05/03/2022 in [place]  Date 04/26/22  Educator [place]  Instruction Review Code 1- Verbalizes Understanding       Exercise and Your Heart -Discuss why it is important to exercise, the FITT principles of exercise, normal and abnormal responses to exercise, and how to exercise safely. Flowsheet Row CARDIAC REHAB PHASE II EXERCISE from 05/03/2022 in [place]  Date 05/03/22  Educator [place]  Instruction Review Code 1- Verbalizes Understanding       Angina -Discuss definition of angina, causes of angina, treatment of angina, and how to decrease risk of having  angina.   Cardiac Medications -Review what the following cardiac medications are used for, how they affect the body, and side effects that may occur when taking the medications.  Medications include Aspirin, Beta blockers, calcium channel blockers, ACE Inhibitors, angiotensin receptor blockers, diuretics, digoxin, and antihyperlipidemics.   Congestive Heart Failure -Discuss the definition of CHF, how to live with CHF, the signs and symptoms of CHF, and how keep track of weight and sodium intake.   Heart Disease and Intimacy -Discus the effect sexual activity has on the heart, how changes occur during intimacy as we age, and safety during sexual activity.   Smoking Cessation / COPD -Discuss different methods to quit smoking, the health benefits of quitting smoking, and the definition of COPD.   Nutrition I: Fats -Discuss the types of cholesterol, what cholesterol does to the heart, and how cholesterol levels can be controlled.   Nutrition II: Labels -Discuss the different components of food labels and how to read food label   Heart Parts/Heart Disease and PAD -Discuss the anatomy of the heart, the pathway of blood circulation through the heart, and these are affected by heart  disease.   Stress I: Signs and Symptoms -Discuss the causes of stress, how stress may lead to anxiety and depression, and ways to limit stress.   Stress II: Relaxation -Discuss different types of relaxation techniques to limit stress. Flowsheet Row CARDIAC REHAB PHASE II EXERCISE from 05/03/2022 in [place]  Date 04/19/22  Educator [place]  Instruction Review Code 1- Verbalizes Understanding       Warning Signs of Stroke / TIA -Discuss definition of a stroke, what the signs and symptoms are of a stroke, and how to identify when someone is having stroke.   Knowledge Questionnaire Score:  Knowledge Questionnaire Score - 04/11/22 0908       Knowledge Questionnaire Score   Pre  Score 15/24             Core Components/Risk Factors/Patient Goals at Admission:  Personal Goals and Risk Factors at Admission - 04/11/22 0914       Core Components/Risk Factors/Patient Goals on Admission    Weight Management Yes;Obesity;Weight Loss    Intervention Weight Management: Develop a combined nutrition and exercise program designed to reach desired caloric intake, while maintaining appropriate intake of nutrient and fiber, sodium and fats, and appropriate energy expenditure required for the weight goal.;Weight Management: Provide education and appropriate resources to help participant work on and attain dietary goals.;Weight Management/Obesity: Establish reasonable short term and long term weight goals.;Obesity: Provide education and appropriate resources to help participant work on and attain dietary goals.    Expected Outcomes Short Term: Continue to assess and modify interventions until short term weight is achieved;Long Term: Adherence to nutrition and physical activity/exercise program aimed toward attainment of established weight goal;Weight Maintenance: Understanding of the daily nutrition guidelines, which includes 25-35% calories from fat, 7% or less cal from saturated fats, less than '200mg'$  cholesterol, less than 1.5gm of sodium, & 5 or more servings of fruits and vegetables daily;Weight Loss: Understanding of general recommendations for a balanced deficit meal plan, which promotes 1-2 lb weight loss per week and includes a negative energy balance of [phone] kcal/d;Understanding recommendations for meals to include 15-35% energy as protein, 25-35% energy from fat, 35-60% energy from carbohydrates, less than '200mg'$  of dietary cholesterol, 20-35 gm of total fiber daily;Understanding of distribution of calorie intake throughout the day with the consumption of 4-5 meals/snacks    Diabetes Yes    Intervention Provide education about signs/symptoms and action to take for  hypo/hyperglycemia.;Provide education about proper nutrition, including hydration, and aerobic/resistive exercise prescription along with prescribed medications to achieve blood glucose in normal ranges: Fasting glucose 65-99 mg/dL    Expected Outcomes Short Term: Participant verbalizes understanding of the signs/symptoms and immediate care of hyper/hypoglycemia, proper foot care and importance of medication, aerobic/resistive exercise and nutrition plan for blood glucose control.;Long Term: Attainment of HbA1C < 7%.    Hypertension Yes    Intervention Provide education on lifestyle modifcations including regular physical activity/exercise, weight management, moderate sodium restriction and increased consumption of fresh fruit, vegetables, and low fat dairy, alcohol moderation, and smoking cessation.;Monitor prescription use compliance.    Expected Outcomes Short Term: Continued assessment and intervention until BP is < 140/66m HG in hypertensive participants. < 130/863mHG in hypertensive participants with diabetes, heart failure or chronic kidney disease.;Long Term: Maintenance of blood pressure at goal levels.    Lipids Yes    Intervention Provide education and support for participant on nutrition & aerobic/resistive exercise along with prescribed medications to achieve LDL '70mg'$ , HDL >'40mg'$ .  Expected Outcomes Short Term: Participant states understanding of desired cholesterol values and is compliant with medications prescribed. Participant is following exercise prescription and nutrition guidelines.;Long Term: Cholesterol controlled with medications as prescribed, with individualized exercise RX and with personalized nutrition plan. Value goals: LDL < '70mg'$ , HDL > 40 mg.    Personal Goal Other Yes    Personal Goal Improve energy levels.    Intervention Attend CR three days per week and begin a home exercise program.    Expected Outcomes Pt will meet stated goal.             Core  Components/Risk Factors/Patient Goals Review:   Goals and Risk Factor Review     Row Name 05/01/22 0859             Core Components/Risk Factors/Patient Goals Review   Personal Goals Review Weight Management/Obesity;Hypertension;Lipids;Diabetes;Other       Review Patient was referred to CR with S/P AVR. She does have multipe risk factors for CAD and is participating in the program for risk modification. She is new to the program completing 5 sessions. Her current weight is 195.2 lbs unchanged from her initial weight. Her blood pressure is well controlled. Her last A1C was 02/02/22 at 8.6% which is trending down. She is on Metformin and Ozempic for DM controll. Her personal goals for the program are to improve her energy levels and lose weight. We will continue to monitor her progress as she works towards meeting these personal goals.       Expected Outcomes Patient will complete the program meeting both perosnal and program goals.                Core Components/Risk Factors/Patient Goals at Discharge (Final Review):   Goals and Risk Factor Review - 05/01/22 0859       Core Components/Risk Factors/Patient Goals Review   Personal Goals Review Weight Management/Obesity;Hypertension;Lipids;Diabetes;Other    Review Patient was referred to CR with S/P AVR. She does have multipe risk factors for CAD and is participating in the program for risk modification. She is new to the program completing 5 sessions. Her current weight is 195.2 lbs unchanged from her initial weight. Her blood pressure is well controlled. Her last A1C was 02/02/22 at 8.6% which is trending down. She is on Metformin and Ozempic for DM controll. Her personal goals for the program are to improve her energy levels and lose weight. We will continue to monitor her progress as she works towards meeting these personal goals.    Expected Outcomes Patient will complete the program meeting both perosnal and program goals.              ITP Comments:   Comments: ITP REVIEW Pt is making expected progress toward Cardiac Rehab goals after completing 8 sessions. Recommend continued exercise, life style modification, education, and increased stamina and strength.

## 2022-05-12 ENCOUNTER — Encounter (HOSPITAL_COMMUNITY)
Admission: RE | Admit: 2022-05-12 | Discharge: 2022-05-12 | Disposition: A | Discharge status: Home / Self Care | Payer: HMO | Source: Ambulatory Visit | Attending: Cardiology | Admitting: Cardiology

## 2022-05-12 DIAGNOSIS — Z952 Presence of prosthetic heart valve: Secondary | ICD-10-CM

## 2022-05-12 NOTE — Progress Notes (Signed)
Daily Session Note  Patient Details  Name: Sarah Pope MRN: [id number] Date of Birth: 12/05/1955 Referring Provider:   Flowsheet Row CARDIAC REHAB PHASE II ORIENTATION from 04/11/2022 in [place] [facility]  Referring Provider Dr. Bartle       Encounter Date: 05/12/2022  Check In:  Session Check In - 05/12/22 0923       Check-In   Supervising physician immediately available to respond to emergencies [facility] MD immediately available    Physician(s) Dr J. Branch    Location [facility]    Staff Present Dalton Fletcher, MS, ACSM-CEP, Exercise Physiologist;Heather Jachimiak, BS, Exercise Physiologist;Daphyne Martin, RN, BSN    Virtual Visit No    Medication changes reported     No    Fall or balance concerns reported    Yes    Comments She has not fallen, but reports that she frequently loses her balance but catches herself. She also frequently gets dizzy and lightheaded due to her medications.    Tobacco Cessation No Change    Warm-up and Cool-down Performed as group-led instruction    Resistance Training Performed Yes    VAD Patient? No    PAD/SET Patient? No      Pain Assessment   Currently in Pain? No/denies    Multiple Pain Sites No             Capillary Blood Glucose: No results found for this or any previous visit (from the past 24 hour(s)).    Social History   Tobacco Use  Smoking Status Former   Packs/day: 0.50   Years: 5.00   Total pack years: 2.50   Types: Cigarettes   Quit date: 10/23/1982   Years since quitting: 39.5  Smokeless Tobacco Never    Goals Met:  Independence with exercise equipment Exercise tolerated well No report of concerns or symptoms today Strength training completed today  Goals Unmet:  Not Applicable  Comments: checkout at 1030.   Dr. Jonathan Branch is Medical Director for [facility] 

## 2022-05-15 ENCOUNTER — Encounter (HOSPITAL_COMMUNITY)
Admission: RE | Admit: 2022-05-15 | Discharge: 2022-05-15 | Disposition: A | Discharge status: Home / Self Care | Payer: HMO | Source: Ambulatory Visit | Attending: Cardiology | Admitting: Cardiology

## 2022-05-15 DIAGNOSIS — Z952 Presence of prosthetic heart valve: Secondary | ICD-10-CM | POA: Diagnosis not present

## 2022-05-15 NOTE — Progress Notes (Signed)
Daily Session Note  Patient Details  Name: Sarah Pope MRN: [id number] Date of Birth: December 04, 1955 Referring Provider:   Flowsheet Row CARDIAC REHAB PHASE II ORIENTATION from 04/11/2022 in [place]  Referring Provider Dr. Cyndia Bent       Encounter Date: 05/15/2022  Check In:  Session Check In - 05/15/22 0925       Check-In   Supervising physician immediately available to respond to emergencies [facility] MD immediately available    Physician(s) Dr Domenic Polite    Location [facility]    Staff Present Hoy Register, MS, ACSM-CEP, Exercise Physiologist;Winona Sison Hassell Done, RN, Jennye Moccasin, RN, BSN    Virtual Visit No    Medication changes reported     No    Fall or balance concerns reported    Yes    Comments She has not fallen, but reports that she frequently loses her balance but catches herself. She also frequently gets dizzy and lightheaded due to her medications.    Tobacco Cessation No Change    Warm-up and Cool-down Performed as group-led instruction    Resistance Training Performed Yes    VAD Patient? No    PAD/SET Patient? No      Pain Assessment   Currently in Pain? No/denies    Multiple Pain Sites No             Capillary Blood Glucose: No results found for this or any previous visit (from the past 24 hour(s)).    Social History   Tobacco Use  Smoking Status Former   Packs/day: 0.50   Years: 5.00   Total pack years: 2.50   Types: Cigarettes   Quit date: 10/23/1982   Years since quitting: 39.5  Smokeless Tobacco Never    Goals Met:  Independence with exercise equipment Exercise tolerated well No report of concerns or symptoms today Strength training completed today  Goals Unmet:  Not Applicable  Comments: Checkout at 1030.   Dr. Carlyle Dolly is Medical Director for [facility]

## 2022-05-17 ENCOUNTER — Encounter (HOSPITAL_COMMUNITY): Payer: HMO

## 2022-05-19 ENCOUNTER — Encounter (HOSPITAL_COMMUNITY)
Admission: RE | Admit: 2022-05-19 | Discharge: 2022-05-19 | Disposition: A | Discharge status: Home / Self Care | Payer: HMO | Source: Ambulatory Visit | Attending: Cardiology | Admitting: Cardiology

## 2022-05-19 DIAGNOSIS — Z952 Presence of prosthetic heart valve: Secondary | ICD-10-CM

## 2022-05-19 NOTE — Progress Notes (Signed)
Daily Session Note  Patient Details  Name: Sarah Pope MRN: [id number] Date of Birth: 1956/07/12 Referring Provider:   Flowsheet Row CARDIAC REHAB PHASE II ORIENTATION from 04/11/2022 in [place]  Referring Provider Dr. Cyndia Bent       Encounter Date: 05/19/2022  Check In:  Session Check In - 05/19/22 0916       Check-In   Supervising physician immediately available to respond to emergencies [facility] MD immediately available    Physician(s) Dr Julieanne Manson    Location [facility]    Staff Present Hoy Register, MS, ACSM-CEP, Exercise Physiologist;Durelle Zepeda Hassell Done, RN, BSN    Virtual Visit No    Medication changes reported     No    Fall or balance concerns reported    Yes    Comments She has not fallen, but reports that she frequently loses her balance but catches herself. She also frequently gets dizzy and lightheaded due to her medications.    Tobacco Cessation No Change    Warm-up and Cool-down Performed as group-led instruction    Resistance Training Performed Yes    VAD Patient? No    PAD/SET Patient? No      Pain Assessment   Currently in Pain? No/denies    Multiple Pain Sites No             Capillary Blood Glucose: No results found for this or any previous visit (from the past 24 hour(s)).    Social History   Tobacco Use  Smoking Status Former   Packs/day: 0.50   Years: 5.00   Total pack years: 2.50   Types: Cigarettes   Quit date: 10/23/1982   Years since quitting: 39.5  Smokeless Tobacco Never    Goals Met:  Independence with exercise equipment Exercise tolerated well No report of concerns or symptoms today Strength training completed today  Goals Unmet:  Not Applicable  Comments: Checkout at 1030.   Dr. Carlyle Dolly is Medical Director for [facility]

## 2022-05-22 ENCOUNTER — Encounter (HOSPITAL_COMMUNITY)
Admission: RE | Admit: 2022-05-22 | Discharge: 2022-05-22 | Disposition: A | Discharge status: Home / Self Care | Payer: HMO | Source: Ambulatory Visit | Attending: Cardiology | Admitting: Cardiology

## 2022-05-22 VITALS — Wt 198.2 lb

## 2022-05-22 DIAGNOSIS — Z952 Presence of prosthetic heart valve: Secondary | ICD-10-CM | POA: Diagnosis not present

## 2022-05-22 NOTE — Progress Notes (Signed)
I have reviewed a Home Exercise Prescription with Sarah Pope . Sarah Pope is currently exercising at home.  The patient was advised to walk 5 days a week for 30-45 minutes.  Sarah Pope and I discussed how to progress their exercise prescription.  The patient stated that their goals were to maintain Sarah Pope fitness and will be attending a gym.  The patient stated that they understand the exercise prescription.  We reviewed exercise guidelines, target heart rate during exercise, RPE Scale, weather conditions, NTG use, endpoints for exercise, warmup and cool down.  Patient is encouraged to come to me with any questions. I will continue to follow up with the patient to assist them with progression and safety.

## 2022-05-22 NOTE — Progress Notes (Signed)
Daily Session Note  Patient Details  Name: Sarah Pope MRN: [id number] Date of Birth: [birth year] Referring Provider:   Flowsheet Row CARDIAC REHAB PHASE II ORIENTATION from 04/11/2022 in [place]  Referring Provider Dr. Cyndia Bent       Encounter Date: 05/22/2022  Check In:  Session Check In - 05/22/22 0915       Check-In   Supervising physician immediately available to respond to emergencies [facility] MD immediately available    Physician(s) Dr. Harrington Challenger    Location [facility]    Staff Present Redge Gainer, BS, Exercise Physiologist;Dalton Kris Mouton, MS, ACSM-CEP, Exercise Physiologist    Virtual Visit No    Medication changes reported     No    Fall or balance concerns reported    Yes    Comments She has not fallen, but reports that she frequently loses her balance but catches herself. She also frequently gets dizzy and lightheaded due to her medications.    Tobacco Cessation No Change    Warm-up and Cool-down Performed as group-led instruction    Resistance Training Performed Yes    VAD Patient? No    PAD/SET Patient? No      Pain Assessment   Currently in Pain? No/denies    Multiple Pain Sites No             Capillary Blood Glucose: No results found for this or any previous visit (from the past 24 hour(s)).    Social History   Tobacco Use  Smoking Status Former   Packs/day: 0.50   Years: 5.00   Total pack years: 2.50   Types: Cigarettes   Quit date: 10/23/1982   Years since quitting: 39.6  Smokeless Tobacco Never    Goals Met:  Independence with exercise equipment Exercise tolerated well No report of concerns or symptoms today Strength training completed today  Goals Unmet:  Not Applicable  Comments: check out 1030   Dr. Carlyle Dolly is Medical Director for [place]

## 2022-05-24 ENCOUNTER — Encounter (HOSPITAL_COMMUNITY)
Admission: RE | Admit: 2022-05-24 | Discharge: 2022-05-24 | Disposition: A | Discharge status: Home / Self Care | Payer: HMO | Source: Ambulatory Visit | Attending: Cardiology | Admitting: Cardiology

## 2022-05-24 DIAGNOSIS — Z952 Presence of prosthetic heart valve: Secondary | ICD-10-CM | POA: Insufficient documentation

## 2022-05-24 NOTE — Progress Notes (Signed)
Daily Session Note  Patient Details  Name: Sarah Pope MRN: [id number] Date of Birth: 07/21/1956 Referring Provider:   Flowsheet Row CARDIAC REHAB PHASE II ORIENTATION from 04/11/2022 in [place]  Referring Provider Dr. Cyndia Bent       Encounter Date: 05/24/2022  Check In:  Session Check In - 05/24/22 0930       Check-In   Supervising physician immediately available to respond to emergencies [facility] MD immediately available    Physician(s) Dr. Johney Frame    Location [facility]    Staff Present Geanie Cooley, RN;Heather Otho Ket, BS, Exercise Physiologist;Dalton Kris Mouton, MS, ACSM-CEP, Exercise Physiologist;Daphyne Hassell Done, RN, BSN    Virtual Visit No    Medication changes reported     No    Fall or balance concerns reported    Yes    Comments She has not fallen, but reports that she frequently loses her balance but catches herself. She also frequently gets dizzy and lightheaded due to her medications.    Tobacco Cessation No Change    Warm-up and Cool-down Performed as group-led instruction    Resistance Training Performed Yes    VAD Patient? No    PAD/SET Patient? No      Pain Assessment   Currently in Pain? No/denies    Multiple Pain Sites No             Capillary Blood Glucose: No results found for this or any previous visit (from the past 24 hour(s)).    Social History   Tobacco Use  Smoking Status Former   Packs/day: 0.50   Years: 5.00   Total pack years: 2.50   Types: Cigarettes   Quit date: 10/23/1982   Years since quitting: 39.6  Smokeless Tobacco Never    Goals Met:  Independence with exercise equipment Exercise tolerated well No report of concerns or symptoms today Strength training completed today  Goals Unmet:  Not Applicable  Comments: check out @ 10:30am   Dr. Carlyle Dolly is Medical Director for [place]

## 2022-05-26 ENCOUNTER — Encounter (HOSPITAL_COMMUNITY)
Admission: RE | Admit: 2022-05-26 | Discharge: 2022-05-26 | Disposition: A | Discharge status: Home / Self Care | Payer: HMO | Source: Ambulatory Visit | Attending: Cardiology | Admitting: Cardiology

## 2022-05-26 DIAGNOSIS — Z952 Presence of prosthetic heart valve: Secondary | ICD-10-CM | POA: Diagnosis not present

## 2022-05-26 NOTE — Progress Notes (Signed)
Daily Session Note  Patient Details  Name: EVALUNA UTKE MRN: [id number] Date of Birth: 07/02/1956 Referring Provider:   Flowsheet Row CARDIAC REHAB PHASE II ORIENTATION from 04/11/2022 in [place]  Referring Provider Dr. Cyndia Bent       Encounter Date: 05/26/2022  Check In:  Session Check In - 05/26/22 0925       Check-In   Supervising physician immediately available to respond to emergencies [facility] MD immediately available    Physician(s) Marisue Ivan    Location [facility]    Staff Present Geanie Cooley, RN;Heather Otho Ket, BS, Exercise Physiologist;Dalton Kris Mouton, MS, ACSM-CEP, Exercise Physiologist;Detravion Tester Hassell Done, RN, BSN    Virtual Visit No    Medication changes reported     No    Fall or balance concerns reported    Yes    Comments She has not fallen, but reports that she frequently loses her balance but catches herself. She also frequently gets dizzy and lightheaded due to her medications.    Tobacco Cessation No Change    Warm-up and Cool-down Performed as group-led instruction    Resistance Training Performed Yes    VAD Patient? No    PAD/SET Patient? No      Pain Assessment   Currently in Pain? No/denies    Multiple Pain Sites No             Capillary Blood Glucose: No results found for this or any previous visit (from the past 24 hour(s)).    Social History   Tobacco Use  Smoking Status Former   Packs/day: 0.50   Years: 5.00   Total pack years: 2.50   Types: Cigarettes   Quit date: 10/23/1982   Years since quitting: 39.6  Smokeless Tobacco Never    Goals Met:  Independence with exercise equipment Exercise tolerated well No report of concerns or symptoms today Strength training completed today  Goals Unmet:  Not Applicable  Comments: Checkout at 1030.   Dr. Carlyle Dolly is Medical Director for [facility]

## 2022-05-29 ENCOUNTER — Encounter (HOSPITAL_COMMUNITY): Payer: HMO

## 2022-05-31 ENCOUNTER — Encounter (HOSPITAL_COMMUNITY)
Admission: RE | Admit: 2022-05-31 | Discharge: 2022-05-31 | Disposition: A | Discharge status: Home / Self Care | Payer: HMO | Source: Ambulatory Visit | Attending: Cardiology | Admitting: Cardiology

## 2022-05-31 DIAGNOSIS — Z952 Presence of prosthetic heart valve: Secondary | ICD-10-CM | POA: Diagnosis not present

## 2022-05-31 NOTE — Progress Notes (Signed)
Daily Session Note  Patient Details  Name: ROYCE SCIARA MRN: [id number] Date of Birth: 09/26/1956 Referring Provider:   Flowsheet Row CARDIAC REHAB PHASE II ORIENTATION from 04/11/2022 in [place]  Referring Provider Dr. Cyndia Bent       Encounter Date: 05/31/2022  Check In:  Session Check In - 05/31/22 0930       Check-In   Supervising physician immediately available to respond to emergencies [facility] MD immediately available    Physician(s) Dr. Phineas Inches    Location [facility]    Staff Present Geanie Cooley, RN;Heather Otho Ket, BS, Exercise Physiologist;Dalton Kris Mouton, MS, ACSM-CEP, Exercise Physiologist;Daphyne Hassell Done, RN, Jennye Moccasin, RN, BSN    Virtual Visit No    Medication changes reported     No    Fall or balance concerns reported    Yes    Comments She has not fallen, but reports that she frequently loses her balance but catches herself. She also frequently gets dizzy and lightheaded due to her medications.    Tobacco Cessation No Change    Warm-up and Cool-down Performed as group-led instruction    Resistance Training Performed Yes    VAD Patient? No    PAD/SET Patient? No      Pain Assessment   Currently in Pain? No/denies    Multiple Pain Sites No             Capillary Blood Glucose: No results found for this or any previous visit (from the past 24 hour(s)).    Social History   Tobacco Use  Smoking Status Former   Packs/day: 0.50   Years: 5.00   Total pack years: 2.50   Types: Cigarettes   Quit date: 10/23/1982   Years since quitting: 39.6  Smokeless Tobacco Never    Goals Met:  Independence with exercise equipment Exercise tolerated well No report of concerns or symptoms today Strength training completed today  Goals Unmet:  Not Applicable  Comments: checkout @ 10:30am   Dr. Carlyle Dolly is Medical Director for [place]

## 2022-06-02 ENCOUNTER — Encounter (HOSPITAL_COMMUNITY)
Admission: RE | Admit: 2022-06-02 | Discharge: 2022-06-02 | Disposition: A | Discharge status: Home / Self Care | Payer: HMO | Source: Ambulatory Visit | Attending: Cardiology | Admitting: Cardiology

## 2022-06-02 DIAGNOSIS — Z952 Presence of prosthetic heart valve: Secondary | ICD-10-CM | POA: Diagnosis not present

## 2022-06-02 NOTE — Progress Notes (Signed)
Daily Session Note  Patient Details  Name: Sarah Pope MRN: [id number] Date of Birth: [birth year] Referring Provider:   Flowsheet Row CARDIAC REHAB PHASE II ORIENTATION from 04/11/2022 in [place]  Referring Provider Dr. Cyndia Bent       Encounter Date: 06/02/2022  Check In:  Session Check In - 06/02/22 0929       Check-In   Supervising physician immediately available to respond to emergencies [facility] MD immediately available    Physician(s) Dr. Harl Bowie    Staff Present Geanie Cooley, RN;Heather Otho Ket, BS, Exercise Physiologist;Dalton Kris Mouton, MS, ACSM-CEP, Exercise Physiologist;Debra Wynetta Emery, RN, BSN    Virtual Visit No    Medication changes reported     No    Fall or balance concerns reported    Yes    Comments She has not fallen, but reports that she frequently loses her balance but catches herself. She also frequently gets dizzy and lightheaded due to her medications.    Tobacco Cessation No Change    Warm-up and Cool-down Performed as group-led instruction    Resistance Training Performed Yes    VAD Patient? No    PAD/SET Patient? No      Pain Assessment   Currently in Pain? No/denies    Multiple Pain Sites No             Capillary Blood Glucose: No results found for this or any previous visit (from the past 24 hour(s)).    Social History   Tobacco Use  Smoking Status Former   Packs/day: 0.50   Years: 5.00   Total pack years: 2.50   Types: Cigarettes   Quit date: 10/23/1982   Years since quitting: 39.6  Smokeless Tobacco Never    Goals Met:  Independence with exercise equipment Exercise tolerated well No report of concerns or symptoms today Strength training completed today  Goals Unmet:  Not Applicable  Comments: checkout @ 10:30   Dr. Carlyle Dolly is Medical Director for [place]

## 2022-06-05 ENCOUNTER — Encounter (HOSPITAL_COMMUNITY)
Admission: RE | Admit: 2022-06-05 | Discharge: 2022-06-05 | Disposition: A | Discharge status: Home / Self Care | Payer: HMO | Source: Ambulatory Visit | Attending: Cardiology | Admitting: Cardiology

## 2022-06-05 VITALS — Wt 194.4 lb

## 2022-06-05 DIAGNOSIS — Z952 Presence of prosthetic heart valve: Secondary | ICD-10-CM | POA: Diagnosis not present

## 2022-06-05 NOTE — Progress Notes (Signed)
Daily Session Note  Patient Details  Name: Sarah Pope MRN: [id number] Date of Birth: 28-Aug-1956 Referring Provider:   Flowsheet Row CARDIAC REHAB PHASE II ORIENTATION from 04/11/2022 in [place]  Referring Provider Dr. Cyndia Bent       Encounter Date: 06/05/2022  Check In:  Session Check In - 06/05/22 0930       Check-In   Supervising physician immediately available to respond to emergencies [facility] MD immediately available    Physician(s) Dr. Domenic Polite    Location [facility]    Staff Present Redge Gainer, BS, Exercise Physiologist;Dalton Kris Mouton, MS, ACSM-CEP, Exercise Physiologist    Virtual Visit No    Medication changes reported     No    Fall or balance concerns reported    Yes    Comments She has not fallen, but reports that she frequently loses her balance but catches herself. She also frequently gets dizzy and lightheaded due to her medications.    Tobacco Cessation No Change    Warm-up and Cool-down Performed as group-led instruction    Resistance Training Performed Yes    VAD Patient? No    PAD/SET Patient? No      Pain Assessment   Currently in Pain? No/denies    Multiple Pain Sites No             Capillary Blood Glucose: No results found for this or any previous visit (from the past 24 hour(s)).    Social History   Tobacco Use  Smoking Status Former   Packs/day: 0.50   Years: 5.00   Total pack years: 2.50   Types: Cigarettes   Quit date: 10/23/1982   Years since quitting: 39.6  Smokeless Tobacco Never    Goals Met:  Independence with exercise equipment Exercise tolerated well No report of concerns or symptoms today Strength training completed today  Goals Unmet:  Not Applicable  Comments: check out 1030   Dr. Carlyle Dolly is Medical Director for [place]

## 2022-06-06 DIAGNOSIS — E1169 Type 2 diabetes mellitus with other specified complication: Secondary | ICD-10-CM | POA: Diagnosis not present

## 2022-06-06 DIAGNOSIS — I2510 Atherosclerotic heart disease of native coronary artery without angina pectoris: Secondary | ICD-10-CM | POA: Diagnosis not present

## 2022-06-06 DIAGNOSIS — J45909 Unspecified asthma, uncomplicated: Secondary | ICD-10-CM | POA: Diagnosis not present

## 2022-06-06 DIAGNOSIS — E039 Hypothyroidism, unspecified: Secondary | ICD-10-CM | POA: Diagnosis not present

## 2022-06-06 DIAGNOSIS — Z952 Presence of prosthetic heart valve: Secondary | ICD-10-CM | POA: Diagnosis not present

## 2022-06-06 DIAGNOSIS — K219 Gastro-esophageal reflux disease without esophagitis: Secondary | ICD-10-CM | POA: Diagnosis not present

## 2022-06-06 DIAGNOSIS — E1165 Type 2 diabetes mellitus with hyperglycemia: Secondary | ICD-10-CM | POA: Diagnosis not present

## 2022-06-06 DIAGNOSIS — Z6832 Body mass index (BMI) 32.0-32.9, adult: Secondary | ICD-10-CM | POA: Diagnosis not present

## 2022-06-06 DIAGNOSIS — E785 Hyperlipidemia, unspecified: Secondary | ICD-10-CM | POA: Diagnosis not present

## 2022-06-06 DIAGNOSIS — F419 Anxiety disorder, unspecified: Secondary | ICD-10-CM | POA: Diagnosis not present

## 2022-06-06 DIAGNOSIS — I10 Essential (primary) hypertension: Secondary | ICD-10-CM | POA: Diagnosis not present

## 2022-06-07 ENCOUNTER — Encounter (HOSPITAL_COMMUNITY)
Admission: RE | Admit: 2022-06-07 | Discharge: 2022-06-07 | Disposition: A | Discharge status: Home / Self Care | Payer: HMO | Source: Ambulatory Visit | Attending: Cardiology | Admitting: Cardiology

## 2022-06-07 DIAGNOSIS — Z952 Presence of prosthetic heart valve: Secondary | ICD-10-CM

## 2022-06-07 NOTE — Progress Notes (Signed)
Cardiac Individual Treatment Plan  Patient Details  Name: Sarah Pope MRN: [id number] Date of Birth: 01/28/1956 Referring Provider:   Flowsheet Row CARDIAC REHAB PHASE II ORIENTATION from 04/11/2022 in [place]  Referring Provider Dr. Cyndia Bent       Initial Encounter Date:  Flowsheet Row CARDIAC REHAB PHASE II ORIENTATION from 04/11/2022 in [facility]  Date 04/11/22       Visit Diagnosis: S/P AVR  Patient's Home Medications on Admission:  Current Outpatient Medications:    acetaminophen (TYLENOL) 325 MG tablet, Take 2 tablets (650 mg total) by mouth every 6 (six) hours as needed for mild pain., Disp: , Rfl:    albuterol (VENTOLIN HFA) 108 (90 Base) MCG/ACT inhaler, Inhale 2 puffs into the lungs every 6 (six) hours as needed for shortness of breath., Disp: , Rfl:    amoxicillin (AMOXIL) 500 MG capsule, Take 500 mg by mouth 3 (three) times daily., Disp: , Rfl:    aspirin EC 81 MG tablet, Take 81 mg by mouth daily. Swallow whole., Disp: , Rfl:    atorvastatin (LIPITOR) 20 MG tablet, Take 1 tablet (20 mg total) by mouth daily., Disp: 90 tablet, Rfl: 1   azelastine (ASTELIN) 0.1 % nasal spray, Place 1 spray into both nostrils 2 (two) times daily as needed for rhinitis or allergies. Use in each nostril as directed, Disp: , Rfl:    cetirizine (ZYRTEC) 10 MG tablet, Take 10 mg by mouth daily as needed for allergies., Disp: , Rfl:    ciclopirox (PENLAC) 8 % solution, Apply topically at bedtime. Apply over nail and surrounding skin. Apply daily over previous coat. After seven (7) days, may remove with alcohol and continue cycle. (Patient taking differently: Apply 1 Application topically 3 (three) times a week. Apply over nail and surrounding skin. Apply daily over previous coat. After seven (7) days, may remove with alcohol and continue cycle.), Disp: 6.6 mL, Rfl: 2   citalopram (CELEXA) 40 MG tablet, Take 1 tablet (40 mg total) by mouth daily.,  Disp: 90 tablet, Rfl: 1   Continuous Blood Gluc Receiver (FREESTYLE LIBRE 14 DAY READER) DEVI, USE AS DIRECTED THREE TIMES DAILY., Disp: 1 each, Rfl: PRN   Continuous Blood Gluc Sensor (FREESTYLE LIBRE 14 DAY SENSOR) MISC, USE TO CHECK BLOOD SUGAR THREE TIMES DAILY WITH SLIDING SCALE INSULIN USE., Disp: 2 each, Rfl: 2   diclofenac (VOLTAREN) 75 MG EC tablet, TAKE ONE TABLET BY MOUTH TWICE daily PRN pain. TAKE WITH FOOD. (Patient taking differently: Take 75 mg by mouth See admin instructions. Take 1 tablet (75 mg) by mouth (scheduled) every morning, may take an additional dose if needed for pain.), Disp: 28 tablet, Rfl: 5   Fluticasone-Salmeterol (ADVAIR DISKUS) 250-50 MCG/DOSE AEPB, Inhale 1 puff into the lungs 2 (two) times daily. (Patient taking differently: Inhale 1 puff into the lungs 2 (two) times daily as needed (respiratory issues.).), Disp: 1 each, Rfl: 3   insulin glargine (LANTUS) 100 UNIT/ML Solostar Pen, Inject 80 Units into the skin at bedtime., Disp: 45 mL, Rfl: 3   insulin lispro (HUMALOG) 100 UNIT/ML injection, Inject 0.12-0.18 mLs (12-18 Units total) into the skin 3 (three) times daily with meals. Sliding scale if needed, Disp: , Rfl:    levothyroxine (SYNTHROID) 175 MCG tablet, Take 1 tablet (175 mcg total) by mouth daily., Disp: 90 tablet, Rfl: 1   losartan (COZAAR) 25 MG tablet, Take 1 tablet (25 mg total) by mouth daily., Disp: 90 tablet, Rfl: 3  metFORMIN (GLUCOPHAGE) 1000 MG tablet, Take 1 tablet (1,000 mg total) by mouth 2 (two) times daily with a meal. (Patient taking differently: Take 1,000 mg by mouth as needed.), Disp: 180 tablet, Rfl: 1   metoprolol tartrate (LOPRESSOR) 25 MG tablet, Take 1 tablet (25 mg total) by mouth 2 (two) times daily., Disp: 60 tablet, Rfl: 3   omeprazole (PRILOSEC) 40 MG capsule, Take 1 capsule (40 mg total) by mouth daily., Disp: 90 capsule, Rfl: 1   ondansetron (ZOFRAN) 4 MG tablet, Take 1 tablet (4 mg total) by mouth every 8 (eight) hours as needed  for nausea or vomiting., Disp: 20 tablet, Rfl: 0   potassium chloride SA (KLOR-CON M) 20 MEQ tablet, Take 1 tablet (20 mEq total) by mouth daily., Disp: 7 tablet, Rfl: 0   Semaglutide,0.25 or 0.'5MG'$ /DOS, (OZEMPIC, 0.25 OR 0.5 MG/DOSE,) 2 MG/1.5ML SOPN, Inject 0.5 mg into the skin once a week. (Patient taking differently: Inject 0.5 mg into the skin every Thursday.), Disp: 3 mL, Rfl: 3   traMADol (ULTRAM) 50 MG tablet, Take 1 tablet (50 mg total) by mouth every 6 (six) hours as needed., Disp: 30 tablet, Rfl: 0  Past Medical History: Past Medical History:  Diagnosis Date   Acute respiratory failure with hypercapnia (HCC)    Allergy    Anxiety    Aortic stenosis    a. S/p AVR on 02/06/2022 using a 21 mm Edwards Inspiris Resilina pericardial valve   Asthma    Bronchitis with bronchospasm    Cancer ([place])    skin   Depression    DM type 2 (diabetes mellitus, type 2) ([place])    FATTY LIVER DISEASE 08/05/2008   GERD (gastroesophageal reflux disease)    Hyperlipidemia    Hypertension    Hypothyroidism    Kidney stones    Leukocytosis    Obesity, Class III, BMI 40-49.9 (morbid obesity) ([place])    PERSONAL HX COLONIC POLYPS 09/01/2008   PONV (postoperative nausea and vomiting)    Seasonal allergies    TRANSAMINASES, SERUM, ELEVATED 08/05/2008    Tobacco Use: Social History   Tobacco Use  Smoking Status Former   Packs/day: 0.50   Years: 5.00   Total pack years: 2.50   Types: Cigarettes   Quit date: 10/23/1982   Years since quitting: 39.6  Smokeless Tobacco Never    Labs: Review Flowsheet  More data exists      Latest Ref Rng & Units 08/01/2021 10/25/2021 12/01/2021 02/02/2022 02/06/2022  Labs for ITP Cardiac and Pulmonary Rehab  Hemoglobin A1c 4.8 - 5.6 % 10.2  9.0  - 8.6  -  PH, Arterial 7.35 - 7.45 - - 7.342  7.41  7.308  7.286  7.321  7.417  7.431  7.479  7.486  7.383   PCO2 arterial 32 - 48 mmHg - - 48.7  39  50.2  50.5  46.3  42.7  43.0  37.1  36.7  46.0   Bicarbonate 20.0 - 28.0  mmol/L - - 26.4  28.3  24.7  25.2  24.1  24.2  27.5  28.6  26.8  27.6  27.7  27.4   TCO2 22 - 32 mmol/L - - 28  30  - '27  26  26  27  29  30  29  28  28  29  29  26  28  29   '$ Acid-base deficit 0.0 - 2.0 mmol/L - - - - 1.0  3.0  2.0   O2 Saturation % - -  99.0  76.0  98.6  95  97  93  100  100  83  100  100  100     Capillary Blood Glucose: Lab Results  Component Value Date   GLUCAP 100 (H) 02/11/2022   GLUCAP 226 (H) 02/10/2022   GLUCAP 278 (H) 02/10/2022   GLUCAP 145 (H) 02/10/2022   GLUCAP 95 02/10/2022    POCT Glucose     Row Name 04/11/22 0842             POCT Blood Glucose   Pre-Exercise 151 mg/dL                Exercise Target Goals: Exercise Program Goal: Individual exercise prescription set using results from initial 6 min walk test and THRR while considering  patient's activity barriers and safety.   Exercise Prescription Goal: Starting with aerobic activity 30 plus minutes a day, 3 days per week for initial exercise prescription. Provide home exercise prescription and guidelines that participant acknowledges understanding prior to discharge.  Activity Barriers & Risk Stratification:  Activity Barriers & Cardiac Risk Stratification - 04/11/22 0901       Activity Barriers & Cardiac Risk Stratification   Activity Barriers Joint Problems;Balance Concerns    Cardiac Risk Stratification High             6 Minute Walk:  6 Minute Walk     Row Name 04/11/22 0947         6 Minute Walk   Phase Initial     Distance 1200 feet     Walk Time 6 minutes     # of Rest Breaks 0     MPH 2.27     METS 2.5     RPE 13     VO2 Peak 8.79     Symptoms No     Resting HR 72 bpm     Resting BP 94/64     Resting Oxygen Saturation  97 %     Exercise Oxygen Saturation  during 6 min walk 100 %     Max Ex. HR 85 bpm     Max Ex. BP 118/60     2 Minute Post BP 100/60              Oxygen Initial Assessment:   Oxygen Re-Evaluation:   Oxygen Discharge  (Final Oxygen Re-Evaluation):   Initial Exercise Prescription:  Initial Exercise Prescription - 04/11/22 0900       Date of Initial Exercise RX and Referring Provider   Date 04/11/22    Referring Provider Dr. Cyndia Bent    Expected Discharge Date 07/06/22      Treadmill   MPH 1    Grade 0    Minutes 17      NuStep   Level 1    SPM 60    Minutes 22      Prescription Details   Frequency (times per week) 3    Duration Progress to 30 minutes of continuous aerobic without signs/symptoms of physical distress      Intensity   THRR 40-80% of Max Heartrate 62-125    Ratings of Perceived Exertion 11-13      Resistance Training   Training Prescription Yes    Weight 3    Reps 10-15             Perform Capillary Blood Glucose checks as needed.  Exercise Prescription Changes:   Exercise Prescription Changes     Row Name  04/24/22 1000 05/08/22 1000 05/22/22 1000 06/05/22 1000       Response to Exercise   Blood Pressure (Admit) 102/60 100/56 92/52 110/58    Blood Pressure (Exercise) 110/60 110/56 106/60 118/60    Blood Pressure (Exit) 98/60 90/50 100/60 100/56    Heart Rate (Admit) 75 bpm 77 bpm 73 bpm 77 bpm    Heart Rate (Exercise) 93 bpm 104 bpm 98 bpm 102 bpm    Heart Rate (Exit) 84 bpm 86 bpm 82 bpm 86 bpm    Rating of Perceived Exertion (Exercise) '12 11 12 12    '$ Duration Continue with 30 min of aerobic exercise without signs/symptoms of physical distress. Continue with 30 min of aerobic exercise without signs/symptoms of physical distress. Continue with 30 min of aerobic exercise without signs/symptoms of physical distress. Continue with 30 min of aerobic exercise without signs/symptoms of physical distress.    Intensity THRR unchanged THRR unchanged THRR unchanged THRR unchanged      Progression   Progression Continue to progress workloads to maintain intensity without signs/symptoms of physical distress. Continue to progress workloads to maintain intensity without  signs/symptoms of physical distress. Continue to progress workloads to maintain intensity without signs/symptoms of physical distress. Continue to progress workloads to maintain intensity without signs/symptoms of physical distress.      Resistance Training   Training Prescription Yes Yes Yes Yes    Weight '5 5 5 5    '$ Reps 10-15 10-15 10-15 10-15    Time 10 Minutes 10 Minutes 10 Minutes 10 Minutes      Treadmill   MPH '1 1 2 '$ 2.3    Grade 0 0 0 1    Minutes '17 17 17 17    '$ METs 1.77 1.77 2.53 3.08      NuStep   Level '2 2 4 4    '$ SPM 34 93 83 87    Minutes '22 22 22 22    '$ METs 2.47 2.44 2.53 2.78      Home Exercise Plan   Plans to continue exercise at -- -- Home (comment) --    Frequency -- -- Add 2 additional days to program exercise sessions. --    Initial Home Exercises Provided -- -- 05/22/22 --             Exercise Comments:   Exercise Comments     Row Name 04/17/22 1249 05/22/22 1011         Exercise Comments Pt completed her first exercise session with no complaints. home exercise reviewed               Exercise Goals and Review:   Exercise Goals     Row Name 04/11/22 0949 05/08/22 1030 06/05/22 1024         Exercise Goals   Increase Physical Activity Yes Yes Yes     Intervention Provide advice, education, support and counseling about physical activity/exercise needs.;Develop an individualized exercise prescription for aerobic and resistive training based on initial evaluation findings, risk stratification, comorbidities and participant's personal goals. Provide advice, education, support and counseling about physical activity/exercise needs.;Develop an individualized exercise prescription for aerobic and resistive training based on initial evaluation findings, risk stratification, comorbidities and participant's personal goals. Provide advice, education, support and counseling about physical activity/exercise needs.;Develop an individualized exercise  prescription for aerobic and resistive training based on initial evaluation findings, risk stratification, comorbidities and participant's personal goals.     Expected Outcomes Short Term: Attend rehab on a regular basis  to increase amount of physical activity.;Long Term: Add in home exercise to make exercise part of routine and to increase amount of physical activity.;Long Term: Exercising regularly at least 3-5 days a week. Short Term: Attend rehab on a regular basis to increase amount of physical activity.;Long Term: Add in home exercise to make exercise part of routine and to increase amount of physical activity.;Long Term: Exercising regularly at least 3-5 days a week. Short Term: Attend rehab on a regular basis to increase amount of physical activity.;Long Term: Add in home exercise to make exercise part of routine and to increase amount of physical activity.;Long Term: Exercising regularly at least 3-5 days a week.     Increase Strength and Stamina Yes Yes Yes     Intervention Provide advice, education, support and counseling about physical activity/exercise needs.;Develop an individualized exercise prescription for aerobic and resistive training based on initial evaluation findings, risk stratification, comorbidities and participant's personal goals. Provide advice, education, support and counseling about physical activity/exercise needs.;Develop an individualized exercise prescription for aerobic and resistive training based on initial evaluation findings, risk stratification, comorbidities and participant's personal goals. Provide advice, education, support and counseling about physical activity/exercise needs.;Develop an individualized exercise prescription for aerobic and resistive training based on initial evaluation findings, risk stratification, comorbidities and participant's personal goals.     Expected Outcomes Short Term: Increase workloads from initial exercise prescription for resistance,  speed, and METs.;Short Term: Perform resistance training exercises routinely during rehab and add in resistance training at home;Long Term: Improve cardiorespiratory fitness, muscular endurance and strength as measured by increased METs and functional capacity (6MWT) Short Term: Increase workloads from initial exercise prescription for resistance, speed, and METs.;Short Term: Perform resistance training exercises routinely during rehab and add in resistance training at home;Long Term: Improve cardiorespiratory fitness, muscular endurance and strength as measured by increased METs and functional capacity (6MWT) Short Term: Increase workloads from initial exercise prescription for resistance, speed, and METs.;Short Term: Perform resistance training exercises routinely during rehab and add in resistance training at home;Long Term: Improve cardiorespiratory fitness, muscular endurance and strength as measured by increased METs and functional capacity (6MWT)     Able to understand and use rate of perceived exertion (RPE) scale Yes Yes Yes     Intervention Provide education and explanation on how to use RPE scale Provide education and explanation on how to use RPE scale Provide education and explanation on how to use RPE scale     Expected Outcomes Short Term: Able to use RPE daily in rehab to express subjective intensity level;Long Term:  Able to use RPE to guide intensity level when exercising independently Short Term: Able to use RPE daily in rehab to express subjective intensity level;Long Term:  Able to use RPE to guide intensity level when exercising independently Short Term: Able to use RPE daily in rehab to express subjective intensity level;Long Term:  Able to use RPE to guide intensity level when exercising independently     Knowledge and understanding of Target Heart Rate Range (THRR) Yes Yes Yes     Intervention Provide education and explanation of THRR including how the numbers were predicted and where  they are located for reference Provide education and explanation of THRR including how the numbers were predicted and where they are located for reference Provide education and explanation of THRR including how the numbers were predicted and where they are located for reference     Expected Outcomes Long Term: Able to use THRR to govern intensity  when exercising independently;Short Term: Able to state/look up THRR;Short Term: Able to use daily as guideline for intensity in rehab Long Term: Able to use THRR to govern intensity when exercising independently;Short Term: Able to state/look up THRR;Short Term: Able to use daily as guideline for intensity in rehab Long Term: Able to use THRR to govern intensity when exercising independently;Short Term: Able to state/look up THRR;Short Term: Able to use daily as guideline for intensity in rehab     Able to check pulse independently Yes Yes Yes     Intervention Provide education and demonstration on how to check pulse in carotid and radial arteries.;Review the importance of being able to check your own pulse for safety during independent exercise Provide education and demonstration on how to check pulse in carotid and radial arteries.;Review the importance of being able to check your own pulse for safety during independent exercise Provide education and demonstration on how to check pulse in carotid and radial arteries.;Review the importance of being able to check your own pulse for safety during independent exercise     Expected Outcomes Short Term: Able to explain why pulse checking is important during independent exercise;Long Term: Able to check pulse independently and accurately Short Term: Able to explain why pulse checking is important during independent exercise;Long Term: Able to check pulse independently and accurately Short Term: Able to explain why pulse checking is important during independent exercise;Long Term: Able to check pulse independently and  accurately     Understanding of Exercise Prescription Yes Yes Yes     Intervention Provide education, explanation, and written materials on patient's individual exercise prescription Provide education, explanation, and written materials on patient's individual exercise prescription Provide education, explanation, and written materials on patient's individual exercise prescription     Expected Outcomes Short Term: Able to explain program exercise prescription;Long Term: Able to explain home exercise prescription to exercise independently Short Term: Able to explain program exercise prescription;Long Term: Able to explain home exercise prescription to exercise independently Short Term: Able to explain program exercise prescription;Long Term: Able to explain home exercise prescription to exercise independently              Exercise Goals Re-Evaluation :  Exercise Goals Re-Evaluation     Row Name 05/08/22 1031 06/05/22 1025           Exercise Goal Re-Evaluation   Exercise Goals Review Increase Physical Activity;Increase Strength and Stamina;Able to understand and use rate of perceived exertion (RPE) scale;Knowledge and understanding of Target Heart Rate Range (THRR);Able to check pulse independently;Understanding of Exercise Prescription Increase Physical Activity;Increase Strength and Stamina;Able to understand and use rate of perceived exertion (RPE) scale;Knowledge and understanding of Target Heart Rate Range (THRR);Able to check pulse independently;Understanding of Exercise Prescription      Comments Pt has completed 8 sessions of cardiac rehab. She is motivated in class and is starting to push herself to increase her wokrload. She is currently exercising at 2.44 METs on the stepper. Will continue to monitor and progress as able. Pt has completed 18 sessions of cardiac rehab. She continues to be motivated during class and is increasing her workload. She is currently exercising at 3.08 METs on  the treadmill. Will continue to monitor and progress as ablel.      Expected Outcomes Through exercise at home and at rehab, the patient will meet their stated goals. Through exercise at home and at rehab, the patient will meet their stated goals.  Discharge Exercise Prescription (Final Exercise Prescription Changes):  Exercise Prescription Changes - 06/05/22 1000       Response to Exercise   Blood Pressure (Admit) 110/58    Blood Pressure (Exercise) 118/60    Blood Pressure (Exit) 100/56    Heart Rate (Admit) 77 bpm    Heart Rate (Exercise) 102 bpm    Heart Rate (Exit) 86 bpm    Rating of Perceived Exertion (Exercise) 12    Duration Continue with 30 min of aerobic exercise without signs/symptoms of physical distress.    Intensity THRR unchanged      Progression   Progression Continue to progress workloads to maintain intensity without signs/symptoms of physical distress.      Resistance Training   Training Prescription Yes    Weight 5    Reps 10-15    Time 10 Minutes      Treadmill   MPH 2.3    Grade 1    Minutes 17    METs 3.08      NuStep   Level 4    SPM 87    Minutes 22    METs 2.78             Nutrition:  Target Goals: Understanding of nutrition guidelines, daily intake of sodium '1500mg'$ , cholesterol '200mg'$ , calories 30% from fat and 7% or less from saturated fats, daily to have 5 or more servings of fruits and vegetables.  Biometrics:  Pre Biometrics - 04/11/22 0950       Pre Biometrics   Height '5\' 5"'$  (1.651 m)    Weight 88.7 kg    Waist Circumference 42 inches    Hip Circumference 43 inches    Waist to Hip Ratio 0.98 %    BMI (Calculated) 32.54    Triceps Skinfold 25 mm    % Body Fat 43 %    Grip Strength 18.8 kg    Flexibility 8 in    Single Leg Stand 3.48 seconds              Nutrition Therapy Plan and Nutrition Goals:  Nutrition Therapy & Goals - 05/01/22 0904       Personal Nutrition Goals   Comments Patient  scored 3 on her diet assessment. We offer 2 educational sessions on heart healthy nutrition with handouts.      Intervention Plan   Intervention Nutrition handout(s) given to patient.    Expected Outcomes Short Term Goal: Understand basic principles of dietary content, such as calories, fat, sodium, cholesterol and nutrients.             Nutrition Assessments:  Nutrition Assessments - 04/11/22 0906       MEDFICTS Scores   Pre Score 3            MEDIFICTS Score Key: ?70 Need to make dietary changes  40-70 Heart Healthy Diet ? 40 Therapeutic Level Cholesterol Diet   Picture Your Plate Scores: <35 Unhealthy dietary pattern with much room for improvement. 41-50 Dietary pattern unlikely to meet recommendations for good health and room for improvement. 51-60 More healthful dietary pattern, with some room for improvement.  >60 Healthy dietary pattern, although there may be some specific behaviors that could be improved.    Nutrition Goals Re-Evaluation:   Nutrition Goals Discharge (Final Nutrition Goals Re-Evaluation):   Psychosocial: Target Goals: Acknowledge presence or absence of significant depression and/or stress, maximize coping skills, provide positive support system. Participant is able to verbalize types and ability to use  techniques and skills needed for reducing stress and depression.  Initial Review & Psychosocial Screening:  Initial Psych Review & Screening - 04/11/22 0902       Initial Review   Current issues with Current Depression;Current Anxiety/Panic;Current Stress Concerns    Source of Stress Concerns Family    Comments Her significant other, who she has lived with for 20 years, is now 74 and in declining health. 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      Family Dynamics   Good Support System? Yes    Comments Her support system includes her daughters and her friends.       Barriers   Psychosocial barriers to participate in program The patient should benefit from training in stress management and relaxation.      Screening Interventions   Interventions Encouraged to exercise;Provide feedback about the scores to participant    Expected Outcomes Long Term goal: The participant improves quality of Life and PHQ9 Scores as seen by post scores and/or verbalization of changes;Short Term goal: Identification and review with participant of any Quality of Life or Depression concerns found by scoring the questionnaire.;Long Term Goal: Stressors or current issues are controlled or eliminated.             Quality of Life Scores:  Quality of Life - 04/11/22 0950       Quality of Life   Select Quality of Life      Quality of Life Scores   Health/Function Pre 27.2 %    Socioeconomic Pre 30 %    Psych/Spiritual Pre 27.43 %    Family Pre 28.3 %    GLOBAL Pre 28.04 %            Scores of 19 and below usually indicate a poorer quality of life in these areas.  A difference of  2-3 points is a clinically meaningful difference.  A difference of 2-3 points in the total score of the Quality of Life Index has been associated with significant improvement in overall quality of life, self-image, physical symptoms, and general health in studies assessing change in quality of life.  PHQ-9: Review Flowsheet  More data exists      04/11/2022 09/27/2020 08/09/2020 04/20/2020 08/15/2019  Depression screen PHQ 2/9  Decreased Interest 1 0 0 0 1  Down, Depressed, Hopeless 0 0 0 0 1  PHQ - 2 Score 1 0 0 0 2  Altered sleeping 0 - 0 0 1  Tired, decreased energy 1 - 0 2 1  Change in appetite 0 - 0 0 0  Feeling bad or failure about yourself  0 - 0 0 0  Trouble concentrating 0 - 1 1 0  Moving slowly or fidgety/restless 0 - 0 0 0  Suicidal thoughts 0 - 0 0 0  PHQ-9 Score 2 - '1 3 4  '$ Difficult doing work/chores Not difficult at all - Not difficult at all Not difficult at all Not  difficult at all   Interpretation of Total Score  Total Score Depression Severity:  1-4 = Minimal depression, 5-9 = Mild depression, 10-14 = Moderate depression, 15-19 = Moderately severe depression, 20-27 = Severe depression   Psychosocial Evaluation and Intervention:  Psychosocial Evaluation - 04/11/22 0921       Psychosocial Evaluation & Interventions   Interventions Stress management education;Relaxation education;Encouraged to exercise with the program and follow exercise prescription    Comments  Pt has no barriers to participating in CR. Pt does have anxiety and depression which is treated with citalopram 40 mg daily. She reports that this treatment helps. Pt also has current stress concerns. Her significant other, Ebony Hail, is 65 years old and is in declining health. She has lived with him for the past 20 years. 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 She is hopeful that as she gets her energy back, she will be less stressed about this. She scored a 2 on her PHQ-9 and relates this to her lack of energy and lack of interest due to her lack of energy. She reports that she has a good support system from her two daughters and from her group of friends. Her goals while in the program are to improve her energy levels and to lose weight. She is eager to start the program.    Expected Outcomes Pt's depression and anxiety will continue to be medically treated, she will learn to positively cope with her stressors through provided education, and she will have no other identifiable psychosocial issues.    Continue Psychosocial Services  No Follow up required             Psychosocial Re-Evaluation:  Psychosocial Re-Evaluation     [place] Name 05/01/22 0855 05/29/22 0916           Psychosocial Re-Evaluation   Current issues with Current Stress Concerns;Current Depression;Current  Anxiety/Panic;Current Psychotropic Meds Current Stress Concerns;Current Depression;Current Anxiety/Panic;Current Psychotropic Meds      Comments Patient is new to the program completing 5 sessions. She continues to have no psychosocial barriers identified. Her depression and anxiety continue to be managed with Citalopram. She demonstrates an interest in improving her health overall and seems to enjoy the sessions. We will continue to monitor her progress. Patient is new to the program completing 15 sessions. She continues to have no psychosocial barriers identified. Her depression and anxiety continue to be managed with Citalopram. She continues to demonstrate an interest in improving her health overall and enjoys the sessions. We will continue to monitor her progress.      Expected Outcomes Patient will continue to have no psychosocial barriers identified and her depression and anxiety will continue to be managed. Patient will continue to have no psychosocial barriers identified and her depression and anxiety will continue to be managed.      Interventions Encouraged to attend Cardiac Rehabilitation for the exercise;Relaxation education;Stress management education Encouraged to attend Cardiac Rehabilitation for the exercise;Relaxation education;Stress management education      Continue Psychosocial Services  No Follow up required No Follow up required        Initial Review   Source of Stress Concerns Family Family      Comments Her significant other, who she has lived with for 20 years, is now 44 and in declining health. 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 Her significant other, who she has lived with for 20 years, is now 37 and in declining health. 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               Psychosocial Discharge (Final  Psychosocial Re-Evaluation):  Psychosocial Re-Evaluation - 05/29/22 0916       Psychosocial  Re-Evaluation   Current issues with Current Stress Concerns;Current Depression;Current Anxiety/Panic;Current Psychotropic Meds    Comments Patient is new to the program completing 15 sessions. She continues to have no psychosocial barriers identified. Her depression and anxiety continue to be managed with Citalopram. She continues to demonstrate an interest in improving her health overall and enjoys the sessions. We will continue to monitor her progress.    Expected Outcomes Patient will continue to have no psychosocial barriers identified and her depression and anxiety will continue to be managed.    Interventions Encouraged to attend Cardiac Rehabilitation for the exercise;Relaxation education;Stress management education    Continue Psychosocial Services  No Follow up required      Initial Review   Source of Stress Concerns Family    Comments Her significant other, who she has lived with for 20 years, is now 25 and in declining health. 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             Vocational Rehabilitation: Provide vocational rehab assistance to qualifying candidates.   Vocational Rehab Evaluation & Intervention:   Education: Education Goals: Education classes will be provided on a weekly basis, covering required topics. Participant will state understanding/return demonstration of topics presented.  Learning Barriers/Preferences:  Learning Barriers/Preferences - 04/11/22 0907       Learning Barriers/Preferences   Learning Barriers Sight;Hearing    Learning Preferences Skilled Demonstration             Education Topics: Hypertension, Hypertension Reduction -Define heart disease and high blood pressure. Discus how high blood pressure affects the body and ways to reduce high blood  pressure. Flowsheet Row CARDIAC REHAB PHASE II EXERCISE from 05/31/2022 in [place]  Date 04/26/22  Educator [place]  Instruction Review Code 1- Verbalizes Understanding       Exercise and Your Heart -Discuss why it is important to exercise, the FITT principles of exercise, normal and abnormal responses to exercise, and how to exercise safely. Flowsheet Row CARDIAC REHAB PHASE II EXERCISE from 05/31/2022 in [place]  Date 05/03/22  Educator [place]  Instruction Review Code 1- Verbalizes Understanding       Angina -Discuss definition of angina, causes of angina, treatment of angina, and how to decrease risk of having angina. Flowsheet Row CARDIAC REHAB PHASE II EXERCISE from 05/31/2022 in [place]  Date 05/10/22  Educator [place]  Instruction Review Code 1- Verbalizes Understanding       Cardiac Medications -Review what the following cardiac medications are used for, how they affect the body, and side effects that may occur when taking the medications.  Medications include Aspirin, Beta blockers, calcium channel blockers, ACE Inhibitors, angiotensin receptor blockers, diuretics, digoxin, and antihyperlipidemics.   Congestive Heart Failure -Discuss the definition of CHF, how to live with CHF, the signs and symptoms of CHF, and how keep track of weight and sodium intake. Flowsheet Row CARDIAC REHAB PHASE II EXERCISE from 05/31/2022 in [place]  Date 05/24/22  Educator pb  Instruction Review Code 1- Verbalizes Understanding       Heart Disease and Intimacy -Discus the effect sexual activity has on the heart, how changes occur during intimacy as we age, and safety during sexual activity. Flowsheet Row CARDIAC REHAB PHASE II EXERCISE from 05/31/2022 in [place]  Date 05/31/22  Educator pb  Instruction Review Code 1- Verbalizes Understanding  Smoking Cessation /  COPD -Discuss different methods to quit smoking, the health benefits of quitting smoking, and the definition of COPD.   Nutrition I: Fats -Discuss the types of cholesterol, what cholesterol does to the heart, and how cholesterol levels can be controlled.   Nutrition II: Labels -Discuss the different components of food labels and how to read food label   Heart Parts/Heart Disease and PAD -Discuss the anatomy of the heart, the pathway of blood circulation through the heart, and these are affected by heart disease.   Stress I: Signs and Symptoms -Discuss the causes of stress, how stress may lead to anxiety and depression, and ways to limit stress.   Stress II: Relaxation -Discuss different types of relaxation techniques to limit stress. Flowsheet Row CARDIAC REHAB PHASE II EXERCISE from 05/31/2022 in [place]  Date 04/19/22  Educator [place]  Instruction Review Code 1- Verbalizes Understanding       Warning Signs of Stroke / TIA -Discuss definition of a stroke, what the signs and symptoms are of a stroke, and how to identify when someone is having stroke.   Knowledge Questionnaire Score:  Knowledge Questionnaire Score - 04/11/22 0908       Knowledge Questionnaire Score   Pre Score 15/24             Core Components/Risk Factors/Patient Goals at Admission:  Personal Goals and Risk Factors at Admission - 04/11/22 0914       Core Components/Risk Factors/Patient Goals on Admission    Weight Management Yes;Obesity;Weight Loss    Intervention Weight Management: Develop a combined nutrition and exercise program designed to reach desired caloric intake, while maintaining appropriate intake of nutrient and fiber, sodium and fats, and appropriate energy expenditure required for the weight goal.;Weight Management: Provide education and appropriate resources to help participant work on and attain dietary goals.;Weight Management/Obesity: Establish reasonable  short term and long term weight goals.;Obesity: Provide education and appropriate resources to help participant work on and attain dietary goals.    Expected Outcomes Short Term: Continue to assess and modify interventions until short term weight is achieved;Long Term: Adherence to nutrition and physical activity/exercise program aimed toward attainment of established weight goal;Weight Maintenance: Understanding of the daily nutrition guidelines, which includes 25-35% calories from fat, 7% or less cal from saturated fats, less than '200mg'$  cholesterol, less than 1.5gm of sodium, & 5 or more servings of fruits and vegetables daily;Weight Loss: Understanding of general recommendations for a balanced deficit meal plan, which promotes 1-2 lb weight loss per week and includes a negative energy balance of [phone] kcal/d;Understanding recommendations for meals to include 15-35% energy as protein, 25-35% energy from fat, 35-60% energy from carbohydrates, less than '200mg'$  of dietary cholesterol, 20-35 gm of total fiber daily;Understanding of distribution of calorie intake throughout the day with the consumption of 4-5 meals/snacks    Diabetes Yes    Intervention Provide education about signs/symptoms and action to take for hypo/hyperglycemia.;Provide education about proper nutrition, including hydration, and aerobic/resistive exercise prescription along with prescribed medications to achieve blood glucose in normal ranges: Fasting glucose 65-99 mg/dL    Expected Outcomes Short Term: Participant verbalizes understanding of the signs/symptoms and immediate care of hyper/hypoglycemia, proper foot care and importance of medication, aerobic/resistive exercise and nutrition plan for blood glucose control.;Long Term: Attainment of HbA1C < 7%.    Hypertension Yes    Intervention Provide education on lifestyle modifcations including regular physical activity/exercise, weight management, moderate sodium restriction and increased  consumption  of fresh fruit, vegetables, and low fat dairy, alcohol moderation, and smoking cessation.;Monitor prescription use compliance.    Expected Outcomes Short Term: Continued assessment and intervention until BP is < 140/38m HG in hypertensive participants. < 130/857mHG in hypertensive participants with diabetes, heart failure or chronic kidney disease.;Long Term: Maintenance of blood pressure at goal levels.    Lipids Yes    Intervention Provide education and support for participant on nutrition & aerobic/resistive exercise along with prescribed medications to achieve LDL '70mg'$ , HDL >'40mg'$ .    Expected Outcomes Short Term: Participant states understanding of desired cholesterol values and is compliant with medications prescribed. Participant is following exercise prescription and nutrition guidelines.;Long Term: Cholesterol controlled with medications as prescribed, with individualized exercise RX and with personalized nutrition plan. Value goals: LDL < '70mg'$ , HDL > 40 mg.    Personal Goal Other Yes    Personal Goal Improve energy levels.    Intervention Attend CR three days per week and begin a home exercise program.    Expected Outcomes Pt will meet stated goal.             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 She does have multipe risk factors for CAD and is participating in the program for risk modification. She is new to the program completing 5 sessions. Her current weight is 195.2 lbs unchanged from her initial weight. Her blood pressure is well controlled. Her last A1C was 02/02/22 at 8.6% which is trending down. She is on Metformin and  Ozempic for DM controll. Her personal goals for the program are to improve her energy levels and lose weight. We will continue to monitor her progress as she works towards meeting these personal goals. Patient has completed 15 sessions. Her current weight is 195.0 lbs unchanged from last 30 day review. She is doing well in the program with consistent attendance and progressions. Her blood pressure continues to be well controlled. Her last A1C was 02/02/22 at 8.6% which is trending down. She is on Metformin, Insulin and Ozempic for DM control. Her personal goals for the program are to improve her energy levels and lose weight. We will continue to monitor her progress as she works towards meeting these personal goals.      Expected Outcomes Patient will complete the program meeting both perosnal and program goals. Patient will complete the program meeting both perosnal and program goals.               Core Components/Risk Factors/Patient Goals at Discharge (Final Review):   Goals and Risk Factor Review - 05/29/22 0917       Core Components/Risk Factors/Patient Goals Review   Personal Goals Review Weight Management/Obesity;Hypertension;Lipids;Diabetes;Other    Review Patient has completed 15 sessions. Her current weight is 195.0 lbs unchanged from last 30 day review. She is doing well in the program with consistent attendance and progressions. Her blood pressure continues to be well controlled. Her last A1C was 02/02/22 at 8.6% which is trending down. She is on Metformin, Insulin and Ozempic for DM control. Her personal goals for the program are to improve her energy levels and lose weight. We will continue to monitor her progress as  she works towards meeting these personal goals.    Expected Outcomes Patient will complete the program meeting both perosnal and program goals.             ITP Comments:   Comments: ITP REVIEW Pt is making expected progress toward Cardiac Rehab goals after  completing 18 sessions. Recommend continued exercise, life style modification, education, and increased stamina and strength.

## 2022-06-07 NOTE — Progress Notes (Signed)
Daily Session Note  Patient Details  Name: Sarah Pope MRN: [id number] Date of Birth: [birth year] Referring Provider:   Flowsheet Row CARDIAC REHAB PHASE II ORIENTATION from 04/11/2022 in [place]  Referring Provider Dr. Cyndia Bent       Encounter Date: 06/07/2022  Check In:  Session Check In - 06/07/22 0930       Check-In   Supervising physician immediately available to respond to emergencies [facility] MD immediately available    Physician(s) Dr. Johnsie Cancel    Location [facility]    Staff Present Redge Gainer, BS, Exercise Physiologist;Dalton Kris Mouton, MS, ACSM-CEP, Exercise Physiologist    Virtual Visit No    Medication changes reported     No    Fall or balance concerns reported    Yes    Comments She has not fallen, but reports that she frequently loses her balance but catches herself. She also frequently gets dizzy and lightheaded due to her medications.    Tobacco Cessation No Change    Warm-up and Cool-down Performed as group-led instruction    Resistance Training Performed Yes    VAD Patient? No    PAD/SET Patient? No      Pain Assessment   Currently in Pain? No/denies    Multiple Pain Sites No             Capillary Blood Glucose: No results found for this or any previous visit (from the past 24 hour(s)).    Social History   Tobacco Use  Smoking Status Former   Packs/day: 0.50   Years: 5.00   Total pack years: 2.50   Types: Cigarettes   Quit date: 10/23/1982   Years since quitting: 39.6  Smokeless Tobacco Never    Goals Met:  Independence with exercise equipment Exercise tolerated well No report of concerns or symptoms today Strength training completed today  Goals Unmet:  Not Applicable  Comments: check out 1030   Dr. Carlyle Dolly is Medical Director for [facility]

## 2022-06-08 ENCOUNTER — Encounter: Payer: Self-pay | Admitting: Nurse Practitioner

## 2022-06-08 ENCOUNTER — Ambulatory Visit (INDEPENDENT_AMBULATORY_CARE_PROVIDER_SITE_OTHER): Payer: HMO | Admitting: Nurse Practitioner

## 2022-06-08 VITALS — BP 111/69 | HR 92 | Ht 65.0 in | Wt 193.0 lb

## 2022-06-08 DIAGNOSIS — E039 Hypothyroidism, unspecified: Secondary | ICD-10-CM | POA: Diagnosis not present

## 2022-06-08 DIAGNOSIS — Z794 Long term (current) use of insulin: Secondary | ICD-10-CM | POA: Diagnosis not present

## 2022-06-08 DIAGNOSIS — E119 Type 2 diabetes mellitus without complications: Secondary | ICD-10-CM

## 2022-06-08 DIAGNOSIS — I10 Essential (primary) hypertension: Secondary | ICD-10-CM | POA: Diagnosis not present

## 2022-06-08 DIAGNOSIS — E782 Mixed hyperlipidemia: Secondary | ICD-10-CM

## 2022-06-08 LAB — POCT GLYCOSYLATED HEMOGLOBIN (HGB A1C): HbA1c POC (<> result, manual entry): 7.7 % (ref 4.0–5.6)

## 2022-06-08 MED ORDER — INSULIN LISPRO (1 UNIT DIAL) 100 UNIT/ML (KWIKPEN): 5.0000 [IU] | PEN_INJECTOR | Freq: Three times a day (TID) | SUBCUTANEOUS | 3 refills | Status: DC

## 2022-06-08 MED ORDER — SEMAGLUTIDE (1 MG/DOSE) 4 MG/3ML ~~LOC~~ SOPN
1.0000 mg | PEN_INJECTOR | SUBCUTANEOUS | 0 refills | Status: DC
  Filled 2022-11-17: qty 3, 28d supply, fill #0

## 2022-06-08 NOTE — Progress Notes (Signed)
Endocrinology Follow Up Note       06/08/2022, 10:18 AM   Subjective:    Patient ID: Sarah Pope, female    DOB: [birth year].  Sarah Pope is being seen in follow up after being seen in consultation for management of currently uncontrolled symptomatic diabetes requested by  Hudson Nation, MD.   Past Medical History:  Diagnosis Date   Acute respiratory failure with hypercapnia ([place])    Allergy    Anxiety    Aortic stenosis    a. S/p AVR on 02/06/2022 using a 21 mm Edwards Inspiris Resilina pericardial valve   Asthma    Bronchitis with bronchospasm    Cancer ([place])    skin   Depression    DM type 2 (diabetes mellitus, type 2) ([place])    FATTY LIVER DISEASE 08/05/2008   GERD (gastroesophageal reflux disease)    Hyperlipidemia    Hypertension    Hypothyroidism    Kidney stones    Leukocytosis    Obesity, Class III, BMI 40-49.9 (morbid obesity) ([place])    PERSONAL HX COLONIC POLYPS 09/01/2008   PONV (postoperative nausea and vomiting)    Seasonal allergies    TRANSAMINASES, SERUM, ELEVATED 08/05/2008    Past Surgical History:  Procedure Laterality Date   AORTIC VALVE REPLACEMENT N/A 02/06/2022   Procedure: AORTIC VALVE REPLACEMENT (AVR) WITH 21 MM INSIPIRIS AORTIC VALVE.;  Surgeon: Gaye Pollack, MD;  Location: [facility];  Service: Open Heart Surgery;  Laterality: N/A;   COLONOSCOPY  2013   Repeat 10 years   [place]   realignment   NOSE SURGERY  2007   sinus infections   POLYPECTOMY     RIGHT HEART CATH AND CORONARY ANGIOGRAPHY N/A 12/01/2021   Procedure: RIGHT HEART CATH AND CORONARY ANGIOGRAPHY;  Surgeon: Burnell Blanks, MD;  Location: [facility];  Service: Cardiovascular;  Laterality: N/A;   skin cancer removal     TEE WITHOUT CARDIOVERSION N/A 02/06/2022   Procedure: TRANSESOPHAGEAL ECHOCARDIOGRAM (TEE);  Surgeon: Gaye Pollack, MD;  Location: [facility];  Service:  Open Heart Surgery;  Laterality: N/A;   TONSILLECTOMY      Social History   Socioeconomic History   Marital status: Divorced    Spouse name: Not on file   Number of children: Not on file   Years of education: Not on file   Highest education level: Not on file  Occupational History   Occupation: Lead Clerk at [employer] Use   Smoking status: Former    Packs/day: 0.50    Years: 5.00    Total pack years: 2.50    Types: Cigarettes    Quit date: 10/23/1982    Years since quitting: 39.6   Smokeless tobacco: Never  Vaping Use   Vaping Use: Never used  Substance and Sexual Activity   Alcohol use: No   Drug use: No   Sexual activity: Not on file  Other Topics Concern   Not on file  Social History Narrative   Not on file   Social Determinants of Health   Financial Resource Strain: Not on file  Food Insecurity: Not on file  Transportation Needs: Not on file  Physical Activity: Not on file  Stress: Not on file  Social Connections: Not on file    Family History  Problem Relation Age of Onset   Cancer Mother    Hypertension Mother    Thyroid disease Mother    Breast cancer Mother    Diabetes Mother    Prostate cancer Father    Heart attack Maternal Grandmother    Allergies Daughter    Colon cancer Neg Hx    Esophageal cancer Neg Hx    Rectal cancer Neg Hx    Stomach cancer Neg Hx    Colon polyps Neg Hx     Outpatient Encounter Medications as of 06/08/2022  Medication Sig   insulin lispro (HUMALOG KWIKPEN) 100 UNIT/ML KwikPen Inject 5-11 Units into the skin 3 (three) times daily.   Semaglutide, 1 MG/DOSE, 4 MG/3ML SOPN Inject 1 mg as directed once a week.   acetaminophen (TYLENOL) 325 MG tablet Take 2 tablets (650 mg total) by mouth every 6 (six) hours as needed for mild pain.   albuterol (VENTOLIN HFA) 108 (90 Base) MCG/ACT inhaler Inhale 2 puffs into the lungs every 6 (six) hours as needed for shortness of breath.   amoxicillin (AMOXIL) 500 MG capsule  Take 500 mg by mouth 3 (three) times daily.   aspirin EC 81 MG tablet Take 81 mg by mouth daily. Swallow whole.   atorvastatin (LIPITOR) 20 MG tablet Take 1 tablet (20 mg total) by mouth daily.   azelastine (ASTELIN) 0.1 % nasal spray Place 1 spray into both nostrils 2 (two) times daily as needed for rhinitis or allergies. Use in each nostril as directed   cetirizine (ZYRTEC) 10 MG tablet Take 10 mg by mouth daily as needed for allergies.   ciclopirox (PENLAC) 8 % solution Apply topically at bedtime. Apply over nail and surrounding skin. Apply daily over previous coat. After seven (7) days, may remove with alcohol and continue cycle. (Patient taking differently: Apply 1 Application topically 3 (three) times a week. Apply over nail and surrounding skin. Apply daily over previous coat. After seven (7) days, may remove with alcohol and continue cycle.)   citalopram (CELEXA) 40 MG tablet Take 1 tablet (40 mg total) by mouth daily.   Continuous Blood Gluc Receiver (FREESTYLE LIBRE 14 DAY READER) DEVI USE AS DIRECTED THREE TIMES DAILY.   Continuous Blood Gluc Sensor (FREESTYLE LIBRE 14 DAY SENSOR) MISC USE TO CHECK BLOOD SUGAR THREE TIMES DAILY WITH SLIDING SCALE INSULIN USE.   diclofenac (VOLTAREN) 75 MG EC tablet TAKE ONE TABLET BY MOUTH TWICE daily PRN pain. TAKE WITH FOOD. (Patient taking differently: Take 75 mg by mouth See admin instructions. Take 1 tablet (75 mg) by mouth (scheduled) every morning, may take an additional dose if needed for pain.)   Fluticasone-Salmeterol (ADVAIR DISKUS) 250-50 MCG/DOSE AEPB Inhale 1 puff into the lungs 2 (two) times daily. (Patient taking differently: Inhale 1 puff into the lungs 2 (two) times daily as needed (respiratory issues.).)   insulin glargine (LANTUS) 100 UNIT/ML Solostar Pen Inject 80 Units into the skin at bedtime.   levothyroxine (SYNTHROID) 175 MCG tablet Take 1 tablet (175 mcg total) by mouth daily.   losartan (COZAAR) 25 MG tablet Take 1 tablet (25 mg  total) by mouth daily.   metFORMIN (GLUCOPHAGE) 1000 MG tablet Take 1 tablet (1,000 mg total) by mouth 2 (two) times daily with a meal. (Patient taking differently: Take 1,000 mg by mouth as needed.)  metoprolol tartrate (LOPRESSOR) 25 MG tablet Take 1 tablet (25 mg total) by mouth 2 (two) times daily.   omeprazole (PRILOSEC) 40 MG capsule Take 1 capsule (40 mg total) by mouth daily.   ondansetron (ZOFRAN) 4 MG tablet Take 1 tablet (4 mg total) by mouth every 8 (eight) hours as needed for nausea or vomiting.   potassium chloride SA (KLOR-CON M) 20 MEQ tablet Take 1 tablet (20 mEq total) by mouth daily.   traMADol (ULTRAM) 50 MG tablet Take 1 tablet (50 mg total) by mouth every 6 (six) hours as needed.   [DISCONTINUED] insulin lispro (HUMALOG) 100 UNIT/ML injection Inject 0.12-0.18 mLs (12-18 Units total) into the skin 3 (three) times daily with meals. Sliding scale if needed   [DISCONTINUED] Semaglutide,0.25 or 0.'5MG'$ /DOS, (OZEMPIC, 0.25 OR 0.5 MG/DOSE,) 2 MG/1.5ML SOPN Inject 0.5 mg into the skin once a week. (Patient taking differently: Inject 0.5 mg into the skin every Thursday.)   No facility-administered encounter medications on file as of 06/08/2022.    ALLERGIES: Allergies  Allergen Reactions   Codeine Nausea Only   Sulfa Antibiotics Itching    VACCINATION STATUS: Immunization History  Administered Date(s) Administered   Influenza Split 07/03/2012, 09/10/2013   Influenza,inj,Quad PF,6+ Mos 09/29/2015, 06/27/2016, 08/15/2017, 08/28/2018, 07/14/2019, 08/09/2020   Influenza-Unspecified 08/24/2011, 08/11/2020   Moderna Sars-Covid-2 Vaccination 04/05/2020, 04/28/2020   PNEUMOCOCCAL CONJUGATE-20 02/11/2022   Pneumococcal Polysaccharide-23 08/23/2004, 08/28/2018   Td 06/17/2008   Tdap 05/09/2019   Zoster Recombinat (Shingrix) 10/14/2019, 08/04/2020, 08/11/2020    Diabetes She presents for her follow-up diabetic visit. She has type 2 diabetes mellitus. Onset time: Diagnosed at approx  age of 35. Her disease course has been improving. There are no hypoglycemic associated symptoms. Associated symptoms include fatigue, foot paresthesias and weight loss. Pertinent negatives for diabetes include no polydipsia and no polyuria. There are no hypoglycemic complications. Symptoms are stable. Diabetic complications include nephropathy and peripheral neuropathy. Risk factors for coronary artery disease include stress, diabetes mellitus, dyslipidemia, family history, obesity, hypertension, post-menopausal and sedentary lifestyle. Current diabetic treatment includes intensive insulin program and oral agent (monotherapy). She is compliant with treatment some of the time (has not been taking her humalog regularly due to good readings). Her weight is fluctuating minimally. She is following a generally unhealthy diet. When asked about meal planning, she reported none. She has not had a previous visit with a dietitian. She rarely participates in exercise. (She presents today with her CGM showing wide gaps in glucose readings with above target readings.  She has only been scanning her CGM on average once per day.  Her POCT A1c today is 7.7%, improving from last visit of 8.6%.  She denies any hypoglycemia.  She states she has not been taking the humalog regularly as her glucose has been "good."  Analysis of her CGM shows TIR 57%, TAR 43%, TBR 0% with active time only 16%.) An ACE inhibitor/angiotensin II receptor blocker is being taken. She sees a podiatrist.Eye exam is not current.  Hyperlipidemia This is a chronic problem. The current episode started more than 1 year ago. The problem is uncontrolled. Recent lipid tests were reviewed and are variable. Exacerbating diseases include chronic renal disease, diabetes, hypothyroidism and obesity. Factors aggravating her hyperlipidemia include thiazides and fatty foods. Current antihyperlipidemic treatment includes statins. The current treatment provides moderate  improvement of lipids. Compliance problems include adherence to diet and adherence to exercise.  Risk factors for coronary artery disease include diabetes mellitus, dyslipidemia, family history, hypertension, obesity, post-menopausal, a  sedentary lifestyle and stress.  Hypertension This is a chronic problem. The current episode started more than 1 year ago. The problem has been resolved since onset. The problem is controlled. Agents associated with hypertension include thyroid hormones. Risk factors for coronary artery disease include diabetes mellitus, dyslipidemia, family history, post-menopausal state, sedentary lifestyle, stress and obesity. Past treatments include angiotensin blockers and diuretics. The current treatment provides significant improvement. Compliance problems include exercise and diet.  Hypertensive end-organ damage includes kidney disease. Identifiable causes of hypertension include chronic renal disease and sleep apnea.     Review of systems  Constitutional: + stable body weight, current Body mass index is 32.12 kg/m., + fatigue, no subjective hyperthermia, no subjective hypothermia, decreased appetite Eyes: no blurry vision, no xerophthalmia ENT: no sore throat, no nodules palpated in throat, no dysphagia/odynophagia, no hoarseness Cardiovascular: no chest pain, no shortness of breath, no palpitations, + intermittent leg swelling Respiratory: no cough, no shortness of breath Gastrointestinal: no nausea/vomiting/diarrhea Musculoskeletal: no muscle/joint aches Skin: no rashes, no hyperemia Neurological: + resting tremors, + numbness/tingling/burning pain to bilateral feet, no dizziness Psychiatric: no depression, no anxiety  Objective:     BP 111/69   Pulse 92   Ht '5\' 5"'$  (1.651 m)   Wt 193 lb (87.5 kg)   BMI 32.12 kg/m   Wt Readings from Last 3 Encounters:  06/08/22 193 lb (87.5 kg)  06/05/22 194 lb 7.1 oz (88.2 kg)  05/22/22 198 lb 3.1 oz (89.9 kg)     BP  Readings from Last 3 Encounters:  06/08/22 111/69  04/11/22 96/64  04/07/22 100/64     Physical Exam- Limited  Constitutional:  Body mass index is 32.12 kg/m. , not in acute distress, normal state of mind Eyes:  EOMI, no exophthalmos Neck: Supple Cardiovascular: RRR, + murmur, rubs, or gallops, nonpitting edema to bilateral feet Respiratory: Adequate breathing efforts, no crackles, rales, rhonchi, or wheezing Musculoskeletal: no gross deformities, strength intact in all four extremities, no gross restriction of joint movements Skin:  no rashes, no hyperemia Neurological: mild resting tremor   Diabetic Foot Exam - Simple   Simple Foot Form Diabetic Foot exam was performed with the following findings: Yes 06/08/2022 10:16 AM  Visual Inspection See comments: Yes Sensation Testing Intact to touch and monofilament testing bilaterally: Yes Pulse Check Posterior Tibialis and Dorsalis pulse intact bilaterally: Yes Comments Has open are to right great and second toe (says she bumped it a few days ago).  Onychomycosis bilaterally,      CMP ( most recent) CMP     Component Value Date/Time   NA 137 02/09/2022 0245   NA 141 04/18/2021 0943   K 4.3 02/09/2022 0245   CL 101 02/09/2022 0245   CO2 31 02/09/2022 0245   GLUCOSE 109 (H) 02/09/2022 0245   BUN 13 02/09/2022 0245   BUN 14 04/18/2021 0943   CREATININE 0.74 02/09/2022 0245   CREATININE 0.64 02/05/2013 1148   CALCIUM 8.2 (L) 02/09/2022 0245   PROT 6.6 02/02/2022 1450   PROT 6.8 04/18/2021 0943   ALBUMIN 3.7 02/02/2022 1450   ALBUMIN 4.4 04/18/2021 0943   AST 20 02/02/2022 1450   ALT 17 02/02/2022 1450   ALKPHOS 69 02/02/2022 1450   BILITOT 1.2 02/02/2022 1450   BILITOT 0.8 04/18/2021 0943   GFRNONAA >60 02/09/2022 0245   GFRAA >60 05/31/2020 0930     Diabetic Labs (most recent): Lab Results  Component Value Date   HGBA1C 7.7 06/08/2022   HGBA1C 8.6 (  H) 02/02/2022   HGBA1C 9.0 (H) 10/25/2021   MICROALBUR 1.20  02/05/2013     Lipid Panel ( most recent) Lipid Panel     Component Value Date/Time   CHOL 165 04/18/2021 0943   TRIG 159 (H) 04/18/2021 0943   HDL 45 04/18/2021 0943   CHOLHDL 3.7 04/18/2021 0943   CHOLHDL 3.4 12/16/2019 0905   VLDL 22 12/16/2019 0905   LDLCALC 92 04/18/2021 0943   LABVLDL 28 04/18/2021 0943      Lab Results  Component Value Date   TSH 5.150 (H) 04/18/2021   TSH 1.503 12/16/2019   TSH 1.360 07/01/2019   TSH 2.480 04/01/2018   TSH 2.650 10/03/2016   TSH 3.620 10/06/2015   TSH 4.248 02/05/2013   TSH 3.189 06/30/2012           Assessment & Plan:   1) Type 2 diabetes mellitus without complication, with long-term current use of insulin ([place])  She presents today with her CGM showing wide gaps in glucose readings with above target readings.  She has only been scanning her CGM on average once per day.  Her POCT A1c today is 7.7%, improving from last visit of 8.6%.  She denies any hypoglycemia.  She states she has not been taking the humalog regularly as her glucose has been "good."  Analysis of her CGM shows TIR 57%, TAR 43%, TBR 0% with active time only 16%.  - Viki H Capasso has currently uncontrolled symptomatic type 2 DM since 66 years of age.  -Recent labs reviewed.  - I had a long discussion with her about the progressive nature of diabetes and the pathology behind its complications. -her diabetes is complicated by mild CKD, neuropathy and she remains at a high risk for more acute and chronic complications which include CAD, CVA, CKD, retinopathy, and neuropathy. These are all discussed in detail with her.  - Nutritional counseling repeated at each appointment due to patients tendency to fall back in to old habits.  - The patient admits there is a room for improvement in their diet and drink choices. -  Suggestion is made for the patient to avoid simple carbohydrates from their diet including Cakes, Sweet Desserts / Pastries, Ice Cream, Soda (diet  and regular), Sweet Tea, Candies, Chips, Cookies, Sweet Pastries, Store Bought Juices, Alcohol in Excess of 1-2 drinks a day, Artificial Sweeteners, Coffee Creamer, and "Sugar-free" Products. This will help patient to have stable blood glucose profile and potentially avoid unintended weight gain.   - I encouraged the patient to switch to unprocessed or minimally processed complex starch and increased protein intake (animal or plant source), fruits, and vegetables.   - Patient is advised to stick to a routine mealtimes to eat 3 meals a day and avoid unnecessary snacks (to snack only to correct hypoglycemia).  - I have approached her with the following individualized plan to manage her diabetes and patient agrees:   -She is advised to continue Lantus 80 units SQ nightly, lower her Humalog 5-11 (in hopes she will be more consistent with taking it) units TID with meals if glucose is above 90 and she is eating (Specific instructions on how to titrate insulin dosage based on glucose readings given to patient in writing), and Metformin 1000 mg po twice daily with meals.  Will increase her Ozempic to 1 mg SQ weekly.  -she is encouraged to start consistently using her CGM to monitor glucose 4 times daily, before meals and before bed, and to call  the clinic if she has readings less than 70 or above 300 for 3 tests in a row.  She promises to do better with this moving forward.  - she is warned not to take insulin without proper monitoring per orders. - Adjustment parameters are given to her for hypo and hyperglycemia in writing.  -She has allergy to sulfa meds.  - Specific targets for  A1c; LDL, HDL, and Triglycerides were discussed with the patient.  2) Blood Pressure /Hypertension:  her blood pressure is controlled to target.   she is advised to continue her current medications including Metoprolol 25 po daily and Losartan 25 mg po daily.  She has follow up with her cardiologist today.  3)  Lipids/Hyperlipidemia:    Review of her recent lipid panel from 04/18/21 showed controlled LDL at 92 and slightly elevated triglycerides of 159 .  she is advised to continue Lipitor 20 mg daily at bedtime.  Side effects and precautions discussed with her.  She has annual exam with PCP coming up.  I requested she send copy of labs here for our records.  4)  Weight/Diet:  her Body mass index is 32.12 kg/m.  -  clearly complicating her diabetes care.   she is a candidate for weight loss. I discussed with her the fact that loss of 5 - 10% of her  current body weight will have the most impact on her diabetes management.  Exercise, and detailed carbohydrates information provided  -  detailed on discharge instructions.  5) Chronic Care/Health Maintenance: -she is on ACEI/ARB and Statin medications and is encouraged to initiate and continue to follow up with Ophthalmology, Dentist, Podiatrist at least yearly or according to recommendations, and advised to stay away from smoking. I have recommended yearly flu vaccine and pneumonia vaccine at least every 5 years; moderate intensity exercise for up to 150 minutes weekly; and sleep for at least 7 hours a day.  - she is advised to maintain close follow up with [place] Nation, MD for primary care needs, as well as her other providers for optimal and coordinated care.      I spent 41 minutes in the care of the patient today including review of labs from [place], Lipids, Thyroid Function, Hematology (current and previous including abstractions from other facilities); face-to-face time discussing  her blood glucose readings/logs, discussing hypoglycemia and hyperglycemia episodes and symptoms, medications doses, her options of short and long term treatment based on the latest standards of care / guidelines;  discussion about incorporating lifestyle medicine;  and documenting the encounter. Risk reduction counseling performed per USPSTF guidelines to reduce  obesity  and cardiovascular risk factors.     Please refer to Patient Instructions for Blood Glucose Monitoring and Insulin/Medications Dosing Guide"  in media tab for additional information. Please  also refer to " Patient Self Inventory" in the Media  tab for reviewed elements of pertinent patient history.  [place] participated in the discussions, expressed understanding, and voiced agreement with the above plans.  All questions were answered to her satisfaction. she is encouraged to contact clinic should she have any questions or concerns prior to her return visit.     Follow up plan: - Return in about 3 months (around 09/08/2022) for Diabetes F/U with A1c in office, No previsit labs, Bring meter and logs.    Rayetta Pigg, [facility] [address] [place], [place] [address] Phone: [phone] Fax: [phone]  06/08/2022, 10:18 AM

## 2022-06-09 ENCOUNTER — Encounter (HOSPITAL_COMMUNITY): Payer: HMO

## 2022-06-12 ENCOUNTER — Encounter (HOSPITAL_COMMUNITY): Payer: HMO

## 2022-06-14 ENCOUNTER — Encounter (HOSPITAL_COMMUNITY)
Admission: RE | Admit: 2022-06-14 | Discharge: 2022-06-14 | Disposition: A | Discharge status: Home / Self Care | Payer: HMO | Source: Ambulatory Visit | Attending: Cardiology | Admitting: Cardiology

## 2022-06-14 DIAGNOSIS — Z952 Presence of prosthetic heart valve: Secondary | ICD-10-CM

## 2022-06-14 NOTE — Progress Notes (Signed)
Daily Session Note  Patient Details  Name: Sarah Pope MRN: [id number] Date of Birth: August 06, 1956 Referring Provider:   Flowsheet Row CARDIAC REHAB PHASE II ORIENTATION from 04/11/2022 in [facility]  Referring Provider Dr. Cyndia Bent       Encounter Date: 06/14/2022  Check In:  Session Check In - 06/14/22 0925       Check-In   Supervising physician immediately available to respond to emergencies [facility] MD immediately available    Physician(s) Dr. Audie Box    Location [facility]    Staff Present Redge Gainer, BS, Exercise Physiologist;Brandey Vandalen Kris Mouton, MS, ACSM-CEP, Exercise Physiologist    Virtual Visit No    Medication changes reported     No    Fall or balance concerns reported    Yes    Comments She has not fallen, but reports that she frequently loses her balance but catches herself. She also frequently gets dizzy and lightheaded due to her medications.    Tobacco Cessation No Change    Warm-up and Cool-down Performed as group-led instruction    Resistance Training Performed Yes    VAD Patient? No    PAD/SET Patient? No      Pain Assessment   Currently in Pain? No/denies    Multiple Pain Sites No             Capillary Blood Glucose: No results found for this or any previous visit (from the past 24 hour(s)).    Social History   Tobacco Use  Smoking Status Former   Packs/day: 0.50   Years: 5.00   Total pack years: 2.50   Types: Cigarettes   Quit date: 10/23/1982   Years since quitting: 39.6  Smokeless Tobacco Never    Goals Met:  Independence with exercise equipment Exercise tolerated well No report of concerns or symptoms today Strength training completed today  Goals Unmet:  Not Applicable  Comments: checkout time is 1030   Dr. Carlyle Dolly is Medical Director for [place]

## 2022-06-16 ENCOUNTER — Encounter (HOSPITAL_COMMUNITY)
Admission: RE | Admit: 2022-06-16 | Discharge: 2022-06-16 | Disposition: A | Discharge status: Home / Self Care | Payer: HMO | Source: Ambulatory Visit | Attending: Cardiology | Admitting: Cardiology

## 2022-06-16 DIAGNOSIS — Z952 Presence of prosthetic heart valve: Secondary | ICD-10-CM

## 2022-06-16 NOTE — Progress Notes (Signed)
Daily Session Note  Patient Details  Name: Sarah Pope MRN: [id number] Date of Birth: 05-10-1956 Referring Provider:   Flowsheet Row CARDIAC REHAB PHASE II ORIENTATION from 04/11/2022 in [facility]  Referring Provider Dr. Cyndia Bent       Encounter Date: 06/16/2022  Check In:  Session Check In - 06/16/22 0915       Check-In   Supervising physician immediately available to respond to emergencies [facility] MD immediately available    Physician(s) Dr. Phineas Inches    Location [facility]    Staff Present Redge Gainer, BS, Exercise Physiologist;Daphyne Hassell Done, RN, BSN    Virtual Visit No    Medication changes reported     No    Fall or balance concerns reported    Yes    Comments She has not fallen, but reports that she frequently loses her balance but catches herself. She also frequently gets dizzy and lightheaded due to her medications.    Tobacco Cessation No Change    Warm-up and Cool-down Performed as group-led instruction    Resistance Training Performed Yes    VAD Patient? No    PAD/SET Patient? No      Pain Assessment   Currently in Pain? No/denies    Multiple Pain Sites No             Capillary Blood Glucose: No results found for this or any previous visit (from the past 24 hour(s)).    Social History   Tobacco Use  Smoking Status Former   Packs/day: 0.50   Years: 5.00   Total pack years: 2.50   Types: Cigarettes   Quit date: 10/23/1982   Years since quitting: 39.6  Smokeless Tobacco Never    Goals Met:  Independence with exercise equipment Exercise tolerated well No report of concerns or symptoms today Strength training completed today  Goals Unmet:  Not Applicable  Comments: check out 1030   Dr. Carlyle Dolly is Medical Director for [place]

## 2022-06-19 ENCOUNTER — Encounter (HOSPITAL_COMMUNITY)
Admission: RE | Admit: 2022-06-19 | Discharge: 2022-06-19 | Disposition: A | Discharge status: Home / Self Care | Payer: HMO | Source: Ambulatory Visit | Attending: Cardiology | Admitting: Cardiology

## 2022-06-19 VITALS — Wt 196.9 lb

## 2022-06-19 DIAGNOSIS — Z952 Presence of prosthetic heart valve: Secondary | ICD-10-CM | POA: Diagnosis not present

## 2022-06-19 NOTE — Progress Notes (Signed)
Daily Session Note  Patient Details  Name: SHAUNTA ONCALE MRN: [id number] Date of Birth: April 22, 1956 Referring Provider:   Flowsheet Row CARDIAC REHAB PHASE II ORIENTATION from 04/11/2022 in [place]  Referring Provider Dr. Cyndia Bent       Encounter Date: 06/19/2022  Check In:  Session Check In - 06/19/22 0930       Check-In   Supervising physician immediately available to respond to emergencies [facility] MD immediately available    Physician(s) Dr. Marlou Porch    Location [facility]    Staff Present Redge Gainer, BS, Exercise Physiologist;Daphyne Hassell Done, RN, Jennye Moccasin, RN, BSN    Virtual Visit No    Medication changes reported     No    Fall or balance concerns reported    Yes    Comments She has not fallen, but reports that she frequently loses her balance but catches herself. She also frequently gets dizzy and lightheaded due to her medications.    Tobacco Cessation No Change    Warm-up and Cool-down Performed as group-led instruction    Resistance Training Performed Yes    VAD Patient? No    PAD/SET Patient? No      Pain Assessment   Currently in Pain? No/denies    Multiple Pain Sites No             Capillary Blood Glucose: No results found for this or any previous visit (from the past 24 hour(s)).    Social History   Tobacco Use  Smoking Status Former   Packs/day: 0.50   Years: 5.00   Total pack years: 2.50   Types: Cigarettes   Quit date: 10/23/1982   Years since quitting: 39.6  Smokeless Tobacco Never    Goals Met:  Independence with exercise equipment Exercise tolerated well No report of concerns or symptoms today Strength training completed today  Goals Unmet:  Not Applicable  Comments: Check out 1030.   Dr. Carlyle Dolly is Medical Director for [facility]

## 2022-06-21 ENCOUNTER — Encounter (HOSPITAL_COMMUNITY): Payer: HMO

## 2022-06-23 ENCOUNTER — Encounter (HOSPITAL_COMMUNITY): Payer: HMO

## 2022-06-26 ENCOUNTER — Encounter (HOSPITAL_COMMUNITY): Payer: HMO

## 2022-06-28 ENCOUNTER — Encounter (HOSPITAL_COMMUNITY): Payer: HMO

## 2022-06-30 ENCOUNTER — Encounter (HOSPITAL_COMMUNITY): Payer: HMO

## 2022-07-03 ENCOUNTER — Encounter (HOSPITAL_COMMUNITY): Payer: HMO

## 2022-07-05 ENCOUNTER — Encounter (HOSPITAL_COMMUNITY)
Admission: RE | Admit: 2022-07-05 | Discharge: 2022-07-05 | Disposition: A | Discharge status: Home / Self Care | Payer: HMO | Source: Ambulatory Visit | Attending: Cardiology | Admitting: Cardiology

## 2022-07-05 DIAGNOSIS — Z952 Presence of prosthetic heart valve: Secondary | ICD-10-CM | POA: Insufficient documentation

## 2022-07-05 NOTE — Progress Notes (Signed)
Daily Session Note  Patient Details  Name: Sarah Pope MRN: [id number] Date of Birth: 01/12/1956 Referring Provider:   Flowsheet Row CARDIAC REHAB PHASE II ORIENTATION from 04/11/2022 in [place]  Referring Provider Dr. Cyndia Bent       Encounter Date: 07/05/2022  Check In:  Session Check In - 07/05/22 0927       Check-In   Supervising physician immediately available to respond to emergencies [facility] MD immediately available    Physician(s) Dr Domenic Polite    Location [facility]    Staff Present Redge Gainer, BS, Exercise Physiologist;Daphyne Hassell Done, RN, BSN    Virtual Visit No    Medication changes reported     No    Fall or balance concerns reported    Yes    Comments She has not fallen, but reports that she frequently loses her balance but catches herself. She also frequently gets dizzy and lightheaded due to her medications.    Tobacco Cessation No Change    Warm-up and Cool-down Performed as group-led instruction    Resistance Training Performed Yes    VAD Patient? No    PAD/SET Patient? No      Pain Assessment   Currently in Pain? No/denies    Multiple Pain Sites No             Capillary Blood Glucose: No results found for this or any previous visit (from the past 24 hour(s)).    Social History   Tobacco Use  Smoking Status Former   Packs/day: 0.50   Years: 5.00   Total pack years: 2.50   Types: Cigarettes   Quit date: 10/23/1982   Years since quitting: 39.7  Smokeless Tobacco Never    Goals Met:  Independence with exercise equipment Exercise tolerated well No report of concerns or symptoms today Strength training completed today  Goals Unmet:  Not Applicable  Comments: checkout time is 1030   Dr. Carlyle Dolly is Medical Director for [facility]

## 2022-07-05 NOTE — Progress Notes (Signed)
Cardiac Individual Treatment Plan  Patient Details  Name: MERAL GEISSINGER MRN: [id number] Date of Birth: Aug 29, 1956 Referring Provider:   Flowsheet Row CARDIAC REHAB PHASE II ORIENTATION from 04/11/2022 in [facility]  Referring Provider Dr. Cyndia Bent       Initial Encounter Date:  Flowsheet Row CARDIAC REHAB PHASE II ORIENTATION from 04/11/2022 in [facility]  Date 04/11/22       Visit Diagnosis: S/P AVR  Patient's Home Medications on Admission:  Current Outpatient Medications:    acetaminophen (TYLENOL) 325 MG tablet, Take 2 tablets (650 mg total) by mouth every 6 (six) hours as needed for mild pain., Disp: , Rfl:    albuterol (VENTOLIN HFA) 108 (90 Base) MCG/ACT inhaler, Inhale 2 puffs into the lungs every 6 (six) hours as needed for shortness of breath., Disp: , Rfl:    amoxicillin (AMOXIL) 500 MG capsule, Take 500 mg by mouth 3 (three) times daily., Disp: , Rfl:    aspirin EC 81 MG tablet, Take 81 mg by mouth daily. Swallow whole., Disp: , Rfl:    atorvastatin (LIPITOR) 20 MG tablet, Take 1 tablet (20 mg total) by mouth daily., Disp: 90 tablet, Rfl: 1   azelastine (ASTELIN) 0.1 % nasal spray, Place 1 spray into both nostrils 2 (two) times daily as needed for rhinitis or allergies. Use in each nostril as directed, Disp: , Rfl:    cetirizine (ZYRTEC) 10 MG tablet, Take 10 mg by mouth daily as needed for allergies., Disp: , Rfl:    ciclopirox (PENLAC) 8 % solution, Apply topically at bedtime. Apply over nail and surrounding skin. Apply daily over previous coat. After seven (7) days, may remove with alcohol and continue cycle. (Patient taking differently: Apply 1 Application topically 3 (three) times a week. Apply over nail and surrounding skin. Apply daily over previous coat. After seven (7) days, may remove with alcohol and continue cycle.), Disp: 6.6 mL, Rfl: 2   citalopram (CELEXA) 40 MG tablet, Take 1 tablet (40 mg total) by mouth daily.,  Disp: 90 tablet, Rfl: 1   Continuous Blood Gluc Receiver (FREESTYLE LIBRE 14 DAY READER) DEVI, USE AS DIRECTED THREE TIMES DAILY., Disp: 1 each, Rfl: PRN   Continuous Blood Gluc Sensor (FREESTYLE LIBRE 14 DAY SENSOR) MISC, USE TO CHECK BLOOD SUGAR THREE TIMES DAILY WITH SLIDING SCALE INSULIN USE., Disp: 2 each, Rfl: 2   diclofenac (VOLTAREN) 75 MG EC tablet, TAKE ONE TABLET BY MOUTH TWICE daily PRN pain. TAKE WITH FOOD. (Patient taking differently: Take 75 mg by mouth See admin instructions. Take 1 tablet (75 mg) by mouth (scheduled) every morning, may take an additional dose if needed for pain.), Disp: 28 tablet, Rfl: 5   Fluticasone-Salmeterol (ADVAIR DISKUS) 250-50 MCG/DOSE AEPB, Inhale 1 puff into the lungs 2 (two) times daily. (Patient taking differently: Inhale 1 puff into the lungs 2 (two) times daily as needed (respiratory issues.).), Disp: 1 each, Rfl: 3   insulin glargine (LANTUS) 100 UNIT/ML Solostar Pen, Inject 80 Units into the skin at bedtime., Disp: 45 mL, Rfl: 3   insulin lispro (HUMALOG KWIKPEN) 100 UNIT/ML KwikPen, Inject 5-11 Units into the skin 3 (three) times daily., Disp: 30 mL, Rfl: 3   levothyroxine (SYNTHROID) 175 MCG tablet, Take 1 tablet (175 mcg total) by mouth daily., Disp: 90 tablet, Rfl: 1   losartan (COZAAR) 25 MG tablet, Take 1 tablet (25 mg total) by mouth daily., Disp: 90 tablet, Rfl: 3   metFORMIN (GLUCOPHAGE) 1000 MG tablet, Take  1 tablet (1,000 mg total) by mouth 2 (two) times daily with a meal. (Patient taking differently: Take 1,000 mg by mouth as needed.), Disp: 180 tablet, Rfl: 1   metoprolol tartrate (LOPRESSOR) 25 MG tablet, Take 1 tablet (25 mg total) by mouth 2 (two) times daily., Disp: 60 tablet, Rfl: 3   omeprazole (PRILOSEC) 40 MG capsule, Take 1 capsule (40 mg total) by mouth daily., Disp: 90 capsule, Rfl: 1   ondansetron (ZOFRAN) 4 MG tablet, Take 1 tablet (4 mg total) by mouth every 8 (eight) hours as needed for nausea or vomiting., Disp: 20 tablet,  Rfl: 0   potassium chloride SA (KLOR-CON M) 20 MEQ tablet, Take 1 tablet (20 mEq total) by mouth daily., Disp: 7 tablet, Rfl: 0   Semaglutide, 1 MG/DOSE, 4 MG/3ML SOPN, Inject 1 mg as directed once a week., Disp: 3 mL, Rfl: 3   traMADol (ULTRAM) 50 MG tablet, Take 1 tablet (50 mg total) by mouth every 6 (six) hours as needed., Disp: 30 tablet, Rfl: 0  Past Medical History: Past Medical History:  Diagnosis Date   Acute respiratory failure with hypercapnia (HCC)    Allergy    Anxiety    Aortic stenosis    a. S/p AVR on 02/06/2022 using a 21 mm Edwards Inspiris Resilina pericardial valve   Asthma    Bronchitis with bronchospasm    Cancer ([place])    skin   Depression    DM type 2 (diabetes mellitus, type 2) ([place])    FATTY LIVER DISEASE 08/05/2008   GERD (gastroesophageal reflux disease)    Hyperlipidemia    Hypertension    Hypothyroidism    Kidney stones    Leukocytosis    Obesity, Class III, BMI 40-49.9 (morbid obesity) ([place])    PERSONAL HX COLONIC POLYPS 09/01/2008   PONV (postoperative nausea and vomiting)    Seasonal allergies    TRANSAMINASES, SERUM, ELEVATED 08/05/2008    Tobacco Use: Social History   Tobacco Use  Smoking Status Former   Packs/day: 0.50   Years: 5.00   Total pack years: 2.50   Types: Cigarettes   Quit date: 10/23/1982   Years since quitting: 39.7  Smokeless Tobacco Never    Labs: Review Flowsheet  More data exists      Latest Ref Rng & Units 10/25/2021 12/01/2021 02/02/2022 02/06/2022 06/08/2022  Labs for ITP Cardiac and Pulmonary Rehab  Hemoglobin A1c 4.0 - 5.6 % 9.0  - 8.6  - 7.7   PH, Arterial 7.35 - 7.45 - 7.342  7.41  7.308  7.286  7.321  7.417  7.431  7.479  7.486  7.383  -  PCO2 arterial 32 - 48 mmHg - 48.7  39  50.2  50.5  46.3  42.7  43.0  37.1  36.7  46.0  -  Bicarbonate 20.0 - 28.0 mmol/L - 26.4  28.3  24.7  25.2  24.1  24.2  27.5  28.6  26.8  27.6  27.7  27.4  -  TCO2 22 - 32 mmol/L - 28  30  - '27  26  26  27  29  30  29  28  28  29  29  26   28  29  '$ -  Acid-base deficit 0.0 - 2.0 mmol/L - - - 1.0  3.0  2.0  -  O2 Saturation % - 99.0  76.0  98.6  95  97  93  100  100  83  100  100  100  -    Capillary Blood Glucose: Lab Results  Component Value Date   GLUCAP 100 (H) 02/11/2022   GLUCAP 226 (H) 02/10/2022   GLUCAP 278 (H) 02/10/2022   GLUCAP 145 (H) 02/10/2022   GLUCAP 95 02/10/2022    POCT Glucose     Row Name 04/11/22 0842             POCT Blood Glucose   Pre-Exercise 151 mg/dL                Exercise Target Goals: Exercise Program Goal: Individual exercise prescription set using results from initial 6 min walk test and THRR while considering  patient's activity barriers and safety.   Exercise Prescription Goal: Starting with aerobic activity 30 plus minutes a day, 3 days per week for initial exercise prescription. Provide home exercise prescription and guidelines that participant acknowledges understanding prior to discharge.  Activity Barriers & Risk Stratification:  Activity Barriers & Cardiac Risk Stratification - 04/11/22 0901       Activity Barriers & Cardiac Risk Stratification   Activity Barriers Joint Problems;Balance Concerns    Cardiac Risk Stratification High             6 Minute Walk:  6 Minute Walk     Row Name 04/11/22 0947         6 Minute Walk   Phase Initial     Distance 1200 feet     Walk Time 6 minutes     # of Rest Breaks 0     MPH 2.27     METS 2.5     RPE 13     VO2 Peak 8.79     Symptoms No     Resting HR 72 bpm     Resting BP 94/64     Resting Oxygen Saturation  97 %     Exercise Oxygen Saturation  during 6 min walk 100 %     Max Ex. HR 85 bpm     Max Ex. BP 118/60     2 Minute Post BP 100/60              Oxygen Initial Assessment:   Oxygen Re-Evaluation:   Oxygen Discharge (Final Oxygen Re-Evaluation):   Initial Exercise Prescription:  Initial Exercise Prescription - 04/11/22 0900       Date of Initial Exercise RX and Referring  Provider   Date 04/11/22    Referring Provider Dr. Cyndia Bent    Expected Discharge Date 07/06/22      Treadmill   MPH 1    Grade 0    Minutes 17      NuStep   Level 1    SPM 60    Minutes 22      Prescription Details   Frequency (times per week) 3    Duration Progress to 30 minutes of continuous aerobic without signs/symptoms of physical distress      Intensity   THRR 40-80% of Max Heartrate 62-125    Ratings of Perceived Exertion 11-13      Resistance Training   Training Prescription Yes    Weight 3    Reps 10-15             Perform Capillary Blood Glucose checks as needed.  Exercise Prescription Changes:   Exercise Prescription Changes     Row Name 04/24/22 1000 05/08/22 1000 05/22/22 1000 06/05/22 1000 06/19/22 1000     Response to Exercise   Blood Pressure (  Admit) 102/60 100/56 92/52 110/58 102/58   Blood Pressure (Exercise) 110/60 110/56 106/60 118/60 114/50   Blood Pressure (Exit) 98/60 90/50 100/60 100/56 112/62   Heart Rate (Admit) 75 bpm 77 bpm 73 bpm 77 bpm 69 bpm   Heart Rate (Exercise) 93 bpm 104 bpm 98 bpm 102 bpm 103 bpm   Heart Rate (Exit) 84 bpm 86 bpm 82 bpm 86 bpm 78 bpm   Rating of Perceived Exertion (Exercise) '12 11 12 12 12   '$ Duration Continue with 30 min of aerobic exercise without signs/symptoms of physical distress. Continue with 30 min of aerobic exercise without signs/symptoms of physical distress. Continue with 30 min of aerobic exercise without signs/symptoms of physical distress. Continue with 30 min of aerobic exercise without signs/symptoms of physical distress. Continue with 30 min of aerobic exercise without signs/symptoms of physical distress.   Intensity THRR unchanged THRR unchanged THRR unchanged THRR unchanged THRR unchanged     Progression   Progression Continue to progress workloads to maintain intensity without signs/symptoms of physical distress. Continue to progress workloads to maintain intensity without signs/symptoms of  physical distress. Continue to progress workloads to maintain intensity without signs/symptoms of physical distress. Continue to progress workloads to maintain intensity without signs/symptoms of physical distress. Continue to progress workloads to maintain intensity without signs/symptoms of physical distress.     Resistance Training   Training Prescription Yes Yes Yes Yes Yes   Weight '5 5 5 5 5   '$ Reps 10-15 10-15 10-15 10-15 10-15   Time 10 Minutes 10 Minutes 10 Minutes 10 Minutes 10 Minutes     Treadmill   MPH '1 1 2 '$ 2.3 2.5   Grade 0 0 0 1 3   Minutes '17 17 17 17 17   '$ METs 1.77 1.77 2.53 3.08 3.95     NuStep   Level '2 2 4 4 5   '$ SPM 34 93 83 87 84   Minutes '22 22 22 22 22   '$ METs 2.47 2.44 2.53 2.78 3.01     Home Exercise Plan   Plans to continue exercise at -- -- Home (comment) -- --   Frequency -- -- Add 2 additional days to program exercise sessions. -- --   Initial Home Exercises Provided -- -- 05/22/22 -- --            Exercise Comments:   Exercise Comments     Row Name 04/17/22 1249 05/22/22 1011         Exercise Comments Pt completed her first exercise session with no complaints. home exercise reviewed               Exercise Goals and Review:   Exercise Goals     Row Name 04/11/22 0949 05/08/22 1030 06/05/22 1024         Exercise Goals   Increase Physical Activity Yes Yes Yes     Intervention Provide advice, education, support and counseling about physical activity/exercise needs.;Develop an individualized exercise prescription for aerobic and resistive training based on initial evaluation findings, risk stratification, comorbidities and participant's personal goals. Provide advice, education, support and counseling about physical activity/exercise needs.;Develop an individualized exercise prescription for aerobic and resistive training based on initial evaluation findings, risk stratification, comorbidities and participant's personal goals. Provide  advice, education, support and counseling about physical activity/exercise needs.;Develop an individualized exercise prescription for aerobic and resistive training based on initial evaluation findings, risk stratification, comorbidities and participant's personal goals.     Expected Outcomes Short Term:  Attend rehab on a regular basis to increase amount of physical activity.;Long Term: Add in home exercise to make exercise part of routine and to increase amount of physical activity.;Long Term: Exercising regularly at least 3-5 days a week. Short Term: Attend rehab on a regular basis to increase amount of physical activity.;Long Term: Add in home exercise to make exercise part of routine and to increase amount of physical activity.;Long Term: Exercising regularly at least 3-5 days a week. Short Term: Attend rehab on a regular basis to increase amount of physical activity.;Long Term: Add in home exercise to make exercise part of routine and to increase amount of physical activity.;Long Term: Exercising regularly at least 3-5 days a week.     Increase Strength and Stamina Yes Yes Yes     Intervention Provide advice, education, support and counseling about physical activity/exercise needs.;Develop an individualized exercise prescription for aerobic and resistive training based on initial evaluation findings, risk stratification, comorbidities and participant's personal goals. Provide advice, education, support and counseling about physical activity/exercise needs.;Develop an individualized exercise prescription for aerobic and resistive training based on initial evaluation findings, risk stratification, comorbidities and participant's personal goals. Provide advice, education, support and counseling about physical activity/exercise needs.;Develop an individualized exercise prescription for aerobic and resistive training based on initial evaluation findings, risk stratification, comorbidities and participant's  personal goals.     Expected Outcomes Short Term: Increase workloads from initial exercise prescription for resistance, speed, and METs.;Short Term: Perform resistance training exercises routinely during rehab and add in resistance training at home;Long Term: Improve cardiorespiratory fitness, muscular endurance and strength as measured by increased METs and functional capacity (6MWT) Short Term: Increase workloads from initial exercise prescription for resistance, speed, and METs.;Short Term: Perform resistance training exercises routinely during rehab and add in resistance training at home;Long Term: Improve cardiorespiratory fitness, muscular endurance and strength as measured by increased METs and functional capacity (6MWT) Short Term: Increase workloads from initial exercise prescription for resistance, speed, and METs.;Short Term: Perform resistance training exercises routinely during rehab and add in resistance training at home;Long Term: Improve cardiorespiratory fitness, muscular endurance and strength as measured by increased METs and functional capacity (6MWT)     Able to understand and use rate of perceived exertion (RPE) scale Yes Yes Yes     Intervention Provide education and explanation on how to use RPE scale Provide education and explanation on how to use RPE scale Provide education and explanation on how to use RPE scale     Expected Outcomes Short Term: Able to use RPE daily in rehab to express subjective intensity level;Long Term:  Able to use RPE to guide intensity level when exercising independently Short Term: Able to use RPE daily in rehab to express subjective intensity level;Long Term:  Able to use RPE to guide intensity level when exercising independently Short Term: Able to use RPE daily in rehab to express subjective intensity level;Long Term:  Able to use RPE to guide intensity level when exercising independently     Knowledge and understanding of Target Heart Rate Range (THRR) Yes  Yes Yes     Intervention Provide education and explanation of THRR including how the numbers were predicted and where they are located for reference Provide education and explanation of THRR including how the numbers were predicted and where they are located for reference Provide education and explanation of THRR including how the numbers were predicted and where they are located for reference     Expected Outcomes Long Term: Able  to use THRR to govern intensity when exercising independently;Short Term: Able to state/look up THRR;Short Term: Able to use daily as guideline for intensity in rehab Long Term: Able to use THRR to govern intensity when exercising independently;Short Term: Able to state/look up THRR;Short Term: Able to use daily as guideline for intensity in rehab Long Term: Able to use THRR to govern intensity when exercising independently;Short Term: Able to state/look up THRR;Short Term: Able to use daily as guideline for intensity in rehab     Able to check pulse independently Yes Yes Yes     Intervention Provide education and demonstration on how to check pulse in carotid and radial arteries.;Review the importance of being able to check your own pulse for safety during independent exercise Provide education and demonstration on how to check pulse in carotid and radial arteries.;Review the importance of being able to check your own pulse for safety during independent exercise Provide education and demonstration on how to check pulse in carotid and radial arteries.;Review the importance of being able to check your own pulse for safety during independent exercise     Expected Outcomes Short Term: Able to explain why pulse checking is important during independent exercise;Long Term: Able to check pulse independently and accurately Short Term: Able to explain why pulse checking is important during independent exercise;Long Term: Able to check pulse independently and accurately Short Term: Able to  explain why pulse checking is important during independent exercise;Long Term: Able to check pulse independently and accurately     Understanding of Exercise Prescription Yes Yes Yes     Intervention Provide education, explanation, and written materials on patient's individual exercise prescription Provide education, explanation, and written materials on patient's individual exercise prescription Provide education, explanation, and written materials on patient's individual exercise prescription     Expected Outcomes Short Term: Able to explain program exercise prescription;Long Term: Able to explain home exercise prescription to exercise independently Short Term: Able to explain program exercise prescription;Long Term: Able to explain home exercise prescription to exercise independently Short Term: Able to explain program exercise prescription;Long Term: Able to explain home exercise prescription to exercise independently              Exercise Goals Re-Evaluation :  Exercise Goals Re-Evaluation     Row Name 05/08/22 1031 06/05/22 1025           Exercise Goal Re-Evaluation   Exercise Goals Review Increase Physical Activity;Increase Strength and Stamina;Able to understand and use rate of perceived exertion (RPE) scale;Knowledge and understanding of Target Heart Rate Range (THRR);Able to check pulse independently;Understanding of Exercise Prescription Increase Physical Activity;Increase Strength and Stamina;Able to understand and use rate of perceived exertion (RPE) scale;Knowledge and understanding of Target Heart Rate Range (THRR);Able to check pulse independently;Understanding of Exercise Prescription      Comments Pt has completed 8 sessions of cardiac rehab. She is motivated in class and is starting to push herself to increase her wokrload. She is currently exercising at 2.44 METs on the stepper. Will continue to monitor and progress as able. Pt has completed 18 sessions of cardiac rehab. She  continues to be motivated during class and is increasing her workload. She is currently exercising at 3.08 METs on the treadmill. Will continue to monitor and progress as ablel.      Expected Outcomes Through exercise at home and at rehab, the patient will meet their stated goals. Through exercise at home and at rehab, the patient will meet their stated goals.  Discharge Exercise Prescription (Final Exercise Prescription Changes):  Exercise Prescription Changes - 06/19/22 1000       Response to Exercise   Blood Pressure (Admit) 102/58    Blood Pressure (Exercise) 114/50    Blood Pressure (Exit) 112/62    Heart Rate (Admit) 69 bpm    Heart Rate (Exercise) 103 bpm    Heart Rate (Exit) 78 bpm    Rating of Perceived Exertion (Exercise) 12    Duration Continue with 30 min of aerobic exercise without signs/symptoms of physical distress.    Intensity THRR unchanged      Progression   Progression Continue to progress workloads to maintain intensity without signs/symptoms of physical distress.      Resistance Training   Training Prescription Yes    Weight 5    Reps 10-15    Time 10 Minutes      Treadmill   MPH 2.5    Grade 3    Minutes 17    METs 3.95      NuStep   Level 5    SPM 84    Minutes 22    METs 3.01             Nutrition:  Target Goals: Understanding of nutrition guidelines, daily intake of sodium '1500mg'$ , cholesterol '200mg'$ , calories 30% from fat and 7% or less from saturated fats, daily to have 5 or more servings of fruits and vegetables.  Biometrics:  Pre Biometrics - 04/11/22 0950       Pre Biometrics   Height '5\' 5"'$  (1.651 m)    Weight 88.7 kg    Waist Circumference 42 inches    Hip Circumference 43 inches    Waist to Hip Ratio 0.98 %    BMI (Calculated) 32.54    Triceps Skinfold 25 mm    % Body Fat 43 %    Grip Strength 18.8 kg    Flexibility 8 in    Single Leg Stand 3.48 seconds              Nutrition Therapy Plan and  Nutrition Goals:  Nutrition Therapy & Goals - 05/01/22 0904       Personal Nutrition Goals   Comments Patient scored 3 on her diet assessment. We offer 2 educational sessions on heart healthy nutrition with handouts.      Intervention Plan   Intervention Nutrition handout(s) given to patient.    Expected Outcomes Short Term Goal: Understand basic principles of dietary content, such as calories, fat, sodium, cholesterol and nutrients.             Nutrition Assessments:  Nutrition Assessments - 04/11/22 0906       MEDFICTS Scores   Pre Score 3            MEDIFICTS Score Key: ?70 Need to make dietary changes  40-70 Heart Healthy Diet ? 40 Therapeutic Level Cholesterol Diet   Picture Your Plate Scores: <06 Unhealthy dietary pattern with much room for improvement. 41-50 Dietary pattern unlikely to meet recommendations for good health and room for improvement. 51-60 More healthful dietary pattern, with some room for improvement.  >60 Healthy dietary pattern, although there may be some specific behaviors that could be improved.    Nutrition Goals Re-Evaluation:   Nutrition Goals Discharge (Final Nutrition Goals Re-Evaluation):   Psychosocial: Target Goals: Acknowledge presence or absence of significant depression and/or stress, maximize coping skills, provide positive support system. Participant is able to verbalize types and ability to use  techniques and skills needed for reducing stress and depression.  Initial Review & Psychosocial Screening:  Initial Psych Review & Screening - 04/11/22 0902       Initial Review   Current issues with Current Depression;Current Anxiety/Panic;Current Stress Concerns    Source of Stress Concerns Family    Comments Her significant other, who she has lived with for 20 years, is now 51 and in declining health. 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       Family Dynamics   Good Support System? Yes    Comments Her support system includes her daughters and her friends.      Barriers   Psychosocial barriers to participate in program The patient should benefit from training in stress management and relaxation.      Screening Interventions   Interventions Encouraged to exercise;Provide feedback about the scores to participant    Expected Outcomes Long Term goal: The participant improves quality of Life and PHQ9 Scores as seen by post scores and/or verbalization of changes;Short Term goal: Identification and review with participant of any Quality of Life or Depression concerns found by scoring the questionnaire.;Long Term Goal: Stressors or current issues are controlled or eliminated.             Quality of Life Scores:  Quality of Life - 04/11/22 0950       Quality of Life   Select Quality of Life      Quality of Life Scores   Health/Function Pre 27.2 %    Socioeconomic Pre 30 %    Psych/Spiritual Pre 27.43 %    Family Pre 28.3 %    GLOBAL Pre 28.04 %            Scores of 19 and below usually indicate a poorer quality of life in these areas.  A difference of  2-3 points is a clinically meaningful difference.  A difference of 2-3 points in the total score of the Quality of Life Index has been associated with significant improvement in overall quality of life, self-image, physical symptoms, and general health in studies assessing change in quality of life.  PHQ-9: Review Flowsheet  More data exists      04/11/2022 09/27/2020 08/09/2020 04/20/2020 08/15/2019  Depression screen PHQ 2/9  Decreased Interest 1 0 0 0 1  Down, Depressed, Hopeless 0 0 0 0 1  PHQ - 2 Score 1 0 0 0 2  Altered sleeping 0 - 0 0 1  Tired, decreased energy 1 - 0 2 1  Change in appetite 0 - 0 0 0  Feeling bad or failure about yourself  0 - 0 0 0  Trouble concentrating 0 - 1 1 0  Moving slowly or fidgety/restless 0 - 0 0 0  Suicidal thoughts 0 - 0 0 0   PHQ-9 Score 2 - '1 3 4  '$ Difficult doing work/chores Not difficult at all - Not difficult at all Not difficult at all Not difficult at all   Interpretation of Total Score  Total Score Depression Severity:  1-4 = Minimal depression, 5-9 = Mild depression, 10-14 = Moderate depression, 15-19 = Moderately severe depression, 20-27 = Severe depression   Psychosocial Evaluation and Intervention:  Psychosocial Evaluation - 04/11/22 0921       Psychosocial Evaluation & Interventions   Interventions Stress management education;Relaxation education;Encouraged to exercise with the program and follow exercise prescription    Comments  Pt has no barriers to participating in CR. Pt does have anxiety and depression which is treated with citalopram 40 mg daily. She reports that this treatment helps. Pt also has current stress concerns. Her significant other, Ebony Hail, is 65 years old and is in declining health. She has lived with him for the past 20 years. 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 She is hopeful that as she gets her energy back, she will be less stressed about this. She scored a 2 on her PHQ-9 and relates this to her lack of energy and lack of interest due to her lack of energy. She reports that she has a good support system from her two daughters and from her group of friends. Her goals while in the program are to improve her energy levels and to lose weight. She is eager to start the program.    Expected Outcomes Pt's depression and anxiety will continue to be medically treated, she will learn to positively cope with her stressors through provided education, and she will have no other identifiable psychosocial issues.    Continue Psychosocial Services  No Follow up required             Psychosocial Re-Evaluation:  Psychosocial Re-Evaluation     [place] Name 05/01/22 0855 05/29/22 0916 06/28/22  0929         Psychosocial Re-Evaluation   Current issues with Current Stress Concerns;Current Depression;Current Anxiety/Panic;Current Psychotropic Meds Current Stress Concerns;Current Depression;Current Anxiety/Panic;Current Psychotropic Meds Current Stress Concerns;Current Depression;Current Anxiety/Panic;Current Psychotropic Meds     Comments Patient is new to the program completing 5 sessions. She continues to have no psychosocial barriers identified. Her depression and anxiety continue to be managed with Citalopram. She demonstrates an interest in improving her health overall and seems to enjoy the sessions. We will continue to monitor her progress. Patient is new to the program completing 15 sessions. She continues to have no psychosocial barriers identified. Her depression and anxiety continue to be managed with Citalopram. She continues to demonstrate an interest in improving her health overall and enjoys the sessions. We will continue to monitor her progress. Patient has completed 22 sessions. She continues to have no psychosocial barriers identified. Her depression and anxiety continue to be managed with Citalopram. She continues to demonstrate an interest in improving her health overall and enjoys the sessions. We will continue to monitor her progress.     Expected Outcomes Patient will continue to have no psychosocial barriers identified and her depression and anxiety will continue to be managed. Patient will continue to have no psychosocial barriers identified and her depression and anxiety will continue to be managed. Patient will continue to have no psychosocial barriers identified and her depression and anxiety will continue to be managed.     Interventions Encouraged to attend Cardiac Rehabilitation for the exercise;Relaxation education;Stress management education Encouraged to attend Cardiac Rehabilitation for the exercise;Relaxation education;Stress management education Encouraged to  attend Cardiac Rehabilitation for the exercise;Relaxation education;Stress management education     Continue Psychosocial Services  No Follow up required No Follow up required No Follow up required       Initial Review   Source of Stress Concerns Family Family Family     Comments Her significant other, who she has lived with for 20 years, is now 8 and in declining health. She has been his caretaker and has been stressful for her  especially now that she is recovering from her AVR surgery. Her significant other, who she has lived with for 20 years, is now 84 and in declining health. 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 Her significant other, who she has lived with for 20 years, is now 7 and in declining health. 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              Psychosocial Discharge (Final Psychosocial Re-Evaluation):  Psychosocial Re-Evaluation - 06/28/22 0929       Psychosocial Re-Evaluation   Current issues with Current Stress Concerns;Current Depression;Current Anxiety/Panic;Current Psychotropic Meds    Comments Patient has completed 22 sessions. She continues to have no psychosocial barriers identified. Her depression and anxiety continue to be managed with Citalopram. She continues to demonstrate an interest in improving her health overall and enjoys the sessions. We will continue to monitor her progress.    Expected Outcomes Patient will continue to have no psychosocial barriers identified and her depression and anxiety will continue to be managed.    Interventions Encouraged to attend Cardiac Rehabilitation for the exercise;Relaxation education;Stress management education    Continue Psychosocial Services  No Follow up required      Initial Review   Source of Stress Concerns Family    Comments Her significant other, who she  has lived with for 20 years, is now 30 and in declining health. 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             Vocational Rehabilitation: Provide vocational rehab assistance to qualifying candidates.   Vocational Rehab Evaluation & Intervention:   Education: Education Goals: Education classes will be provided on a weekly basis, covering required topics. Participant will state understanding/return demonstration of topics presented.  Learning Barriers/Preferences:  Learning Barriers/Preferences - 04/11/22 0907       Learning Barriers/Preferences   Learning Barriers Sight;Hearing    Learning Preferences Skilled Demonstration             Education Topics: Hypertension, Hypertension Reduction -Define heart disease and high blood pressure. Discus how high blood pressure affects the body and ways to reduce high blood pressure. Flowsheet Row CARDIAC REHAB PHASE II EXERCISE from 06/14/2022 in [place]  Date 04/26/22  Educator [place]  Instruction Review Code 1- Verbalizes Understanding       Exercise and Your Heart -Discuss why it is important to exercise, the FITT principles of exercise, normal and abnormal responses to exercise, and how to exercise safely. Flowsheet Row CARDIAC REHAB PHASE II EXERCISE from 06/14/2022 in [place]  Date 05/03/22  Educator [place]  Instruction Review Code 1- Verbalizes Understanding       Angina -Discuss definition of angina, causes of angina, treatment of angina, and how to decrease risk of having angina. Flowsheet Row CARDIAC REHAB PHASE II EXERCISE from 06/14/2022 in [place]  Date 05/10/22  Educator H[place]  Instruction Review Code 1- Verbalizes Understanding       Cardiac Medications -Review what the following cardiac medications are used for, how they affect the body, and side effects  that may occur when taking the medications.  Medications include Aspirin, Beta blockers, calcium channel blockers, ACE Inhibitors, angiotensin receptor blockers, diuretics, digoxin, and antihyperlipidemics.  Congestive Heart Failure -Discuss the definition of CHF, how to live with CHF, the signs and symptoms of CHF, and how keep track of weight and sodium intake. Flowsheet Row CARDIAC REHAB PHASE II EXERCISE from 06/14/2022 in [place]  Date 05/24/22  Educator pb  Instruction Review Code 1- Verbalizes Understanding       Heart Disease and Intimacy -Discus the effect sexual activity has on the heart, how changes occur during intimacy as we age, and safety during sexual activity. Flowsheet Row CARDIAC REHAB PHASE II EXERCISE from 06/14/2022 in [place]  Date 05/31/22  Educator pb  Instruction Review Code 1- Verbalizes Understanding       Smoking Cessation / COPD -Discuss different methods to quit smoking, the health benefits of quitting smoking, and the definition of COPD. Flowsheet Row CARDIAC REHAB PHASE II EXERCISE from 06/14/2022 in [place]  Date 06/07/22  Educator [place]  Instruction Review Code 2- Demonstrated Understanding       Nutrition I: Fats -Discuss the types of cholesterol, what cholesterol does to the heart, and how cholesterol levels can be controlled. Flowsheet Row CARDIAC REHAB PHASE II EXERCISE from 06/14/2022 in [place]  Date 06/14/22  Educator DF  Instruction Review Code 2- Demonstrated Understanding       Nutrition II: Labels -Discuss the different components of food labels and how to read food label   Heart Parts/Heart Disease and PAD -Discuss the anatomy of the heart, the pathway of blood circulation through the heart, and these are affected by heart disease.   Stress I: Signs and Symptoms -Discuss the causes of stress, how stress may lead to anxiety  and depression, and ways to limit stress.   Stress II: Relaxation -Discuss different types of relaxation techniques to limit stress. Flowsheet Row CARDIAC REHAB PHASE II EXERCISE from 06/14/2022 in [place]  Date 04/19/22  Educator [place]  Instruction Review Code 1- Verbalizes Understanding       Warning Signs of Stroke / TIA -Discuss definition of a stroke, what the signs and symptoms are of a stroke, and how to identify when someone is having stroke.   Knowledge Questionnaire Score:  Knowledge Questionnaire Score - 04/11/22 0908       Knowledge Questionnaire Score   Pre Score 15/24             Core Components/Risk Factors/Patient Goals at Admission:  Personal Goals and Risk Factors at Admission - 04/11/22 0914       Core Components/Risk Factors/Patient Goals on Admission    Weight Management Yes;Obesity;Weight Loss    Intervention Weight Management: Develop a combined nutrition and exercise program designed to reach desired caloric intake, while maintaining appropriate intake of nutrient and fiber, sodium and fats, and appropriate energy expenditure required for the weight goal.;Weight Management: Provide education and appropriate resources to help participant work on and attain dietary goals.;Weight Management/Obesity: Establish reasonable short term and long term weight goals.;Obesity: Provide education and appropriate resources to help participant work on and attain dietary goals.    Expected Outcomes Short Term: Continue to assess and modify interventions until short term weight is achieved;Long Term: Adherence to nutrition and physical activity/exercise program aimed toward attainment of established weight goal;Weight Maintenance: Understanding of the daily nutrition guidelines, which includes 25-35% calories from fat, 7% or less cal from saturated fats, less than '200mg'$  cholesterol, less than 1.5gm of sodium, & 5 or more servings of fruits and  vegetables daily;Weight  Loss: Understanding of general recommendations for a balanced deficit meal plan, which promotes 1-2 lb weight loss per week and includes a negative energy balance of [phone] kcal/d;Understanding recommendations for meals to include 15-35% energy as protein, 25-35% energy from fat, 35-60% energy from carbohydrates, less than '200mg'$  of dietary cholesterol, 20-35 gm of total fiber daily;Understanding of distribution of calorie intake throughout the day with the consumption of 4-5 meals/snacks    Diabetes Yes    Intervention Provide education about signs/symptoms and action to take for hypo/hyperglycemia.;Provide education about proper nutrition, including hydration, and aerobic/resistive exercise prescription along with prescribed medications to achieve blood glucose in normal ranges: Fasting glucose 65-99 mg/dL    Expected Outcomes Short Term: Participant verbalizes understanding of the signs/symptoms and immediate care of hyper/hypoglycemia, proper foot care and importance of medication, aerobic/resistive exercise and nutrition plan for blood glucose control.;Long Term: Attainment of HbA1C < 7%.    Hypertension Yes    Intervention Provide education on lifestyle modifcations including regular physical activity/exercise, weight management, moderate sodium restriction and increased consumption of fresh fruit, vegetables, and low fat dairy, alcohol moderation, and smoking cessation.;Monitor prescription use compliance.    Expected Outcomes Short Term: Continued assessment and intervention until BP is < 140/68m HG in hypertensive participants. < 130/863mHG in hypertensive participants with diabetes, heart failure or chronic kidney disease.;Long Term: Maintenance of blood pressure at goal levels.    Lipids Yes    Intervention Provide education and support for participant on nutrition & aerobic/resistive exercise along with prescribed medications to achieve LDL '70mg'$ , HDL >'40mg'$ .     Expected Outcomes Short Term: Participant states understanding of desired cholesterol values and is compliant with medications prescribed. Participant is following exercise prescription and nutrition guidelines.;Long Term: Cholesterol controlled with medications as prescribed, with individualized exercise RX and with personalized nutrition plan. Value goals: LDL < '70mg'$ , HDL > 40 mg.    Personal Goal Other Yes    Personal Goal Improve energy levels.    Intervention Attend CR three days per week and begin a home exercise program.    Expected Outcomes Pt will meet stated goal.             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 She does have multipe risk factors for CAD and is participating in the program for risk modification. She is new to the program completing 5 sessions. Her current weight is 195.2 lbs unchanged from her initial weight. Her blood pressure is well controlled. Her last A1C was 02/02/22 at 8.6% which is trending down. She is on Metformin and Ozempic for DM controll. Her personal goals for the program are to improve her energy levels and lose weight. We will continue to monitor her progress as she works towards meeting these personal goals. Patient has completed 15 sessions. Her current weight is 195.0 lbs unchanged from last 30 day review. She is doing well in the program with consistent attendance and progressions. Her blood pressure continues to be well controlled. Her last A1C was 02/02/22 at 8.6% which is trending down. She is on Metformin, Insulin and Ozempic for DM  control. Her personal goals for the program are to improve her energy  levels and lose weight. We will continue to monitor her progress as she works towards meeting these personal goals. Patient has completed 22 sessions. Her current weight is 197.0 lbs gaining 2 lbs from last 30 day review. She continues to do well in the program with consistent attendance and progressions. Her blood pressure continues to be well controlled. She saw Endocrinologist 8/17. Her  A1C was 7.7% improved from 8.6%.  She is on Metformin, Insulin and Ozempic for DM control. MD adjusted her Humalog and wanted her to monitor her glucose more frequently than QD. Also advised her on better dietary habits. Her personal goals for the program are to improve her energy levels and lose weight. We will continue to monitor her progress as she works towards meeting these personal goals.     Expected Outcomes Patient will complete the program meeting both perosnal and program goals. Patient will complete the program meeting both perosnal and program goals. Patient will complete the program meeting both perosnal and program goals.              Core Components/Risk Factors/Patient Goals at Discharge (Final Review):   Goals and Risk Factor Review - 06/28/22 0930       Core Components/Risk Factors/Patient Goals Review   Personal Goals Review Weight Management/Obesity;Hypertension;Lipids;Diabetes;Other    Review Patient has completed 22 sessions. Her current weight is 197.0 lbs gaining 2 lbs from last 30 day review. She continues to do well in the program with consistent attendance and progressions. Her blood pressure continues to be well controlled. She saw Endocrinologist 8/17. Her  A1C was 7.7% improved from 8.6%.  She is on Metformin, Insulin and Ozempic for DM control. MD adjusted her Humalog and wanted her to monitor her glucose more frequently than QD. Also advised her on better dietary habits. Her personal goals for the program are to  improve her energy levels and lose weight. We will continue to monitor her progress as she works towards meeting these personal goals.    Expected Outcomes Patient will complete the program meeting both perosnal and program goals.             ITP Comments:   Comments: ITP REVIEW Pt is making expected progress toward Cardiac Rehab goals after completing 22 sessions. Recommend continued exercise, life style modification, education, and increased stamina and strength.

## 2022-07-07 ENCOUNTER — Encounter (HOSPITAL_COMMUNITY)
Admission: RE | Admit: 2022-07-07 | Discharge: 2022-07-07 | Disposition: A | Discharge status: Home / Self Care | Payer: HMO | Source: Ambulatory Visit | Attending: Cardiology | Admitting: Cardiology

## 2022-07-07 VITALS — Ht 65.0 in | Wt 197.3 lb

## 2022-07-07 DIAGNOSIS — Z952 Presence of prosthetic heart valve: Secondary | ICD-10-CM

## 2022-07-07 NOTE — Progress Notes (Signed)
Daily Session Note  Patient Details  Name: Sarah Pope MRN: [id number] Date of Birth: July 15, 1956 Referring Provider:   Flowsheet Row CARDIAC REHAB PHASE II ORIENTATION from 04/11/2022 in [facility]  Referring Provider Dr. Cyndia Bent       Encounter Date: 07/07/2022  Check In:  Session Check In - 07/07/22 0925       Check-In   Supervising physician immediately available to respond to emergencies PAD/SET Supervising Physician    Physician(s) Dr. Gasper Sells    Location [facility]    Staff Present Aundra Dubin, RN, Bjorn Loser, MS, ACSM-CEP, Exercise Physiologist    Virtual Visit No    Medication changes reported     No    Fall or balance concerns reported    Yes    Comments She has not fallen, but reports that she frequently loses her balance but catches herself. She also frequently gets dizzy and lightheaded due to her medications.    Tobacco Cessation No Change    Warm-up and Cool-down Performed as group-led instruction    Resistance Training Performed Yes    VAD Patient? No    PAD/SET Patient? No      Pain Assessment   Currently in Pain? No/denies    Multiple Pain Sites No             Capillary Blood Glucose: No results found for this or any previous visit (from the past 24 hour(s)).    Social History   Tobacco Use  Smoking Status Former   Packs/day: 0.50   Years: 5.00   Total pack years: 2.50   Types: Cigarettes   Quit date: 10/23/1982   Years since quitting: 39.7  Smokeless Tobacco Never    Goals Met:  Independence with exercise equipment Exercise tolerated well No report of concerns or symptoms today Strength training completed today  Goals Unmet:  Not Applicable  Comments: Check out 1030.   Dr. Carlyle Dolly is Medical Director for [facility]

## 2022-07-12 NOTE — Progress Notes (Cosign Needed Addendum)
Discharge Progress Report  Patient Details  Name: CIENA SAMPLEY MRN: [id number] Date of Birth: 31-May-1956 Referring Provider:   Flowsheet Row CARDIAC REHAB PHASE II ORIENTATION from 04/11/2022 in [facility]  Referring Provider Dr. Cyndia Bent        Number of Visits: 24  Reason for Discharge:  Patient reached a stable level of exercise. Patient independent in their exercise. Patient has met program and personal goals.  Smoking History:  Social History   Tobacco Use  Smoking Status Former   Packs/day: 0.50   Years: 5.00   Total pack years: 2.50   Types: Cigarettes   Quit date: 10/23/1982   Years since quitting: 39.7  Smokeless Tobacco Never    Diagnosis:  S/P AVR  ADL UCSD:   Initial Exercise Prescription:  Initial Exercise Prescription - 04/11/22 0900       Date of Initial Exercise RX and Referring Provider   Date 04/11/22    Referring Provider Dr. Cyndia Bent    Expected Discharge Date 07/06/22      Treadmill   MPH 1    Grade 0    Minutes 17      NuStep   Level 1    SPM 60    Minutes 22      Prescription Details   Frequency (times per week) 3    Duration Progress to 30 minutes of continuous aerobic without signs/symptoms of physical distress      Intensity   THRR 40-80% of Max Heartrate 62-125    Ratings of Perceived Exertion 11-13      Resistance Training   Training Prescription Yes    Weight 3    Reps 10-15             Discharge Exercise Prescription (Final Exercise Prescription Changes):  Exercise Prescription Changes - 06/19/22 1000       Response to Exercise   Blood Pressure (Admit) 102/58    Blood Pressure (Exercise) 114/50    Blood Pressure (Exit) 112/62    Heart Rate (Admit) 69 bpm    Heart Rate (Exercise) 103 bpm    Heart Rate (Exit) 78 bpm    Rating of Perceived Exertion (Exercise) 12    Duration Continue with 30 min of aerobic exercise without signs/symptoms of physical distress.    Intensity THRR  unchanged      Progression   Progression Continue to progress workloads to maintain intensity without signs/symptoms of physical distress.      Resistance Training   Training Prescription Yes    Weight 5    Reps 10-15    Time 10 Minutes      Treadmill   MPH 2.5    Grade 3    Minutes 17    METs 3.95      NuStep   Level 5    SPM 84    Minutes 22    METs 3.01             Functional Capacity:  6 Minute Walk     Row Name 04/11/22 0947 07/07/22 0950       6 Minute Walk   Phase Initial Discharge    Distance 1200 feet 1525 feet    Distance % Change -- 27.1 %    Distance Feet Change -- 325 ft    Walk Time 6 minutes 6 minutes    # of Rest Breaks 0 0    MPH 2.27 2.89    METS 2.5 3.28  RPE 13 12    VO2 Peak 8.79 11.49    Symptoms No No    Resting HR 72 bpm 80 bpm    Resting BP 94/64 100/50    Resting Oxygen Saturation  97 % 98 %    Exercise Oxygen Saturation  during 6 min walk 100 % 98 %    Max Ex. HR 85 bpm 104 bpm    Max Ex. BP 118/60 132/60    2 Minute Post BP 100/60 104/60             Psychological, QOL, Others - Outcomes: PHQ 2/9:    07/12/2022    1:47 PM 04/11/2022    8:49 AM 09/27/2020   11:10 AM 08/09/2020    1:08 PM 04/20/2020   11:07 AM  Depression screen PHQ 2/9  Decreased Interest 3 1 0 0 0  Down, Depressed, Hopeless 0 0 0 0 0  PHQ - 2 Score 3 1 0 0 0  Altered sleeping 1 0  0 0  Tired, decreased energy 1 1  0 2  Change in appetite 0 0  0 0  Feeling bad or failure about yourself  0 0  0 0  Trouble concentrating 0 0  1 1  Moving slowly or fidgety/restless 1 0  0 0  Suicidal thoughts 0 0  0 0  PHQ-9 Score '6 2  1 3  ' Difficult doing work/chores Somewhat difficult Not difficult at all  Not difficult at all Not difficult at all    Quality of Life:  Quality of Life - 07/12/22 1344       Quality of Life   Select Quality of Life      Quality of Life Scores   Health/Function Pre 27.2 %    Health/Function Post 30 %    Health/Function %  Change 10.29 %    Socioeconomic Pre 30 %    Socioeconomic Post 30 %    Socioeconomic % Change  0 %    Psych/Spiritual Pre 27.43 %    Psych/Spiritual Post 30 %    Psych/Spiritual % Change 9.37 %    Family Pre 28.3 %    Family Post 30 %    Family % Change 6.01 %    GLOBAL Pre 28.04 %    GLOBAL Post 30 %    GLOBAL % Change 6.99 %             Personal Goals: Goals established at orientation with interventions provided to work toward goal.  Personal Goals and Risk Factors at Admission - 04/11/22 0914       Core Components/Risk Factors/Patient Goals on Admission    Weight Management Yes;Obesity;Weight Loss    Intervention Weight Management: Develop a combined nutrition and exercise program designed to reach desired caloric intake, while maintaining appropriate intake of nutrient and fiber, sodium and fats, and appropriate energy expenditure required for the weight goal.;Weight Management: Provide education and appropriate resources to help participant work on and attain dietary goals.;Weight Management/Obesity: Establish reasonable short term and long term weight goals.;Obesity: Provide education and appropriate resources to help participant work on and attain dietary goals.    Expected Outcomes Short Term: Continue to assess and modify interventions until short term weight is achieved;Long Term: Adherence to nutrition and physical activity/exercise program aimed toward attainment of established weight goal;Weight Maintenance: Understanding of the daily nutrition guidelines, which includes 25-35% calories from fat, 7% or less cal from saturated fats, less than 274m cholesterol, less than  1.5gm of sodium, & 5 or more servings of fruits and vegetables daily;Weight Loss: Understanding of general recommendations for a balanced deficit meal plan, which promotes 1-2 lb weight loss per week and includes a negative energy balance of [phone] kcal/d;Understanding recommendations for meals to include  15-35% energy as protein, 25-35% energy from fat, 35-60% energy from carbohydrates, less than 287m of dietary cholesterol, 20-35 gm of total fiber daily;Understanding of distribution of calorie intake throughout the day with the consumption of 4-5 meals/snacks    Diabetes Yes    Intervention Provide education about signs/symptoms and action to take for hypo/hyperglycemia.;Provide education about proper nutrition, including hydration, and aerobic/resistive exercise prescription along with prescribed medications to achieve blood glucose in normal ranges: Fasting glucose 65-99 mg/dL    Expected Outcomes Short Term: Participant verbalizes understanding of the signs/symptoms and immediate care of hyper/hypoglycemia, proper foot care and importance of medication, aerobic/resistive exercise and nutrition plan for blood glucose control.;Long Term: Attainment of HbA1C < 7%.    Hypertension Yes    Intervention Provide education on lifestyle modifcations including regular physical activity/exercise, weight management, moderate sodium restriction and increased consumption of fresh fruit, vegetables, and low fat dairy, alcohol moderation, and smoking cessation.;Monitor prescription use compliance.    Expected Outcomes Short Term: Continued assessment and intervention until BP is < 140/947mHG in hypertensive participants. < 130/8030mG in hypertensive participants with diabetes, heart failure or chronic kidney disease.;Long Term: Maintenance of blood pressure at goal levels.    Lipids Yes    Intervention Provide education and support for participant on nutrition & aerobic/resistive exercise along with prescribed medications to achieve LDL <51m37mDL >40mg96m Expected Outcomes Short Term: Participant states understanding of desired cholesterol values and is compliant with medications prescribed. Participant is following exercise prescription and nutrition guidelines.;Long Term: Cholesterol controlled with medications  as prescribed, with individualized exercise RX and with personalized nutrition plan. Value goals: LDL < 51mg,61m > 40 mg.    Personal Goal Other Yes    Personal Goal Improve energy levels.    Intervention Attend CR three days per week and begin a home exercise program.    Expected Outcomes Pt will meet stated goal.              Personal Goals Discharge:  Goals and Risk Factor Review     Row Name 05/01/22 0859 05/29/22 0917 06/28/22 0930 07/12/22 1353       Core Components/Risk Factors/Patient Goals Review   Personal Goals Review Weight Management/Obesity;Hypertension;Lipids;Diabetes;Other Weight Management/Obesity;Hypertension;Lipids;Diabetes;Other Weight Management/Obesity;Hypertension;Lipids;Diabetes;Other Weight Management/Obesity;Hypertension;Lipids;Diabetes;Other    Review Patient was referred to CR with S/P AVR. She does have multipe risk factors for CAD and is participating in the program for risk modification. She is new to the program completing 5 sessions. Her current weight is 195.2 lbs unchanged from her initial weight. Her blood pressure is well controlled. Her last A1C was 02/02/22 at 8.6% which is trending down. She is on Metformin and Ozempic for DM controll. Her personal goals for the program are to improve her energy levels and lose weight. We will continue to monitor her progress as she works towards meeting these personal goals. Patient has completed 15 sessions. Her current weight is 195.0 lbs unchanged from last 30 day review. She is doing well in the program with consistent attendance and progressions. Her blood pressure continues to be well controlled. Her last A1C was 02/02/22 at 8.6% which is trending down. She is on Metformin, Insulin and Ozempic for DM  control. Her personal goals for the program are to improve her energy levels and lose weight. We will continue to monitor her progress as she works towards meeting these personal goals. Patient has completed 22 sessions.  Her current weight is 197.0 lbs gaining 2 lbs from last 30 day review. She continues to do well in the program with consistent attendance and progressions. Her blood pressure continues to be well controlled. She saw Endocrinologist 8/17. Her  A1C was 7.7% improved from 8.6%.  She is on Metformin, Insulin and Ozempic for DM control. MD adjusted her Humalog and wanted her to monitor her glucose more frequently than QD. Also advised her on better dietary habits. Her personal goals for the program are to improve her energy levels and lose weight. We will continue to monitor her progress as she works towards meeting these personal goals. Pt graduated from CR after 24 sessions. Her weight was stable while she was in the program. Her attendance was consistent and she gave good effort in the program. Her BP and HR were WNLs while in the program. Her A1C was 7.7% when checked on 8/17, and this was down from 8.6%. She was able to improve her walk test distance by 29.2%, and she now reports having more energy.    Expected Outcomes Patient will complete the program meeting both perosnal and program goals. Patient will complete the program meeting both perosnal and program goals. Patient will complete the program meeting both perosnal and program goals. Pt will continue to work towards her goals post discharge.             Exercise Goals and Review:  Exercise Goals     Row Name 04/11/22 0949 05/08/22 1030 06/05/22 1024         Exercise Goals   Increase Physical Activity Yes Yes Yes     Intervention Provide advice, education, support and counseling about physical activity/exercise needs.;Develop an individualized exercise prescription for aerobic and resistive training based on initial evaluation findings, risk stratification, comorbidities and participant's personal goals. Provide advice, education, support and counseling about physical activity/exercise needs.;Develop an individualized exercise prescription  for aerobic and resistive training based on initial evaluation findings, risk stratification, comorbidities and participant's personal goals. Provide advice, education, support and counseling about physical activity/exercise needs.;Develop an individualized exercise prescription for aerobic and resistive training based on initial evaluation findings, risk stratification, comorbidities and participant's personal goals.     Expected Outcomes Short Term: Attend rehab on a regular basis to increase amount of physical activity.;Long Term: Add in home exercise to make exercise part of routine and to increase amount of physical activity.;Long Term: Exercising regularly at least 3-5 days a week. Short Term: Attend rehab on a regular basis to increase amount of physical activity.;Long Term: Add in home exercise to make exercise part of routine and to increase amount of physical activity.;Long Term: Exercising regularly at least 3-5 days a week. Short Term: Attend rehab on a regular basis to increase amount of physical activity.;Long Term: Add in home exercise to make exercise part of routine and to increase amount of physical activity.;Long Term: Exercising regularly at least 3-5 days a week.     Increase Strength and Stamina Yes Yes Yes     Intervention Provide advice, education, support and counseling about physical activity/exercise needs.;Develop an individualized exercise prescription for aerobic and resistive training based on initial evaluation findings, risk stratification, comorbidities and participant's personal goals. Provide advice, education, support and counseling about physical activity/exercise needs.;Develop  an individualized exercise prescription for aerobic and resistive training based on initial evaluation findings, risk stratification, comorbidities and participant's personal goals. Provide advice, education, support and counseling about physical activity/exercise needs.;Develop an individualized  exercise prescription for aerobic and resistive training based on initial evaluation findings, risk stratification, comorbidities and participant's personal goals.     Expected Outcomes Short Term: Increase workloads from initial exercise prescription for resistance, speed, and METs.;Short Term: Perform resistance training exercises routinely during rehab and add in resistance training at home;Long Term: Improve cardiorespiratory fitness, muscular endurance and strength as measured by increased METs and functional capacity (6MWT) Short Term: Increase workloads from initial exercise prescription for resistance, speed, and METs.;Short Term: Perform resistance training exercises routinely during rehab and add in resistance training at home;Long Term: Improve cardiorespiratory fitness, muscular endurance and strength as measured by increased METs and functional capacity (6MWT) Short Term: Increase workloads from initial exercise prescription for resistance, speed, and METs.;Short Term: Perform resistance training exercises routinely during rehab and add in resistance training at home;Long Term: Improve cardiorespiratory fitness, muscular endurance and strength as measured by increased METs and functional capacity (6MWT)     Able to understand and use rate of perceived exertion (RPE) scale Yes Yes Yes     Intervention Provide education and explanation on how to use RPE scale Provide education and explanation on how to use RPE scale Provide education and explanation on how to use RPE scale     Expected Outcomes Short Term: Able to use RPE daily in rehab to express subjective intensity level;Long Term:  Able to use RPE to guide intensity level when exercising independently Short Term: Able to use RPE daily in rehab to express subjective intensity level;Long Term:  Able to use RPE to guide intensity level when exercising independently Short Term: Able to use RPE daily in rehab to express subjective intensity level;Long  Term:  Able to use RPE to guide intensity level when exercising independently     Knowledge and understanding of Target Heart Rate Range (THRR) Yes Yes Yes     Intervention Provide education and explanation of THRR including how the numbers were predicted and where they are located for reference Provide education and explanation of THRR including how the numbers were predicted and where they are located for reference Provide education and explanation of THRR including how the numbers were predicted and where they are located for reference     Expected Outcomes Long Term: Able to use THRR to govern intensity when exercising independently;Short Term: Able to state/look up THRR;Short Term: Able to use daily as guideline for intensity in rehab Long Term: Able to use THRR to govern intensity when exercising independently;Short Term: Able to state/look up THRR;Short Term: Able to use daily as guideline for intensity in rehab Long Term: Able to use THRR to govern intensity when exercising independently;Short Term: Able to state/look up THRR;Short Term: Able to use daily as guideline for intensity in rehab     Able to check pulse independently Yes Yes Yes     Intervention Provide education and demonstration on how to check pulse in carotid and radial arteries.;Review the importance of being able to check your own pulse for safety during independent exercise Provide education and demonstration on how to check pulse in carotid and radial arteries.;Review the importance of being able to check your own pulse for safety during independent exercise Provide education and demonstration on how to check pulse in carotid and radial arteries.;Review the importance of being able  to check your own pulse for safety during independent exercise     Expected Outcomes Short Term: Able to explain why pulse checking is important during independent exercise;Long Term: Able to check pulse independently and accurately Short Term: Able to  explain why pulse checking is important during independent exercise;Long Term: Able to check pulse independently and accurately Short Term: Able to explain why pulse checking is important during independent exercise;Long Term: Able to check pulse independently and accurately     Understanding of Exercise Prescription Yes Yes Yes     Intervention Provide education, explanation, and written materials on patient's individual exercise prescription Provide education, explanation, and written materials on patient's individual exercise prescription Provide education, explanation, and written materials on patient's individual exercise prescription     Expected Outcomes Short Term: Able to explain program exercise prescription;Long Term: Able to explain home exercise prescription to exercise independently Short Term: Able to explain program exercise prescription;Long Term: Able to explain home exercise prescription to exercise independently Short Term: Able to explain program exercise prescription;Long Term: Able to explain home exercise prescription to exercise independently              Exercise Goals Re-Evaluation:  Exercise Goals Re-Evaluation     Row Name 05/08/22 1031 06/05/22 1025           Exercise Goal Re-Evaluation   Exercise Goals Review Increase Physical Activity;Increase Strength and Stamina;Able to understand and use rate of perceived exertion (RPE) scale;Knowledge and understanding of Target Heart Rate Range (THRR);Able to check pulse independently;Understanding of Exercise Prescription Increase Physical Activity;Increase Strength and Stamina;Able to understand and use rate of perceived exertion (RPE) scale;Knowledge and understanding of Target Heart Rate Range (THRR);Able to check pulse independently;Understanding of Exercise Prescription      Comments Pt has completed 8 sessions of cardiac rehab. She is motivated in class and is starting to push herself to increase her wokrload. She is  currently exercising at 2.44 METs on the stepper. Will continue to monitor and progress as able. Pt has completed 18 sessions of cardiac rehab. She continues to be motivated during class and is increasing her workload. She is currently exercising at 3.08 METs on the treadmill. Will continue to monitor and progress as ablel.      Expected Outcomes Through exercise at home and at rehab, the patient will meet their stated goals. Through exercise at home and at rehab, the patient will meet their stated goals.               Nutrition & Weight - Outcomes:  Pre Biometrics - 04/11/22 0950       Pre Biometrics   Height '5\' 5"'  (1.651 m)    Weight 195 lb 8.8 oz (88.7 kg)    Waist Circumference 42 inches    Hip Circumference 43 inches    Waist to Hip Ratio 0.98 %    BMI (Calculated) 32.54    Triceps Skinfold 25 mm    % Body Fat 43 %    Grip Strength 18.8 kg    Flexibility 8 in    Single Leg Stand 3.48 seconds             Post Biometrics - 07/07/22 0951        Post  Biometrics   Height '5\' 5"'  (1.651 m)    Weight 197 lb 5 oz (89.5 kg)    Waist Circumference 43.5 inches    Hip Circumference 45 inches    Waist to Hip Ratio  0.97 %    BMI (Calculated) 32.83    Triceps Skinfold 24 mm    % Body Fat 43.5 %    Grip Strength 22.2 kg    Flexibility 6.75 in    Single Leg Stand 3.64 seconds             Nutrition:  Nutrition Therapy & Goals - 05/01/22 0904       Personal Nutrition Goals   Comments Patient scored 3 on her diet assessment. We offer 2 educational sessions on heart healthy nutrition with handouts.      Intervention Plan   Intervention Nutrition handout(s) given to patient.    Expected Outcomes Short Term Goal: Understand basic principles of dietary content, such as calories, fat, sodium, cholesterol and nutrients.             Nutrition Discharge:  Nutrition Assessments - 07/12/22 1346       MEDFICTS Scores   Pre Score 3    Post Score 15    Score Difference  12             Education Questionnaire Score:  Knowledge Questionnaire Score - 07/12/22 1345       Knowledge Questionnaire Score   Pre Score 15/24    Post Score 21/24             Goals reviewed with patient; copy given to patient. Pt graduated from CR after 24 sessions. She was able to improve her walk test distance by 29.2%, and her MET level at graduation was 3.95. She will continue to exercise at [employer], where she already has a membership.

## 2022-07-15 ENCOUNTER — Other Ambulatory Visit: Payer: Self-pay | Admitting: Surgical

## 2022-07-15 ENCOUNTER — Other Ambulatory Visit: Payer: Self-pay | Admitting: Family Medicine

## 2022-07-17 DIAGNOSIS — Z6833 Body mass index (BMI) 33.0-33.9, adult: Secondary | ICD-10-CM | POA: Diagnosis not present

## 2022-07-17 DIAGNOSIS — Z20828 Contact with and (suspected) exposure to other viral communicable diseases: Secondary | ICD-10-CM | POA: Diagnosis not present

## 2022-07-17 DIAGNOSIS — J329 Chronic sinusitis, unspecified: Secondary | ICD-10-CM | POA: Diagnosis not present

## 2022-07-17 DIAGNOSIS — R059 Cough, unspecified: Secondary | ICD-10-CM | POA: Diagnosis not present

## 2022-08-22 LAB — HEMOGLOBIN A1C: Hemoglobin A1C: 8

## 2022-09-12 ENCOUNTER — Ambulatory Visit: Payer: HMO | Admitting: Nurse Practitioner

## 2022-09-12 DIAGNOSIS — I10 Essential (primary) hypertension: Secondary | ICD-10-CM

## 2022-09-12 DIAGNOSIS — E119 Type 2 diabetes mellitus without complications: Secondary | ICD-10-CM

## 2022-09-12 DIAGNOSIS — E782 Mixed hyperlipidemia: Secondary | ICD-10-CM

## 2022-09-27 ENCOUNTER — Encounter: Payer: Self-pay | Admitting: Physician Assistant

## 2022-10-07 ENCOUNTER — Other Ambulatory Visit: Payer: Self-pay | Admitting: Cardiology

## 2022-10-18 ENCOUNTER — Encounter: Payer: Self-pay | Admitting: Cardiology

## 2022-10-18 ENCOUNTER — Ambulatory Visit: Payer: HMO | Attending: Cardiology | Admitting: Cardiology

## 2022-10-18 VITALS — BP 134/72 | HR 82 | Ht 65.0 in | Wt 195.0 lb

## 2022-10-18 DIAGNOSIS — Z952 Presence of prosthetic heart valve: Secondary | ICD-10-CM | POA: Diagnosis not present

## 2022-10-18 DIAGNOSIS — I10 Essential (primary) hypertension: Secondary | ICD-10-CM | POA: Diagnosis not present

## 2022-10-18 DIAGNOSIS — I350 Nonrheumatic aortic (valve) stenosis: Secondary | ICD-10-CM | POA: Diagnosis not present

## 2022-10-18 DIAGNOSIS — E782 Mixed hyperlipidemia: Secondary | ICD-10-CM | POA: Diagnosis not present

## 2022-10-18 MED ORDER — POTASSIUM CHLORIDE CRYS ER 20 MEQ PO TBCR
20.0000 meq | EXTENDED_RELEASE_TABLET | Freq: Every day | ORAL | 3 refills | Status: AC
Start: 2022-10-18 — End: ?

## 2022-10-18 NOTE — Progress Notes (Signed)
Clinical Summary Ms. Kihn is a 66 y.o.female seen today for follow up of the following medical problems.    1. Aortic stenosis s/p SAVR 01/2022 AVR surgery with 21 mm Edwards INSPIRIS RESILIA pericardial valve 02/2022 LVEF 60-65%, no WMAs, grade I dd, normal RV function. Normal AVR   -completed cardiac rehab - walking 3 miles twice a week -no SOB/DOE    2. HTN - compliant with meds - low bp's and dizziness at recent visits, meds have been cut back. Most recently we stopped HCTZ -she restarted  taking HCTZ 12.'5mg'$  daily,remains off losartan.       3. Carotid stenosis - 11/2020 carotid US: RICA 3-29%, LICA NL 06/2425 8-34% bilateral ICA disease   4. Hyperlipidemia - 03/2021 TC 165 TG 159 HDL 45 LDL 92 07/2022 TC 137 TG 130 HDL 46 LDL 68 Past Medical History:  Diagnosis Date   Acute respiratory failure with hypercapnia (HCC)    Allergy    Anxiety    Aortic stenosis    a. S/p AVR on 02/06/2022 using a 21 mm Edwards Inspiris Resilina pericardial valve   Asthma    Bronchitis with bronchospasm    Cancer ([place])    skin   Depression    DM type 2 (diabetes mellitus, type 2) ([place])    FATTY LIVER DISEASE 08/05/2008   GERD (gastroesophageal reflux disease)    Hyperlipidemia    Hypertension    Hypothyroidism    Kidney stones    Leukocytosis    Obesity, Class III, BMI 40-49.9 (morbid obesity) ([place])    PERSONAL HX COLONIC POLYPS 09/01/2008   PONV (postoperative nausea and vomiting)    Seasonal allergies    TRANSAMINASES, SERUM, ELEVATED 08/05/2008     Allergies  Allergen Reactions   Codeine Nausea Only   Sulfa Antibiotics Itching     Current Outpatient Medications  Medication Sig Dispense Refill   acetaminophen (TYLENOL) 325 MG tablet Take 2 tablets (650 mg total) by mouth every 6 (six) hours as needed for mild pain.     albuterol (VENTOLIN HFA) 108 (90 Base) MCG/ACT inhaler Inhale 2 puffs into the lungs every 6 (six) hours as needed for shortness of breath.      amoxicillin (AMOXIL) 500 MG capsule Take 500 mg by mouth 3 (three) times daily.     aspirin EC 81 MG tablet Take 81 mg by mouth daily. Swallow whole.     atorvastatin (LIPITOR) 20 MG tablet Take 1 tablet (20 mg total) by mouth daily. 90 tablet 1   azelastine (ASTELIN) 0.1 % nasal spray Place 1 spray into both nostrils 2 (two) times daily as needed for rhinitis or allergies. Use in each nostril as directed     cetirizine (ZYRTEC) 10 MG tablet Take 10 mg by mouth daily as needed for allergies.     ciclopirox (PENLAC) 8 % solution Apply topically at bedtime. Apply over nail and surrounding skin. Apply daily over previous coat. After seven (7) days, may remove with alcohol and continue cycle. (Patient taking differently: Apply 1 Application topically 3 (three) times a week. Apply over nail and surrounding skin. Apply daily over previous coat. After seven (7) days, may remove with alcohol and continue cycle.) 6.6 mL 2   citalopram (CELEXA) 40 MG tablet Take 1 tablet (40 mg total) by mouth daily. 90 tablet 1   Continuous Blood Gluc Receiver (FREESTYLE LIBRE 14 DAY READER) DEVI USE AS DIRECTED THREE TIMES DAILY. 1 each PRN   Continuous  Blood Gluc Sensor (FREESTYLE LIBRE 14 DAY SENSOR) MISC USE TO CHECK BLOOD SUGAR THREE TIMES DAILY WITH SLIDING SCALE INSULIN USE. 2 each 2   diclofenac (VOLTAREN) 75 MG EC tablet TAKE ONE TABLET BY MOUTH TWICE daily PRN pain. TAKE WITH FOOD. (Patient taking differently: Take 75 mg by mouth See admin instructions. Take 1 tablet (75 mg) by mouth (scheduled) every morning, may take an additional dose if needed for pain.) 28 tablet 5   Fluticasone-Salmeterol (ADVAIR DISKUS) 250-50 MCG/DOSE AEPB Inhale 1 puff into the lungs 2 (two) times daily. (Patient taking differently: Inhale 1 puff into the lungs 2 (two) times daily as needed (respiratory issues.).) 1 each 3   insulin glargine (LANTUS) 100 UNIT/ML Solostar Pen Inject 80 Units into the skin at bedtime. 45 mL 3   insulin lispro  (HUMALOG KWIKPEN) 100 UNIT/ML KwikPen Inject 5-11 Units into the skin 3 (three) times daily. 30 mL 3   levothyroxine (SYNTHROID) 175 MCG tablet Take 1 tablet (175 mcg total) by mouth daily. 90 tablet 1   losartan (COZAAR) 25 MG tablet Take 1 tablet (25 mg total) by mouth daily. 90 tablet 3   metFORMIN (GLUCOPHAGE) 1000 MG tablet Take 1 tablet (1,000 mg total) by mouth 2 (two) times daily with a meal. (Patient taking differently: Take 1,000 mg by mouth as needed.) 180 tablet 1   metoprolol tartrate (LOPRESSOR) 25 MG tablet TAKE ONE TABLET BY MOUTH TWICE DAILY 180 tablet 3   omeprazole (PRILOSEC) 40 MG capsule Take 1 capsule (40 mg total) by mouth daily. 90 capsule 1   ondansetron (ZOFRAN) 4 MG tablet Take 1 tablet (4 mg total) by mouth every 8 (eight) hours as needed for nausea or vomiting. 20 tablet 0   potassium chloride SA (KLOR-CON M) 20 MEQ tablet Take 1 tablet (20 mEq total) by mouth daily. 7 tablet 0   Semaglutide, 1 MG/DOSE, 4 MG/3ML SOPN Inject 1 mg as directed once a week. 3 mL 3   traMADol (ULTRAM) 50 MG tablet Take 1 tablet (50 mg total) by mouth every 6 (six) hours as needed. 30 tablet 0   No current facility-administered medications for this visit.     Past Surgical History:  Procedure Laterality Date   AORTIC VALVE REPLACEMENT N/A 02/06/2022   Procedure: AORTIC VALVE REPLACEMENT (AVR) WITH 21 MM INSIPIRIS AORTIC VALVE.;  Surgeon: Gaye Pollack, MD;  Location: [facility];  Service: Open Heart Surgery;  Laterality: N/A;   COLONOSCOPY  2013   Repeat 10 years   [place]   realignment   NOSE SURGERY  2007   sinus infections   POLYPECTOMY     RIGHT HEART CATH AND CORONARY ANGIOGRAPHY N/A 12/01/2021   Procedure: RIGHT HEART CATH AND CORONARY ANGIOGRAPHY;  Surgeon: Burnell Blanks, MD;  Location: [facility];  Service: Cardiovascular;  Laterality: N/A;   skin cancer removal     TEE WITHOUT CARDIOVERSION N/A 02/06/2022   Procedure: TRANSESOPHAGEAL  ECHOCARDIOGRAM (TEE);  Surgeon: Gaye Pollack, MD;  Location: [facility];  Service: Open Heart Surgery;  Laterality: N/A;   TONSILLECTOMY       Allergies  Allergen Reactions   Codeine Nausea Only   Sulfa Antibiotics Itching      Family History  Problem Relation Age of Onset   Cancer Mother    Hypertension Mother    Thyroid disease Mother    Breast cancer Mother    Diabetes Mother    Prostate cancer Father  Heart attack Maternal Grandmother    Allergies Daughter    Colon cancer Neg Hx    Esophageal cancer Neg Hx    Rectal cancer Neg Hx    Stomach cancer Neg Hx    Colon polyps Neg Hx      Social History Ms. Spickler reports that she quit smoking about 40 years ago. Her smoking use included cigarettes. She has a 2.50 pack-year smoking history. She has never used smokeless tobacco. Ms. Schlie reports no history of alcohol use.   Review of Systems CONSTITUTIONAL: No weight loss, fever, chills, weakness or fatigue.  HEENT: Eyes: No visual loss, blurred vision, double vision or yellow sclerae.No hearing loss, sneezing, congestion, runny nose or sore throat.  SKIN: No rash or itching.  CARDIOVASCULAR: per hpi RESPIRATORY: No shortness of breath, cough or sputum.  GASTROINTESTINAL: No anorexia, nausea, vomiting or diarrhea. No abdominal pain or blood.  GENITOURINARY: No burning on urination, no polyuria NEUROLOGICAL: No headache, dizziness, syncope, paralysis, ataxia, numbness or tingling in the extremities. No change in bowel or bladder control.  MUSCULOSKELETAL: No muscle, back pain, joint pain or stiffness.  LYMPHATICS: No enlarged nodes. No history of splenectomy.  PSYCHIATRIC: No history of depression or anxiety.  ENDOCRINOLOGIC: No reports of sweating, cold or heat intolerance. No polyuria or polydipsia.  Marland Kitchen   Physical Examination Today's Vitals   10/18/22 0856  BP: 134/72  Pulse: 82  SpO2: 98%  Weight: 195 lb (88.5 kg)  Height: '5\' 5"'$  (1.651 m)  PainSc: 0-No pain    Body mass index is 32.45 kg/m.  Gen: resting comfortably, no acute distress HEENT: no scleral icterus, pupils equal round and reactive, no palptable cervical adenopathy,  CV: RRR, 2/6 systolic murmur rusb, bilateral carotid bruits Resp: Clear to auscultation bilaterally GI: abdomen is soft, non-tender, non-distended, normal bowel sounds, no hepatosplenomegaly MSK: extremities are warm, no edema.  Skin: warm, no rash Neuro:  no focal deficits Psych: appropriate affect   Diagnostic Studies  05/2020 echo IMPRESSIONS     1. Left ventricular ejection fraction, by estimation, is 65 to 70%. The  left ventricle has normal function. The left ventricle has no regional  wall motion abnormalities. There is mild left ventricular hypertrophy.  Left ventricular diastolic parameters  are consistent with Grade I diastolic dysfunction (impaired relaxation).   2. Right ventricular systolic function is normal. The right ventricular  size is normal. There is normal pulmonary artery systolic pressure. The  estimated right ventricular systolic pressure is 52.7 mmHg.   3. The mitral valve is grossly normal. Trivial mitral valve  regurgitation.   4. The aortic valve is tricuspid, moderately calcified with decreased  cusp excursion. Aortic valve regurgitation is trivial. Moderate to severe  aortic valve stenosis. Aortic valve area, by VTI measures 0.77 cm. Aortic  valve mean gradient measures 29.5  mmHg. Aortic valve Vmax measures 3.64 m/s. Dimentionless index 0.30.   5. The inferior vena cava is normal in size with greater than 50%  respiratory variability, suggesting right atrial pressure of 3 mmHg.   11/2020 echo IMPRESSIONS     1. Left ventricular ejection fraction, by estimation, is 55 to 60%. The  left ventricle has normal function. The left ventricle has no regional  wall motion abnormalities. Left ventricular diastolic parameters are  consistent with Grade I diastolic  dysfunction  (impaired relaxation). Elevated left atrial pressure.   2. Right ventricular systolic function is normal. The right ventricular  size is normal.   3. The mitral valve  is normal in structure. No evidence of mitral valve  regurgitation. No evidence of mitral stenosis.   4. The aortic valve is tricuspid. There is mild calcification of the  aortic valve. There is mild thickening of the aortic valve. Aortic valve  regurgitation is not visualized. Moderate to severe aortic valve stenosis.  Aortic valve mean gradient measures  29.6 mmHg. Aortic valve peak gradient measures 49.4 mmHg. Aortic valve  area, by VTI measures 0.92 cm.      09/2021 echo IMPRESSIONS     1. Left ventricular ejection fraction, by estimation, is 55 to 60%. The  left ventricle has normal function. The left ventricle has no regional  wall motion abnormalities. There is mild left ventricular hypertrophy.  Left ventricular diastolic parameters  are consistent with Grade I diastolic dysfunction (impaired relaxation).   2. Right ventricular systolic function is normal. The right ventricular  size is normal. There is normal pulmonary artery systolic pressure.   3. The mitral valve is abnormal. No evidence of mitral valve  regurgitation. No evidence of mitral stenosis. Moderate mitral annular  calcification.   4. Morphologically tri leaflet vavle heavily calcified. Gradients stable  since February 2022 but smaller LVOT diameter used DVI/AVA and peak  velocity from suprasternal notch ( 4 m/sec ) as well as morphology suggest  severe AS despite mean gradient of  28.7 mmHhg Suggest right and left cath to r/o CAD and further evaluate .  The aortic valve is tricuspid. There is severe calcifcation of the aortic  valve. There is severe thickening of the aortic valve. Aortic valve  regurgitation is trivial. Moderate to  severe aortic valve stenosis.   5. The inferior vena cava is normal in size with greater than 50%  respiratory  variability, suggesting right atrial pressure of 3 mmHg.   11/2021 cath No angiographic evidence of CAD Normal right heart pressures   Continue workup for AVR. Given her young age and low surgical risk, she may be better served with surgical AVR than TAVR. Will proceed with CT scans and will have her seen by Dr. Cyndia Bent.   Assessment and Plan   1. Aortic stenosis s/p AVR - s/p SAVR with tissue valve - f/u echo shows normal functioning AVR - no symptoms, continue to monitor. Continue ASA '81mg'$  daily,    2. HTN - at goal, continue current meds  3. Hyperlipidemia - at goal, continue current meds   F/u 6 months  Arnoldo Lenis, MD

## 2022-10-18 NOTE — Patient Instructions (Addendum)

## 2022-10-24 ENCOUNTER — Other Ambulatory Visit: Payer: Self-pay | Admitting: Nurse Practitioner

## 2022-11-01 ENCOUNTER — Ambulatory Visit (INDEPENDENT_AMBULATORY_CARE_PROVIDER_SITE_OTHER): Payer: PPO | Admitting: Nurse Practitioner

## 2022-11-01 ENCOUNTER — Encounter: Payer: Self-pay | Admitting: Nurse Practitioner

## 2022-11-01 VITALS — BP 107/64 | HR 82 | Ht 65.0 in | Wt 196.6 lb

## 2022-11-01 DIAGNOSIS — E782 Mixed hyperlipidemia: Secondary | ICD-10-CM | POA: Diagnosis not present

## 2022-11-01 DIAGNOSIS — E119 Type 2 diabetes mellitus without complications: Secondary | ICD-10-CM

## 2022-11-01 DIAGNOSIS — Z794 Long term (current) use of insulin: Secondary | ICD-10-CM

## 2022-11-01 DIAGNOSIS — I10 Essential (primary) hypertension: Secondary | ICD-10-CM | POA: Diagnosis not present

## 2022-11-01 DIAGNOSIS — E039 Hypothyroidism, unspecified: Secondary | ICD-10-CM

## 2022-11-01 NOTE — Progress Notes (Signed)
Endocrinology Follow Up Note       11/01/2022, 1:58 PM   Subjective:    Patient ID: Sarah Pope, female    DOB: Apr 14, 1956.  Sarah Pope is being seen in follow up after being seen in consultation for management of currently uncontrolled symptomatic diabetes requested by  [place], MD.   Past Medical History:  Diagnosis Date   Acute respiratory failure with hypercapnia ([place])    Allergy    Anxiety    Aortic stenosis    a. S/p AVR on 02/06/2022 using a 21 mm Edwards Inspiris Resilina pericardial valve   Asthma    Bronchitis with bronchospasm    Cancer ([place])    skin   Depression    DM type 2 (diabetes mellitus, type 2) ([place])    FATTY LIVER DISEASE 08/05/2008   GERD (gastroesophageal reflux disease)    Hyperlipidemia    Hypertension    Hypothyroidism    Kidney stones    Leukocytosis    Obesity, Class III, BMI 40-49.9 (morbid obesity) ([place])    PERSONAL HX COLONIC POLYPS 09/01/2008   PONV (postoperative nausea and vomiting)    Seasonal allergies    TRANSAMINASES, SERUM, ELEVATED 08/05/2008    Past Surgical History:  Procedure Laterality Date   AORTIC VALVE REPLACEMENT N/A 02/06/2022   Procedure: AORTIC VALVE REPLACEMENT (AVR) WITH 21 MM INSIPIRIS AORTIC VALVE.;  Surgeon: Gaye Pollack, MD;  Location: [facility];  Service: Open Heart Surgery;  Laterality: N/A;   COLONOSCOPY  2013   Repeat 10 years   [place]   realignment   NOSE SURGERY  2007   sinus infections   POLYPECTOMY     RIGHT HEART CATH AND CORONARY ANGIOGRAPHY N/A 12/01/2021   Procedure: RIGHT HEART CATH AND CORONARY ANGIOGRAPHY;  Surgeon: Burnell Blanks, MD;  Location: [facility];  Service: Cardiovascular;  Laterality: N/A;   skin cancer removal     TEE WITHOUT CARDIOVERSION N/A 02/06/2022   Procedure: TRANSESOPHAGEAL ECHOCARDIOGRAM (TEE);  Surgeon: Gaye Pollack, MD;  Location: [facility];  Service:  Open Heart Surgery;  Laterality: N/A;   TONSILLECTOMY      Social History   Socioeconomic History   Marital status: Divorced    Spouse name: Not on file   Number of children: Not on file   Years of education: Not on file   Highest education level: Not on file  Occupational History   Occupation: Lead Clerk at [employer]   Smoking status: Former    Packs/day: 0.50    Years: 5.00    Total pack years: 2.50    Types: Cigarettes    Quit date: 10/23/1982    Years since quitting: 40.0   Smokeless tobacco: Never  Vaping Use   Vaping Use: Never used  Substance and Sexual Activity   Alcohol use: No   Drug use: No   Sexual activity: Not on file  Other Topics Concern   Not on file  Social History Narrative   Not on file   Social Determinants of Health   Financial Resource Strain: Not on file  Food Insecurity: Not on file  Transportation Needs: Not on file  Physical Activity: Not on file  Stress: Not on file  Social Connections: Not on file    Family History  Problem Relation Age of Onset   Cancer Mother    Hypertension Mother    Thyroid disease Mother    Breast cancer Mother    Diabetes Mother    Prostate cancer Father    Heart attack Maternal Grandmother    Allergies Daughter    Colon cancer Neg Hx    Esophageal cancer Neg Hx    Rectal cancer Neg Hx    Stomach cancer Neg Hx    Colon polyps Neg Hx     Outpatient Encounter Medications as of 11/01/2022  Medication Sig   albuterol (VENTOLIN HFA) 108 (90 Base) MCG/ACT inhaler Inhale 2 puffs into the lungs every 6 (six) hours as needed for shortness of breath.   aspirin EC 81 MG tablet Take 81 mg by mouth daily. Swallow whole.   atorvastatin (LIPITOR) 20 MG tablet Take 1 tablet (20 mg total) by mouth daily.   citalopram (CELEXA) 40 MG tablet Take 1 tablet (40 mg total) by mouth daily.   Continuous Blood Gluc Receiver (FREESTYLE LIBRE 14 DAY READER) DEVI USE AS DIRECTED THREE TIMES DAILY.   Continuous  Blood Gluc Sensor (FREESTYLE LIBRE 14 DAY SENSOR) MISC USE TO CHECK BLOOD SUGAR THREE TIMES DAILY WITH SLIDING SCALE INSULIN USE   doxycycline (VIBRA-TABS) 100 MG tablet Take 100 mg by mouth 2 (two) times daily.   Fluticasone-Salmeterol (ADVAIR DISKUS) 250-50 MCG/DOSE AEPB Inhale 1 puff into the lungs 2 (two) times daily. (Patient taking differently: Inhale 1 puff into the lungs 2 (two) times daily as needed (respiratory issues.).)   hydrochlorothiazide (HYDRODIURIL) 12.5 MG tablet Take 12.5 mg by mouth daily.   insulin glargine (LANTUS) 100 UNIT/ML Solostar Pen Inject 80 Units into the skin at bedtime.   insulin lispro (HUMALOG KWIKPEN) 100 UNIT/ML KwikPen Inject 5-11 Units into the skin 3 (three) times daily.   levothyroxine (SYNTHROID) 175 MCG tablet Take 1 tablet (175 mcg total) by mouth daily.   metFORMIN (GLUCOPHAGE) 1000 MG tablet Take 1 tablet (1,000 mg total) by mouth 2 (two) times daily with a meal. (Patient taking differently: Take 1,000 mg by mouth as needed.)   metoprolol tartrate (LOPRESSOR) 25 MG tablet TAKE ONE TABLET BY MOUTH TWICE DAILY   potassium chloride SA (KLOR-CON M) 20 MEQ tablet Take 1 tablet (20 mEq total) by mouth daily.   Semaglutide, 1 MG/DOSE, 4 MG/3ML SOPN Inject 1 mg as directed once a week.   acetaminophen (TYLENOL) 325 MG tablet Take 2 tablets (650 mg total) by mouth every 6 (six) hours as needed for mild pain. (Patient not taking: Reported on 10/18/2022)   No facility-administered encounter medications on file as of 11/01/2022.    ALLERGIES: Allergies  Allergen Reactions   Codeine Nausea Only   Sulfa Antibiotics Itching    VACCINATION STATUS: Immunization History  Administered Date(s) Administered   Influenza Split 07/03/2012, 09/10/2013   Influenza,inj,Quad PF,6+ Mos 09/29/2015, 06/27/2016, 08/15/2017, 08/28/2018, 07/14/2019, 08/09/2020   Influenza-Unspecified 08/24/2011, 08/11/2020   Moderna Sars-Covid-2 Vaccination 04/05/2020, 04/28/2020    PNEUMOCOCCAL CONJUGATE-20 02/11/2022   Pneumococcal Polysaccharide-23 08/23/2004, 08/28/2018   Td 06/17/2008   Tdap 05/09/2019   Zoster Recombinat (Shingrix) 10/14/2019, 08/04/2020, 08/11/2020    Diabetes She presents for her follow-up diabetic visit. She has type 2 diabetes mellitus. Onset time: Diagnosed at approx age of 5. Her disease course has been improving. There are  no hypoglycemic associated symptoms. Associated symptoms include fatigue and foot paresthesias. Pertinent negatives for diabetes include no polydipsia, no polyuria and no weight loss. There are no hypoglycemic complications. Symptoms are stable. Diabetic complications include nephropathy and peripheral neuropathy. Risk factors for coronary artery disease include stress, diabetes mellitus, dyslipidemia, family history, obesity, hypertension, post-menopausal and sedentary lifestyle. Current diabetic treatment includes intensive insulin program and oral agent (monotherapy). She is compliant with treatment most of the time. Her weight is fluctuating minimally. She is following a generally unhealthy diet. When asked about meal planning, she reported none. She has not had a previous visit with a dietitian. She rarely participates in exercise. (She presents today with her CGM showing improving glycemic profile overall.  She was not due for another A1c, was done on 10/31 at PCP office and was 8%.  She notes she has had several teeth pulled, is waiting on dentures, therefore her eating has not been optimal.  She has has multiple episodes of hypoglycemia but her CGM does not alert as it is an outdated product.) An ACE inhibitor/angiotensin II receptor blocker is being taken. She sees a podiatrist.Eye exam is not current.  Hyperlipidemia This is a chronic problem. The current episode started more than 1 year ago. The problem is uncontrolled. Recent lipid tests were reviewed and are variable. Exacerbating diseases include chronic renal disease,  diabetes, hypothyroidism and obesity. Factors aggravating her hyperlipidemia include thiazides and fatty foods. Current antihyperlipidemic treatment includes statins. The current treatment provides moderate improvement of lipids. Compliance problems include adherence to diet and adherence to exercise.  Risk factors for coronary artery disease include diabetes mellitus, dyslipidemia, family history, hypertension, obesity, post-menopausal, a sedentary lifestyle and stress.  Hypertension This is a chronic problem. The current episode started more than 1 year ago. The problem has been resolved since onset. The problem is controlled. Agents associated with hypertension include thyroid hormones. Risk factors for coronary artery disease include diabetes mellitus, dyslipidemia, family history, post-menopausal state, sedentary lifestyle, stress and obesity. Past treatments include angiotensin blockers and diuretics. The current treatment provides significant improvement. Compliance problems include exercise and diet.  Hypertensive end-organ damage includes kidney disease. Identifiable causes of hypertension include chronic renal disease and sleep apnea.     Review of systems  Constitutional: + stable body weight, current Body mass index is 32.72 kg/m., + fatigue, no subjective hyperthermia, no subjective hypothermia, decreased appetite- recently had teeth pulled Eyes: no blurry vision, no xerophthalmia ENT: no sore throat, no nodules palpated in throat, no dysphagia/odynophagia, no hoarseness Cardiovascular: no chest pain, no shortness of breath, no palpitations, + intermittent leg swelling Respiratory: no cough, no shortness of breath Gastrointestinal: no nausea/vomiting/diarrhea Musculoskeletal: no muscle/joint aches Skin: no rashes, no hyperemia Neurological: + resting tremors, + numbness/tingling/burning pain to bilateral feet, no dizziness Psychiatric: no depression, no anxiety  Objective:     BP  107/64 (BP Location: Right Arm, Patient Position: Sitting, Cuff Size: Large)   Pulse 82   Ht '5\' 5"'$  (1.651 m)   Wt 196 lb 9.6 oz (89.2 kg)   BMI 32.72 kg/m   Wt Readings from Last 3 Encounters:  11/01/22 196 lb 9.6 oz (89.2 kg)  10/18/22 195 lb (88.5 kg)  07/07/22 197 lb 5 oz (89.5 kg)     BP Readings from Last 3 Encounters:  11/01/22 107/64  10/18/22 134/72  06/08/22 111/69     Physical Exam- Limited  Constitutional:  Body mass index is 32.72 kg/m. , not in acute distress, normal state  of mind Eyes:  EOMI, no exophthalmos Musculoskeletal: no gross deformities, strength intact in all four extremities, no gross restriction of joint movements Skin:  no rashes, no hyperemia Neurological: mild resting tremor   Diabetic Foot Exam - Simple   No data filed     CMP ( most recent) CMP     Component Value Date/Time   NA 137 02/09/2022 0245   NA 141 04/18/2021 0943   K 4.3 02/09/2022 0245   CL 101 02/09/2022 0245   CO2 31 02/09/2022 0245   GLUCOSE 109 (H) 02/09/2022 0245   BUN 13 02/09/2022 0245   BUN 14 04/18/2021 0943   CREATININE 0.74 02/09/2022 0245   CREATININE 0.64 02/05/2013 1148   CALCIUM 8.2 (L) 02/09/2022 0245   PROT 6.6 02/02/2022 1450   PROT 6.8 04/18/2021 0943   ALBUMIN 3.7 02/02/2022 1450   ALBUMIN 4.4 04/18/2021 0943   AST 20 02/02/2022 1450   ALT 17 02/02/2022 1450   ALKPHOS 69 02/02/2022 1450   BILITOT 1.2 02/02/2022 1450   BILITOT 0.8 04/18/2021 0943   GFRNONAA >60 02/09/2022 0245   GFRAA >60 05/31/2020 0930     Diabetic Labs (most recent): Lab Results  Component Value Date   HGBA1C 7.7 06/08/2022   HGBA1C 8.6 (H) 02/02/2022   HGBA1C 9.0 (H) 10/25/2021   MICROALBUR 1.20 02/05/2013     Lipid Panel ( most recent) Lipid Panel     Component Value Date/Time   CHOL 165 04/18/2021 0943   TRIG 159 (H) 04/18/2021 0943   HDL 45 04/18/2021 0943   CHOLHDL 3.7 04/18/2021 0943   CHOLHDL 3.4 12/16/2019 0905   VLDL 22 12/16/2019 0905   LDLCALC 92  04/18/2021 0943   LABVLDL 28 04/18/2021 0943      Lab Results  Component Value Date   TSH 5.150 (H) 04/18/2021   TSH 1.503 12/16/2019   TSH 1.360 07/01/2019   TSH 2.480 04/01/2018   TSH 2.650 10/03/2016   TSH 3.620 10/06/2015   TSH 4.248 02/05/2013   TSH 3.189 06/30/2012           Assessment & Plan:   1) Type 2 diabetes mellitus without complication, with long-term current use of insulin ([place])  She presents today with her CGM showing improving glycemic profile overall.  She was not due for another A1c, was done on 10/31 at PCP office and was 8%.  She notes she has had several teeth pulled, is waiting on dentures, therefore her eating has not been optimal.  She has has multiple episodes of hypoglycemia but her CGM does not alert as it is an outdated product.  - Sarah Pope has currently uncontrolled symptomatic type 2 DM since 67 years of age.  -Recent labs reviewed.  - I had a long discussion with her about the progressive nature of diabetes and the pathology behind its complications. -her diabetes is complicated by mild CKD, neuropathy and she remains at a high risk for more acute and chronic complications which include CAD, CVA, CKD, retinopathy, and neuropathy. These are all discussed in detail with her.  - Nutritional counseling repeated at each appointment due to patients tendency to fall back in to old habits.  - The patient admits there is a room for improvement in their diet and drink choices. -  Suggestion is made for the patient to avoid simple carbohydrates from their diet including Cakes, Sweet Desserts / Pastries, Ice Cream, Soda (diet and regular), Sweet Tea, Candies, Chips, Cookies, Sweet Pastries, Store  Bought Juices, Alcohol in Excess of 1-2 drinks a day, Artificial Sweeteners, Coffee Creamer, and "Sugar-free" Products. This will help patient to have stable blood glucose profile and potentially avoid unintended weight gain.   - I encouraged the patient to  switch to unprocessed or minimally processed complex starch and increased protein intake (animal or plant source), fruits, and vegetables.   - Patient is advised to stick to a routine mealtimes to eat 3 meals a day and avoid unnecessary snacks (to snack only to correct hypoglycemia).  - I have approached her with the following individualized plan to manage her diabetes and patient agrees:   -She is advised to continue Lantus 80 units SQ nightly, lower her Humalog 5-11 (in hopes she will be more consistent with taking it) units TID with meals if glucose is above 90 and she is eating (Specific instructions on how to titrate insulin dosage based on glucose readings given to patient in writing), Metformin 1000 mg po twice daily with meals, and Ozempic 1 mg SQ weekly.  -she is encouraged to start consistently using her CGM to monitor glucose 4 times daily, before meals and before bed, and to call the clinic if she has readings less than 70 or above 300 for 3 tests in a row.  She promises to do better with this moving forward.  I did discuss upgrading her device to a libre 3 and she is in agreement.  I sent Rx to [facility] for fulfillment.  - she is warned not to take insulin without proper monitoring per orders. - Adjustment parameters are given to her for hypo and hyperglycemia in writing.  -She has allergy to sulfa meds.  - Specific targets for  A1c; LDL, HDL, and Triglycerides were discussed with the patient.  2) Blood Pressure /Hypertension:  her blood pressure is controlled to target.   she is advised to continue her current medications including Metoprolol 25 po daily and Losartan 25 mg po daily.  She has follow up with her cardiologist today.  3) Lipids/Hyperlipidemia:    Review of her recent lipid panel from 08/22/22 showed controlled LDL at 68.  she is advised to continue Lipitor 20 mg daily at bedtime.  Side effects and precautions discussed with her.    4)  Weight/Diet:  her Body mass  index is 32.72 kg/m.  -  clearly complicating her diabetes care.   she is a candidate for weight loss. I discussed with her the fact that loss of 5 - 10% of her  current body weight will have the most impact on her diabetes management.  Exercise, and detailed carbohydrates information provided  -  detailed on discharge instructions.  5) Chronic Care/Health Maintenance: -she is on ACEI/ARB and Statin medications and is encouraged to initiate and continue to follow up with Ophthalmology, Dentist, Podiatrist at least yearly or according to recommendations, and advised to stay away from smoking. I have recommended yearly flu vaccine and pneumonia vaccine at least every 5 years; moderate intensity exercise for up to 150 minutes weekly; and sleep for at least 7 hours a day.  - she is advised to maintain close follow up with [place], MD for primary care needs, as well as her other providers for optimal and coordinated care.      I spent 36 minutes in the care of the patient today including review of labs from [facility], Lipids, Thyroid Function, Hematology (current and previous including abstractions from other facilities); face-to-face time discussing  her blood  glucose readings/logs, discussing hypoglycemia and hyperglycemia episodes and symptoms, medications doses, her options of short and long term treatment based on the latest standards of care / guidelines;  discussion about incorporating lifestyle medicine;  and documenting the encounter. Risk reduction counseling performed per USPSTF guidelines to reduce obesity and cardiovascular risk factors.     Please refer to Patient Instructions for Blood Glucose Monitoring and Insulin/Medications Dosing Guide"  in media tab for additional information. Please  also refer to " Patient Self Inventory" in the Media  tab for reviewed elements of pertinent patient history.  [place] participated in the discussions, expressed understanding, and voiced  agreement with the above plans.  All questions were answered to her satisfaction. she is encouraged to contact clinic should she have any questions or concerns prior to her return visit.     Follow up plan: - Return in about 3 months (around 01/31/2023) for Diabetes F/U with A1c in office, No previsit labs, Bring meter and logs.   Rayetta Pigg, [facility] [address] [place], [place] [address] Phone: [phone] Fax: [phone]  11/01/2022, 1:58 PM

## 2022-11-03 DIAGNOSIS — E08649 Diabetes mellitus due to underlying condition with hypoglycemia without coma: Secondary | ICD-10-CM | POA: Diagnosis not present

## 2022-11-17 ENCOUNTER — Other Ambulatory Visit (HOSPITAL_COMMUNITY): Payer: Self-pay

## 2022-11-18 ENCOUNTER — Other Ambulatory Visit (HOSPITAL_COMMUNITY): Payer: Self-pay

## 2022-11-21 DIAGNOSIS — E039 Hypothyroidism, unspecified: Secondary | ICD-10-CM | POA: Diagnosis not present

## 2022-11-22 DIAGNOSIS — Z6833 Body mass index (BMI) 33.0-33.9, adult: Secondary | ICD-10-CM | POA: Diagnosis not present

## 2022-11-22 DIAGNOSIS — R030 Elevated blood-pressure reading, without diagnosis of hypertension: Secondary | ICD-10-CM | POA: Diagnosis not present

## 2022-11-22 DIAGNOSIS — M79646 Pain in unspecified finger(s): Secondary | ICD-10-CM | POA: Diagnosis not present

## 2022-11-30 ENCOUNTER — Other Ambulatory Visit (HOSPITAL_COMMUNITY): Payer: Self-pay

## 2022-12-04 DIAGNOSIS — E08649 Diabetes mellitus due to underlying condition with hypoglycemia without coma: Secondary | ICD-10-CM | POA: Diagnosis not present

## 2022-12-04 IMAGING — CR DG CHEST 2V
2 series · 2 of 2 positions shown · non-contrast
Comparison: Chest x-ray February 09, 2022.

CLINICAL DATA: s/p avr

EXAM:
CHEST - 2 VIEW

[w chest pa]
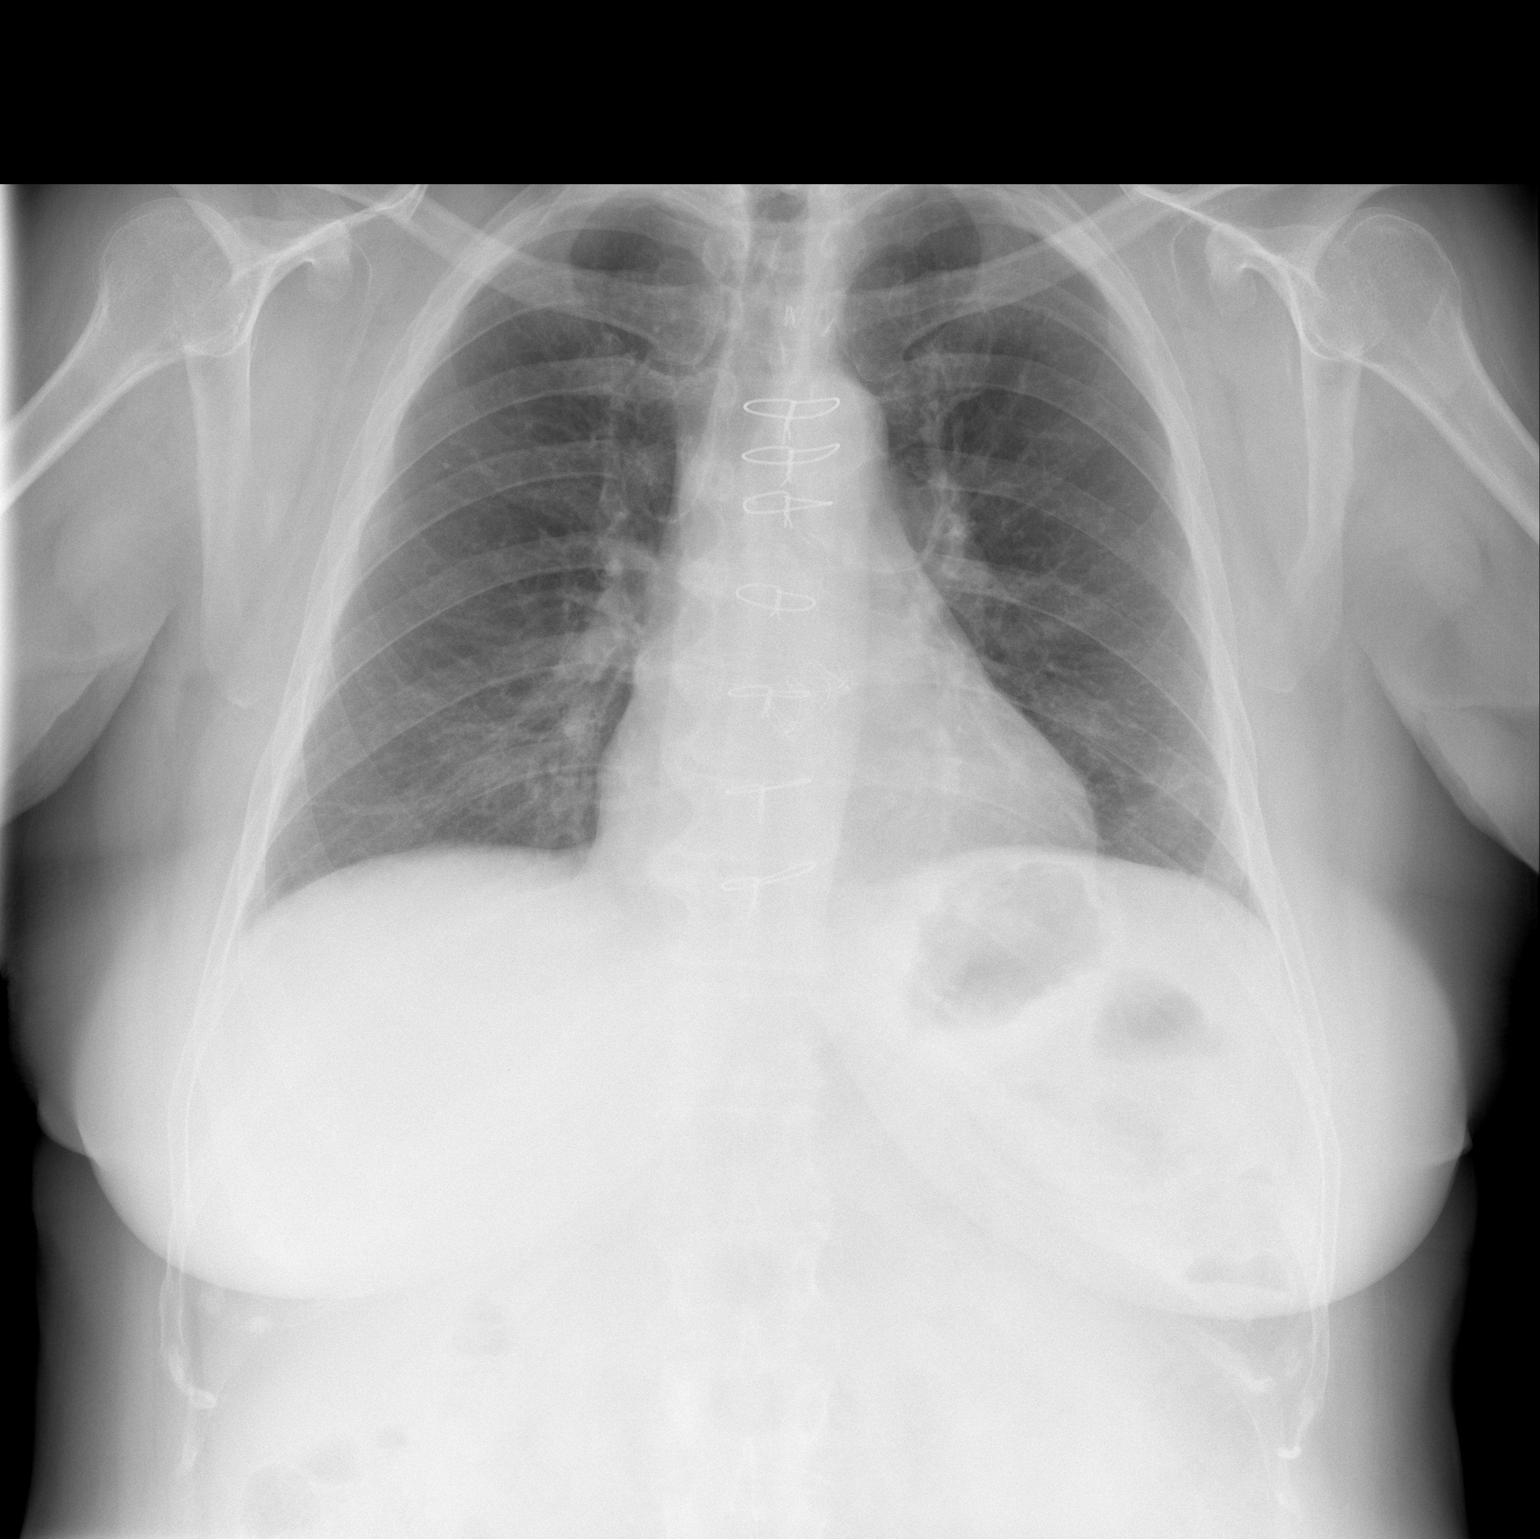

[w chest lat]
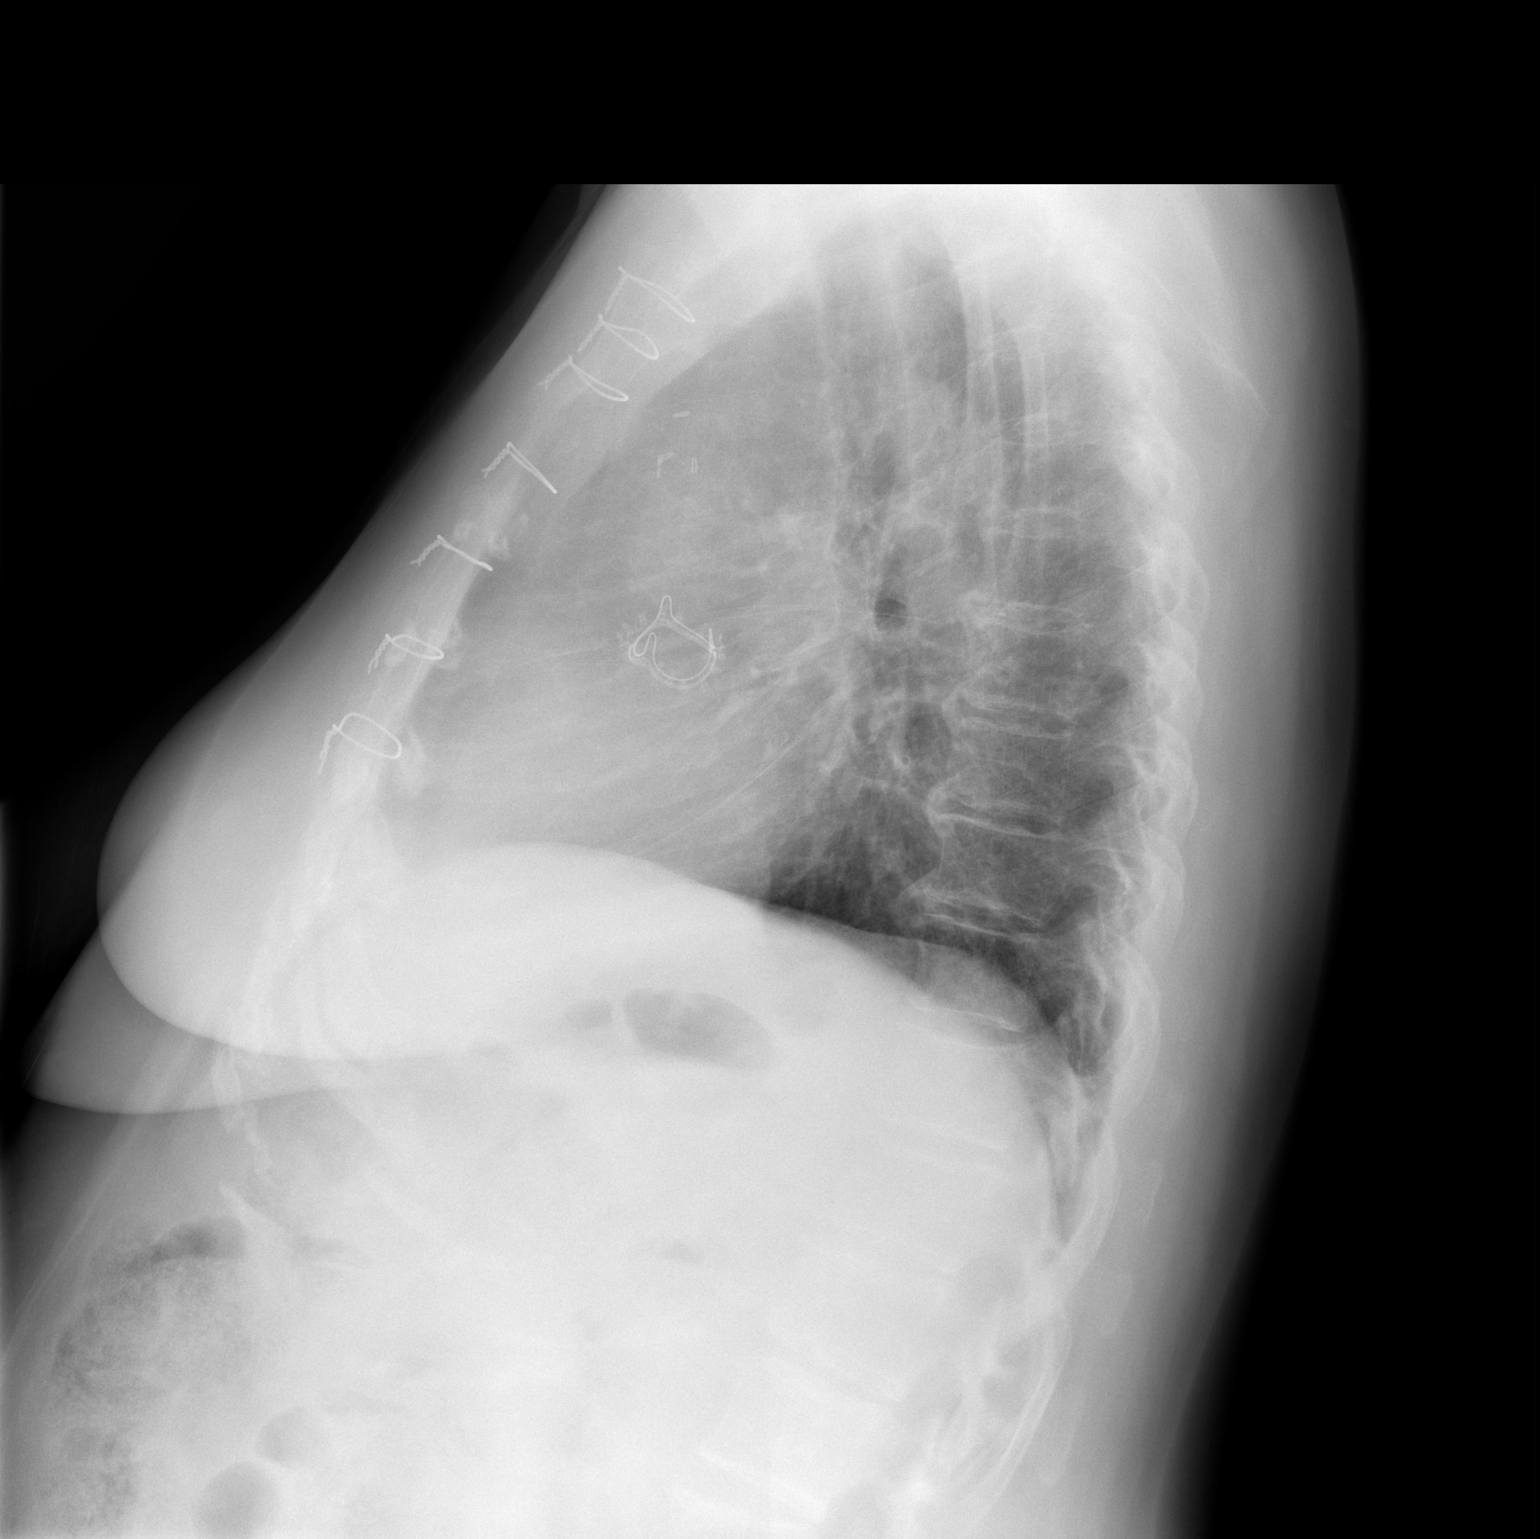

[2 of 2 positions shown; findings below may reference images not displayed]

FINDINGS: No consolidation. No visible pleural effusions or pneumothorax.
Cardiac silhouette is within normal limits. Prosthetic aortic valve.
Median sternotomy. Spine degenerative change.
IMPRESSION: No evidence of acute cardiopulmonary disease.

## 2023-01-02 DIAGNOSIS — E08649 Diabetes mellitus due to underlying condition with hypoglycemia without coma: Secondary | ICD-10-CM | POA: Diagnosis not present

## 2023-01-11 DIAGNOSIS — J329 Chronic sinusitis, unspecified: Secondary | ICD-10-CM | POA: Diagnosis not present

## 2023-01-11 DIAGNOSIS — Z6833 Body mass index (BMI) 33.0-33.9, adult: Secondary | ICD-10-CM | POA: Diagnosis not present

## 2023-01-11 DIAGNOSIS — J40 Bronchitis, not specified as acute or chronic: Secondary | ICD-10-CM | POA: Diagnosis not present

## 2023-01-11 DIAGNOSIS — R030 Elevated blood-pressure reading, without diagnosis of hypertension: Secondary | ICD-10-CM | POA: Diagnosis not present

## 2023-01-11 DIAGNOSIS — Z20828 Contact with and (suspected) exposure to other viral communicable diseases: Secondary | ICD-10-CM | POA: Diagnosis not present

## 2023-01-22 DIAGNOSIS — H43813 Vitreous degeneration, bilateral: Secondary | ICD-10-CM | POA: Diagnosis not present

## 2023-01-22 DIAGNOSIS — H01002 Unspecified blepharitis right lower eyelid: Secondary | ICD-10-CM | POA: Diagnosis not present

## 2023-01-22 DIAGNOSIS — H01001 Unspecified blepharitis right upper eyelid: Secondary | ICD-10-CM | POA: Diagnosis not present

## 2023-01-22 DIAGNOSIS — H25813 Combined forms of age-related cataract, bilateral: Secondary | ICD-10-CM | POA: Diagnosis not present

## 2023-01-24 DIAGNOSIS — R030 Elevated blood-pressure reading, without diagnosis of hypertension: Secondary | ICD-10-CM | POA: Diagnosis not present

## 2023-01-24 DIAGNOSIS — R251 Tremor, unspecified: Secondary | ICD-10-CM | POA: Diagnosis not present

## 2023-01-24 DIAGNOSIS — M65341 Trigger finger, right ring finger: Secondary | ICD-10-CM | POA: Diagnosis not present

## 2023-01-24 DIAGNOSIS — E1169 Type 2 diabetes mellitus with other specified complication: Secondary | ICD-10-CM | POA: Diagnosis not present

## 2023-01-24 DIAGNOSIS — Z6834 Body mass index (BMI) 34.0-34.9, adult: Secondary | ICD-10-CM | POA: Diagnosis not present

## 2023-01-31 ENCOUNTER — Ambulatory Visit (INDEPENDENT_AMBULATORY_CARE_PROVIDER_SITE_OTHER): Payer: PPO | Admitting: Nurse Practitioner

## 2023-01-31 ENCOUNTER — Encounter: Payer: Self-pay | Admitting: Nurse Practitioner

## 2023-01-31 ENCOUNTER — Ambulatory Visit: Payer: HMO | Admitting: Nurse Practitioner

## 2023-01-31 VITALS — BP 118/70 | HR 79 | Ht 65.0 in | Wt 203.4 lb

## 2023-01-31 DIAGNOSIS — E039 Hypothyroidism, unspecified: Secondary | ICD-10-CM | POA: Diagnosis not present

## 2023-01-31 DIAGNOSIS — I10 Essential (primary) hypertension: Secondary | ICD-10-CM

## 2023-01-31 DIAGNOSIS — E119 Type 2 diabetes mellitus without complications: Secondary | ICD-10-CM | POA: Diagnosis not present

## 2023-01-31 DIAGNOSIS — Z794 Long term (current) use of insulin: Secondary | ICD-10-CM | POA: Diagnosis not present

## 2023-01-31 DIAGNOSIS — E782 Mixed hyperlipidemia: Secondary | ICD-10-CM

## 2023-01-31 LAB — POCT GLYCOSYLATED HEMOGLOBIN (HGB A1C): Hemoglobin A1C: 8.7 % — AB (ref 4.0–5.6)

## 2023-01-31 MED ORDER — SEMAGLUTIDE (2 MG/DOSE) 8 MG/3ML ~~LOC~~ SOPN: 2.0000 mg | PEN_INJECTOR | SUBCUTANEOUS | 3 refills | Status: DC

## 2023-01-31 NOTE — Patient Instructions (Signed)

## 2023-01-31 NOTE — Progress Notes (Signed)
Endocrinology Follow Up Note       01/31/2023, 1:45 PM   Subjective:    Patient ID: Sarah Pope, female    DOB: Oct 14, 1956.  Sarah Pope is being seen in follow up after being seen in consultation for management of currently uncontrolled symptomatic diabetes requested by  Donetta Potts, MD.   Past Medical History:  Diagnosis Date   Acute respiratory failure with hypercapnia    Allergy    Anxiety    Aortic stenosis    a. S/p AVR on 02/06/2022 using a 21 mm Edwards Inspiris Resilina pericardial valve   Asthma    Bronchitis with bronchospasm    Cancer    skin   Depression    DM type 2 (diabetes mellitus, type 2)    FATTY LIVER DISEASE 08/05/2008   GERD (gastroesophageal reflux disease)    Hyperlipidemia    Hypertension    Hypothyroidism    Kidney stones    Leukocytosis    Obesity, Class III, BMI 40-49.9 (morbid obesity)    PERSONAL HX COLONIC POLYPS 09/01/2008   PONV (postoperative nausea and vomiting)    Seasonal allergies    TRANSAMINASES, SERUM, ELEVATED 08/05/2008    Past Surgical History:  Procedure Laterality Date   AORTIC VALVE REPLACEMENT N/A 02/06/2022   Procedure: AORTIC VALVE REPLACEMENT (AVR) WITH 21 MM INSIPIRIS AORTIC VALVE.;  Surgeon: Alleen Borne, MD;  Location: [facility] OR;  Service: Open Heart Surgery;  Laterality: N/A;   COLONOSCOPY  2013   Repeat 10 years   MANDIBLE SURGERY  1988   realignment   NOSE SURGERY  2007   sinus infections   POLYPECTOMY     RIGHT HEART CATH AND CORONARY ANGIOGRAPHY N/A 12/01/2021   Procedure: RIGHT HEART CATH AND CORONARY ANGIOGRAPHY;  Surgeon: Kathleene Hazel, MD;  Location: [facility] INVASIVE CV LAB;  Service: Cardiovascular;  Laterality: N/A;   skin cancer removal     TEE WITHOUT CARDIOVERSION N/A 02/06/2022   Procedure: TRANSESOPHAGEAL ECHOCARDIOGRAM (TEE);  Surgeon: Alleen Borne, MD;  Location: [facility] OR;  Service: Open Heart Surgery;   Laterality: N/A;   TONSILLECTOMY      Social History   Socioeconomic History   Marital status: Divorced    Spouse name: Not on file   Number of children: Not on file   Years of education: Not on file   Highest education level: Not on file  Occupational History   Occupation: Lead Clerk at [employer]  Tobacco Use   Smoking status: Former    Packs/day: 0.50    Years: 5.00    Additional pack years: 0.00    Total pack years: 2.50    Types: Cigarettes    Quit date: 10/23/1982    Years since quitting: 40.3   Smokeless tobacco: Never  Vaping Use   Vaping Use: Never used  Substance and Sexual Activity   Alcohol use: No   Drug use: No   Sexual activity: Not on file  Other Topics Concern   Not on file  Social History Narrative   Not on file   Social Determinants of Health   Financial Resource Strain: Not on file  Food Insecurity: Not  on file  Transportation Needs: Not on file  Physical Activity: Not on file  Stress: Not on file  Social Connections: Not on file    Family History  Problem Relation Age of Onset   Cancer Mother    Hypertension Mother    Thyroid disease Mother    Breast cancer Mother    Diabetes Mother    Prostate cancer Father    Heart attack Maternal Grandmother    Allergies Daughter    Colon cancer Neg Hx    Esophageal cancer Neg Hx    Rectal cancer Neg Hx    Stomach cancer Neg Hx    Colon polyps Neg Hx     Outpatient Encounter Medications as of 01/31/2023  Medication Sig   albuterol (VENTOLIN HFA) 108 (90 Base) MCG/ACT inhaler Inhale 2 puffs into the lungs every 6 (six) hours as needed for shortness of breath.   aspirin EC 81 MG tablet Take 81 mg by mouth daily. Swallow whole.   atorvastatin (LIPITOR) 20 MG tablet Take 1 tablet (20 mg total) by mouth daily.   citalopram (CELEXA) 40 MG tablet Take 1 tablet (40 mg total) by mouth daily.   Continuous Blood Gluc Receiver (FREESTYLE LIBRE 14 DAY READER) DEVI USE AS DIRECTED THREE TIMES DAILY.    Continuous Blood Gluc Sensor (FREESTYLE LIBRE 14 DAY SENSOR) MISC USE TO CHECK BLOOD SUGAR THREE TIMES DAILY WITH SLIDING SCALE INSULIN USE   Fluticasone-Salmeterol (ADVAIR DISKUS) 250-50 MCG/DOSE AEPB Inhale 1 puff into the lungs 2 (two) times daily. (Patient taking differently: Inhale 1 puff into the lungs 2 (two) times daily as needed (respiratory issues.).)   hydrochlorothiazide (HYDRODIURIL) 12.5 MG tablet Take 12.5 mg by mouth daily.   insulin glargine (LANTUS) 100 UNIT/ML Solostar Pen Inject 80 Units into the skin at bedtime.   insulin lispro (HUMALOG KWIKPEN) 100 UNIT/ML KwikPen Inject 5-11 Units into the skin 3 (three) times daily.   levothyroxine (SYNTHROID) 175 MCG tablet Take 1 tablet (175 mcg total) by mouth daily.   metFORMIN (GLUCOPHAGE) 1000 MG tablet Take 1 tablet (1,000 mg total) by mouth 2 (two) times daily with a meal. (Patient taking differently: Take 1,000 mg by mouth as needed.)   metoprolol tartrate (LOPRESSOR) 25 MG tablet TAKE ONE TABLET BY MOUTH TWICE DAILY   potassium chloride SA (KLOR-CON M) 20 MEQ tablet Take 1 tablet (20 mEq total) by mouth daily.   Semaglutide, 2 MG/DOSE, 8 MG/3ML SOPN Inject 2 mg as directed once a week.   [DISCONTINUED] Semaglutide, 1 MG/DOSE, 4 MG/3ML SOPN Inject 1 mg as directed once a week.   acetaminophen (TYLENOL) 325 MG tablet Take 2 tablets (650 mg total) by mouth every 6 (six) hours as needed for mild pain. (Patient not taking: Reported on 10/18/2022)   doxycycline (VIBRA-TABS) 100 MG tablet Take 100 mg by mouth 2 (two) times daily. (Patient not taking: Reported on 01/31/2023)   No facility-administered encounter medications on file as of 01/31/2023.    ALLERGIES: Allergies  Allergen Reactions   Codeine Nausea Only   Sulfa Antibiotics Itching    VACCINATION STATUS: Immunization History  Administered Date(s) Administered   Influenza Split 07/03/2012, 09/10/2013   Influenza,inj,Quad PF,6+ Mos 09/29/2015, 06/27/2016, 08/15/2017,  08/28/2018, 07/14/2019, 08/09/2020   Influenza-Unspecified 08/24/2011, 08/11/2020   Moderna Sars-Covid-2 Vaccination 04/05/2020, 04/28/2020   PNEUMOCOCCAL CONJUGATE-20 02/11/2022   Pneumococcal Polysaccharide-23 08/23/2004, 08/28/2018   Td 06/17/2008   Tdap 05/09/2019   Zoster Recombinat (Shingrix) 10/14/2019, 08/04/2020, 08/11/2020    Diabetes She presents for  her follow-up diabetic visit. She has type 2 diabetes mellitus. Onset time: Diagnosed at approx age of 17. Her disease course has been fluctuating. There are no hypoglycemic associated symptoms. Associated symptoms include fatigue and foot paresthesias. Pertinent negatives for diabetes include no polydipsia, no polyuria and no weight loss. There are no hypoglycemic complications. Symptoms are stable. Diabetic complications include nephropathy and peripheral neuropathy. Risk factors for coronary artery disease include stress, diabetes mellitus, dyslipidemia, family history, obesity, hypertension, post-menopausal and sedentary lifestyle. Current diabetic treatment includes intensive insulin program and oral agent (monotherapy) (and Ozempic). She is compliant with treatment most of the time. Her weight is fluctuating minimally. She is following a generally unhealthy diet. When asked about meal planning, she reported none. She has not had a previous visit with a dietitian. She rarely participates in exercise. Her home blood glucose trend is fluctuating minimally. Her overall blood glucose range is 180-200 mg/dl. (She presents today with her CGM showing slightly above target glycemic profile overall.  Her POCT A1c today is 8.7%, increasing from last visit of 8%.  Analysis of her CGM shows TIR 36%, TAR 64%, TBR 0% with a GMI of 8%.  She notes she still is having trouble eating like she should given dental issues (going tomorrow for denture fitting).  ) An ACE inhibitor/angiotensin II receptor blocker is being taken. She sees a podiatrist.Eye exam is not  current.  Hyperlipidemia This is a chronic problem. The current episode started more than 1 year ago. The problem is uncontrolled. Recent lipid tests were reviewed and are variable. Exacerbating diseases include chronic renal disease, diabetes, hypothyroidism and obesity. Factors aggravating her hyperlipidemia include thiazides and fatty foods. Current antihyperlipidemic treatment includes statins. The current treatment provides moderate improvement of lipids. Compliance problems include adherence to diet and adherence to exercise.  Risk factors for coronary artery disease include diabetes mellitus, dyslipidemia, family history, hypertension, obesity, post-menopausal, a sedentary lifestyle and stress.  Hypertension This is a chronic problem. The current episode started more than 1 year ago. The problem has been resolved since onset. The problem is controlled. Agents associated with hypertension include thyroid hormones. Risk factors for coronary artery disease include diabetes mellitus, dyslipidemia, family history, post-menopausal state, sedentary lifestyle, stress and obesity. Past treatments include angiotensin blockers and diuretics. The current treatment provides significant improvement. Compliance problems include exercise and diet.  Hypertensive end-organ damage includes kidney disease. Identifiable causes of hypertension include chronic renal disease and sleep apnea.     Review of systems  Constitutional: + steadily increasing body weight, current Body mass index is 33.85 kg/m., + fatigue-worsening, no subjective hyperthermia, no subjective hypothermia Eyes: no blurry vision, no xerophthalmia ENT: no sore throat, no nodules palpated in throat, no dysphagia/odynophagia, no hoarseness Cardiovascular: no chest pain, no shortness of breath, no palpitations, + intermittent leg swelling Respiratory: no cough, no shortness of breath Gastrointestinal: no nausea/vomiting/diarrhea Musculoskeletal: no  muscle/joint aches Skin: no rashes, no hyperemia Neurological: + resting tremors, + numbness/tingling/burning pain to bilateral feet, no dizziness Psychiatric: no depression, no anxiety  Objective:     BP 118/70 (BP Location: Right Arm, Patient Position: Sitting, Cuff Size: Large)   Pulse 79   Ht 5\' 5"  (1.651 m)   Wt 203 lb 6.4 oz (92.3 kg)   BMI 33.85 kg/m   Wt Readings from Last 3 Encounters:  01/31/23 203 lb 6.4 oz (92.3 kg)  11/01/22 196 lb 9.6 oz (89.2 kg)  10/18/22 195 lb (88.5 kg)     BP Readings from  Last 3 Encounters:  01/31/23 118/70  11/01/22 107/64  10/18/22 134/72     Physical Exam- Limited  Constitutional:  Body mass index is 33.85 kg/m. , not in acute distress, normal state of mind Eyes:  EOMI, no exophthalmos Musculoskeletal: no gross deformities, strength intact in all four extremities, no gross restriction of joint movements Skin:  no rashes, no hyperemia Neurological: mild resting tremor   Diabetic Foot Exam - Simple   No data filed     CMP ( most recent) CMP     Component Value Date/Time   NA 137 02/09/2022 0245   NA 141 04/18/2021 0943   K 4.3 02/09/2022 0245   CL 101 02/09/2022 0245   CO2 31 02/09/2022 0245   GLUCOSE 109 (H) 02/09/2022 0245   BUN 13 02/09/2022 0245   BUN 14 04/18/2021 0943   CREATININE 0.74 02/09/2022 0245   CREATININE 0.64 02/05/2013 1148   CALCIUM 8.2 (L) 02/09/2022 0245   PROT 6.6 02/02/2022 1450   PROT 6.8 04/18/2021 0943   ALBUMIN 3.7 02/02/2022 1450   ALBUMIN 4.4 04/18/2021 0943   AST 20 02/02/2022 1450   ALT 17 02/02/2022 1450   ALKPHOS 69 02/02/2022 1450   BILITOT 1.2 02/02/2022 1450   BILITOT 0.8 04/18/2021 0943   GFRNONAA >60 02/09/2022 0245   GFRAA >60 05/31/2020 0930     Diabetic Labs (most recent): Lab Results  Component Value Date   HGBA1C 8.7 (A) 01/31/2023   HGBA1C 8 08/22/2022   HGBA1C 7.7 06/08/2022   MICROALBUR 1.20 02/05/2013     Lipid Panel ( most recent) Lipid Panel      Component Value Date/Time   CHOL 165 04/18/2021 0943   TRIG 159 (H) 04/18/2021 0943   HDL 45 04/18/2021 0943   CHOLHDL 3.7 04/18/2021 0943   CHOLHDL 3.4 12/16/2019 0905   VLDL 22 12/16/2019 0905   LDLCALC 92 04/18/2021 0943   LABVLDL 28 04/18/2021 0943      Lab Results  Component Value Date   TSH 5.150 (H) 04/18/2021   TSH 1.503 12/16/2019   TSH 1.360 07/01/2019   TSH 2.480 04/01/2018   TSH 2.650 10/03/2016   TSH 3.620 10/06/2015   TSH 4.248 02/05/2013   TSH 3.189 06/30/2012           Assessment & Plan:   1) Type 2 diabetes mellitus without complication, with long-term current use of insulin (HCC)  She presents today with her CGM showing slightly above target glycemic profile overall.  Her POCT A1c today is 8.7%, increasing from last visit of 8%.  Analysis of her CGM shows TIR 36%, TAR 64%, TBR 0% with a GMI of 8%.  She notes she still is having trouble eating like she should given dental issues (going tomorrow for denture fitting).   - Sarah Pope has currently uncontrolled symptomatic type 2 DM since 67 years of age.  -Recent labs reviewed.  - I had a long discussion with her about the progressive nature of diabetes and the pathology behind its complications. -her diabetes is complicated by mild CKD, neuropathy and she remains at a high risk for more acute and chronic complications which include CAD, CVA, CKD, retinopathy, and neuropathy. These are all discussed in detail with her.  - Nutritional counseling repeated at each appointment due to patients tendency to fall back in to old habits.  - The patient admits there is a room for improvement in their diet and drink choices. -  Suggestion is made for  the patient to avoid simple carbohydrates from their diet including Cakes, Sweet Desserts / Pastries, Ice Cream, Soda (diet and regular), Sweet Tea, Candies, Chips, Cookies, Sweet Pastries, Store Bought Juices, Alcohol in Excess of 1-2 drinks a day, Artificial  Sweeteners, Coffee Creamer, and "Sugar-free" Products. This will help patient to have stable blood glucose profile and potentially avoid unintended weight gain.   - I encouraged the patient to switch to unprocessed or minimally processed complex starch and increased protein intake (animal or plant source), fruits, and vegetables.   - Patient is advised to stick to a routine mealtimes to eat 3 meals a day and avoid unnecessary snacks (to snack only to correct hypoglycemia).  - I have approached her with the following individualized plan to manage her diabetes and patient agrees:   -She is advised to continue Lantus 80 units SQ nightly, lower her Humalog 5-11 (in hopes she will be more consistent with taking it) units TID with meals if glucose is above 90 and she is eating (Specific instructions on how to titrate insulin dosage based on glucose readings given to patient in writing), Metformin 1000 mg po twice daily with meals, and increase her Ozempic  to 2 mg SQ weekly.  -she is encouraged to start consistently using her CGM to monitor glucose 4 times daily, before meals and before bed, and to call the clinic if she has readings less than 70 or above 300 for 3 tests in a row.  She is loving her upgrade to [place] 3.  - she is warned not to take insulin without proper monitoring per orders. - Adjustment parameters are given to her for hypo and hyperglycemia in writing.  -She has allergy to sulfa meds.  - Specific targets for  A1c; LDL, HDL, and Triglycerides were discussed with the patient.  2) Blood Pressure /Hypertension:  her blood pressure is controlled to target.   she is advised to continue her current medications including Metoprolol 25 po daily and Losartan 25 mg po daily.  She has follow up with her cardiologist today.  3) Lipids/Hyperlipidemia:    Review of her recent lipid panel from 08/22/22 showed controlled LDL at 68.  she is advised to continue Lipitor 20 mg daily at bedtime.  Side  effects and precautions discussed with her.    4)  Weight/Diet:  her Body mass index is 33.85 kg/m.  -  clearly complicating her diabetes care.   she is a candidate for weight loss. I discussed with her the fact that loss of 5 - 10% of her  current body weight will have the most impact on her diabetes management.  Exercise, and detailed carbohydrates information provided  -  detailed on discharge instructions.  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  She is concerned with this as she is having weight gain, constipation, and fatigue.  She would like for me to start managing her hypothyroidism.  I ordered TFTs to be checked prior to next visit to assess.  She is having labs done for her PCP soon, I advised her to get my thyroid labs checked at that same time to avoid another stick.  We did also discuss proper administration (says she has been taking it too close to a meal and her PPI).  6) Chronic Care/Health Maintenance: -she is on ACEI/ARB and Statin medications and is encouraged to initiate and continue to  follow up with Ophthalmology, Dentist, Podiatrist at least yearly or according to recommendations, and advised to stay away from smoking. I have recommended yearly flu vaccine and pneumonia vaccine at least every 5 years; moderate intensity exercise for up to 150 minutes weekly; and sleep for at least 7 hours a day.  - she is advised to maintain close follow up with Donetta Potts, MD for primary care needs, as well as her other providers for optimal and coordinated care.      I spent  41  minutes in the care of the patient today including review of labs from CMP, Lipids, Thyroid Function, Hematology (current and previous including abstractions from other facilities); face-to-face time discussing  her blood glucose readings/logs, discussing hypoglycemia and  hyperglycemia episodes and symptoms, medications doses, her options of short and long term treatment based on the latest standards of care / guidelines;  discussion about incorporating lifestyle medicine;  and documenting the encounter. Risk reduction counseling performed per USPSTF guidelines to reduce obesity and cardiovascular risk factors.     Please refer to Patient Instructions for Blood Glucose Monitoring and Insulin/Medications Dosing Guide"  in media tab for additional information. Please  also refer to " Patient Self Inventory" in the Media  tab for reviewed elements of pertinent patient history.  [employer] participated in the discussions, expressed understanding, and voiced agreement with the above plans.  All questions were answered to her satisfaction. she is encouraged to contact clinic should she have any questions or concerns prior to her return visit.     Follow up plan: - Return in about 3 months (around 05/02/2023) for Diabetes F/U with A1c in office, Thyroid follow up, Previsit labs, Bring meter and logs.   Ronny Bacon, [facility] [address] [place], [place] [address] Phone: [phone] Fax: [phone]  01/31/2023, 1:45 PM

## 2023-02-02 DIAGNOSIS — E08649 Diabetes mellitus due to underlying condition with hypoglycemia without coma: Secondary | ICD-10-CM | POA: Diagnosis not present

## 2023-02-05 DIAGNOSIS — E039 Hypothyroidism, unspecified: Secondary | ICD-10-CM | POA: Diagnosis not present

## 2023-02-15 DIAGNOSIS — H25811 Combined forms of age-related cataract, right eye: Secondary | ICD-10-CM | POA: Diagnosis not present

## 2023-02-16 ENCOUNTER — Encounter (HOSPITAL_COMMUNITY)
Admission: RE | Admit: 2023-02-16 | Discharge: 2023-02-16 | Disposition: A | Discharge status: Home / Self Care | Payer: Medicaid Other | Source: Ambulatory Visit | Attending: Ophthalmology

## 2023-02-21 NOTE — H&P (Signed)
Surgical History & Physical  Patient Name: Sarah Pope DOB: 06-Apr-1956  Surgery: Cataract extraction with intraocular lens implant phacoemulsification; Right Eye  Surgeon: Fabio Pierce MD Surgery Date:  02-23-23 Pre-Op Date:  02-19-23  HPI: A 37 Yr. old female patient is here for cataract eval, was seen at [place] in [place]. 1. 1. The patient complains of difficulty when reading fine print, books, newspaper, instructions etc.,/ c/o blurry vision which began 2 years ago. Both eyes are affected. The episode is gradual. Symptoms occur when the patient is inside and reading. This is negatively affecting the patient's quality of life and the patient is unable to function adequately in life with the current level of vision. HPI was performed by Fabio Pierce .  Medical History: Cataracts Anxiety Diabetes - DM Type 2 Heart Problem High Blood Pressure  Review of Systems Negative Allergic/Immunologic Negative Cardiovascular Negative Constitutional Negative Ear, Nose, Mouth & Throat Negative Endocrine Negative Eyes Negative Gastrointestinal Negative Genitourinary Negative Hemotologic/Lymphatic Negative Integumentary Negative Musculoskeletal Negative Neurological Negative Psychiatry Negative Respiratory  Social   Never smoked   Medication Aspirin, Advair Diskus, Omeprazole, HCTZ, Ozempic, Insulin, Cetirizine, Citalopram,   Sx/Procedures Open heart sx and valve repl, Jaw sx,   Drug Allergies  Sulfa,   History & Physical: Heent: cataract, right eye NECK: supple without bruits LUNGS: lungs clear to auscultation CV: regular rate and rhythm Abdomen: soft and non-tender Impression & Plan: Assessment: 1.  COMBINED FORMS AGE RELATED CATARACT; Both Eyes (H25.813) 2.  VITREOUS DETACHMENT PVD; Both Eyes (H43.813) 3.  BLEPHARITIS; Right Upper Lid, Right Lower Lid, Left Upper Lid, Left Lower Lid (H01.001, H01.002,H01.004,H01.005) 4.  Pinguecula; Both Eyes (H11.153) 5.   Diabetes Type 2 No retinopathy (E11.9)  Plan: 1.  Cataract accounts for the patient's decreased vision. This visual impairment is not correctable with a tolerable change in glasses or contact lenses. Cataract surgery with an implantation of a new lens should significantly improve the visual and functional status of the patient. Discussed all risks, benefits, alternatives, and potential complications. Discussed the procedures and recovery. Patient desires to have surgery. A-scan ordered and performed today for intra-ocular lens calculations. The surgery will be performed in order to improve vision for driving, reading, and for eye examinations. Recommend phacoemulsification with intra-ocular lens. Recommend Dextenza for post-operative pain and inflammation. Right Eye worse - first. Dilates well - shugarcaine by protocol.  2.  Old Asymptomatic. RD precautions given. Patient to call with increase in flashing lights/floaters/dark curtain.  3.  Recommend regular lid cleaning. Warm compresses 7-10 minutes every day, both eyes.  4.  Observe; Artificial tears as needed for irritation.  5.  mild cystic changes foveal OS. Unclear with clinical progression. Monitor.

## 2023-02-22 MED ORDER — NEOMYCIN-POLYMYXIN-DEXAMETH 3.5-10000-0.1 OP SUSP
OPHTHALMIC | Status: AC
  Filled 2023-02-22: qty 5

## 2023-02-23 ENCOUNTER — Ambulatory Visit (HOSPITAL_BASED_OUTPATIENT_CLINIC_OR_DEPARTMENT_OTHER): Payer: PPO | Admitting: Certified Registered Nurse Anesthetist

## 2023-02-23 ENCOUNTER — Encounter (HOSPITAL_COMMUNITY)
Admission: RE | Disposition: A | Discharge status: Home / Self Care | Payer: Self-pay | Source: Home / Self Care | Attending: Ophthalmology

## 2023-02-23 ENCOUNTER — Ambulatory Visit (HOSPITAL_COMMUNITY): Payer: PPO | Admitting: Certified Registered Nurse Anesthetist

## 2023-02-23 ENCOUNTER — Ambulatory Visit (HOSPITAL_COMMUNITY)
Admission: RE | Admit: 2023-02-23 | Discharge: 2023-02-23 | Disposition: A | Discharge status: Home / Self Care | Payer: PPO | Attending: Ophthalmology | Admitting: Ophthalmology

## 2023-02-23 DIAGNOSIS — J45909 Unspecified asthma, uncomplicated: Secondary | ICD-10-CM | POA: Insufficient documentation

## 2023-02-23 DIAGNOSIS — H0100B Unspecified blepharitis left eye, upper and lower eyelids: Secondary | ICD-10-CM | POA: Diagnosis not present

## 2023-02-23 DIAGNOSIS — I10 Essential (primary) hypertension: Secondary | ICD-10-CM | POA: Insufficient documentation

## 2023-02-23 DIAGNOSIS — E1136 Type 2 diabetes mellitus with diabetic cataract: Secondary | ICD-10-CM | POA: Insufficient documentation

## 2023-02-23 DIAGNOSIS — H25813 Combined forms of age-related cataract, bilateral: Secondary | ICD-10-CM | POA: Diagnosis not present

## 2023-02-23 DIAGNOSIS — F32A Depression, unspecified: Secondary | ICD-10-CM | POA: Diagnosis not present

## 2023-02-23 DIAGNOSIS — H11153 Pinguecula, bilateral: Secondary | ICD-10-CM | POA: Insufficient documentation

## 2023-02-23 DIAGNOSIS — G4730 Sleep apnea, unspecified: Secondary | ICD-10-CM | POA: Insufficient documentation

## 2023-02-23 DIAGNOSIS — H43813 Vitreous degeneration, bilateral: Secondary | ICD-10-CM | POA: Diagnosis not present

## 2023-02-23 DIAGNOSIS — F419 Anxiety disorder, unspecified: Secondary | ICD-10-CM | POA: Insufficient documentation

## 2023-02-23 DIAGNOSIS — Z794 Long term (current) use of insulin: Secondary | ICD-10-CM | POA: Insufficient documentation

## 2023-02-23 DIAGNOSIS — Z7985 Long-term (current) use of injectable non-insulin antidiabetic drugs: Secondary | ICD-10-CM | POA: Insufficient documentation

## 2023-02-23 DIAGNOSIS — H25811 Combined forms of age-related cataract, right eye: Secondary | ICD-10-CM

## 2023-02-23 DIAGNOSIS — Z87891 Personal history of nicotine dependence: Secondary | ICD-10-CM

## 2023-02-23 DIAGNOSIS — H0100A Unspecified blepharitis right eye, upper and lower eyelids: Secondary | ICD-10-CM | POA: Diagnosis not present

## 2023-02-23 HISTORY — PX: CATARACT EXTRACTION W/PHACO: SHX586

## 2023-02-23 LAB — GLUCOSE, CAPILLARY: Glucose-Capillary: 201 mg/dL — ABNORMAL HIGH (ref 70–99)

## 2023-02-23 SURGERY — PHACOEMULSIFICATION, CATARACT, WITH IOL INSERTION
Anesthesia: Monitor Anesthesia Care | Site: Eye | Laterality: Right

## 2023-02-23 MED ORDER — FENTANYL CITRATE (PF) 100 MCG/2ML IJ SOLN
INTRAMUSCULAR | Status: AC
  Filled 2023-02-23: qty 2

## 2023-02-23 MED ORDER — MIDAZOLAM HCL 5 MG/5ML IJ SOLN
INTRAMUSCULAR | Status: DC | PRN
  Administered 2023-02-23: 1 mg via INTRAVENOUS

## 2023-02-23 MED ORDER — BSS IO SOLN
INTRAOCULAR | Status: DC | PRN
  Administered 2023-02-23: 15 mL via INTRAOCULAR

## 2023-02-23 MED ORDER — SODIUM CHLORIDE 0.9% FLUSH
INTRAVENOUS | Status: DC | PRN
  Administered 2023-02-23: 10 mL via INTRAVENOUS

## 2023-02-23 MED ORDER — TETRACAINE HCL 0.5 % OP SOLN
1.0000 [drp] | OPHTHALMIC | Status: AC | PRN
  Administered 2023-02-23 (×3): 1 [drp] via OPHTHALMIC

## 2023-02-23 MED ORDER — STERILE WATER FOR IRRIGATION IR SOLN
Status: DC | PRN
  Administered 2023-02-23: 250 mL

## 2023-02-23 MED ORDER — EPINEPHRINE PF 1 MG/ML IJ SOLN
INTRAOCULAR | Status: DC | PRN
  Administered 2023-02-23: 500 mL

## 2023-02-23 MED ORDER — FENTANYL CITRATE (PF) 100 MCG/2ML IJ SOLN
INTRAMUSCULAR | Status: DC | PRN
  Administered 2023-02-23: 50 ug via INTRAVENOUS

## 2023-02-23 MED ORDER — EPINEPHRINE PF 1 MG/ML IJ SOLN
INTRAMUSCULAR | Status: AC
  Filled 2023-02-23: qty 2

## 2023-02-23 MED ORDER — SODIUM HYALURONATE 10 MG/ML IO SOLUTION
PREFILLED_SYRINGE | INTRAOCULAR | Status: DC | PRN
  Administered 2023-02-23: .85 mL via INTRAOCULAR

## 2023-02-23 MED ORDER — MIDAZOLAM HCL 2 MG/2ML IJ SOLN
INTRAMUSCULAR | Status: AC
  Filled 2023-02-23: qty 2

## 2023-02-23 MED ORDER — SODIUM CHLORIDE 0.9 % IV SOLN: INTRAVENOUS | Status: DC | PRN

## 2023-02-23 MED ORDER — SODIUM HYALURONATE 23MG/ML IO SOSY
PREFILLED_SYRINGE | INTRAOCULAR | Status: DC | PRN
  Administered 2023-02-23: .6 mL via INTRAOCULAR

## 2023-02-23 MED ORDER — LIDOCAINE HCL (PF) 1 % IJ SOLN
INTRAOCULAR | Status: DC | PRN
  Administered 2023-02-23: 1 mL via OPHTHALMIC

## 2023-02-23 MED ORDER — PHENYLEPHRINE HCL 2.5 % OP SOLN
1.0000 [drp] | OPHTHALMIC | Status: DC | PRN
  Administered 2023-02-23 (×2): 1 [drp] via OPHTHALMIC

## 2023-02-23 MED ORDER — TROPICAMIDE 1 % OP SOLN
1.0000 [drp] | OPHTHALMIC | Status: AC | PRN
  Administered 2023-02-23 (×3): 1 [drp] via OPHTHALMIC

## 2023-02-23 MED ORDER — LIDOCAINE HCL 3.5 % OP GEL
1.0000 | Freq: Once | OPHTHALMIC | Status: AC
  Administered 2023-02-23: 1 via OPHTHALMIC

## 2023-02-23 MED ORDER — POVIDONE-IODINE 5 % OP SOLN
OPHTHALMIC | Status: DC | PRN
  Administered 2023-02-23: 1 via OPHTHALMIC

## 2023-02-23 SURGICAL SUPPLY — 17 items
CATARACT SUITE SIGHTPATH (MISCELLANEOUS) ×1 IMPLANT
CLOTH BEACON ORANGE TIMEOUT ST (SAFETY) ×1 IMPLANT
EYE SHIELD UNIVERSAL CLEAR (GAUZE/BANDAGES/DRESSINGS) IMPLANT
FEE CATARACT SUITE SIGHTPATH (MISCELLANEOUS) ×1 IMPLANT
GLOVE BIOGEL PI IND STRL 7.0 (GLOVE) ×2 IMPLANT
GLOVE SURG SS PI 6.5 STRL IVOR (GLOVE) IMPLANT
GLOVE SURG SS PI 7.5 STRL IVOR (GLOVE) IMPLANT
LENS IOL RAYNER 19.5 (Intraocular Lens) ×1 IMPLANT
LENS IOL RAYONE EMV 19.5 (Intraocular Lens) IMPLANT
NDL HYPO 18GX1.5 BLUNT FILL (NEEDLE) ×1 IMPLANT
NEEDLE HYPO 18GX1.5 BLUNT FILL (NEEDLE) ×1 IMPLANT
Non-Woven Ophthalmic Drape IMPLANT
PAD ARMBOARD 7.5X6 YLW CONV (MISCELLANEOUS) ×1 IMPLANT
SIGHTPATH CAT PROC W REG LENS (Ophthalmic Related) IMPLANT
SYR TB 1ML LL NO SAFETY (SYRINGE) ×1 IMPLANT
TAPE SURG TRANSPORE 1 IN (GAUZE/BANDAGES/DRESSINGS) IMPLANT
WATER STERILE IRR 250ML POUR (IV SOLUTION) ×1 IMPLANT

## 2023-02-23 NOTE — Interval H&P Note (Signed)
History and Physical Interval Note:  02/23/2023 7:50 AM  Sarah Pope  has presented today for surgery, with the diagnosis of combined forms age related cataract; right.  The various methods of treatment have been discussed with the patient and family. After consideration of risks, benefits and other options for treatment, the patient has consented to  Procedure(s): CATARACT EXTRACTION PHACO AND INTRAOCULAR LENS PLACEMENT (IOC) (Right) as a surgical intervention.  The patient's history has been reviewed, patient examined, no change in status, stable for surgery.  I have reviewed the patient's chart and labs.  Questions were answered to the patient's satisfaction.     Fabio Pierce

## 2023-02-23 NOTE — Transfer of Care (Signed)
Immediate Anesthesia Transfer of Care Note  Patient: Sarah Pope  Procedure(s) Performed: CATARACT EXTRACTION PHACO AND INTRAOCULAR LENS PLACEMENT (IOC) (Right: Eye)  Patient Location: Short Stay  Anesthesia Type:MAC  Level of Consciousness: awake, alert , and oriented  Airway & Oxygen Therapy: Patient Spontanous Breathing  Post-op Assessment: Report given to RN, Post -op Vital signs reviewed and stable, Patient moving all extremities X 4, and Patient able to stick tongue midline  Post vital signs: Reviewed  Last Vitals:  Vitals Value Taken Time  BP 140/75 02/23/23 0817  Temp 36.9 C 02/23/23 0817  Pulse 67 02/23/23 0817  Resp 14 02/23/23 0817  SpO2 96 % 02/23/23 0817    Last Pain:  Vitals:   02/23/23 0817  TempSrc: Oral  PainSc: 0-No pain      Patients Stated Pain Goal: 6 (02/23/23 4098)  Complications: No notable events documented.

## 2023-02-23 NOTE — Op Note (Signed)
Date of procedure: 02/23/23  Pre-operative diagnosis:  Visually significant combined form age-related cataract, Right Eye (H25.811)  Post-operative diagnosis:  Visually significant combined form age-related cataract, Right Eye (H25.811)  Procedure: Removal of cataract via phacoemulsification and insertion of intra-ocular lens Rayner RAO200E +19.5D into the capsular bag of the Right Eye  Attending surgeon: Rudy Jew. Loralyn Rachel, MD, MA  Anesthesia: MAC, Topical Akten  Complications: None  Estimated Blood Loss: <67mL (minimal)  Specimens: None  Implants: As above  Indications:  Visually significant age-related cataract, Right Eye  Procedure:  The patient was seen and identified in the pre-operative area. The operative eye was identified and dilated.  The operative eye was marked.  Topical anesthesia was administered to the operative eye.     The patient was then to the operative suite and placed in the supine position.  A timeout was performed confirming the patient, procedure to be performed, and all other relevant information.   The patient's face was prepped and draped in the usual fashion for intra-ocular surgery.  A lid speculum was placed into the operative eye and the surgical microscope moved into place and focused.  A superotemporal paracentesis was created using a 20 gauge paracentesis blade.  Shugarcaine was injected into the anterior chamber.  Viscoelastic was injected into the anterior chamber.  A temporal clear-corneal main wound incision was created using a 2.79mm microkeratome.  A continuous curvilinear capsulorrhexis was initiated using an irrigating cystitome and completed using capsulorrhexis forceps.  Hydrodissection and hydrodeliniation were performed.  Viscoelastic was injected into the anterior chamber.  A phacoemulsification handpiece and a chopper as a second instrument were used to remove the nucleus and epinucleus. The irrigation/aspiration handpiece was used to remove any  remaining cortical material.   The capsular bag was reinflated with viscoelastic, checked, and found to be intact.  The intraocular lens was inserted into the capsular bag.  The irrigation/aspiration handpiece was used to remove any remaining viscoelastic.  The clear corneal wound and paracentesis wounds were then hydrated and checked with Weck-Cels to be watertight. The lid-speculum was removed.  The drape was removed.  The patient's face was cleaned with a wet and dry 4x4. A clear shield was taped over the eye. The patient was taken to the post-operative care unit in good condition, having tolerated the procedure well.  Post-Op Instructions: The patient will follow up at [facility] for a same day post-operative evaluation and will receive all other orders and instructions.

## 2023-02-23 NOTE — Anesthesia Preprocedure Evaluation (Signed)
Anesthesia Evaluation  Patient identified by MRN, date of birth, ID band Patient awake    Reviewed: Allergy & Precautions, H&P , NPO status , Patient's Chart, lab work & pertinent test results, reviewed documented beta blocker date and time   History of Anesthesia Complications (+) PONV and history of anesthetic complications  Airway Mallampati: II  TM Distance: >3 FB Neck ROM: full    Dental no notable dental hx.    Pulmonary neg pulmonary ROS, asthma , sleep apnea , former smoker   Pulmonary exam normal breath sounds clear to auscultation       Cardiovascular Exercise Tolerance: Good hypertension, + Valvular Problems/Murmurs  Rhythm:regular Rate:Normal     Neuro/Psych  PSYCHIATRIC DISORDERS Anxiety Depression    negative neurological ROS  negative psych ROS   GI/Hepatic negative GI ROS, Neg liver ROS,GERD  ,,  Endo/Other  negative endocrine ROSdiabetes, Type 2Hypothyroidism    Renal/GU Renal diseasenegative Renal ROS  negative genitourinary   Musculoskeletal   Abdominal   Peds  Hematology negative hematology ROS (+)   Anesthesia Other Findings   Reproductive/Obstetrics negative OB ROS                             Anesthesia Physical Anesthesia Plan  ASA: 3  Anesthesia Plan: MAC   Post-op Pain Management:    Induction:   PONV Risk Score and Plan:   Airway Management Planned:   Additional Equipment:   Intra-op Plan:   Post-operative Plan:   Informed Consent: I have reviewed the patients History and Physical, chart, labs and discussed the procedure including the risks, benefits and alternatives for the proposed anesthesia with the patient or authorized representative who has indicated his/her understanding and acceptance.     Dental Advisory Given  Plan Discussed with: CRNA  Anesthesia Plan Comments:        Anesthesia Quick Evaluation

## 2023-02-23 NOTE — Discharge Instructions (Signed)
Please discharge patient when stable, will follow up today with Dr. Mare Ludtke at the [facility] office immediately following discharge.  Leave shield in place until visit.  All paperwork with discharge instructions will be given at the office.  [facility] Address:  [address]  [place], [place] [address]  

## 2023-02-25 NOTE — Anesthesia Postprocedure Evaluation (Signed)
Anesthesia Post Note  Patient: Sarah Pope  Procedure(s) Performed: CATARACT EXTRACTION PHACO AND INTRAOCULAR LENS PLACEMENT (IOC) (Right: Eye)  Patient location during evaluation: Phase II Anesthesia Type: MAC Level of consciousness: awake Pain management: pain level controlled Vital Signs Assessment: post-procedure vital signs reviewed and stable Respiratory status: spontaneous breathing and respiratory function stable Cardiovascular status: blood pressure returned to baseline and stable Postop Assessment: no headache and no apparent nausea or vomiting Anesthetic complications: no Comments: Late entry   No notable events documented.   Last Vitals:  Vitals:   02/23/23 0648 02/23/23 0817  BP: 125/69 (!) 140/75  Pulse: 65 67  Resp: 18 14  Temp: 36.8 C 36.9 C  SpO2: 99% 96%    Last Pain:  Vitals:   02/23/23 0817  TempSrc: Oral  PainSc: 0-No pain                 Windell Norfolk

## 2023-02-27 ENCOUNTER — Encounter (HOSPITAL_COMMUNITY): Payer: Self-pay | Admitting: Ophthalmology

## 2023-03-01 DIAGNOSIS — H25812 Combined forms of age-related cataract, left eye: Secondary | ICD-10-CM | POA: Diagnosis not present

## 2023-03-04 DIAGNOSIS — E08649 Diabetes mellitus due to underlying condition with hypoglycemia without coma: Secondary | ICD-10-CM | POA: Diagnosis not present

## 2023-03-07 ENCOUNTER — Encounter (HOSPITAL_COMMUNITY)
Admission: RE | Admit: 2023-03-07 | Discharge: 2023-03-07 | Disposition: A | Discharge status: Home / Self Care | Payer: PPO | Source: Ambulatory Visit | Attending: Ophthalmology | Admitting: Ophthalmology

## 2023-03-07 NOTE — H&P (Signed)
Surgical History & Physical  Patient Name: Sarah Pope DOB: Mar 10, 1956  Surgery: Cataract extraction with intraocular lens implant phacoemulsification; Left Eye  Surgeon: Fabio Pierce MD Surgery Date:  03-09-23 Pre-Op Date:  03-01-23  HPI: A 60 Yr. old female patient 1. The patient is returning after cataract post-op. The right eye is affected. Status post cataract post-op,1 week ago: Since the last visit, the affected area is doing well. The patient's vision is stable. Patient is following postop medication instructions. The patient complains of difficulty when reading fine print, books, newspaper, instructions etc. c/o blurry vision in left eye which began 2 years ago. Both eyes are affected. The episode is gradual. Symptoms occur when the patient is inside and reading. This is negatively affecting the patient's quality of life and the patient is unable to function adequately in life with the current level of vision. HPI was performed by Fabio Pierce .  Medical History: Cataracts Anxiety Diabetes - DM Type 2 Heart Problem High Blood Pressure  Review of Systems Negative Allergic/Immunologic Negative Cardiovascular Negative Constitutional Negative Ear, Nose, Mouth & Throat Negative Endocrine Negative Eyes Negative Gastrointestinal Negative Genitourinary Negative Hemotologic/Lymphatic Negative Integumentary Negative Musculoskeletal Negative Neurological Negative Psychiatry Negative Respiratory  Social   Never smoked   Medication Moxifloxacin, Ilevro, Prednisolone acetate 1%,  Aspirin, Advair Diskus, Omeprazole, HCTZ, Ozempic, Insulin, Cetirizine, Citalopram,   Sx/Procedures Phaco c IOL OD,  Open heart sx and valve repl, Jaw sx,   Drug Allergies  Sulfa,   History & Physical: Heent: cataract, left eye NECK: supple without bruits LUNGS: lungs clear to auscultation CV: regular rate and rhythm Abdomen: soft and non-tender Impression & Plan: Assessment: 1.   CATARACT EXTRACTION STATUS; Right Eye (Z98.41) 2.  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  1 week after cataract surgery. Doing well with improved vision and normal eye pressure. Call with any problems or concerns. Stop Moxifloxacin. Continue Ilevro 1 drop 1x/day for 3 more weeks. Continue Pred Acetate 1 drop 2x/day for 3 more weeks.  2.  Cataract accounts for the patient's decreased vision. This visual impairment is not correctable with a tolerable change in glasses or contact lenses. Cataract surgery with an implantation of a new lens should significantly improve the visual and functional status of the patient. Discussed all risks, benefits, alternatives, and potential complications. Discussed the procedures and recovery. Patient desires to have surgery. A-scan ordered and performed today for intra-ocular lens calculations. The surgery will be performed in order to improve vision for driving, reading, and for eye examinations. Recommend phacoemulsification with intra-ocular lens. Recommend Dextenza for post-operative pain and inflammation. Left Eye. Surgery required to correct imbalance of vision. Dilates well - shugarcaine by protocol.

## 2023-03-07 NOTE — Pre-Procedure Instructions (Signed)
Attempted pre-op phonecall. Left VM for her to call us back. 

## 2023-03-08 NOTE — Pre-Procedure Instructions (Signed)
Left  2nd VM for patient to call us back about her surgery tomorrow.

## 2023-03-08 NOTE — Pre-Procedure Instructions (Signed)
Willaim Bane, patients emergency contact called me back and states that [facility] phone is not working. Tammy will be bringing patient tomorrow and is going to drive to patients house and tell her what her arrival time is and what meds to take. She also knows to have patient take 1/2 of her night time insulin tonight and no diabetic meds in the morning. Zella Richer notified that patient is aware of surgery time.

## 2023-03-08 NOTE — Pre-Procedure Instructions (Signed)
Sarah Pope messaged me back and had the same contact number I did. She also left patient a message to make sure she spoke with [place] about her surgery. I waited about 30 minutes and again left her another message to call us back.

## 2023-03-08 NOTE — Pre-Procedure Instructions (Signed)
Zella Richer messaged me back to give me patient's emergency contact to see if I could reach patient that way. I called Willaim Bane at [phone] and left her a message to have patient call us back. I Kennon Rounds to let her know as well.

## 2023-03-08 NOTE — Pre-Procedure Instructions (Signed)
Messaged Zella Richer at D Wrozek's office to let her know we have been unable to reach patient.

## 2023-03-09 ENCOUNTER — Ambulatory Visit (HOSPITAL_BASED_OUTPATIENT_CLINIC_OR_DEPARTMENT_OTHER): Payer: PPO | Admitting: Anesthesiology

## 2023-03-09 ENCOUNTER — Ambulatory Visit (HOSPITAL_COMMUNITY): Payer: PPO | Admitting: Anesthesiology

## 2023-03-09 ENCOUNTER — Encounter (HOSPITAL_COMMUNITY): Payer: Self-pay | Admitting: Ophthalmology

## 2023-03-09 ENCOUNTER — Ambulatory Visit (HOSPITAL_COMMUNITY)
Admission: RE | Admit: 2023-03-09 | Discharge: 2023-03-09 | Disposition: A | Discharge status: Home / Self Care | Payer: PPO | Attending: Ophthalmology | Admitting: Ophthalmology

## 2023-03-09 ENCOUNTER — Encounter (HOSPITAL_COMMUNITY)
Admission: RE | Disposition: A | Discharge status: Home / Self Care | Payer: Self-pay | Source: Home / Self Care | Attending: Ophthalmology

## 2023-03-09 DIAGNOSIS — I10 Essential (primary) hypertension: Secondary | ICD-10-CM

## 2023-03-09 DIAGNOSIS — E119 Type 2 diabetes mellitus without complications: Secondary | ICD-10-CM | POA: Diagnosis not present

## 2023-03-09 DIAGNOSIS — G4730 Sleep apnea, unspecified: Secondary | ICD-10-CM | POA: Diagnosis not present

## 2023-03-09 DIAGNOSIS — E785 Hyperlipidemia, unspecified: Secondary | ICD-10-CM | POA: Insufficient documentation

## 2023-03-09 DIAGNOSIS — F419 Anxiety disorder, unspecified: Secondary | ICD-10-CM | POA: Insufficient documentation

## 2023-03-09 DIAGNOSIS — Z794 Long term (current) use of insulin: Secondary | ICD-10-CM | POA: Insufficient documentation

## 2023-03-09 DIAGNOSIS — F32A Depression, unspecified: Secondary | ICD-10-CM | POA: Diagnosis not present

## 2023-03-09 DIAGNOSIS — Z7985 Long-term (current) use of injectable non-insulin antidiabetic drugs: Secondary | ICD-10-CM | POA: Diagnosis not present

## 2023-03-09 DIAGNOSIS — Z7951 Long term (current) use of inhaled steroids: Secondary | ICD-10-CM | POA: Diagnosis not present

## 2023-03-09 DIAGNOSIS — E039 Hypothyroidism, unspecified: Secondary | ICD-10-CM | POA: Insufficient documentation

## 2023-03-09 DIAGNOSIS — Z87891 Personal history of nicotine dependence: Secondary | ICD-10-CM | POA: Diagnosis not present

## 2023-03-09 DIAGNOSIS — K219 Gastro-esophageal reflux disease without esophagitis: Secondary | ICD-10-CM | POA: Diagnosis not present

## 2023-03-09 DIAGNOSIS — J45909 Unspecified asthma, uncomplicated: Secondary | ICD-10-CM | POA: Diagnosis not present

## 2023-03-09 DIAGNOSIS — H25812 Combined forms of age-related cataract, left eye: Secondary | ICD-10-CM

## 2023-03-09 DIAGNOSIS — E1136 Type 2 diabetes mellitus with diabetic cataract: Secondary | ICD-10-CM | POA: Diagnosis not present

## 2023-03-09 DIAGNOSIS — Z6831 Body mass index (BMI) 31.0-31.9, adult: Secondary | ICD-10-CM | POA: Diagnosis not present

## 2023-03-09 HISTORY — PX: CATARACT EXTRACTION W/PHACO: SHX586

## 2023-03-09 LAB — GLUCOSE, CAPILLARY: Glucose-Capillary: 140 mg/dL — ABNORMAL HIGH (ref 70–99)

## 2023-03-09 SURGERY — PHACOEMULSIFICATION, CATARACT, WITH IOL INSERTION
Anesthesia: Monitor Anesthesia Care | Site: Eye | Laterality: Left

## 2023-03-09 MED ORDER — POVIDONE-IODINE 5 % OP SOLN
OPHTHALMIC | Status: DC | PRN
  Administered 2023-03-09: 1 via OPHTHALMIC

## 2023-03-09 MED ORDER — LIDOCAINE HCL (PF) 1 % IJ SOLN
INTRAOCULAR | Status: DC | PRN
  Administered 2023-03-09: 1 mL via OPHTHALMIC

## 2023-03-09 MED ORDER — MIDAZOLAM HCL 5 MG/5ML IJ SOLN
INTRAMUSCULAR | Status: DC | PRN
  Administered 2023-03-09: 2 mg via INTRAVENOUS

## 2023-03-09 MED ORDER — SODIUM HYALURONATE 23MG/ML IO SOSY
PREFILLED_SYRINGE | INTRAOCULAR | Status: DC | PRN
  Administered 2023-03-09: .6 mL via INTRAOCULAR

## 2023-03-09 MED ORDER — BSS IO SOLN
INTRAOCULAR | Status: DC | PRN
  Administered 2023-03-09: 15 mL via INTRAOCULAR

## 2023-03-09 MED ORDER — STERILE WATER FOR IRRIGATION IR SOLN
Status: DC | PRN
  Administered 2023-03-09: 250 mL

## 2023-03-09 MED ORDER — TETRACAINE HCL 0.5 % OP SOLN
1.0000 [drp] | OPHTHALMIC | Status: AC | PRN
  Administered 2023-03-09 (×3): 1 [drp] via OPHTHALMIC

## 2023-03-09 MED ORDER — SODIUM CHLORIDE 0.9% FLUSH
INTRAVENOUS | Status: DC | PRN
  Administered 2023-03-09: 5 mL via INTRAVENOUS

## 2023-03-09 MED ORDER — PHENYLEPHRINE HCL 2.5 % OP SOLN
1.0000 [drp] | OPHTHALMIC | Status: AC | PRN
  Administered 2023-03-09 (×3): 1 [drp] via OPHTHALMIC

## 2023-03-09 MED ORDER — TROPICAMIDE 1 % OP SOLN
1.0000 [drp] | OPHTHALMIC | Status: AC | PRN
  Administered 2023-03-09 (×3): 1 [drp] via OPHTHALMIC

## 2023-03-09 MED ORDER — SODIUM HYALURONATE 10 MG/ML IO SOLUTION
PREFILLED_SYRINGE | INTRAOCULAR | Status: DC | PRN
  Administered 2023-03-09: .85 mL via INTRAOCULAR

## 2023-03-09 MED ORDER — MIDAZOLAM HCL 2 MG/2ML IJ SOLN
INTRAMUSCULAR | Status: AC
  Filled 2023-03-09: qty 2

## 2023-03-09 MED ORDER — LIDOCAINE HCL 3.5 % OP GEL
1.0000 | Freq: Once | OPHTHALMIC | Status: AC
  Administered 2023-03-09: 1 via OPHTHALMIC

## 2023-03-09 MED ORDER — EPINEPHRINE PF 1 MG/ML IJ SOLN
INTRAOCULAR | Status: DC | PRN
  Administered 2023-03-09: 500 mL

## 2023-03-09 SURGICAL SUPPLY — 14 items
CATARACT SUITE SIGHTPATH (MISCELLANEOUS) ×1 IMPLANT
CLOTH BEACON ORANGE TIMEOUT ST (SAFETY) ×1 IMPLANT
EYE SHIELD UNIVERSAL CLEAR (GAUZE/BANDAGES/DRESSINGS) IMPLANT
FEE CATARACT SUITE SIGHTPATH (MISCELLANEOUS) ×1 IMPLANT
GLOVE BIOGEL PI IND STRL 7.0 (GLOVE) ×2 IMPLANT
LENS IOL RAYNER 19.0 (Intraocular Lens) ×1 IMPLANT
LENS IOL RAYONE EMV 19.0 (Intraocular Lens) IMPLANT
NDL HYPO 18GX1.5 BLUNT FILL (NEEDLE) ×1 IMPLANT
NEEDLE HYPO 18GX1.5 BLUNT FILL (NEEDLE) ×1 IMPLANT
PAD ARMBOARD 7.5X6 YLW CONV (MISCELLANEOUS) ×1 IMPLANT
RING MALYGIN 7.0 (MISCELLANEOUS) IMPLANT
SYR TB 1ML LL NO SAFETY (SYRINGE) ×1 IMPLANT
TAPE SURG TRANSPORE 1 IN (GAUZE/BANDAGES/DRESSINGS) IMPLANT
WATER STERILE IRR 250ML POUR (IV SOLUTION) ×1 IMPLANT

## 2023-03-09 NOTE — Interval H&P Note (Signed)
History and Physical Interval Note:  03/09/2023 1:19 PM  Sarah Pope  has presented today for surgery, with the diagnosis of combined forms age related cataract; left.  The various methods of treatment have been discussed with the patient and family. After consideration of risks, benefits and other options for treatment, the patient has consented to  Procedure(s) with comments: CATARACT EXTRACTION PHACO AND INTRAOCULAR LENS PLACEMENT (IOC) (Left) - CDE: as a surgical intervention.  The patient's history has been reviewed, patient examined, no change in status, stable for surgery.  I have reviewed the patient's chart and labs.  Questions were answered to the patient's satisfaction.     Fabio Pierce

## 2023-03-09 NOTE — Discharge Instructions (Addendum)
Please discharge patient when stable, will follow up today with Dr. Wrzosek at the [facility] office immediately following discharge.  Leave shield in place until visit.  All paperwork with discharge instructions will be given at the office.  [facility] Address:  [address]  [place], [place] [address]  

## 2023-03-09 NOTE — Anesthesia Postprocedure Evaluation (Signed)
Anesthesia Post Note  Patient: [name]  Procedure(s) Performed: CATARACT EXTRACTION PHACO AND INTRAOCULAR LENS PLACEMENT (IOC) (Left: Eye)  Patient location during evaluation: Short Stay Anesthesia Type: MAC Level of consciousness: awake and alert Pain management: pain level controlled Vital Signs Assessment: post-procedure vital signs reviewed and stable Respiratory status: spontaneous breathing Cardiovascular status: stable and blood pressure returned to baseline Postop Assessment: no apparent nausea or vomiting Anesthetic complications: no   No notable events documented.   Last Vitals:  Vitals:   03/09/23 1256 03/09/23 1335  BP: 124/61 (!) 109/93  Pulse: (!) 57   Resp: 18   Temp: 37.1 C 36.8 C  SpO2: 99% 99%    Last Pain:  Vitals:   03/09/23 1335  TempSrc: Oral  PainSc: 0-No pain                 Abigael Mogle

## 2023-03-09 NOTE — Transfer of Care (Signed)
Immediate Anesthesia Transfer of Care Note  Patient: Sarah Pope  Procedure(s) Performed: CATARACT EXTRACTION PHACO AND INTRAOCULAR LENS PLACEMENT (IOC) (Left: Eye)  Patient Location: Short Stay  Anesthesia Type:MAC  Level of Consciousness: awake  Airway & Oxygen Therapy: Patient Spontanous Breathing  Post-op Assessment: Report given to RN  Post vital signs: Reviewed and stable  Last Vitals:  Vitals Value Taken Time  BP    Temp    Pulse    Resp    SpO2      Last Pain:  Vitals:   03/09/23 1335  TempSrc: Oral  PainSc: 0-No pain         Complications: No notable events documented.

## 2023-03-09 NOTE — Op Note (Signed)
Date of procedure: 03/09/23  Pre-operative diagnosis: Visually significant age-related combined cataract, Left Eye (H25.812)  Post-operative diagnosis: Visually significant age-related combined cataract, Left Eye (H25.812)  Procedure: Removal of cataract via phacoemulsification and insertion of intra-ocular lens Rayner [id number] +19.0D into the capsular bag of the Left Eye  Attending surgeon: Rudy Jew. Danisha Brassfield, MD, MA  Anesthesia: MAC, Topical Akten  Complications: None  Estimated Blood Loss: <59mL (minimal)  Specimens: None  Implants: As above  Indications:  Visually significant age-related cataract, Left Eye  Procedure:  The patient was seen and identified in the pre-operative area. The operative eye was identified and dilated.  The operative eye was marked.  Topical anesthesia was administered to the operative eye.     The patient was then to the operative suite and placed in the supine position.  A timeout was performed confirming the patient, procedure to be performed, and all other relevant information.   The patient's face was prepped and draped in the usual fashion for intra-ocular surgery.  A lid speculum was placed into the operative eye and the surgical microscope moved into place and focused.  An inferotemporal paracentesis was created using a 20 gauge paracentesis blade.  Shugarcaine was injected into the anterior chamber.  Viscoelastic was injected into the anterior chamber.  A temporal clear-corneal main wound incision was created using a 2.31mm microkeratome.  A continuous curvilinear capsulorrhexis was initiated using an irrigating cystitome and completed using capsulorrhexis forceps.  Hydrodissection and hydrodeliniation were performed.  Viscoelastic was injected into the anterior chamber.  A phacoemulsification handpiece and a chopper as a second instrument were used to remove the nucleus and epinucleus. The irrigation/aspiration handpiece was used to remove any remaining  cortical material.   The capsular bag was reinflated with viscoelastic, checked, and found to be intact.  The intraocular lens was inserted into the capsular bag.  The irrigation/aspiration handpiece was used to remove any remaining viscoelastic.  The clear corneal wound and paracentesis wounds were then hydrated and checked with Weck-Cels to be watertight. Maxitrol drops were instilled into the operative eye. The lid-speculum was removed.  The drape was removed.  The patient's face was cleaned with a wet and dry 4x4.    A clear shield was taped over the eye. The patient was taken to the post-operative care unit in good condition, having tolerated the procedure well.  Post-Op Instructions: The patient will follow up at [facility] for a same day post-operative evaluation and will receive all other orders and instructions.

## 2023-03-09 NOTE — Anesthesia Preprocedure Evaluation (Signed)
Anesthesia Evaluation  Patient identified by MRN, date of birth, ID band Patient awake    Reviewed: Allergy & Precautions, H&P , NPO status , Patient's Chart, lab work & pertinent test results  History of Anesthesia Complications (+) PONV and history of anesthetic complications  Airway Mallampati: II  TM Distance: >3 FB Neck ROM: Full    Dental  (+) Dental Advisory Given, Edentulous Upper, Missing   Pulmonary asthma , sleep apnea , former smoker   Pulmonary exam normal breath sounds clear to auscultation       Cardiovascular Exercise Tolerance: Good hypertension, Pt. on medications + Valvular Problems/Murmurs (AVR)  Rhythm:Regular Rate:Normal + Systolic murmurs    Neuro/Psych  PSYCHIATRIC DISORDERS Anxiety Depression    negative neurological ROS     GI/Hepatic Neg liver ROS,GERD  Medicated,,  Endo/Other  diabetesHypothyroidism    Renal/GU Renal disease  negative genitourinary   Musculoskeletal negative musculoskeletal ROS (+)    Abdominal   Peds negative pediatric ROS (+)  Hematology negative hematology ROS (+)   Anesthesia Other Findings   Reproductive/Obstetrics negative OB ROS                             Anesthesia Physical Anesthesia Plan  ASA: 3  Anesthesia Plan: MAC   Post-op Pain Management: Minimal or no pain anticipated   Induction:   PONV Risk Score and Plan: 0 and Treatment may vary due to age or medical condition  Airway Management Planned: Nasal Cannula and Natural Airway  Additional Equipment:   Intra-op Plan:   Post-operative Plan:   Informed Consent: I have reviewed the patients History and Physical, chart, labs and discussed the procedure including the risks, benefits and alternatives for the proposed anesthesia with the patient or authorized representative who has indicated his/her understanding and acceptance.     Dental advisory given  Plan  Discussed with: CRNA and Surgeon  Anesthesia Plan Comments:         Anesthesia Quick Evaluation

## 2023-03-13 ENCOUNTER — Encounter (HOSPITAL_COMMUNITY): Payer: Self-pay | Admitting: Ophthalmology

## 2023-04-04 DIAGNOSIS — E08649 Diabetes mellitus due to underlying condition with hypoglycemia without coma: Secondary | ICD-10-CM | POA: Diagnosis not present

## 2023-05-04 DIAGNOSIS — E08649 Diabetes mellitus due to underlying condition with hypoglycemia without coma: Secondary | ICD-10-CM | POA: Diagnosis not present

## 2023-05-09 DIAGNOSIS — E039 Hypothyroidism, unspecified: Secondary | ICD-10-CM | POA: Diagnosis not present

## 2023-05-09 LAB — TSH: TSH: 4.5 (ref 0.41–5.90)

## 2023-05-11 ENCOUNTER — Ambulatory Visit (INDEPENDENT_AMBULATORY_CARE_PROVIDER_SITE_OTHER): Payer: PPO | Admitting: Nurse Practitioner

## 2023-05-11 ENCOUNTER — Encounter: Payer: Self-pay | Admitting: Nurse Practitioner

## 2023-05-11 VITALS — BP 111/63 | HR 76 | Ht 65.0 in | Wt 209.2 lb

## 2023-05-11 DIAGNOSIS — Z7984 Long term (current) use of oral hypoglycemic drugs: Secondary | ICD-10-CM

## 2023-05-11 DIAGNOSIS — Z7985 Long-term (current) use of injectable non-insulin antidiabetic drugs: Secondary | ICD-10-CM | POA: Diagnosis not present

## 2023-05-11 DIAGNOSIS — E039 Hypothyroidism, unspecified: Secondary | ICD-10-CM | POA: Diagnosis not present

## 2023-05-11 DIAGNOSIS — I10 Essential (primary) hypertension: Secondary | ICD-10-CM | POA: Diagnosis not present

## 2023-05-11 DIAGNOSIS — E782 Mixed hyperlipidemia: Secondary | ICD-10-CM

## 2023-05-11 DIAGNOSIS — Z794 Long term (current) use of insulin: Secondary | ICD-10-CM | POA: Diagnosis not present

## 2023-05-11 DIAGNOSIS — E119 Type 2 diabetes mellitus without complications: Secondary | ICD-10-CM

## 2023-05-11 LAB — POCT GLYCOSYLATED HEMOGLOBIN (HGB A1C): Hemoglobin A1C: 9.3 % — AB (ref 4.0–5.6)

## 2023-05-11 MED ORDER — METFORMIN HCL 1000 MG PO TABS: 1000.0000 mg | ORAL_TABLET | Freq: Two times a day (BID) | ORAL | 1 refills | Status: DC

## 2023-05-11 MED ORDER — INSULIN LISPRO (1 UNIT DIAL) 100 UNIT/ML (KWIKPEN): 8.0000 [IU] | PEN_INJECTOR | Freq: Three times a day (TID) | SUBCUTANEOUS | 3 refills | Status: DC

## 2023-05-11 MED ORDER — LEVOTHYROXINE SODIUM 125 MCG PO TABS: 125.0000 ug | ORAL_TABLET | Freq: Every day | ORAL | 1 refills | Status: DC

## 2023-05-11 MED ORDER — SEMAGLUTIDE (2 MG/DOSE) 8 MG/3ML ~~LOC~~ SOPN: 2.0000 mg | PEN_INJECTOR | SUBCUTANEOUS | 3 refills | Status: DC

## 2023-05-11 MED ORDER — INSULIN GLARGINE 100 UNIT/ML SOLOSTAR PEN: 80.0000 [IU] | PEN_INJECTOR | Freq: Every day | SUBCUTANEOUS | 3 refills | Status: DC

## 2023-05-11 NOTE — Progress Notes (Signed)
Endocrinology Follow Up Note       05/11/2023, 10:19 AM   Subjective:    Patient ID: Sarah Pope, female    DOB: 05-Oct-1956.  Sarah Pope is being seen in follow up after being seen in consultation for management of currently uncontrolled symptomatic diabetes requested by  Donetta Potts, MD.   Past Medical History:  Diagnosis Date   Acute respiratory failure with hypercapnia (HCC)    Allergy    Anxiety    Aortic stenosis    a. S/p AVR on 02/06/2022 using a 21 mm Edwards Inspiris Resilina pericardial valve   Asthma    Bronchitis with bronchospasm    Cancer (HCC)    skin   Depression    DM type 2 (diabetes mellitus, type 2) (HCC)    FATTY LIVER DISEASE 08/05/2008   GERD (gastroesophageal reflux disease)    Hyperlipidemia    Hypertension    Hypothyroidism    Kidney stones    Leukocytosis    Obesity, Class III, BMI 40-49.9 (morbid obesity) (HCC)    PERSONAL HX COLONIC POLYPS 09/01/2008   PONV (postoperative nausea and vomiting)    Seasonal allergies    TRANSAMINASES, SERUM, ELEVATED 08/05/2008    Past Surgical History:  Procedure Laterality Date   AORTIC VALVE REPLACEMENT N/A 02/06/2022   Procedure: AORTIC VALVE REPLACEMENT (AVR) WITH 21 MM INSIPIRIS AORTIC VALVE.;  Surgeon: Alleen Borne, MD;  Location: [facility] OR;  Service: Open Heart Surgery;  Laterality: N/A;   CATARACT EXTRACTION W/PHACO Right 02/23/2023   Procedure: CATARACT EXTRACTION PHACO AND INTRAOCULAR LENS PLACEMENT (IOC);  Surgeon: Fabio Pierce, MD;  Location: [facility];  Service: Ophthalmology;  Laterality: Right;  CDE: 4.99   CATARACT EXTRACTION W/PHACO Left 03/09/2023   Procedure: CATARACT EXTRACTION PHACO AND INTRAOCULAR LENS PLACEMENT (IOC);  Surgeon: Fabio Pierce, MD;  Location: [facility];  Service: Ophthalmology;  Laterality: Left;  CDE:6.12   COLONOSCOPY  2013   Repeat 10 years   MANDIBLE SURGERY  1988   realignment   NOSE  SURGERY  2007   sinus infections   POLYPECTOMY     RIGHT HEART CATH AND CORONARY ANGIOGRAPHY N/A 12/01/2021   Procedure: RIGHT HEART CATH AND CORONARY ANGIOGRAPHY;  Surgeon: Kathleene Hazel, MD;  Location: [facility];  Service: Cardiovascular;  Laterality: N/A;   skin cancer removal     TEE WITHOUT CARDIOVERSION N/A 02/06/2022   Procedure: TRANSESOPHAGEAL ECHOCARDIOGRAM (TEE);  Surgeon: Alleen Borne, MD;  Location: [facility];  Service: Open Heart Surgery;  Laterality: N/A;   TONSILLECTOMY      Social History   Socioeconomic History   Marital status: Divorced    Spouse name: Not on file   Number of children: Not on file   Years of education: Not on file   Highest education level: Not on file  Occupational History   Occupation: Lead Clerk at [employer]  Tobacco Use   Smoking status: Former    Current packs/day: 0.00    Average packs/day: 0.5 packs/day for 5.0 years (2.5 ttl pk-yrs)    Types: Cigarettes    Start date: 10/23/1977    Quit date: 10/23/1982  Years since quitting: 40.5   Smokeless tobacco: Never  Vaping Use   Vaping status: Never Used  Substance and Sexual Activity   Alcohol use: No   Drug use: No   Sexual activity: Not on file  Other Topics Concern   Not on file  Social History Narrative   Not on file   Social Determinants of Health   Financial Resource Strain: Not on file  Food Insecurity: Not on file  Transportation Needs: Not on file  Physical Activity: Not on file  Stress: Not on file  Social Connections: Not on file    Family History  Problem Relation Age of Onset   Cancer Mother    Hypertension Mother    Thyroid disease Mother    Breast cancer Mother    Diabetes Mother    Prostate cancer Father    Heart attack Maternal Grandmother    Allergies Daughter    Colon cancer Neg Hx    Esophageal cancer Neg Hx    Rectal cancer Neg Hx    Stomach cancer Neg Hx    Colon polyps Neg Hx     Outpatient Encounter Medications as of  05/11/2023  Medication Sig   albuterol (VENTOLIN HFA) 108 (90 Base) MCG/ACT inhaler Inhale 2 puffs into the lungs every 6 (six) hours as needed for shortness of breath.   aspirin EC 81 MG tablet Take 81 mg by mouth daily. Swallow whole.   atorvastatin (LIPITOR) 20 MG tablet Take 1 tablet (20 mg total) by mouth daily.   citalopram (CELEXA) 40 MG tablet Take 1 tablet (40 mg total) by mouth daily.   Continuous Blood Gluc Receiver (FREESTYLE LIBRE 14 DAY READER) DEVI USE AS DIRECTED THREE TIMES DAILY.   Continuous Blood Gluc Sensor (FREESTYLE LIBRE 14 DAY SENSOR) MISC USE TO CHECK BLOOD SUGAR THREE TIMES DAILY WITH SLIDING SCALE INSULIN USE   Fluticasone-Salmeterol (ADVAIR DISKUS) 250-50 MCG/DOSE AEPB Inhale 1 puff into the lungs 2 (two) times daily. (Patient taking differently: Inhale 1 puff into the lungs 2 (two) times daily as needed (respiratory issues.).)   hydrochlorothiazide (HYDRODIURIL) 12.5 MG tablet Take 12.5 mg by mouth daily.   metoprolol tartrate (LOPRESSOR) 25 MG tablet TAKE ONE TABLET BY MOUTH TWICE DAILY   potassium chloride SA (KLOR-CON M) 20 MEQ tablet Take 1 tablet (20 mEq total) by mouth daily.   [DISCONTINUED] insulin glargine (LANTUS) 100 UNIT/ML Solostar Pen Inject 80 Units into the skin at bedtime.   [DISCONTINUED] insulin lispro (HUMALOG KWIKPEN) 100 UNIT/ML KwikPen Inject 5-11 Units into the skin 3 (three) times daily.   [DISCONTINUED] levothyroxine (SYNTHROID) 100 MCG tablet Take 100 mcg by mouth daily.   [DISCONTINUED] levothyroxine (SYNTHROID) 175 MCG tablet Take 1 tablet (175 mcg total) by mouth daily.   [DISCONTINUED] metFORMIN (GLUCOPHAGE) 1000 MG tablet Take 1 tablet (1,000 mg total) by mouth 2 (two) times daily with a meal. (Patient taking differently: Take 1,000 mg by mouth as needed.)   insulin glargine (LANTUS) 100 UNIT/ML Solostar Pen Inject 80 Units into the skin at bedtime.   insulin lispro (HUMALOG KWIKPEN) 100 UNIT/ML KwikPen Inject 8-14 Units into the skin 3  (three) times daily.   levothyroxine (SYNTHROID) 125 MCG tablet Take 1 tablet (125 mcg total) by mouth daily before breakfast.   metFORMIN (GLUCOPHAGE) 1000 MG tablet Take 1 tablet (1,000 mg total) by mouth 2 (two) times daily with a meal.   Semaglutide, 2 MG/DOSE, 8 MG/3ML SOPN Inject 2 mg as directed once a week.   [  DISCONTINUED] Semaglutide, 2 MG/DOSE, 8 MG/3ML SOPN Inject 2 mg as directed once a week. (Patient not taking: Reported on 05/11/2023)   No facility-administered encounter medications on file as of 05/11/2023.    ALLERGIES: Allergies  Allergen Reactions   Codeine Nausea Only   Sulfa Antibiotics Itching    VACCINATION STATUS: Immunization History  Administered Date(s) Administered   Influenza Split 07/03/2012, 09/10/2013   Influenza,inj,Quad PF,6+ Mos 09/29/2015, 06/27/2016, 08/15/2017, 08/28/2018, 07/14/2019, 08/09/2020   Influenza-Unspecified 08/24/2011, 08/11/2020   Moderna Sars-Covid-2 Vaccination 04/05/2020, 04/28/2020   PNEUMOCOCCAL CONJUGATE-20 02/11/2022   Pneumococcal Polysaccharide-23 08/23/2004, 08/28/2018   Td 06/17/2008   Tdap 05/09/2019   Zoster Recombinant(Shingrix) 10/14/2019, 08/04/2020, 08/11/2020    Diabetes She presents for her follow-up diabetic visit. She has type 2 diabetes mellitus. Onset time: Diagnosed at approx age of 63. Her disease course has been fluctuating. There are no hypoglycemic associated symptoms. Associated symptoms include fatigue and foot paresthesias. Pertinent negatives for diabetes include no polydipsia, no polyuria and no weight loss. There are no hypoglycemic complications. Symptoms are stable. Diabetic complications include nephropathy and peripheral neuropathy. Risk factors for coronary artery disease include stress, diabetes mellitus, dyslipidemia, family history, obesity, hypertension, post-menopausal and sedentary lifestyle. Current diabetic treatment includes intensive insulin program and oral agent (monotherapy) (and  Ozempic). She is compliant with treatment most of the time. Her weight is increasing steadily. She is following a generally unhealthy diet. When asked about meal planning, she reported none. She has not had a previous visit with a dietitian. She rarely participates in exercise. Her home blood glucose trend is fluctuating minimally. Her overall blood glucose range is >200 mg/dl. (She presents today with her CGM showing slightly above target glycemic profile overall.  Her POCT A1c today is 9.3%, increasing from last visit of 8.7%.  Analysis of her CGM shows TIR 23%, TAR 77%, TBR 0% with a GMI of 8.8%.  She notes she still is having trouble eating like she should given dental issues and was never able to get her Ozempic 2 mg prescribed at last visit.  ) An ACE inhibitor/angiotensin II receptor blocker is being taken. She sees a podiatrist.Eye exam is not current.  Hyperlipidemia This is a chronic problem. The current episode started more than 1 year ago. The problem is uncontrolled. Recent lipid tests were reviewed and are variable. Exacerbating diseases include chronic renal disease, diabetes, hypothyroidism and obesity. Factors aggravating her hyperlipidemia include thiazides and fatty foods. Current antihyperlipidemic treatment includes statins. The current treatment provides moderate improvement of lipids. Compliance problems include adherence to diet and adherence to exercise.  Risk factors for coronary artery disease include diabetes mellitus, dyslipidemia, family history, hypertension, obesity, post-menopausal, a sedentary lifestyle and stress.  Hypertension This is a chronic problem. The current episode started more than 1 year ago. The problem has been resolved since onset. The problem is controlled. Agents associated with hypertension include thyroid hormones. Risk factors for coronary artery disease include diabetes mellitus, dyslipidemia, family history, post-menopausal state, sedentary lifestyle,  stress and obesity. Past treatments include angiotensin blockers and diuretics. The current treatment provides significant improvement. Compliance problems include exercise and diet.  Hypertensive end-organ damage includes kidney disease. Identifiable causes of hypertension include chronic renal disease and sleep apnea.     Review of systems  Constitutional: + steadily increasing body weight, current Body mass index is 34.81 kg/m., + fatigue-worsening, no subjective hyperthermia, no subjective hypothermia Eyes: no blurry vision, no xerophthalmia ENT: no sore throat, no nodules palpated in throat, no dysphagia/odynophagia,  no hoarseness Cardiovascular: no chest pain, no shortness of breath, no palpitations, + intermittent leg swelling Respiratory: no cough, no shortness of breath Gastrointestinal: no nausea/vomiting/diarrhea Musculoskeletal: no muscle/joint aches Skin: no rashes, no hyperemia Neurological: + resting tremors, + numbness/tingling/burning pain to bilateral feet, no dizziness Psychiatric: no depression, no anxiety  Objective:     BP 111/63 (BP Location: Left Arm, Patient Position: Sitting, Cuff Size: Normal)   Pulse 76   Ht 5\' 5"  (1.651 m)   Wt 209 lb 3.2 oz (94.9 kg)   BMI 34.81 kg/m   Wt Readings from Last 3 Encounters:  05/11/23 209 lb 3.2 oz (94.9 kg)  03/09/23 190 lb 0.6 oz (86.2 kg)  02/23/23 190 lb (86.2 kg)     BP Readings from Last 3 Encounters:  05/11/23 111/63  03/09/23 (!) 109/93  02/23/23 (!) 140/75     Physical Exam- Limited  Constitutional:  Body mass index is 34.81 kg/m. , not in acute distress, normal state of mind Eyes:  EOMI, no exophthalmos Musculoskeletal: no gross deformities, strength intact in all four extremities, no gross restriction of joint movements Skin:  no rashes, no hyperemia Neurological: mild resting tremor     Diabetic Foot Exam - Simple   No data filed     CMP ( most recent) CMP     Component Value Date/Time    NA 137 02/09/2022 0245   NA 141 04/18/2021 0943   K 4.3 02/09/2022 0245   CL 101 02/09/2022 0245   CO2 31 02/09/2022 0245   GLUCOSE 109 (H) 02/09/2022 0245   BUN 13 02/09/2022 0245   BUN 14 04/18/2021 0943   CREATININE 0.74 02/09/2022 0245   CREATININE 0.64 02/05/2013 1148   CALCIUM 8.2 (L) 02/09/2022 0245   PROT 6.6 02/02/2022 1450   PROT 6.8 04/18/2021 0943   ALBUMIN 3.7 02/02/2022 1450   ALBUMIN 4.4 04/18/2021 0943   AST 20 02/02/2022 1450   ALT 17 02/02/2022 1450   ALKPHOS 69 02/02/2022 1450   BILITOT 1.2 02/02/2022 1450   BILITOT 0.8 04/18/2021 0943   GFRNONAA >60 02/09/2022 0245   GFRAA >60 05/31/2020 0930     Diabetic Labs (most recent): Lab Results  Component Value Date   HGBA1C 9.3 (A) 05/11/2023   HGBA1C 8.7 (A) 01/31/2023   HGBA1C 8 08/22/2022   MICROALBUR 1.20 02/05/2013     Lipid Panel ( most recent) Lipid Panel     Component Value Date/Time   CHOL 165 04/18/2021 0943   TRIG 159 (H) 04/18/2021 0943   HDL 45 04/18/2021 0943   CHOLHDL 3.7 04/18/2021 0943   CHOLHDL 3.4 12/16/2019 0905   VLDL 22 12/16/2019 0905   LDLCALC 92 04/18/2021 0943   LABVLDL 28 04/18/2021 0943      Lab Results  Component Value Date   TSH 4.50 05/09/2023   TSH 5.150 (H) 04/18/2021   TSH 1.503 12/16/2019   TSH 1.360 07/01/2019   TSH 2.480 04/01/2018   TSH 2.650 10/03/2016   TSH 3.620 10/06/2015   TSH 4.248 02/05/2013   TSH 3.189 06/30/2012          Assessment & Plan:   1) Type 2 diabetes mellitus without complication, with long-term current use of insulin (HCC)  She presents today with her CGM showing slightly above target glycemic profile overall.  Her POCT A1c today is 9.3%, increasing from last visit of 8.7%.  Analysis of her CGM shows TIR 23%, TAR 77%, TBR 0% with a GMI of 8.8%.  She notes she still is having trouble eating like she should given dental issues and was never able to get her Ozempic 2 mg prescribed at last visit.    - Sarah Pope has currently  uncontrolled symptomatic type 2 DM since 67 years of age.  -Recent labs reviewed.  - I had a long discussion with her about the progressive nature of diabetes and the pathology behind its complications. -her diabetes is complicated by mild CKD, neuropathy and she remains at a high risk for more acute and chronic complications which include CAD, CVA, CKD, retinopathy, and neuropathy. These are all discussed in detail with her.  - Nutritional counseling repeated at each appointment due to patients tendency to fall back in to old habits.  - The patient admits there is a room for improvement in their diet and drink choices. -  Suggestion is made for the patient to avoid simple carbohydrates from their diet including Cakes, Sweet Desserts / Pastries, Ice Cream, Soda (diet and regular), Sweet Tea, Candies, Chips, Cookies, Sweet Pastries, Store Bought Juices, Alcohol in Excess of 1-2 drinks a day, Artificial Sweeteners, Coffee Creamer, and "Sugar-free" Products. This will help patient to have stable blood glucose profile and potentially avoid unintended weight gain.   - I encouraged the patient to switch to unprocessed or minimally processed complex starch and increased protein intake (animal or plant source), fruits, and vegetables.   - Patient is advised to stick to a routine mealtimes to eat 3 meals a day and avoid unnecessary snacks (to snack only to correct hypoglycemia).  - I have approached her with the following individualized plan to manage her diabetes and patient agrees:   -She is advised to continue Lantus 80 units SQ nightly, increase her Humalog to 8-14 units TID with meals if glucose is above 90 and she is eating (Specific instructions on how to titrate insulin dosage based on glucose readings given to patient in writing), Metformin 1000 mg po twice daily with meals, and increase her Ozempic to 2 mg SQ weekly (script resent to pharmacy).  -she is encouraged to start consistently using her  CGM to monitor glucose 4 times daily, before meals and before bed, and to call the clinic if she has readings less than 70 or above 300 for 3 tests in a row.  She is loving her upgrade to [place] 3.  - she is warned not to take insulin without proper monitoring per orders. - Adjustment parameters are given to her for hypo and hyperglycemia in writing.  -She has allergy to sulfa meds.  - Specific targets for  A1c; LDL, HDL, and Triglycerides were discussed with the patient.  2) Blood Pressure /Hypertension:  her blood pressure is controlled to target.   she is advised to continue her current medications including Metoprolol 25 po daily and Losartan 25 mg po daily.  She has follow up with her cardiologist today.  3) Lipids/Hyperlipidemia:    Review of her recent lipid panel from 08/22/22 showed controlled LDL at 68.  she is advised to continue Lipitor 20 mg daily at bedtime.  Side effects and precautions discussed with her.    4)  Weight/Diet:  her Body mass index is 34.81 kg/m.  -  clearly complicating her diabetes care.   she is a candidate for weight loss. I discussed with her the fact that loss of 5 - 10% of her  current body weight will have the most impact on her diabetes management.  Exercise, and  detailed carbohydrates information provided  -  detailed on discharge instructions.  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  She is concerned with this as she is having weight gain, constipation, and fatigue.  She would like for me to start managing her hypothyroidism. Labs still suggestive of slight under-replacement.  I did increase her Levothyroxine to 125 mcg po daily before breakfast.  I ordered TFTs to be checked prior to next visit to assess.  I encouraged patient to let her other providers know that I will be managing to prevent duplication of efforts.   - The correct intake  of thyroid hormone (Levothyroxine, Synthroid), is on empty stomach first thing in the morning, with water, separated by at least 30 minutes from breakfast and other medications,  and separated by more than 4 hours from calcium, iron, multivitamins, acid reflux medications (PPIs).  - This medication is a life-long medication and will be needed to correct thyroid hormone imbalances for the rest of your life.  The dose may change from time to time, based on thyroid blood work.  - It is extremely important to be consistent taking this medication, near the same time each morning.  -AVOID TAKING PRODUCTS CONTAINING BIOTIN (commonly found in Hair, Skin, Nails vitamins) AS IT INTERFERES WITH THE VALIDITY OF THYROID FUNCTION BLOOD TESTS. 6) Chronic Care/Health Maintenance: -she is on ACEI/ARB and Statin medications and is encouraged to initiate and continue to follow up with Ophthalmology, Dentist, Podiatrist at least yearly or according to recommendations, and advised to stay away from smoking. I have recommended yearly flu vaccine and pneumonia vaccine at least every 5 years; moderate intensity exercise for up to 150 minutes weekly; and sleep for at least 7 hours a day.  - she is advised to maintain close follow up with Donetta Potts, MD for primary care needs, as well as her other providers for optimal and coordinated care.      I spent  41  minutes in the care of the patient today including review of labs from CMP, Lipids, Thyroid Function, Hematology (current and previous including abstractions from other facilities); face-to-face time discussing  her blood glucose readings/logs, discussing hypoglycemia and hyperglycemia episodes and symptoms, medications doses, her options of short and long term treatment based on the latest standards of care / guidelines;  discussion about incorporating lifestyle medicine;  and documenting the encounter. Risk reduction counseling performed per USPSTF guidelines to  reduce obesity and cardiovascular risk factors.     Please refer to Patient Instructions for Blood Glucose Monitoring and Insulin/Medications Dosing Guide"  in media tab for additional information. Please  also refer to " Patient Self Inventory" in the Media  tab for reviewed elements of pertinent patient history.  Sarah Pope participated in the discussions, expressed understanding, and voiced agreement with the above plans.  All questions were answered to her satisfaction. she is encouraged to contact clinic should she have any questions or concerns prior to her return visit.     Follow up plan: - Return in about 3 months (around 08/11/2023) for Diabetes F/U with A1c in office, Thyroid follow up, Previsit labs, Bring meter and logs.   Ronny Bacon, [facility] [address] [place], [place] [address] Phone: [phone] Fax: [phone]  05/11/2023, 10:19 AM

## 2023-05-24 ENCOUNTER — Telehealth: Payer: Self-pay | Admitting: Nurse Practitioner

## 2023-05-24 NOTE — Telephone Encounter (Signed)
Pt states the semaglutide is needing a prior authorization, but she said that you told her to just let you know if she had any issues getting it.

## 2023-05-24 NOTE — Telephone Encounter (Signed)
Can you please check on her PA for Ozempic?

## 2023-05-25 ENCOUNTER — Telehealth: Payer: Self-pay

## 2023-05-25 ENCOUNTER — Other Ambulatory Visit (HOSPITAL_COMMUNITY): Payer: Self-pay

## 2023-05-25 NOTE — Telephone Encounter (Signed)
Pharmacy Patient Advocate Encounter   Received notification from Pt Calls Messages that prior authorization for Ozempic is required/requested.   Insurance verification completed.   The patient is insured through [employer] .   Per test claim: PA required; PA submitted to [employer] via Fax Key/confirmation #/[id number] Status is pending

## 2023-05-28 ENCOUNTER — Other Ambulatory Visit (HOSPITAL_COMMUNITY): Payer: Self-pay

## 2023-05-28 NOTE — Telephone Encounter (Signed)
Pharmacy Patient Advocate Encounter  Received notification from [employer] that Prior Authorization for Ozempic (2 MG/DOSE) 8MG /3ML pen-injectors  has been APPROVED from 05/25/2023 to 05/24/2024. Ran test claim, Copay is $0.00

## 2023-05-31 NOTE — Telephone Encounter (Signed)
Called to let her know but VM was full.

## 2023-06-04 DIAGNOSIS — E08649 Diabetes mellitus due to underlying condition with hypoglycemia without coma: Secondary | ICD-10-CM | POA: Diagnosis not present

## 2023-06-28 DIAGNOSIS — Z23 Encounter for immunization: Secondary | ICD-10-CM | POA: Diagnosis not present

## 2023-07-05 DIAGNOSIS — E08649 Diabetes mellitus due to underlying condition with hypoglycemia without coma: Secondary | ICD-10-CM | POA: Diagnosis not present

## 2023-07-24 ENCOUNTER — Emergency Department (HOSPITAL_COMMUNITY)
Admission: EM | Admit: 2023-07-24 | Discharge: 2023-07-24 | Disposition: A | Discharge status: Home / Self Care | Payer: PPO | Attending: Emergency Medicine | Admitting: Emergency Medicine

## 2023-07-24 ENCOUNTER — Other Ambulatory Visit: Payer: Self-pay

## 2023-07-24 ENCOUNTER — Emergency Department (HOSPITAL_COMMUNITY): Payer: PPO

## 2023-07-24 ENCOUNTER — Encounter (HOSPITAL_COMMUNITY): Payer: Self-pay

## 2023-07-24 DIAGNOSIS — Z20828 Contact with and (suspected) exposure to other viral communicable diseases: Secondary | ICD-10-CM | POA: Diagnosis not present

## 2023-07-24 DIAGNOSIS — Z794 Long term (current) use of insulin: Secondary | ICD-10-CM | POA: Diagnosis not present

## 2023-07-24 DIAGNOSIS — R0602 Shortness of breath: Secondary | ICD-10-CM | POA: Diagnosis not present

## 2023-07-24 DIAGNOSIS — E860 Dehydration: Secondary | ICD-10-CM | POA: Diagnosis not present

## 2023-07-24 DIAGNOSIS — E876 Hypokalemia: Secondary | ICD-10-CM | POA: Diagnosis not present

## 2023-07-24 DIAGNOSIS — R000 Tachycardia, unspecified: Secondary | ICD-10-CM | POA: Diagnosis not present

## 2023-07-24 DIAGNOSIS — Z7982 Long term (current) use of aspirin: Secondary | ICD-10-CM | POA: Insufficient documentation

## 2023-07-24 DIAGNOSIS — Z79899 Other long term (current) drug therapy: Secondary | ICD-10-CM | POA: Insufficient documentation

## 2023-07-24 DIAGNOSIS — Z6832 Body mass index (BMI) 32.0-32.9, adult: Secondary | ICD-10-CM | POA: Diagnosis not present

## 2023-07-24 DIAGNOSIS — E119 Type 2 diabetes mellitus without complications: Secondary | ICD-10-CM | POA: Diagnosis not present

## 2023-07-24 DIAGNOSIS — U071 COVID-19: Secondary | ICD-10-CM | POA: Insufficient documentation

## 2023-07-24 DIAGNOSIS — I10 Essential (primary) hypertension: Secondary | ICD-10-CM | POA: Insufficient documentation

## 2023-07-24 DIAGNOSIS — J45909 Unspecified asthma, uncomplicated: Secondary | ICD-10-CM | POA: Diagnosis not present

## 2023-07-24 DIAGNOSIS — Z7984 Long term (current) use of oral hypoglycemic drugs: Secondary | ICD-10-CM | POA: Insufficient documentation

## 2023-07-24 DIAGNOSIS — R509 Fever, unspecified: Secondary | ICD-10-CM | POA: Diagnosis not present

## 2023-07-24 DIAGNOSIS — R030 Elevated blood-pressure reading, without diagnosis of hypertension: Secondary | ICD-10-CM | POA: Diagnosis not present

## 2023-07-24 DIAGNOSIS — R519 Headache, unspecified: Secondary | ICD-10-CM | POA: Diagnosis present

## 2023-07-24 LAB — CBC WITH DIFFERENTIAL/PLATELET
Abs Immature Granulocytes: 0.02 10*3/uL (ref 0.00–0.07)
Basophils Absolute: 0 10*3/uL (ref 0.0–0.1)
Basophils Relative: 1 %
Eosinophils Absolute: 0.1 10*3/uL (ref 0.0–0.5)
Eosinophils Relative: 2 %
HCT: 39.8 % (ref 36.0–46.0)
Hemoglobin: 12.7 g/dL (ref 12.0–15.0)
Immature Granulocytes: 0 %
Lymphocytes Relative: 18 %
Lymphs Abs: 0.8 10*3/uL (ref 0.7–4.0)
MCH: 25.7 pg — ABNORMAL LOW (ref 26.0–34.0)
MCHC: 31.9 g/dL (ref 30.0–36.0)
MCV: 80.4 fL (ref 80.0–100.0)
Monocytes Absolute: 0.8 10*3/uL (ref 0.1–1.0)
Monocytes Relative: 17 %
Neutro Abs: 2.8 10*3/uL (ref 1.7–7.7)
Neutrophils Relative %: 62 %
Platelets: 203 10*3/uL (ref 150–400)
RBC: 4.95 MIL/uL (ref 3.87–5.11)
RDW: 13.1 % (ref 11.5–15.5)
WBC: 4.6 10*3/uL (ref 4.0–10.5)
nRBC: 0 % (ref 0.0–0.2)

## 2023-07-24 LAB — BASIC METABOLIC PANEL
Anion gap: 13 (ref 5–15)
BUN: 16 mg/dL (ref 8–23)
CO2: 24 mmol/L (ref 22–32)
Calcium: 8.8 mg/dL — ABNORMAL LOW (ref 8.9–10.3)
Chloride: 99 mmol/L (ref 98–111)
Creatinine, Ser: 1.07 mg/dL — ABNORMAL HIGH (ref 0.44–1.00)
GFR, Estimated: 57 mL/min — ABNORMAL LOW (ref >60)
Glucose, Bld: 176 mg/dL — ABNORMAL HIGH (ref 70–99)
Potassium: 3.3 mmol/L — ABNORMAL LOW (ref 3.5–5.1)
Sodium: 136 mmol/L (ref 135–145)

## 2023-07-24 LAB — SARS CORONAVIRUS 2 BY RT PCR: SARS Coronavirus 2 by RT PCR: POSITIVE — AB

## 2023-07-24 MED ORDER — POTASSIUM CHLORIDE CRYS ER 20 MEQ PO TBCR
20.0000 meq | EXTENDED_RELEASE_TABLET | Freq: Once | ORAL | Status: AC
  Administered 2023-07-24: 20 meq via ORAL
  Filled 2023-07-24: qty 1

## 2023-07-24 MED ORDER — PAXLOVID (300/100) 20 X 150 MG & 10 X 100MG PO TBPK
3.0000 | ORAL_TABLET | Freq: Two times a day (BID) | ORAL | 0 refills | Status: AC
Start: 2023-07-24 — End: 2023-07-29

## 2023-07-24 MED ORDER — SODIUM CHLORIDE 0.9 % IV BOLUS
1000.0000 mL | Freq: Once | INTRAVENOUS | Status: AC
  Administered 2023-07-24: 1000 mL via INTRAVENOUS

## 2023-07-24 MED ORDER — ONDANSETRON HCL 4 MG/2ML IJ SOLN
4.0000 mg | Freq: Once | INTRAMUSCULAR | Status: AC
  Administered 2023-07-24: 4 mg via INTRAVENOUS
  Filled 2023-07-24: qty 2

## 2023-07-24 NOTE — ED Triage Notes (Signed)
Pt presents Covid +, sent by PCP for possible dehydration, per pt she has all the symptoms, SOB, Fever, fatigue.

## 2023-07-24 NOTE — Discharge Instructions (Signed)
You were seen in the emergency department for evaluation of COVID symptoms.  Your chest x-ray did not show any obvious pneumonia and your oxygen level was stable.  Your potassium was mildly low.  We are starting you on Paxlovid to help with your COVID symptoms.  You should not take your atorvastatin (Lipitor) while you are taking this medication.  Please rest and drink plenty of fluids.  Follow-up with your regular doctor return to the emergency department if any worsening or concerning symptoms

## 2023-07-24 NOTE — ED Notes (Signed)
Discussed D/C information with the patient - no additional concerns at this time.

## 2023-07-24 NOTE — ED Provider Notes (Signed)
[place] [facility]MENT AT [facility] Provider Note   CSN: [id number] Arrival date & time: 07/24/23  1628     History {Add pertinent medical, surgical, social history, OB history to HPI:1} Chief Complaint  Patient presents with   Shortness of Breath    Sarah Pope is a 67 y.o. female.  She has a history of asthma, diabetes, hypertension, aortic valve stenosis.  She said she started with COVID-like symptoms yesterday with headache body aches cough nausea vomiting.  Saw her PCP today where she tested positive for COVID he felt she needed to come here for further evaluation.  She said her sugars have been running around 170.  She has tried nothing for her symptoms other than fluids and rest.  The history is provided by the patient.  Influenza Presenting symptoms: cough, fatigue, fever, headache, myalgias, nausea and vomiting   Myalgias:    Location:  Generalized   Severity:  Severe   Onset quality:  Gradual   Duration:  2 days   Timing:  Constant   Progression:  Unchanged Risk factors: diabetes   Risk factors: no immunocompromised state        Home Medications Prior to Admission medications   Medication Sig Start Date End Date Taking? Authorizing Provider  albuterol (VENTOLIN HFA) 108 (90 Base) MCG/ACT inhaler Inhale 2 puffs into the lungs every 6 (six) hours as needed for shortness of breath. 02/09/22   Ardelle Balls, PA-C  aspirin EC 81 MG tablet Take 81 mg by mouth daily. Swallow whole.    [provider]  atorvastatin (LIPITOR) 20 MG tablet Take 1 tablet (20 mg total) by mouth daily. 04/18/21   Laroy Apple M, DO  citalopram (CELEXA) 40 MG tablet Take 1 tablet (40 mg total) by mouth daily. 04/18/21   Laroy Apple M, DO  Continuous Blood Gluc Receiver (FREESTYLE LIBRE 14 DAY READER) DEVI USE AS DIRECTED THREE TIMES DAILY. 09/15/20   Ladona Ridgel, Malena M, DO  Continuous Blood Gluc Sensor (FREESTYLE LIBRE 14 DAY SENSOR) MISC USE TO CHECK  BLOOD SUGAR THREE TIMES DAILY WITH SLIDING SCALE INSULIN USE 10/25/22   Dani Gobble, NP  Fluticasone-Salmeterol (ADVAIR DISKUS) 250-50 MCG/DOSE AEPB Inhale 1 puff into the lungs 2 (two) times daily. Patient taking differently: Inhale 1 puff into the lungs 2 (two) times daily as needed (respiratory issues.). 08/16/20   Laroy Apple M, DO  hydrochlorothiazide (HYDRODIURIL) 12.5 MG tablet Take 12.5 mg by mouth daily. 10/06/22   [provider]  insulin glargine (LANTUS) 100 UNIT/ML Solostar Pen Inject 80 Units into the skin at bedtime. 05/11/23   Dani Gobble, NP  insulin lispro (HUMALOG KWIKPEN) 100 UNIT/ML KwikPen Inject 8-14 Units into the skin 3 (three) times daily. 05/11/23   Dani Gobble, NP  levothyroxine (SYNTHROID) 125 MCG tablet Take 1 tablet (125 mcg total) by mouth daily before breakfast. 05/11/23   Dani Gobble, NP  metFORMIN (GLUCOPHAGE) 1000 MG tablet Take 1 tablet (1,000 mg total) by mouth 2 (two) times daily with a meal. 05/11/23   Dani Gobble, NP  metoprolol tartrate (LOPRESSOR) 25 MG tablet TAKE ONE TABLET BY MOUTH TWICE DAILY 10/09/22   Antoine Poche, MD  potassium chloride SA (KLOR-CON M) 20 MEQ tablet Take 1 tablet (20 mEq total) by mouth daily. 10/18/22   Antoine Poche, MD  Semaglutide, 2 MG/DOSE, 8 MG/3ML SOPN Inject 2 mg as directed once a week. 05/11/23   Dani Gobble, NP  Allergies    Codeine and Sulfa antibiotics    Review of Systems   Review of Systems  Constitutional:  Positive for fatigue and fever.  Eyes:  Negative for visual disturbance.  Respiratory:  Positive for cough.   Cardiovascular:  Negative for chest pain.  Gastrointestinal:  Positive for nausea and vomiting. Negative for abdominal pain.  Musculoskeletal:  Positive for myalgias.  Neurological:  Positive for headaches.    Physical Exam Updated Vital Signs BP 129/76 (BP Location: Right Arm)   Pulse (!) 102   Temp 98.9 F (37.2 C) (Oral)    Resp 20   Ht 5\' 5"  (1.651 m)   Wt 94.9 kg   SpO2 94%   BMI 34.82 kg/m  Physical Exam Vitals and nursing note reviewed.  Constitutional:      General: She is not in acute distress.    Appearance: She is well-developed.  HENT:     Head: Normocephalic and atraumatic.  Eyes:     Conjunctiva/sclera: Conjunctivae normal.  Cardiovascular:     Rate and Rhythm: Regular rhythm. Tachycardia present.     Heart sounds: No murmur heard. Pulmonary:     Effort: Pulmonary effort is normal. No respiratory distress.     Breath sounds: Normal breath sounds.  Abdominal:     Palpations: Abdomen is soft.     Tenderness: There is no abdominal tenderness. There is no guarding or rebound.  Musculoskeletal:        General: No swelling.     Cervical back: Neck supple.     Right lower leg: No edema.     Left lower leg: No edema.  Skin:    General: Skin is warm and dry.     Capillary Refill: Capillary refill takes less than 2 seconds.  Neurological:     General: No focal deficit present.     Mental Status: She is alert.     ED Results / Procedures / Treatments   Labs (all labs ordered are listed, but only abnormal results are displayed) Labs Reviewed  SARS CORONAVIRUS 2 BY RT PCR  CBC WITH DIFFERENTIAL/PLATELET  URINALYSIS, ROUTINE W REFLEX MICROSCOPIC  BASIC METABOLIC PANEL    EKG None  Radiology No results found.  Procedures Procedures  {Document cardiac monitor, telemetry assessment procedure when appropriate:1}  Medications Ordered in ED Medications  sodium chloride 0.9 % bolus 1,000 mL (has no administration in time range)  ondansetron (ZOFRAN) injection 4 mg (has no administration in time range)    ED Course/ Medical Decision Making/ A&P   {   Click here for ABCD2, HEART and other calculatorsREFRESH Note before signing :1}                              Medical Decision Making Amount and/or Complexity of Data Reviewed Labs: ordered. Radiology:  ordered.  Risk Prescription drug management.   This patient complains of ***; this involves an extensive number of treatment Options and is a complaint that carries with it a high risk of complications and morbidity. The differential includes ***  I ordered, reviewed and interpreted labs, which included *** I ordered medication *** and reviewed PMP when indicated. I ordered imaging studies which included *** and I independently    visualized and interpreted imaging which showed *** Additional history obtained from *** Previous records obtained and reviewed *** I consulted *** and discussed lab and imaging findings and discussed disposition.  Cardiac monitoring reviewed, ***  Social determinants considered, *** Critical Interventions: ***  After the interventions stated above, I reevaluated the patient and found *** Admission and further testing considered, ***   {Document critical care time when appropriate:1} {Document review of labs and clinical decision tools ie heart score, Chads2Vasc2 etc:1}  {Document your independent review of radiology images, and any outside records:1} {Document your discussion with family members, caretakers, and with consultants:1} {Document social determinants of health affecting pt's care:1} {Document your decision making why or why not admission, treatments were needed:1} Final Clinical Impression(s) / ED Diagnoses Final diagnoses:  None    Rx / DC Orders ED Discharge Orders     None

## 2023-08-04 DIAGNOSIS — E08649 Diabetes mellitus due to underlying condition with hypoglycemia without coma: Secondary | ICD-10-CM | POA: Diagnosis not present

## 2023-08-15 ENCOUNTER — Ambulatory Visit: Payer: PPO | Admitting: Nurse Practitioner

## 2023-08-15 ENCOUNTER — Other Ambulatory Visit: Payer: Self-pay | Admitting: Nurse Practitioner

## 2023-08-15 DIAGNOSIS — Z7985 Long-term (current) use of injectable non-insulin antidiabetic drugs: Secondary | ICD-10-CM

## 2023-08-15 DIAGNOSIS — Z794 Long term (current) use of insulin: Secondary | ICD-10-CM

## 2023-08-15 DIAGNOSIS — I10 Essential (primary) hypertension: Secondary | ICD-10-CM

## 2023-08-15 DIAGNOSIS — E119 Type 2 diabetes mellitus without complications: Secondary | ICD-10-CM | POA: Diagnosis not present

## 2023-08-15 DIAGNOSIS — Z7984 Long term (current) use of oral hypoglycemic drugs: Secondary | ICD-10-CM

## 2023-08-15 DIAGNOSIS — E039 Hypothyroidism, unspecified: Secondary | ICD-10-CM | POA: Diagnosis not present

## 2023-08-15 DIAGNOSIS — R5382 Chronic fatigue, unspecified: Secondary | ICD-10-CM

## 2023-08-15 DIAGNOSIS — E782 Mixed hyperlipidemia: Secondary | ICD-10-CM

## 2023-08-16 LAB — IRON,TIBC AND FERRITIN PANEL
Ferritin: 46 ng/mL (ref 15–150)
Iron Saturation: 24 % (ref 15–55)
Iron: 79 ug/dL (ref 27–139)
Total Iron Binding Capacity: 331 ug/dL (ref 250–450)
UIBC: 252 ug/dL (ref 118–369)

## 2023-08-16 LAB — COMPREHENSIVE METABOLIC PANEL
ALT: 19 [IU]/L (ref 0–32)
AST: 20 [IU]/L (ref 0–40)
Albumin: 4.1 g/dL (ref 3.9–4.9)
Alkaline Phosphatase: 84 [IU]/L (ref 44–121)
BUN/Creatinine Ratio: 18 (ref 12–28)
BUN: 15 mg/dL (ref 8–27)
Bilirubin Total: 0.9 mg/dL (ref 0.0–1.2)
CO2: 25 mmol/L (ref 20–29)
Calcium: 9.4 mg/dL (ref 8.7–10.3)
Chloride: 100 mmol/L (ref 96–106)
Creatinine, Ser: 0.85 mg/dL (ref 0.57–1.00)
Globulin, Total: 2.4 g/dL (ref 1.5–4.5)
Glucose: 144 mg/dL — ABNORMAL HIGH (ref 70–99)
Potassium: 4 mmol/L (ref 3.5–5.2)
Sodium: 140 mmol/L (ref 134–144)
Total Protein: 6.5 g/dL (ref 6.0–8.5)
eGFR: 76 mL/min/{1.73_m2} (ref >59)

## 2023-08-16 LAB — T4, FREE: Free T4: 1.17 ng/dL (ref 0.82–1.77)

## 2023-08-16 LAB — TSH: TSH: 1.36 u[IU]/mL (ref 0.450–4.500)

## 2023-08-28 ENCOUNTER — Encounter: Payer: Self-pay | Admitting: Nurse Practitioner

## 2023-08-28 ENCOUNTER — Ambulatory Visit (INDEPENDENT_AMBULATORY_CARE_PROVIDER_SITE_OTHER): Payer: PPO | Admitting: Nurse Practitioner

## 2023-08-28 VITALS — BP 124/76 | HR 76 | Ht 65.0 in | Wt 199.0 lb

## 2023-08-28 DIAGNOSIS — I10 Essential (primary) hypertension: Secondary | ICD-10-CM | POA: Diagnosis not present

## 2023-08-28 DIAGNOSIS — Z794 Long term (current) use of insulin: Secondary | ICD-10-CM | POA: Diagnosis not present

## 2023-08-28 DIAGNOSIS — Z7985 Long-term (current) use of injectable non-insulin antidiabetic drugs: Secondary | ICD-10-CM | POA: Diagnosis not present

## 2023-08-28 DIAGNOSIS — Z7984 Long term (current) use of oral hypoglycemic drugs: Secondary | ICD-10-CM | POA: Diagnosis not present

## 2023-08-28 DIAGNOSIS — E782 Mixed hyperlipidemia: Secondary | ICD-10-CM

## 2023-08-28 DIAGNOSIS — E039 Hypothyroidism, unspecified: Secondary | ICD-10-CM

## 2023-08-28 DIAGNOSIS — E119 Type 2 diabetes mellitus without complications: Secondary | ICD-10-CM

## 2023-08-28 LAB — POCT GLYCOSYLATED HEMOGLOBIN (HGB A1C): Hemoglobin A1C: 8.6 % — AB (ref 4.0–5.6)

## 2023-08-28 LAB — POCT UA - MICROALBUMIN
Creatinine, POC: 300 mg/dL
Microalbumin Ur, POC: 150 mg/L

## 2023-08-28 MED ORDER — SEMAGLUTIDE (2 MG/DOSE) 8 MG/3ML ~~LOC~~ SOPN: 2.0000 mg | PEN_INJECTOR | SUBCUTANEOUS | 3 refills | Status: DC

## 2023-08-28 MED ORDER — INSULIN GLARGINE 100 UNIT/ML SOLOSTAR PEN: 80.0000 [IU] | PEN_INJECTOR | Freq: Every day | SUBCUTANEOUS | 3 refills | Status: DC

## 2023-08-28 MED ORDER — LEVOTHYROXINE SODIUM 125 MCG PO TABS: 125.0000 ug | ORAL_TABLET | Freq: Every day | ORAL | 1 refills | Status: DC

## 2023-08-28 MED ORDER — INSULIN LISPRO (1 UNIT DIAL) 100 UNIT/ML (KWIKPEN): 10.0000 [IU] | PEN_INJECTOR | Freq: Three times a day (TID) | SUBCUTANEOUS | 3 refills | Status: DC

## 2023-08-28 MED ORDER — METFORMIN HCL ER 500 MG PO TB24: 1000.0000 mg | ORAL_TABLET | Freq: Every day | ORAL | 3 refills | Status: DC

## 2023-08-28 NOTE — Progress Notes (Signed)
Endocrinology Follow Up Note       08/28/2023, 8:22 AM   Subjective:    Patient ID: Sarah Pope, female    DOB: November 14, 1955.  Sarah Pope is being seen in follow up after being seen in consultation for management of currently uncontrolled symptomatic diabetes requested by  Donetta Potts, MD.   Past Medical History:  Diagnosis Date   Acute respiratory failure with hypercapnia (HCC)    Allergy    Anxiety    Aortic stenosis    a. S/p AVR on 02/06/2022 using a 21 mm Edwards Inspiris Resilina pericardial valve   Asthma    Bronchitis with bronchospasm    Cancer (HCC)    skin   Depression    DM type 2 (diabetes mellitus, type 2) (HCC)    FATTY LIVER DISEASE 08/05/2008   GERD (gastroesophageal reflux disease)    Hyperlipidemia    Hypertension    Hypothyroidism    Kidney stones    Leukocytosis    Obesity, Class III, BMI 40-49.9 (morbid obesity) (HCC)    PERSONAL HX COLONIC POLYPS 09/01/2008   PONV (postoperative nausea and vomiting)    Seasonal allergies    TRANSAMINASES, SERUM, ELEVATED 08/05/2008    Past Surgical History:  Procedure Laterality Date   AORTIC VALVE REPLACEMENT N/A 02/06/2022   Procedure: AORTIC VALVE REPLACEMENT (AVR) WITH 21 MM INSIPIRIS AORTIC VALVE.;  Surgeon: Alleen Borne, MD;  Location: [facility] OR;  Service: Open Heart Surgery;  Laterality: N/A;   CATARACT EXTRACTION W/PHACO Right 02/23/2023   Procedure: CATARACT EXTRACTION PHACO AND INTRAOCULAR LENS PLACEMENT (IOC);  Surgeon: Fabio Pierce, MD;  Location: [facility];  Service: Ophthalmology;  Laterality: Right;  CDE: 4.99   CATARACT EXTRACTION W/PHACO Left 03/09/2023   Procedure: CATARACT EXTRACTION PHACO AND INTRAOCULAR LENS PLACEMENT (IOC);  Surgeon: Fabio Pierce, MD;  Location: [facility];  Service: Ophthalmology;  Laterality: Left;  CDE:6.12   COLONOSCOPY  2013   Repeat 10 years   MANDIBLE SURGERY  1988   realignment   NOSE  SURGERY  2007   sinus infections   POLYPECTOMY     RIGHT HEART CATH AND CORONARY ANGIOGRAPHY N/A 12/01/2021   Procedure: RIGHT HEART CATH AND CORONARY ANGIOGRAPHY;  Surgeon: Kathleene Hazel, MD;  Location: [facility];  Service: Cardiovascular;  Laterality: N/A;   skin cancer removal     TEE WITHOUT CARDIOVERSION N/A 02/06/2022   Procedure: TRANSESOPHAGEAL ECHOCARDIOGRAM (TEE);  Surgeon: Alleen Borne, MD;  Location: [facility] OR;  Service: Open Heart Surgery;  Laterality: N/A;   TONSILLECTOMY      Social History   Socioeconomic History   Marital status: Divorced    Spouse name: Not on file   Number of children: Not on file   Years of education: Not on file   Highest education level: Not on file  Occupational History   Occupation: Lead Clerk at [employer]  Tobacco Use   Smoking status: Former    Current packs/day: 0.00    Average packs/day: 0.5 packs/day for 5.0 years (2.5 ttl pk-yrs)    Types: Cigarettes    Start date: 10/23/1977    Quit date: 10/23/1982  Years since quitting: 40.8   Smokeless tobacco: Never  Vaping Use   Vaping status: Never Used  Substance and Sexual Activity   Alcohol use: No   Drug use: No   Sexual activity: Not on file  Other Topics Concern   Not on file  Social History Narrative   Not on file   Social Determinants of Health   Financial Resource Strain: Not on file  Food Insecurity: Not on file  Transportation Needs: Not on file  Physical Activity: Not on file  Stress: Not on file  Social Connections: Not on file    Family History  Problem Relation Age of Onset   Cancer Mother    Hypertension Mother    Thyroid disease Mother    Breast cancer Mother    Diabetes Mother    Prostate cancer Father    Heart attack Maternal Grandmother    Allergies Daughter    Colon cancer Neg Hx    Esophageal cancer Neg Hx    Rectal cancer Neg Hx    Stomach cancer Neg Hx    Colon polyps Neg Hx     Outpatient Encounter Medications as of  08/28/2023  Medication Sig   metFORMIN (GLUCOPHAGE-XR) 500 MG 24 hr tablet Take 2 tablets (1,000 mg total) by mouth daily with breakfast.   albuterol (VENTOLIN HFA) 108 (90 Base) MCG/ACT inhaler Inhale 2 puffs into the lungs every 6 (six) hours as needed for shortness of breath.   aspirin EC 81 MG tablet Take 81 mg by mouth daily. Swallow whole.   atorvastatin (LIPITOR) 20 MG tablet Take 1 tablet (20 mg total) by mouth daily.   citalopram (CELEXA) 40 MG tablet Take 1 tablet (40 mg total) by mouth daily.   Continuous Blood Gluc Receiver (FREESTYLE LIBRE 14 DAY READER) DEVI USE AS DIRECTED THREE TIMES DAILY.   Continuous Blood Gluc Sensor (FREESTYLE LIBRE 14 DAY SENSOR) MISC USE TO CHECK BLOOD SUGAR THREE TIMES DAILY WITH SLIDING SCALE INSULIN USE   Fluticasone-Salmeterol (ADVAIR DISKUS) 250-50 MCG/DOSE AEPB Inhale 1 puff into the lungs 2 (two) times daily. (Patient taking differently: Inhale 1 puff into the lungs 2 (two) times daily as needed (respiratory issues.).)   hydrochlorothiazide (HYDRODIURIL) 12.5 MG tablet Take 12.5 mg by mouth daily.   insulin glargine (LANTUS) 100 UNIT/ML Solostar Pen Inject 80 Units into the skin at bedtime.   insulin lispro (HUMALOG KWIKPEN) 100 UNIT/ML KwikPen Inject 10-16 Units into the skin 3 (three) times daily.   levothyroxine (SYNTHROID) 125 MCG tablet Take 1 tablet (125 mcg total) by mouth daily before breakfast.   metoprolol tartrate (LOPRESSOR) 25 MG tablet TAKE ONE TABLET BY MOUTH TWICE DAILY   potassium chloride SA (KLOR-CON M) 20 MEQ tablet Take 1 tablet (20 mEq total) by mouth daily.   Semaglutide, 2 MG/DOSE, 8 MG/3ML SOPN Inject 2 mg as directed once a week.   [DISCONTINUED] insulin glargine (LANTUS) 100 UNIT/ML Solostar Pen Inject 80 Units into the skin at bedtime.   [DISCONTINUED] insulin lispro (HUMALOG KWIKPEN) 100 UNIT/ML KwikPen Inject 8-14 Units into the skin 3 (three) times daily.   [DISCONTINUED] levothyroxine (SYNTHROID) 125 MCG tablet Take 1  tablet (125 mcg total) by mouth daily before breakfast.   [DISCONTINUED] metFORMIN (GLUCOPHAGE) 1000 MG tablet Take 1 tablet (1,000 mg total) by mouth 2 (two) times daily with a meal.   [DISCONTINUED] Semaglutide, 2 MG/DOSE, 8 MG/3ML SOPN Inject 2 mg as directed once a week.   No facility-administered encounter medications on file as  of 08/28/2023.    ALLERGIES: Allergies  Allergen Reactions   Codeine Nausea Only   Sulfa Antibiotics Itching    VACCINATION STATUS: Immunization History  Administered Date(s) Administered   Influenza Split 07/03/2012, 09/10/2013   Influenza,inj,Quad PF,6+ Mos 09/29/2015, 06/27/2016, 08/15/2017, 08/28/2018, 07/14/2019, 08/09/2020   Influenza-Unspecified 08/24/2011, 08/11/2020   Moderna Sars-Covid-2 Vaccination 04/05/2020, 04/28/2020   PNEUMOCOCCAL CONJUGATE-20 02/11/2022   Pneumococcal Polysaccharide-23 08/23/2004, 08/28/2018   Td 06/17/2008   Tdap 05/09/2019   Zoster Recombinant(Shingrix) 10/14/2019, 08/04/2020, 08/11/2020    Diabetes She presents for her follow-up diabetic visit. She has type 2 diabetes mellitus. Onset time: Diagnosed at approx age of 51. Her disease course has been improving. There are no hypoglycemic associated symptoms. Associated symptoms include fatigue, foot paresthesias and weight loss. Pertinent negatives for diabetes include no polydipsia and no polyuria. There are no hypoglycemic complications. Symptoms are stable. Diabetic complications include nephropathy and peripheral neuropathy. Risk factors for coronary artery disease include stress, diabetes mellitus, dyslipidemia, family history, obesity, hypertension, post-menopausal and sedentary lifestyle. Current diabetic treatment includes intensive insulin program and oral agent (monotherapy) (and Ozempic). She is compliant with treatment most of the time. Her weight is increasing steadily. She is following a generally unhealthy diet. When asked about meal planning, she reported none.  She has not had a previous visit with a dietitian. She rarely participates in exercise. Her home blood glucose trend is fluctuating minimally. Her overall blood glucose range is >200 mg/dl. (She presents today with her CGM showing slightly above target glycemic profile overall.  Her POCT A1c today is 8.6%, improving from last visit of 9.3%.  Analysis of her CGM shows TIR 31%, TAR 79%%, TBR 0% with a GMI of 8.5%.  She notes her energy level is still low.  Also she reports she has not been taking the second dose of Metformin due to stomach problems recently.) An ACE inhibitor/angiotensin II receptor blocker is being taken. She sees a podiatrist.Eye exam is not current.  Hyperlipidemia This is a chronic problem. The current episode started more than 1 year ago. The problem is uncontrolled. Recent lipid tests were reviewed and are variable. Exacerbating diseases include chronic renal disease, diabetes, hypothyroidism and obesity. Factors aggravating her hyperlipidemia include thiazides and fatty foods. Current antihyperlipidemic treatment includes statins. The current treatment provides moderate improvement of lipids. Compliance problems include adherence to diet and adherence to exercise.  Risk factors for coronary artery disease include diabetes mellitus, dyslipidemia, family history, hypertension, obesity, post-menopausal, a sedentary lifestyle and stress.  Hypertension This is a chronic problem. The current episode started more than 1 year ago. The problem has been resolved since onset. The problem is controlled. Agents associated with hypertension include thyroid hormones. Risk factors for coronary artery disease include diabetes mellitus, dyslipidemia, family history, post-menopausal state, sedentary lifestyle, stress and obesity. Past treatments include angiotensin blockers and diuretics. The current treatment provides significant improvement. Compliance problems include exercise and diet.  Hypertensive  end-organ damage includes kidney disease. Identifiable causes of hypertension include chronic renal disease and sleep apnea.     Review of systems  Constitutional: + decreasing body weight, current Body mass index is 33.12 kg/m., + fatigue-worsening, no subjective hyperthermia, no subjective hypothermia Eyes: no blurry vision, no xerophthalmia ENT: no sore throat, no nodules palpated in throat, no dysphagia/odynophagia, no hoarseness Cardiovascular: no chest pain, no shortness of breath, no palpitations, + intermittent leg swelling Respiratory: no cough, no shortness of breath Gastrointestinal: no nausea/vomiting/diarrhea Musculoskeletal: no muscle/joint aches Skin: no rashes, no hyperemia  Neurological: + resting tremors, + numbness/tingling/burning pain to bilateral feet, no dizziness Psychiatric: no depression, no anxiety  Objective:     BP 124/76   Pulse 76   Ht 5\' 5"  (1.651 m)   Wt 199 lb (90.3 kg)   BMI 33.12 kg/m   Wt Readings from Last 3 Encounters:  08/28/23 199 lb (90.3 kg)  07/24/23 209 lb 3.5 oz (94.9 kg)  05/11/23 209 lb 3.2 oz (94.9 kg)     BP Readings from Last 3 Encounters:  08/28/23 124/76  07/24/23 137/77  05/11/23 111/63     Physical Exam- Limited  Constitutional:  Body mass index is 33.12 kg/m. , not in acute distress, normal state of mind Eyes:  EOMI, no exophthalmos Musculoskeletal: no gross deformities, strength intact in all four extremities, no gross restriction of joint movements Skin:  no rashes, no hyperemia Neurological: mild resting tremor     Diabetic Foot Exam - Simple   Simple Foot Form Visual Inspection See comments: Yes Sensation Testing Intact to touch and monofilament testing bilaterally: Yes Pulse Check Posterior Tibialis and Dorsalis pulse intact bilaterally: Yes Comments Onychomycosis bilaterally     CMP ( most recent) CMP     Component Value Date/Time   NA 140 08/15/2023 1155   K 4.0 08/15/2023 1155   CL 100  08/15/2023 1155   CO2 25 08/15/2023 1155   GLUCOSE 144 (H) 08/15/2023 1155   GLUCOSE 176 (H) 07/24/2023 1713   BUN 15 08/15/2023 1155   CREATININE 0.85 08/15/2023 1155   CREATININE 0.64 02/05/2013 1148   CALCIUM 9.4 08/15/2023 1155   PROT 6.5 08/15/2023 1155   ALBUMIN 4.1 08/15/2023 1155   AST 20 08/15/2023 1155   ALT 19 08/15/2023 1155   ALKPHOS 84 08/15/2023 1155   BILITOT 0.9 08/15/2023 1155   GFRNONAA 57 (L) 07/24/2023 1713   GFRAA >60 05/31/2020 0930     Diabetic Labs (most recent): Lab Results  Component Value Date   HGBA1C 8.6 (A) 08/28/2023   HGBA1C 9.3 (A) 05/11/2023   HGBA1C 8.7 (A) 01/31/2023   MICROALBUR 150 08/28/2023   MICROALBUR 1.20 02/05/2013     Lipid Panel ( most recent) Lipid Panel     Component Value Date/Time   CHOL 165 04/18/2021 0943   TRIG 159 (H) 04/18/2021 0943   HDL 45 04/18/2021 0943   CHOLHDL 3.7 04/18/2021 0943   CHOLHDL 3.4 12/16/2019 0905   VLDL 22 12/16/2019 0905   LDLCALC 92 04/18/2021 0943   LABVLDL 28 04/18/2021 0943      Lab Results  Component Value Date   TSH 1.360 08/15/2023   TSH 4.50 05/09/2023   TSH 5.150 (H) 04/18/2021   TSH 1.503 12/16/2019   TSH 1.360 07/01/2019   TSH 2.480 04/01/2018   TSH 2.650 10/03/2016   TSH 3.620 10/06/2015   TSH 4.248 02/05/2013   TSH 3.189 06/30/2012   FREET4 1.17 08/15/2023          Assessment & Plan:   1) Type 2 diabetes mellitus without complication, with long-term current use of insulin (HCC)  She presents today with her CGM showing slightly above target glycemic profile overall.  Her POCT A1c today is 8.6%, improving from last visit of 9.3%.  Analysis of her CGM shows TIR 31%, TAR 79%%, TBR 0% with a GMI of 8.5%.  She notes her energy level is still low.  Also she reports she has not been taking the second dose of Metformin due to stomach problems recently.  -  Sarah Pope has currently uncontrolled symptomatic type 2 DM since 67 years of age.  -Recent labs reviewed.   Her POCT UM shows mild microalbuminuria.  Her CMP shows stable kidney function.  - I had a long discussion with her about the progressive nature of diabetes and the pathology behind its complications. -her diabetes is complicated by mild CKD, neuropathy and she remains at a high risk for more acute and chronic complications which include CAD, CVA, CKD, retinopathy, and neuropathy. These are all discussed in detail with her.  - Nutritional counseling repeated at each appointment due to patients tendency to fall back in to old habits.  - The patient admits there is a room for improvement in their diet and drink choices. -  Suggestion is made for the patient to avoid simple carbohydrates from their diet including Cakes, Sweet Desserts / Pastries, Ice Cream, Soda (diet and regular), Sweet Tea, Candies, Chips, Cookies, Sweet Pastries, Store Bought Juices, Alcohol in Excess of 1-2 drinks a day, Artificial Sweeteners, Coffee Creamer, and "Sugar-free" Products. This will help patient to have stable blood glucose profile and potentially avoid unintended weight gain.   - I encouraged the patient to switch to unprocessed or minimally processed complex starch and increased protein intake (animal or plant source), fruits, and vegetables.   - Patient is advised to stick to a routine mealtimes to eat 3 meals a day and avoid unnecessary snacks (to snack only to correct hypoglycemia).  - I have approached her with the following individualized plan to manage her diabetes and patient agrees:   -She is advised to continue Lantus 80 units SQ nightly, increase her Humalog to 10-16 units TID with meals if glucose is above 90 and she is eating (Specific instructions on how to titrate insulin dosage based on glucose readings given to patient in writing), Ozempic 2 mg SQ weekly.  Will change her Metformin to 1000 mg ER daily after breakfast to help with GI side effects.  -she is encouraged to start consistently using her  CGM to monitor glucose 4 times daily, before meals and before bed, and to call the clinic if she has readings less than 70 or above 300 for 3 tests in a row.  She is loving her upgrade to [place] 3.  - she is warned not to take insulin without proper monitoring per orders. - Adjustment parameters are given to her for hypo and hyperglycemia in writing.  -She has allergy to sulfa meds.  - Specific targets for  A1c; LDL, HDL, and Triglycerides were discussed with the patient.  2) Blood Pressure /Hypertension:  her blood pressure is controlled to target.   she is advised to continue her current medications including Metoprolol 25 po daily and Losartan 25 mg po daily.  She has follow up with her cardiologist today.  3) Lipids/Hyperlipidemia:    Review of her recent lipid panel from 08/22/22 showed controlled LDL at 68.  she is advised to continue Lipitor 20 mg daily at bedtime.  Side effects and precautions discussed with her.    4)  Weight/Diet:  her Body mass index is 33.12 kg/m.  -  clearly complicating her diabetes care.   she is a candidate for weight loss. I discussed with her the fact that loss of 5 - 10% of her  current body weight will have the most impact on her diabetes management.  Exercise, and detailed carbohydrates information provided  -  detailed on discharge instructions.  5) Hypothyroidism- suspect r/t  [place]imotos given extensive family history Her previsit TFTs are consistent with appropriate hormone replacement.  She is advised to continue Levothyroxine 125 mcg po daily before breakfast.   - The correct intake of thyroid hormone (Levothyroxine, Synthroid), is on empty stomach first thing in the morning, with water, separated by at least 30 minutes from breakfast and other medications,  and separated by more than 4 hours from calcium, iron, multivitamins, acid reflux medications (PPIs).  - This medication is a life-long medication and will be needed to correct thyroid hormone  imbalances for the rest of your life.  The dose may change from time to time, based on thyroid blood work.  - It is extremely important to be consistent taking this medication, near the same time each morning.  -AVOID TAKING PRODUCTS CONTAINING BIOTIN (commonly found in Hair, Skin, Nails vitamins) AS IT INTERFERES WITH THE VALIDITY OF THYROID FUNCTION BLOOD TESTS.  6) Chronic Care/Health Maintenance: -she is on ACEI/ARB and Statin medications and is encouraged to initiate and continue to follow up with Ophthalmology, Dentist, Podiatrist at least yearly or according to recommendations, and advised to stay away from smoking. I have recommended yearly flu vaccine and pneumonia vaccine at least every 5 years; moderate intensity exercise for up to 150 minutes weekly; and sleep for at least 7 hours a day.  - she is advised to maintain close follow up with Donetta Potts, MD for primary care needs, as well as her other providers for optimal and coordinated care.      I spent  38  minutes in the care of the patient today including review of labs from CMP, Lipids, Thyroid Function, Hematology (current and previous including abstractions from other facilities); face-to-face time discussing  her blood glucose readings/logs, discussing hypoglycemia and hyperglycemia episodes and symptoms, medications doses, her options of short and long term treatment based on the latest standards of care / guidelines;  discussion about incorporating lifestyle medicine;  and documenting the encounter. Risk reduction counseling performed per USPSTF guidelines to reduce obesity and cardiovascular risk factors.     Please refer to Patient Instructions for Blood Glucose Monitoring and Insulin/Medications Dosing Guide"  in media tab for additional information. Please  also refer to " Patient Self Inventory" in the Media  tab for reviewed elements of pertinent patient history.  Sarah Pope participated in the discussions,  expressed understanding, and voiced agreement with the above plans.  All questions were answered to her satisfaction. she is encouraged to contact clinic should she have any questions or concerns prior to her return visit.     Follow up plan: - Return in about 4 months (around 12/26/2023) for Diabetes F/U with A1c in office, No previsit labs, Bring meter and logs.  Ronny Bacon, [facility] [address] [place], [place] [address] Phone: [phone] Fax: [phone]  08/28/2023, 8:22 AM

## 2023-08-28 NOTE — Patient Instructions (Signed)

## 2023-09-04 DIAGNOSIS — E08649 Diabetes mellitus due to underlying condition with hypoglycemia without coma: Secondary | ICD-10-CM | POA: Diagnosis not present

## 2023-10-04 DIAGNOSIS — E08649 Diabetes mellitus due to underlying condition with hypoglycemia without coma: Secondary | ICD-10-CM | POA: Diagnosis not present

## 2023-10-30 ENCOUNTER — Other Ambulatory Visit: Payer: Self-pay | Admitting: Cardiology

## 2023-11-04 DIAGNOSIS — E08649 Diabetes mellitus due to underlying condition with hypoglycemia without coma: Secondary | ICD-10-CM | POA: Diagnosis not present

## 2023-12-05 DIAGNOSIS — E08649 Diabetes mellitus due to underlying condition with hypoglycemia without coma: Secondary | ICD-10-CM | POA: Diagnosis not present

## 2023-12-14 ENCOUNTER — Other Ambulatory Visit: Payer: Self-pay | Admitting: Nurse Practitioner

## 2023-12-20 ENCOUNTER — Encounter: Payer: Self-pay | Admitting: Nurse Practitioner

## 2023-12-20 ENCOUNTER — Ambulatory Visit: Payer: PPO | Attending: Nurse Practitioner | Admitting: Nurse Practitioner

## 2023-12-20 VITALS — BP 130/70 | HR 83 | Ht 65.0 in | Wt 201.8 lb

## 2023-12-20 DIAGNOSIS — J45909 Unspecified asthma, uncomplicated: Secondary | ICD-10-CM | POA: Diagnosis not present

## 2023-12-20 DIAGNOSIS — Z952 Presence of prosthetic heart valve: Secondary | ICD-10-CM | POA: Diagnosis not present

## 2023-12-20 DIAGNOSIS — I10 Essential (primary) hypertension: Secondary | ICD-10-CM | POA: Diagnosis not present

## 2023-12-20 DIAGNOSIS — E785 Hyperlipidemia, unspecified: Secondary | ICD-10-CM | POA: Diagnosis not present

## 2023-12-20 DIAGNOSIS — I6523 Occlusion and stenosis of bilateral carotid arteries: Secondary | ICD-10-CM

## 2023-12-20 DIAGNOSIS — E030 Congenital hypothyroidism with diffuse goiter: Secondary | ICD-10-CM | POA: Diagnosis not present

## 2023-12-20 DIAGNOSIS — Z6833 Body mass index (BMI) 33.0-33.9, adult: Secondary | ICD-10-CM | POA: Diagnosis not present

## 2023-12-20 DIAGNOSIS — K219 Gastro-esophageal reflux disease without esophagitis: Secondary | ICD-10-CM | POA: Diagnosis not present

## 2023-12-20 DIAGNOSIS — I2510 Atherosclerotic heart disease of native coronary artery without angina pectoris: Secondary | ICD-10-CM | POA: Diagnosis not present

## 2023-12-20 NOTE — Patient Instructions (Addendum)
 Medication Instructions:  Your physician recommends that you continue on your current medications as directed. Please refer to the Current Medication list given to you today.  Labwork: None   Testing/Procedures: Your physician has requested that you have an echocardiogram. Echocardiography is a painless test that uses sound waves to create images of your heart. It provides your doctor with information about the size and shape of your heart and how well your heart's chambers and valves are working. This procedure takes approximately one hour. There are no restrictions for this procedure. Please do NOT wear cologne, perfume, aftershave, or lotions (deodorant is allowed). Please arrive 15 minutes prior to your appointment time.  Please note: We ask at that you not bring children with you during ultrasound (echo/ vascular) testing. Due to room size and safety concerns, children are not allowed in the ultrasound rooms during exams. Our front office staff cannot provide observation of children in our lobby area while testing is being conducted. An adult accompanying a patient to their appointment will only be allowed in the ultrasound room at the discretion of the ultrasound technician under special circumstances. We apologize for any inconvenience.  Follow-Up: Your physician recommends that you schedule a follow-up appointment in: 1 Year   Any Other Special Instructions Will Be Listed Below (If Applicable).  If you need a refill on your cardiac medications before your next appointment, please call your pharmacy.

## 2023-12-20 NOTE — Progress Notes (Signed)
 Cardiology Office Note:  .   Date:  12/20/2023 ID:  DABNEY DEVER, DOB 28-Nov-1955, MRN [id number] PCP: Donetta Potts, MD  [facility] Providers Cardiologist:  Dina Rich, MD    History of Present Illness: .   Sarah Pope is a 68 y.o. female with a PMH of aortic valve stenosis, s/p SAVR in 2023, hypertension, hyperlipidemia, and carotid artery stenosis, who presents today for scheduled follow-up.  Last seen by Dr. Dina Rich on October 18, 2022.  Was doing well at the time.  Today she presents for scheduled follow-up.  She states she is doing well. Denies any chest pain, shortness of breath, palpitations, syncope, presyncope, dizziness, orthopnea, PND, swelling or significant weight changes, acute bleeding, or claudication.   ROS: Negative. See HPI.   Studies Reviewed: Marland Kitchen    EKG: EKG is not ordered today.  Echo 02/2022:  1. Left ventricular ejection fraction, by estimation, is 60 to 65%. The  left ventricle has normal function. The left ventricle has no regional  wall motion abnormalities. There is mild left ventricular hypertrophy.  Left ventricular diastolic parameters  are consistent with Grade I diastolic dysfunction (impaired relaxation).   2. Right ventricular systolic function is normal. The right ventricular  size is normal. There is normal pulmonary artery systolic pressure.   3. The mitral valve is normal in structure. No evidence of mitral valve  regurgitation. No evidence of mitral stenosis.   4. 21 mm Edwards INSPIRIS RESILIA pericardial valve. The aortic valve has been repaired/replaced. Aortic valve regurgitation is not visualized. No aortic stenosis is present.  Right/left heart cath 11/2021: No angiographic evidence of CAD Normal right heart pressures   Continue workup for AVR. Given her young age and low surgical risk, she may be better served with surgical AVR than TAVR. Will proceed with CT scans and will have her seen by Dr. Laneta Simmers.    Risk Assessment/Calculations:       The 10-year ASCVD risk score (Arnett DK, et al., 2019) is: 15.6%   Values used to calculate the score:     Age: 76 years     Sex: Female     Is Non-Hispanic African American: No     Diabetic: Yes     Tobacco smoker: No     Systolic Blood Pressure: 130 mmHg     Is BP treated: Yes     HDL Cholesterol: 45 mg/dL     Total Cholesterol: 165 mg/dL  Physical Exam:   VS:  BP 130/70   Pulse 83   Ht 5\' 5"  (1.651 m)   Wt 201 lb 12.8 oz (91.5 kg)   SpO2 96%   BMI 33.58 kg/m    Wt Readings from Last 3 Encounters:  12/20/23 201 lb 12.8 oz (91.5 kg)  08/28/23 199 lb (90.3 kg)  07/24/23 209 lb 3.5 oz (94.9 kg)    GEN: Obese, 68 y.o. female in no acute distress NECK: No JVD; No carotid bruits CARDIAC: S1/S2, RRR, no murmurs, rubs, gallops RESPIRATORY:  Clear to auscultation without rales, wheezing or rhonchi  ABDOMEN: Soft, non-tender, non-distended EXTREMITIES:  No edema; No deformity   ASSESSMENT AND PLAN: .    Aortic valve stenosis, s/p SAVR Doing well. TTE in 2023 revealed normal functioning valve. Will update Echo at this time. SBE prophylaxis discussed. Continue current medication regimen.   HTN BP stable. Discussed to monitor BP at home at least 2 hours after medications and sitting for 5-10 minutes. No medication changes  at this time. Heart healthy diet and regular cardiovascular exercise encouraged.   HLD No recent labs on file. Will request copy of labs from PCP's office. Continue atorvastatin. Heart healthy diet and regular cardiovascular exercise encouraged.   Carotid artery stenosis 1-39% stenosis bilaterally noted on carotid duplex 01/2022. Continue current medication regimen. Heart healthy diet and regular cardiovascular exercise encouraged.    Dispo: Follow-up with Dr. Wyline Mood or APP in 1 year or sooner if anything changes.   Signed, Sharlene Dory, NP

## 2024-01-01 ENCOUNTER — Encounter: Payer: Self-pay | Admitting: Nurse Practitioner

## 2024-01-01 ENCOUNTER — Ambulatory Visit (INDEPENDENT_AMBULATORY_CARE_PROVIDER_SITE_OTHER): Payer: PPO | Admitting: Nurse Practitioner

## 2024-01-01 VITALS — BP 110/70 | HR 74 | Ht 65.0 in | Wt 199.6 lb

## 2024-01-01 DIAGNOSIS — I10 Essential (primary) hypertension: Secondary | ICD-10-CM

## 2024-01-01 DIAGNOSIS — Z7984 Long term (current) use of oral hypoglycemic drugs: Secondary | ICD-10-CM

## 2024-01-01 DIAGNOSIS — E119 Type 2 diabetes mellitus without complications: Secondary | ICD-10-CM

## 2024-01-01 DIAGNOSIS — E039 Hypothyroidism, unspecified: Secondary | ICD-10-CM

## 2024-01-01 DIAGNOSIS — Z7985 Long-term (current) use of injectable non-insulin antidiabetic drugs: Secondary | ICD-10-CM

## 2024-01-01 DIAGNOSIS — Z794 Long term (current) use of insulin: Secondary | ICD-10-CM | POA: Diagnosis not present

## 2024-01-01 DIAGNOSIS — E782 Mixed hyperlipidemia: Secondary | ICD-10-CM | POA: Diagnosis not present

## 2024-01-01 DIAGNOSIS — R5382 Chronic fatigue, unspecified: Secondary | ICD-10-CM

## 2024-01-01 LAB — POCT GLYCOSYLATED HEMOGLOBIN (HGB A1C): Hemoglobin A1C: 8.6 % — AB (ref 4.0–5.6)

## 2024-01-01 MED ORDER — METFORMIN HCL ER 500 MG PO TB24: 500.0000 mg | ORAL_TABLET | Freq: Every day | ORAL | 3 refills | Status: AC

## 2024-01-01 NOTE — Progress Notes (Signed)
 Endocrinology Follow Up Note       01/01/2024, 8:24 AM   Subjective:    Patient ID: Sarah Pope, female    DOB: [birth year].  Sarah Pope is being seen in follow up after being seen in consultation for management of currently uncontrolled symptomatic diabetes requested by  Donetta Potts, MD.   Past Medical History:  Diagnosis Date   Acute respiratory failure with hypercapnia (HCC)    Allergy    Anxiety    Aortic stenosis    a. S/p AVR on 02/06/2022 using a 21 mm Edwards Inspiris Resilina pericardial valve   Asthma    Bronchitis with bronchospasm    Cancer (HCC)    skin   Depression    DM type 2 (diabetes mellitus, type 2) (HCC)    FATTY LIVER DISEASE 08/05/2008   GERD (gastroesophageal reflux disease)    Hyperlipidemia    Hypertension    Hypothyroidism    Kidney stones    Leukocytosis    Obesity, Class III, BMI 40-49.9 (morbid obesity) (HCC)    PERSONAL HX COLONIC POLYPS 09/01/2008   PONV (postoperative nausea and vomiting)    Seasonal allergies    TRANSAMINASES, SERUM, ELEVATED 08/05/2008    Past Surgical History:  Procedure Laterality Date   AORTIC VALVE REPLACEMENT N/A 02/06/2022   Procedure: AORTIC VALVE REPLACEMENT (AVR) WITH 21 MM INSIPIRIS AORTIC VALVE.;  Surgeon: Alleen Borne, MD;  Location: [facility] OR;  Service: Open Heart Surgery;  Laterality: N/A;   CATARACT EXTRACTION W/PHACO Right 02/23/2023   Procedure: CATARACT EXTRACTION PHACO AND INTRAOCULAR LENS PLACEMENT (IOC);  Surgeon: Fabio Pierce, MD;  Location: [facility] ORS;  Service: Ophthalmology;  Laterality: Right;  CDE: 4.99   CATARACT EXTRACTION W/PHACO Left 03/09/2023   Procedure: CATARACT EXTRACTION PHACO AND INTRAOCULAR LENS PLACEMENT (IOC);  Surgeon: Fabio Pierce, MD;  Location: [facility];  Service: Ophthalmology;  Laterality: Left;  CDE:6.12   COLONOSCOPY  2013   Repeat 10 years   MANDIBLE SURGERY  1988   realignment   NOSE  SURGERY  2007   sinus infections   POLYPECTOMY     RIGHT HEART CATH AND CORONARY ANGIOGRAPHY N/A 12/01/2021   Procedure: RIGHT HEART CATH AND CORONARY ANGIOGRAPHY;  Surgeon: Kathleene Hazel, MD;  Location: [facility] INVASIVE CV LAB;  Service: Cardiovascular;  Laterality: N/A;   skin cancer removal     TEE WITHOUT CARDIOVERSION N/A 02/06/2022   Procedure: TRANSESOPHAGEAL ECHOCARDIOGRAM (TEE);  Surgeon: Alleen Borne, MD;  Location: [facility] OR;  Service: Open Heart Surgery;  Laterality: N/A;   TONSILLECTOMY      Social History   Socioeconomic History   Marital status: Divorced    Spouse name: Not on file   Number of children: Not on file   Years of education: Not on file   Highest education level: Not on file  Occupational History   Occupation: Lead Clerk at [employer]  Tobacco Use   Smoking status: Former    Current packs/day: 0.00    Average packs/day: 0.5 packs/day for 5.0 years (2.5 ttl pk-yrs)    Types: Cigarettes    Start date: 10/23/1977    Quit date: 10/23/1982  Years since quitting: 41.2   Smokeless tobacco: Never  Vaping Use   Vaping status: Never Used  Substance and Sexual Activity   Alcohol use: No   Drug use: No   Sexual activity: Not on file  Other Topics Concern   Not on file  Social History Narrative   Not on file   Social Drivers of Health   Financial Resource Strain: Not on file  Food Insecurity: Not on file  Transportation Needs: Not on file  Physical Activity: Not on file  Stress: Not on file  Social Connections: Not on file    Family History  Problem Relation Age of Onset   Cancer Mother    Hypertension Mother    Thyroid disease Mother    Breast cancer Mother    Diabetes Mother    Prostate cancer Father    Heart attack Maternal Grandmother    Allergies Daughter    Colon cancer Neg Hx    Esophageal cancer Neg Hx    Rectal cancer Neg Hx    Stomach cancer Neg Hx    Colon polyps Neg Hx     Outpatient Encounter Medications as of 01/01/2024   Medication Sig   albuterol (VENTOLIN HFA) 108 (90 Base) MCG/ACT inhaler Inhale 2 puffs into the lungs every 6 (six) hours as needed for shortness of breath.   aspirin EC 81 MG tablet Take 81 mg by mouth daily. Swallow whole.   atorvastatin (LIPITOR) 20 MG tablet Take 1 tablet (20 mg total) by mouth daily.   citalopram (CELEXA) 40 MG tablet Take 1 tablet (40 mg total) by mouth daily.   Continuous Blood Gluc Receiver (FREESTYLE LIBRE 14 DAY READER) DEVI USE AS DIRECTED THREE TIMES DAILY.   Fluticasone-Salmeterol (ADVAIR DISKUS) 250-50 MCG/DOSE AEPB Inhale 1 puff into the lungs 2 (two) times daily. (Patient taking differently: Inhale 1 puff into the lungs 2 (two) times daily as needed (respiratory issues.).)   hydrochlorothiazide (HYDRODIURIL) 12.5 MG tablet Take 12.5 mg by mouth daily.   insulin glargine (LANTUS) 100 UNIT/ML Solostar Pen Inject 80 Units into the skin at bedtime.   insulin lispro (HUMALOG KWIKPEN) 100 UNIT/ML KwikPen Inject 10-16 Units into the skin 3 (three) times daily.   levothyroxine (SYNTHROID) 125 MCG tablet TAKE 1 TABLET(125 MCG) BY MOUTH DAILY BEFORE BREAKFAST   metoprolol tartrate (LOPRESSOR) 25 MG tablet TAKE ONE TABLET BY MOUTH TWICE DAILY   omeprazole (PRILOSEC) 40 MG capsule Take 40 mg by mouth daily.   ondansetron (ZOFRAN-ODT) 4 MG disintegrating tablet Take 4 mg by mouth daily as needed for vomiting or nausea.   Semaglutide, 2 MG/DOSE, 8 MG/3ML SOPN Inject 2 mg as directed once a week.   [DISCONTINUED] metFORMIN (GLUCOPHAGE-XR) 500 MG 24 hr tablet Take 2 tablets (1,000 mg total) by mouth daily with breakfast.   Continuous Blood Gluc Sensor (FREESTYLE LIBRE 14 DAY SENSOR) MISC USE TO CHECK BLOOD SUGAR THREE TIMES DAILY WITH SLIDING SCALE INSULIN USE (Patient not taking: Reported on 01/01/2024)   doxycycline (VIBRAMYCIN) 100 MG capsule Take 100 mg by mouth 2 (two) times daily. (Patient not taking: Reported on 01/01/2024)   metFORMIN (GLUCOPHAGE-XR) 500 MG 24 hr tablet  Take 1 tablet (500 mg total) by mouth daily with breakfast.   potassium chloride SA (KLOR-CON M) 20 MEQ tablet Take 1 tablet (20 mEq total) by mouth daily. (Patient not taking: Reported on 01/01/2024)   No facility-administered encounter medications on file as of 01/01/2024.    ALLERGIES: Allergies  Allergen Reactions  Codeine Nausea Only   Sulfa Antibiotics Itching    VACCINATION STATUS: Immunization History  Administered Date(s) Administered   Influenza Split 07/03/2012, 09/10/2013   Influenza,inj,Quad PF,6+ Mos 09/29/2015, 06/27/2016, 08/15/2017, 08/28/2018, 07/14/2019, 08/09/2020   Influenza-Unspecified 08/24/2011, 08/11/2020   Moderna Sars-Covid-2 Vaccination 04/05/2020, 04/28/2020   PNEUMOCOCCAL CONJUGATE-20 02/11/2022   Pneumococcal Polysaccharide-23 08/23/2004, 08/28/2018   Td 06/17/2008   Tdap 05/09/2019   Zoster Recombinant(Shingrix) 10/14/2019, 08/04/2020, 08/11/2020    Diabetes She presents for her follow-up diabetic visit. She has type 2 diabetes mellitus. Onset time: Diagnosed at approx age of 53. Her disease course has been fluctuating. There are no hypoglycemic associated symptoms. Associated symptoms include fatigue, foot paresthesias and weight loss. Pertinent negatives for diabetes include no polydipsia and no polyuria. There are no hypoglycemic complications. Symptoms are stable. Diabetic complications include nephropathy and peripheral neuropathy. Risk factors for coronary artery disease include stress, diabetes mellitus, dyslipidemia, family history, obesity, hypertension, post-menopausal and sedentary lifestyle. Current diabetic treatment includes intensive insulin program and oral agent (monotherapy) (and Ozempic). She is compliant with treatment most of the time. Her weight is fluctuating minimally. She is following a generally unhealthy diet. When asked about meal planning, she reported none. She has not had a previous visit with a dietitian. She rarely  participates in exercise. Her home blood glucose trend is fluctuating minimally. Her overall blood glucose range is >200 mg/dl. (She presents today with her CGM showing slightly above target glycemic profile overall.  Her POCT A1c today is 8.6%, unchanged from last visit.  Analysis of her CGM shows TIR 29%, TAR 71%, TBR 0% with a GMI of 8.3%.  She notes her energy level is still low.  She was on antibiotic for sinus infection recently and also had COVID between visits.) An ACE inhibitor/angiotensin II receptor blocker is being taken. She sees a podiatrist.Eye exam is not current.  Hyperlipidemia This is a chronic problem. The current episode started more than 1 year ago. The problem is uncontrolled. Recent lipid tests were reviewed and are variable. Exacerbating diseases include chronic renal disease, diabetes, hypothyroidism and obesity. Factors aggravating her hyperlipidemia include thiazides and fatty foods. Current antihyperlipidemic treatment includes statins. The current treatment provides moderate improvement of lipids. Compliance problems include adherence to diet and adherence to exercise.  Risk factors for coronary artery disease include diabetes mellitus, dyslipidemia, family history, hypertension, obesity, post-menopausal, a sedentary lifestyle and stress.  Hypertension This is a chronic problem. The current episode started more than 1 year ago. The problem has been resolved since onset. The problem is controlled. Agents associated with hypertension include thyroid hormones. Risk factors for coronary artery disease include diabetes mellitus, dyslipidemia, family history, post-menopausal state, sedentary lifestyle, stress and obesity. Past treatments include angiotensin blockers and diuretics. The current treatment provides significant improvement. Compliance problems include exercise and diet.  Hypertensive end-organ damage includes kidney disease. Identifiable causes of hypertension include chronic  renal disease and sleep apnea.     Review of systems  Constitutional: + stable body weight, current Body mass index is 33.22 kg/m., + fatigue-continued, no subjective hyperthermia, no subjective hypothermia Eyes: no blurry vision, no xerophthalmia ENT: no sore throat, no nodules palpated in throat, no dysphagia/odynophagia, no hoarseness Cardiovascular: no chest pain, no shortness of breath, no palpitations, + intermittent leg swelling Respiratory: no cough, no shortness of breath Gastrointestinal: no nausea/vomiting/diarrhea Musculoskeletal: no muscle/joint aches Skin: no rashes, no hyperemia Neurological: + resting tremors, + numbness/tingling/burning pain to bilateral feet, no dizziness Psychiatric: no depression, no anxiety  Objective:     BP 110/70 (BP Location: Left Arm, Patient Position: Sitting, Cuff Size: Large)   Pulse 74   Ht 5\' 5"  (1.651 m)   Wt 199 lb 9.6 oz (90.5 kg)   BMI 33.22 kg/m   Wt Readings from Last 3 Encounters:  01/01/24 199 lb 9.6 oz (90.5 kg)  12/20/23 201 lb 12.8 oz (91.5 kg)  08/28/23 199 lb (90.3 kg)     BP Readings from Last 3 Encounters:  01/01/24 110/70  12/20/23 130/70  08/28/23 124/76     Physical Exam- Limited  Constitutional:  Body mass index is 33.22 kg/m. , not in acute distress, normal state of mind Eyes:  EOMI, no exophthalmos Musculoskeletal: no gross deformities, strength intact in all four extremities, no gross restriction of joint movements Skin:  no rashes, no hyperemia Neurological: mild resting tremor     Diabetic Foot Exam - Simple   No data filed     CMP ( most recent) CMP     Component Value Date/Time   NA 140 08/15/2023 1155   K 4.0 08/15/2023 1155   CL 100 08/15/2023 1155   CO2 25 08/15/2023 1155   GLUCOSE 144 (H) 08/15/2023 1155   GLUCOSE 176 (H) 07/24/2023 1713   BUN 15 08/15/2023 1155   CREATININE 0.85 08/15/2023 1155   CREATININE 0.64 02/05/2013 1148   CALCIUM 9.4 08/15/2023 1155   PROT 6.5  08/15/2023 1155   ALBUMIN 4.1 08/15/2023 1155   AST 20 08/15/2023 1155   ALT 19 08/15/2023 1155   ALKPHOS 84 08/15/2023 1155   BILITOT 0.9 08/15/2023 1155   GFRNONAA 57 (L) 07/24/2023 1713   GFRAA >60 05/31/2020 0930     Diabetic Labs (most recent): Lab Results  Component Value Date   HGBA1C 8.6 (A) 01/01/2024   HGBA1C 8.6 (A) 08/28/2023   HGBA1C 9.3 (A) 05/11/2023   MICROALBUR 150 08/28/2023   MICROALBUR 1.20 02/05/2013     Lipid Panel ( most recent) Lipid Panel     Component Value Date/Time   CHOL 165 04/18/2021 0943   TRIG 159 (H) 04/18/2021 0943   HDL 45 04/18/2021 0943   CHOLHDL 3.7 04/18/2021 0943   CHOLHDL 3.4 12/16/2019 0905   VLDL 22 12/16/2019 0905   LDLCALC 92 04/18/2021 0943   LABVLDL 28 04/18/2021 0943      Lab Results  Component Value Date   TSH 1.360 08/15/2023   TSH 4.50 05/09/2023   TSH 5.150 (H) 04/18/2021   TSH 1.503 12/16/2019   TSH 1.360 07/01/2019   TSH 2.480 04/01/2018   TSH 2.650 10/03/2016   TSH 3.620 10/06/2015   TSH 4.248 02/05/2013   TSH 3.189 06/30/2012   FREET4 1.17 08/15/2023          Assessment & Plan:   1) Type 2 diabetes mellitus without complication, with long-term current use of insulin (HCC)  She presents today with her CGM showing slightly above target glycemic profile overall.  Her POCT A1c today is 8.6%, unchanged from last visit.  Analysis of her CGM shows TIR 29%, TAR 71%, TBR 0% with a GMI of 8.3%.  She notes her energy level is still low.  She was on antibiotic for sinus infection recently and also had COVID between visits.  - Shayanne H Ruark has currently uncontrolled symptomatic type 2 DM since 68 years of age.  -Recent labs reviewed.  - I had a long discussion with her about the progressive nature of diabetes and the pathology behind its  complications. -her diabetes is complicated by mild CKD, neuropathy and she remains at a high risk for more acute and chronic complications which include CAD, CVA, CKD,  retinopathy, and neuropathy. These are all discussed in detail with her.  - Nutritional counseling repeated at each appointment due to patients tendency to fall back in to old habits.  - The patient admits there is a room for improvement in their diet and drink choices. -  Suggestion is made for the patient to avoid simple carbohydrates from their diet including Cakes, Sweet Desserts / Pastries, Ice Cream, Soda (diet and regular), Sweet Tea, Candies, Chips, Cookies, Sweet Pastries, Store Bought Juices, Alcohol in Excess of 1-2 drinks a day, Artificial Sweeteners, Coffee Creamer, and "Sugar-free" Products. This will help patient to have stable blood glucose profile and potentially avoid unintended weight gain.   - I encouraged the patient to switch to unprocessed or minimally processed complex starch and increased protein intake (animal or plant source), fruits, and vegetables.   - Patient is advised to stick to a routine mealtimes to eat 3 meals a day and avoid unnecessary snacks (to snack only to correct hypoglycemia).  - I have approached her with the following individualized plan to manage her diabetes and patient agrees:   -She is advised to continue Lantus 80 units SQ nightly, continue Humalog 10-16 units TID with meals if glucose is above 90 and she is eating (Specific instructions on how to titrate insulin dosage based on glucose readings given to patient in writing), Ozempic 2 mg SQ weekly.  Will change her Metformin to 500 mg ER daily after breakfast to help with GI side effects.  -she is encouraged to start consistently using her CGM to monitor glucose 4 times daily, before meals and before bed, and to call the clinic if she has readings less than 70 or above 300 for 3 tests in a row.  She is loving her upgrade to Craig 3.  - she is warned not to take insulin without proper monitoring per orders. - Adjustment parameters are given to her for hypo and hyperglycemia in writing.  -She has  allergy to sulfa meds.  - Specific targets for  A1c; LDL, HDL, and Triglycerides were discussed with the patient.  2) Blood Pressure /Hypertension:  her blood pressure is controlled to target.   she is advised to continue her current medications including Metoprolol 25 po daily and Losartan 25 mg po daily.  She has follow up with her cardiologist today.  3) Lipids/Hyperlipidemia:    Review of her recent lipid panel from 08/22/22 showed controlled LDL at 68.  she is advised to continue Lipitor 20 mg daily at bedtime.  Side effects and precautions discussed with her.  Will recheck lipid panel prior to next visit.  4)  Weight/Diet:  her Body mass index is 33.22 kg/m.  -  clearly complicating her diabetes care.   she is a candidate for weight loss. I discussed with her the fact that loss of 5 - 10% of her  current body weight will have the most impact on her diabetes management.  Exercise, and detailed carbohydrates information provided  -  detailed on discharge instructions.  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  She is advised to continue Levothyroxine 125 mcg po daily before breakfast.   - The correct intake of thyroid hormone (Levothyroxine, Synthroid), is on empty stomach first thing in the morning, with water, separated  by at least 30 minutes from breakfast and other medications,  and separated by more than 4 hours from calcium, iron, multivitamins, acid reflux medications (PPIs).  - This medication is a life-long medication and will be needed to correct thyroid hormone imbalances for the rest of your life.  The dose may change from time to time, based on thyroid blood work.  - It is extremely important to be consistent taking this medication, near the same time each morning.  -AVOID TAKING PRODUCTS CONTAINING BIOTIN (commonly found in Hair, Skin, Nails vitamins) AS IT INTERFERES WITH THE VALIDITY OF THYROID FUNCTION  BLOOD TESTS.  6) Chronic Care/Health Maintenance: -she is on ACEI/ARB and Statin medications and is encouraged to initiate and continue to follow up with Ophthalmology, Dentist, Podiatrist at least yearly or according to recommendations, and advised to stay away from smoking. I have recommended yearly flu vaccine and pneumonia vaccine at least every 5 years; moderate intensity exercise for up to 150 minutes weekly; and sleep for at least 7 hours a day.  7) Chronic fatigue Will check TFTs now, as well as B12.  Iron panel was recently checked and was normal.  - she is advised to maintain close follow up with Donetta Potts, MD for primary care needs, as well as her other providers for optimal and coordinated care.     I spent  36  minutes in the care of the patient today including review of labs from CMP, Lipids, Thyroid Function, Hematology (current and previous including abstractions from other facilities); face-to-face time discussing  her blood glucose readings/logs, discussing hypoglycemia and hyperglycemia episodes and symptoms, medications doses, her options of short and long term treatment based on the latest standards of care / guidelines;  discussion about incorporating lifestyle medicine;  and documenting the encounter. Risk reduction counseling performed per USPSTF guidelines to reduce obesity and cardiovascular risk factors.     Please refer to Patient Instructions for Blood Glucose Monitoring and Insulin/Medications Dosing Guide"  in media tab for additional information. Please  also refer to " Patient Self Inventory" in the Media  tab for reviewed elements of pertinent patient history.  Doralyn [employer] participated in the discussions, expressed understanding, and voiced agreement with the above plans.  All questions were answered to her satisfaction. she is encouraged to contact clinic should she have any questions or concerns prior to her return visit.     Follow up plan: -  Return in about 4 months (around 05/02/2024) for Diabetes F/U with A1c in office, Thyroid follow up, Previsit labs, Bring meter and logs.  Ronny Bacon, [facility] [address] [place], [place] [address] Phone: [phone] Fax: [phone]  01/01/2024, 8:24 AM

## 2024-01-02 DIAGNOSIS — E08649 Diabetes mellitus due to underlying condition with hypoglycemia without coma: Secondary | ICD-10-CM | POA: Diagnosis not present

## 2024-01-14 ENCOUNTER — Ambulatory Visit: Payer: PPO | Attending: Nurse Practitioner

## 2024-01-14 DIAGNOSIS — Z952 Presence of prosthetic heart valve: Secondary | ICD-10-CM

## 2024-01-15 LAB — ECHOCARDIOGRAM COMPLETE
AR max vel: 1.34 cm2
AV Area VTI: 1.6 cm2
AV Area mean vel: 1.55 cm2
AV Mean grad: 10 mmHg
AV Peak grad: 18.6 mmHg
Ao pk vel: 2.16 m/s
Area-P 1/2: 2.76 cm2
Calc EF: 66.8 %
S' Lateral: 2.6 cm
Single Plane A2C EF: 65.3 %
Single Plane A4C EF: 69.5 %

## 2024-01-17 ENCOUNTER — Telehealth: Payer: Self-pay | Admitting: Nurse Practitioner

## 2024-01-17 NOTE — Telephone Encounter (Signed)
-----   Message from Sharlene Dory sent at 01/09/2024  6:06 PM EDT ----- Please let patient know that she is okay to take her OTC Osteo Biflex. Recommend updating her med list to reflect this change.   Thanks!   Best,  Sharlene Dory, NP ----- Message ----- From: Cheree Ditto, [facility] Sent: 01/09/2024   4:07 PM EDT To: Sharlene Dory, NP  I dont remember if I responded to this. Yes, she can ----- Message ----- From: Sharlene Dory, NP Sent: 12/23/2023   8:48 PM EDT To: Cheree Ditto, RPH  Hello friend,   Patient wants to know if she can take Osteo Biflex. Wanted to see if there were any drug interactions.   Thanks!   Best, Sharlene Dory, NP

## 2024-01-17 NOTE — Telephone Encounter (Signed)
 Informed and updated

## 2024-02-02 DIAGNOSIS — E08649 Diabetes mellitus due to underlying condition with hypoglycemia without coma: Secondary | ICD-10-CM | POA: Diagnosis not present

## 2024-03-03 DIAGNOSIS — E08649 Diabetes mellitus due to underlying condition with hypoglycemia without coma: Secondary | ICD-10-CM | POA: Diagnosis not present

## 2024-04-03 DIAGNOSIS — E08649 Diabetes mellitus due to underlying condition with hypoglycemia without coma: Secondary | ICD-10-CM | POA: Diagnosis not present

## 2024-04-05 DIAGNOSIS — R110 Nausea: Secondary | ICD-10-CM | POA: Diagnosis not present

## 2024-04-05 DIAGNOSIS — B349 Viral infection, unspecified: Secondary | ICD-10-CM | POA: Diagnosis not present

## 2024-04-05 DIAGNOSIS — Z20822 Contact with and (suspected) exposure to covid-19: Secondary | ICD-10-CM | POA: Diagnosis not present

## 2024-04-05 DIAGNOSIS — B001 Herpesviral vesicular dermatitis: Secondary | ICD-10-CM | POA: Diagnosis not present

## 2024-05-01 ENCOUNTER — Other Ambulatory Visit: Payer: Self-pay | Admitting: *Deleted

## 2024-05-01 DIAGNOSIS — Z7985 Long-term (current) use of injectable non-insulin antidiabetic drugs: Secondary | ICD-10-CM | POA: Diagnosis not present

## 2024-05-01 DIAGNOSIS — E782 Mixed hyperlipidemia: Secondary | ICD-10-CM | POA: Diagnosis not present

## 2024-05-01 DIAGNOSIS — I10 Essential (primary) hypertension: Secondary | ICD-10-CM | POA: Diagnosis not present

## 2024-05-01 DIAGNOSIS — R5382 Chronic fatigue, unspecified: Secondary | ICD-10-CM

## 2024-05-01 DIAGNOSIS — E119 Type 2 diabetes mellitus without complications: Secondary | ICD-10-CM | POA: Diagnosis not present

## 2024-05-01 DIAGNOSIS — Z794 Long term (current) use of insulin: Secondary | ICD-10-CM

## 2024-05-01 DIAGNOSIS — E039 Hypothyroidism, unspecified: Secondary | ICD-10-CM

## 2024-05-01 DIAGNOSIS — Z7984 Long term (current) use of oral hypoglycemic drugs: Secondary | ICD-10-CM

## 2024-05-02 LAB — COMPREHENSIVE METABOLIC PANEL WITH GFR
AG Ratio: 1.7 (calc) (ref 1.0–2.5)
ALT: 12 U/L (ref 6–29)
AST: 14 U/L (ref 10–35)
Albumin: 4.4 g/dL (ref 3.6–5.1)
Alkaline phosphatase (APISO): 82 U/L (ref 37–153)
BUN: 20 mg/dL (ref 7–25)
CO2: 28 mmol/L (ref 20–32)
Calcium: 9.6 mg/dL (ref 8.6–10.4)
Chloride: 101 mmol/L (ref 98–110)
Creat: 0.83 mg/dL (ref 0.50–1.05)
Globulin: 2.6 g/dL (ref 1.9–3.7)
Glucose, Bld: 177 mg/dL — ABNORMAL HIGH (ref 65–99)
Potassium: 4.7 mmol/L (ref 3.5–5.3)
Sodium: 137 mmol/L (ref 135–146)
Total Bilirubin: 1.1 mg/dL (ref 0.2–1.2)
Total Protein: 7 g/dL (ref 6.1–8.1)
eGFR: 78 mL/min/1.73m2 (ref >=60)

## 2024-05-02 LAB — VITAMIN B12: Vitamin B-12: 841 pg/mL (ref 200–1100)

## 2024-05-02 LAB — TSH: TSH: 1.81 m[IU]/L (ref 0.40–4.50)

## 2024-05-02 LAB — LIPID PANEL
Cholesterol: 162 mg/dL (ref <200)
HDL: 47 mg/dL — ABNORMAL LOW (ref >=50)
LDL Cholesterol (Calc): 92 mg/dL
Non-HDL Cholesterol (Calc): 115 mg/dL (ref <130)
Total CHOL/HDL Ratio: 3.4 (calc) (ref <5.0)
Triglycerides: 125 mg/dL (ref <150)

## 2024-05-02 LAB — T4, FREE: Free T4: 1.2 ng/dL (ref 0.8–1.8)

## 2024-05-02 LAB — VITAMIN D 25 HYDROXY (VIT D DEFICIENCY, FRACTURES): Vit D, 25-Hydroxy: 36 ng/mL (ref 30–100)

## 2024-05-03 DIAGNOSIS — E08649 Diabetes mellitus due to underlying condition with hypoglycemia without coma: Secondary | ICD-10-CM | POA: Diagnosis not present

## 2024-05-05 ENCOUNTER — Ambulatory Visit (INDEPENDENT_AMBULATORY_CARE_PROVIDER_SITE_OTHER): Admitting: Nurse Practitioner

## 2024-05-05 ENCOUNTER — Encounter: Payer: Self-pay | Admitting: Nurse Practitioner

## 2024-05-05 VITALS — BP 108/64 | HR 78 | Ht 65.0 in | Wt 197.4 lb

## 2024-05-05 DIAGNOSIS — E039 Hypothyroidism, unspecified: Secondary | ICD-10-CM

## 2024-05-05 DIAGNOSIS — E782 Mixed hyperlipidemia: Secondary | ICD-10-CM | POA: Diagnosis not present

## 2024-05-05 DIAGNOSIS — I10 Essential (primary) hypertension: Secondary | ICD-10-CM

## 2024-05-05 DIAGNOSIS — Z7985 Long-term (current) use of injectable non-insulin antidiabetic drugs: Secondary | ICD-10-CM | POA: Diagnosis not present

## 2024-05-05 DIAGNOSIS — Z7984 Long term (current) use of oral hypoglycemic drugs: Secondary | ICD-10-CM | POA: Diagnosis not present

## 2024-05-05 DIAGNOSIS — Z794 Long term (current) use of insulin: Secondary | ICD-10-CM

## 2024-05-05 DIAGNOSIS — E119 Type 2 diabetes mellitus without complications: Secondary | ICD-10-CM | POA: Diagnosis not present

## 2024-05-05 LAB — POCT GLYCOSYLATED HEMOGLOBIN (HGB A1C): Hemoglobin A1C: 8.9 % — AB (ref 4.0–5.6)

## 2024-05-05 MED ORDER — LEVOTHYROXINE SODIUM 125 MCG PO TABS: 125.0000 ug | ORAL_TABLET | Freq: Every day | ORAL | 3 refills | Status: AC

## 2024-05-05 MED ORDER — SEMAGLUTIDE (2 MG/DOSE) 8 MG/3ML ~~LOC~~ SOPN: 2.0000 mg | PEN_INJECTOR | SUBCUTANEOUS | 3 refills | Status: AC

## 2024-05-05 MED ORDER — HYDROCHLOROTHIAZIDE 12.5 MG PO TABS: 12.5000 mg | ORAL_TABLET | Freq: Every day | ORAL | 3 refills | Status: AC

## 2024-05-05 MED ORDER — INSULIN LISPRO (1 UNIT DIAL) 100 UNIT/ML (KWIKPEN): 10.0000 [IU] | PEN_INJECTOR | Freq: Three times a day (TID) | SUBCUTANEOUS | 3 refills | Status: AC

## 2024-05-05 MED ORDER — INSULIN GLARGINE 100 UNIT/ML SOLOSTAR PEN: 80.0000 [IU] | PEN_INJECTOR | Freq: Every day | SUBCUTANEOUS | 3 refills | Status: AC

## 2024-05-05 NOTE — Progress Notes (Signed)
 Endocrinology Follow Up Note       05/05/2024, 8:36 AM   Subjective:    Patient ID: Sarah Pope, female    DOB: 12-27-1955.  Sarah Pope is being seen in follow up after being seen in consultation for management of currently uncontrolled symptomatic diabetes requested by  Trudy Vaughn FALCON, MD.   Past Medical History:  Diagnosis Date   Acute respiratory failure with hypercapnia (HCC)    Allergy    Anxiety    Aortic stenosis    a. S/p AVR on 02/06/2022 using a 21 mm Edwards Inspiris Resilina pericardial valve   Asthma    Bronchitis with bronchospasm    Cancer (HCC)    skin   Depression    DM type 2 (diabetes mellitus, type 2) (HCC)    FATTY LIVER DISEASE 08/05/2008   GERD (gastroesophageal reflux disease)    Hyperlipidemia    Hypertension    Hypothyroidism    Kidney stones    Leukocytosis    Obesity, Class III, BMI 40-49.9 (morbid obesity)    PERSONAL HX COLONIC POLYPS 09/01/2008   PONV (postoperative nausea and vomiting)    Seasonal allergies    TRANSAMINASES, SERUM, ELEVATED 08/05/2008    Past Surgical History:  Procedure Laterality Date   AORTIC VALVE REPLACEMENT N/A 02/06/2022   Procedure: AORTIC VALVE REPLACEMENT (AVR) WITH 21 MM INSIPIRIS AORTIC VALVE.;  Surgeon: Lucas Dorise POUR, MD;  Location: [facility] OR;  Service: Open Heart Surgery;  Laterality: N/A;   CATARACT EXTRACTION W/PHACO Right 02/23/2023   Procedure: CATARACT EXTRACTION PHACO AND INTRAOCULAR LENS PLACEMENT (IOC);  Surgeon: Harrie Agent, MD;  Location: [facility];  Service: Ophthalmology;  Laterality: Right;  CDE: 4.99   CATARACT EXTRACTION W/PHACO Left 03/09/2023   Procedure: CATARACT EXTRACTION PHACO AND INTRAOCULAR LENS PLACEMENT (IOC);  Surgeon: Harrie Agent, MD;  Location: [facility];  Service: Ophthalmology;  Laterality: Left;  CDE:6.12   COLONOSCOPY  2013   Repeat 10 years   MANDIBLE SURGERY  1988   realignment   NOSE SURGERY   2007   sinus infections   POLYPECTOMY     RIGHT HEART CATH AND CORONARY ANGIOGRAPHY N/A 12/01/2021   Procedure: RIGHT HEART CATH AND CORONARY ANGIOGRAPHY;  Surgeon: Verlin Lonni BIRCH, MD;  Location: [facility];  Service: Cardiovascular;  Laterality: N/A;   skin cancer removal     TEE WITHOUT CARDIOVERSION N/A 02/06/2022   Procedure: TRANSESOPHAGEAL ECHOCARDIOGRAM (TEE);  Surgeon: Lucas Dorise POUR, MD;  Location: [facility];  Service: Open Heart Surgery;  Laterality: N/A;   TONSILLECTOMY      Social History   Socioeconomic History   Marital status: Divorced    Spouse name: Not on file   Number of children: Not on file   Years of education: Not on file   Highest education level: Not on file  Occupational History   Occupation: Lead Clerk at [employer]  Tobacco Use   Smoking status: Former    Current packs/day: 0.00    Average packs/day: 0.5 packs/day for 5.0 years (2.5 ttl pk-yrs)    Types: Cigarettes    Start date: 10/23/1977    Quit date: 10/23/1982    Years  since quitting: 41.5   Smokeless tobacco: Never  Vaping Use   Vaping status: Never Used  Substance and Sexual Activity   Alcohol use: No   Drug use: No   Sexual activity: Not on file  Other Topics Concern   Not on file  Social History Narrative   Not on file   Social Drivers of Health   Financial Resource Strain: Not on file  Food Insecurity: Not on file  Transportation Needs: Not on file  Physical Activity: Not on file  Stress: Not on file  Social Connections: Not on file    Family History  Problem Relation Age of Onset   Cancer Mother    Hypertension Mother    Thyroid  disease Mother    Breast cancer Mother    Diabetes Mother    Prostate cancer Father    Heart attack Maternal Grandmother    Allergies Daughter    Colon cancer Neg Hx    Esophageal cancer Neg Hx    Rectal cancer Neg Hx    Stomach cancer Neg Hx    Colon polyps Neg Hx     Outpatient Encounter Medications as of 05/05/2024   Medication Sig   albuterol  (VENTOLIN  HFA) 108 (90 Base) MCG/ACT inhaler Inhale 2 puffs into the lungs every 6 (six) hours as needed for shortness of breath.   aspirin  EC 81 MG tablet Take 81 mg by mouth daily. Swallow whole.   atorvastatin  (LIPITOR) 20 MG tablet Take 1 tablet (20 mg total) by mouth daily.   citalopram  (CELEXA ) 40 MG tablet Take 1 tablet (40 mg total) by mouth daily.   doxycycline  (VIBRAMYCIN ) 100 MG capsule Take 100 mg by mouth 2 (two) times daily.   metFORMIN  (GLUCOPHAGE -XR) 500 MG 24 hr tablet Take 1 tablet (500 mg total) by mouth daily with breakfast.   metoprolol  tartrate (LOPRESSOR ) 25 MG tablet TAKE ONE TABLET BY MOUTH TWICE DAILY   NON FORMULARY Take 1 tablet by mouth daily. Osteo Biflex (Patient reported)   omeprazole  (PRILOSEC) 40 MG capsule Take 40 mg by mouth daily.   ondansetron  (ZOFRAN -ODT) 4 MG disintegrating tablet Take 4 mg by mouth daily as needed for vomiting or nausea.   [DISCONTINUED] Continuous Blood Gluc Receiver (FREESTYLE LIBRE 14 DAY READER) DEVI USE AS DIRECTED THREE TIMES DAILY.   [DISCONTINUED] Continuous Blood Gluc Sensor (FREESTYLE LIBRE 14 DAY SENSOR) MISC USE TO CHECK BLOOD SUGAR THREE TIMES DAILY WITH SLIDING SCALE INSULIN  USE   [DISCONTINUED] hydrochlorothiazide  (HYDRODIURIL ) 12.5 MG tablet Take 12.5 mg by mouth daily.   [DISCONTINUED] insulin  glargine (LANTUS ) 100 UNIT/ML Solostar Pen Inject 80 Units into the skin at bedtime.   [DISCONTINUED] insulin  lispro (HUMALOG  KWIKPEN) 100 UNIT/ML KwikPen Inject 10-16 Units into the skin 3 (three) times daily.   [DISCONTINUED] levothyroxine  (SYNTHROID ) 125 MCG tablet TAKE 1 TABLET(125 MCG) BY MOUTH DAILY BEFORE BREAKFAST   [DISCONTINUED] Semaglutide , 2 MG/DOSE, 8 MG/3ML SOPN Inject 2 mg as directed once a week.   Fluticasone -Salmeterol (ADVAIR  DISKUS) 250-50 MCG/DOSE AEPB Inhale 1 puff into the lungs 2 (two) times daily. (Patient taking differently: Inhale 1 puff into the lungs 2 (two) times daily as  needed (respiratory issues.).)   hydrochlorothiazide  (HYDRODIURIL ) 12.5 MG tablet Take 1 tablet (12.5 mg total) by mouth daily.   insulin  glargine (LANTUS ) 100 UNIT/ML Solostar Pen Inject 80 Units into the skin at bedtime.   insulin  lispro (HUMALOG  KWIKPEN) 100 UNIT/ML KwikPen Inject 10-16 Units into the skin 3 (three) times daily.   levothyroxine  (SYNTHROID ) 125 MCG  tablet Take 1 tablet (125 mcg total) by mouth daily before breakfast.   potassium chloride  SA (KLOR-CON  M) 20 MEQ tablet Take 1 tablet (20 mEq total) by mouth daily. (Patient not taking: Reported on 05/05/2024)   Semaglutide , 2 MG/DOSE, 8 MG/3ML SOPN Inject 2 mg as directed once a week.   No facility-administered encounter medications on file as of 05/05/2024.    ALLERGIES: Allergies  Allergen Reactions   Codeine Nausea Only   Sulfa Antibiotics Itching    VACCINATION STATUS: Immunization History  Administered Date(s) Administered   Influenza Split 07/03/2012, 09/10/2013   Influenza,inj,Quad PF,6+ Mos 09/29/2015, 06/27/2016, 08/15/2017, 08/28/2018, 07/14/2019, 08/09/2020   Influenza-Unspecified 08/24/2011, 08/11/2020   Moderna Sars-Covid-2 Vaccination 04/05/2020, 04/28/2020   PNEUMOCOCCAL CONJUGATE-20 02/11/2022   Pneumococcal Polysaccharide-23 08/23/2004, 08/28/2018   Td 06/17/2008   Tdap 05/09/2019   Zoster Recombinant(Shingrix) 10/14/2019, 08/04/2020, 08/11/2020    Diabetes She presents for her follow-up diabetic visit. She has type 2 diabetes mellitus. Onset time: Diagnosed at approx age of 89. Her disease course has been fluctuating. There are no hypoglycemic associated symptoms. Associated symptoms include fatigue, foot paresthesias and weight loss. Pertinent negatives for diabetes include no polydipsia and no polyuria. There are no hypoglycemic complications. Symptoms are stable. Diabetic complications include nephropathy and peripheral neuropathy. Risk factors for coronary artery disease include stress, diabetes  mellitus, dyslipidemia, family history, obesity, hypertension, post-menopausal and sedentary lifestyle. Current diabetic treatment includes intensive insulin  program and oral agent (monotherapy) (and Ozempic ). She is compliant with treatment most of the time. Her weight is decreasing steadily. She is following a generally unhealthy diet. When asked about meal planning, she reported none. She has not had a previous visit with a dietitian. She rarely participates in exercise. Her home blood glucose trend is fluctuating minimally. Her overall blood glucose range is >200 mg/dl. (She presents today with her CGM showing limited data and fluctuating glycemic profile overall.  Her POCT A1c today is 8.9%, increasing from last visit of 8.6%.  Analysis of her CGM shows TIR 31%, TAR 69%, TBR 0% with active time of 14%.  She admits she has been cheating on her diet recently.  She also notes her CGM is not holding charge as it should.) An ACE inhibitor/angiotensin II receptor blocker is being taken. She sees a podiatrist.Eye exam is not current.  Hyperlipidemia This is a chronic problem. The current episode started more than 1 year ago. The problem is uncontrolled. Recent lipid tests were reviewed and are variable. Exacerbating diseases include chronic renal disease, diabetes, hypothyroidism and obesity. Factors aggravating her hyperlipidemia include thiazides and fatty foods. Current antihyperlipidemic treatment includes statins. The current treatment provides moderate improvement of lipids. Compliance problems include adherence to diet and adherence to exercise.  Risk factors for coronary artery disease include diabetes mellitus, dyslipidemia, family history, hypertension, obesity, post-menopausal, a sedentary lifestyle and stress.  Hypertension This is a chronic problem. The current episode started more than 1 year ago. The problem has been resolved since onset. The problem is controlled. Agents associated with  hypertension include thyroid  hormones. Risk factors for coronary artery disease include diabetes mellitus, dyslipidemia, family history, post-menopausal state, sedentary lifestyle, stress and obesity. Past treatments include angiotensin blockers and diuretics. The current treatment provides significant improvement. Compliance problems include exercise and diet.  Hypertensive end-organ damage includes kidney disease. Identifiable causes of hypertension include chronic renal disease and sleep apnea.     Review of systems  Constitutional: + steadily decreasing body weight, current Body mass index is 32.85 kg/m., +  fatigue-continued, no subjective hyperthermia, no subjective hypothermia Eyes: no blurry vision, no xerophthalmia ENT: no sore throat, no nodules palpated in throat, no dysphagia/odynophagia, no hoarseness Cardiovascular: no chest pain, no shortness of breath, no palpitations, + intermittent leg swelling Respiratory: no cough, no shortness of breath Gastrointestinal: no nausea/vomiting/diarrhea Musculoskeletal: no muscle/joint aches Skin: no rashes, no hyperemia Neurological: + resting tremors, + numbness/tingling/burning pain to bilateral feet, no dizziness Psychiatric: no depression, + anxiety (she asked me for a nerve pill today)  Objective:     BP 108/64 (BP Location: Left Arm, Patient Position: Sitting, Cuff Size: Large)   Pulse 78   Ht 5' 5 (1.651 m)   Wt 197 lb 6.4 oz (89.5 kg)   BMI 32.85 kg/m   Wt Readings from Last 3 Encounters:  05/05/24 197 lb 6.4 oz (89.5 kg)  01/01/24 199 lb 9.6 oz (90.5 kg)  12/20/23 201 lb 12.8 oz (91.5 kg)     BP Readings from Last 3 Encounters:  05/05/24 108/64  01/01/24 110/70  12/20/23 130/70     Physical Exam- Limited  Constitutional:  Body mass index is 32.85 kg/m. , not in acute distress, normal state of mind Eyes:  EOMI, no exophthalmos Musculoskeletal: no gross deformities, strength intact in all four extremities, no gross  restriction of joint movements Skin:  no rashes, no hyperemia Neurological: mild resting tremor     Diabetic Foot Exam - Simple   Simple Foot Form Visual Inspection See comments: Yes Sensation Testing Intact to touch and monofilament testing bilaterally: Yes Pulse Check Posterior Tibialis and Dorsalis pulse intact bilaterally: Yes Comments Onychomycosis bilaterally; varicose veins bilaterally     CMP ( most recent) CMP     Component Value Date/Time   NA 137 05/01/2024 1358   NA 140 08/15/2023 1155   K 4.7 05/01/2024 1358   CL 101 05/01/2024 1358   CO2 28 05/01/2024 1358   GLUCOSE 177 (H) 05/01/2024 1358   BUN 20 05/01/2024 1358   BUN 15 08/15/2023 1155   CREATININE 0.83 05/01/2024 1358   CALCIUM  9.6 05/01/2024 1358   PROT 7.0 05/01/2024 1358   PROT 6.5 08/15/2023 1155   ALBUMIN  4.1 08/15/2023 1155   AST 14 05/01/2024 1358   ALT 12 05/01/2024 1358   ALKPHOS 84 08/15/2023 1155   BILITOT 1.1 05/01/2024 1358   BILITOT 0.9 08/15/2023 1155   GFRNONAA 57 (L) 07/24/2023 1713   GFRAA >60 05/31/2020 0930     Diabetic Labs (most recent): Lab Results  Component Value Date   HGBA1C 8.9 (A) 05/05/2024   HGBA1C 8.6 (A) 01/01/2024   HGBA1C 8.6 (A) 08/28/2023   MICROALBUR 150 08/28/2023   MICROALBUR 1.20 02/05/2013     Lipid Panel ( most recent) Lipid Panel     Component Value Date/Time   CHOL 162 05/01/2024 1358   CHOL 165 04/18/2021 0943   TRIG 125 05/01/2024 1358   HDL 47 (L) 05/01/2024 1358   HDL 45 04/18/2021 0943   CHOLHDL 3.4 05/01/2024 1358   VLDL 22 12/16/2019 0905   LDLCALC 92 05/01/2024 1358   LABVLDL 28 04/18/2021 0943      Lab Results  Component Value Date   TSH 1.81 05/01/2024   TSH 1.360 08/15/2023   TSH 4.50 05/09/2023   TSH 5.150 (H) 04/18/2021   TSH 1.503 12/16/2019   TSH 1.360 07/01/2019   TSH 2.480 04/01/2018   TSH 2.650 10/03/2016   TSH 3.620 10/06/2015   TSH 4.248 02/05/2013   FREET4 1.2 05/01/2024  FREET4 1.17 08/15/2023           Assessment & Plan:   1) Type 2 diabetes mellitus without complication, with long-term current use of insulin  (HCC)  She presents today with her CGM showing limited data and fluctuating glycemic profile overall.  Her POCT A1c today is 8.9%, increasing from last visit of 8.6%.  Analysis of her CGM shows TIR 31%, TAR 69%, TBR 0% with active time of 14%.  She admits she has been cheating on her diet recently.  She also notes her CGM is not holding charge as it should.  - Sarah Pope has currently uncontrolled symptomatic type 2 DM since 68 years of age.  -Recent labs reviewed.  - I had a long discussion with her about the progressive nature of diabetes and the pathology behind its complications. -her diabetes is complicated by mild CKD, neuropathy and she remains at a high risk for more acute and chronic complications which include CAD, CVA, CKD, retinopathy, and neuropathy. These are all discussed in detail with her.  - Nutritional counseling repeated at each appointment due to patients tendency to fall back in to old habits.  - The patient admits there is a room for improvement in their diet and drink choices. -  Suggestion is made for the patient to avoid simple carbohydrates from their diet including Cakes, Sweet Desserts / Pastries, Ice Cream, Soda (diet and regular), Sweet Tea, Candies, Chips, Cookies, Sweet Pastries, Store Bought Juices, Alcohol in Excess of 1-2 drinks a day, Artificial Sweeteners, Coffee Creamer, and Sugar-free Products. This will help patient to have stable blood glucose profile and potentially avoid unintended weight gain.   - I encouraged the patient to switch to unprocessed or minimally processed complex starch and increased protein intake (animal or plant source), fruits, and vegetables.   - Patient is advised to stick to a routine mealtimes to eat 3 meals a day and avoid unnecessary snacks (to snack only to correct hypoglycemia).  - I have approached  her with the following individualized plan to manage her diabetes and patient agrees:   -Due to lack of consistent readings, no changes will be made to her medications today for safety purposes.  She is advised to continue Lantus  80 units SQ nightly, continue Humalog  10-16 units TID with meals if glucose is above 90 and she is eating (Specific instructions on how to titrate insulin  dosage based on glucose readings given to patient in writing), Ozempic  2 mg SQ weekly, and Metformin  500 mg ER daily (has tolerated this much better).  -she is encouraged to start consistently using her CGM to monitor glucose 4 times daily, before meals and before bed, and to call the clinic if she has readings less than 70 or above 300 for 3 tests in a row.  I advised her to reach out to [employer] with her concerns of the receiver not holding charge or switch to use on her phone.  - she is warned not to take insulin  without proper monitoring per orders. - Adjustment parameters are given to her for hypo and hyperglycemia in writing.  -She has allergy to sulfa meds.  - Specific targets for  A1c; LDL, HDL, and Triglycerides were discussed with the patient.  2) Blood Pressure /Hypertension:  her blood pressure is controlled to target.   she is advised to continue her current medications as prescribed by PCP/cardiology.  I did refill her hydrochlorothiazide  today at her request.   3) Lipids/Hyperlipidemia:    Review  of her recent lipid panel from 05/01/24 showed controlled LDL at 92.  she is advised to continue Lipitor 20 mg daily at bedtime.  Side effects and precautions discussed with her.   4)  Weight/Diet:  her Body mass index is 32.85 kg/m.  -  clearly complicating her diabetes care.   she is a candidate for weight loss. I discussed with her the fact that loss of 5 - 10% of her  current body weight will have the most impact on her diabetes management.  Exercise, and detailed carbohydrates information provided  -   detailed on discharge instructions.  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  She is advised to continue Levothyroxine  125 mcg po daily before breakfast.   - The correct intake of thyroid  hormone (Levothyroxine , Synthroid ), is on empty stomach first thing in the morning, with water , separated by at least 30 minutes from breakfast and other medications,  and separated by more than 4 hours from calcium , iron, multivitamins, acid reflux medications (PPIs).  - This medication is a life-long medication and will be needed to correct thyroid  hormone imbalances for the rest of your life.  The dose may change from time to time, based on thyroid  blood work.  - It is extremely important to be consistent taking this medication, near the same time each morning.  -AVOID TAKING PRODUCTS CONTAINING BIOTIN (commonly found in Hair, Skin, Nails vitamins) AS IT INTERFERES WITH THE VALIDITY OF THYROID  FUNCTION BLOOD TESTS.  6) Chronic Care/Health Maintenance: -she is on ACEI/ARB and Statin medications and is encouraged to initiate and continue to follow up with Ophthalmology, Dentist, Podiatrist at least yearly or according to recommendations, and advised to stay away from smoking. I have recommended yearly flu vaccine and pneumonia vaccine at least every 5 years; moderate intensity exercise for up to 150 minutes weekly; and sleep for at least 7 hours a day.  7) Chronic fatigue Will check TFTs now, as well as B12.  Iron panel was recently checked and was normal.  - she is advised to maintain close follow up with Trudy Vaughn FALCON, MD for primary care needs, as well as her other providers for optimal and coordinated care.  I encouraged her to speak with her PCP regarding her nerve pill as I will not be providing prescription for that.     I spent  46  minutes in the care of the patient today including review  of labs from CMP, Lipids, Thyroid  Function, Hematology (current and previous including abstractions from other facilities); face-to-face time discussing  her blood glucose readings/logs, discussing hypoglycemia and hyperglycemia episodes and symptoms, medications doses, her options of short and long term treatment based on the latest standards of care / guidelines;  discussion about incorporating lifestyle medicine;  and documenting the encounter. Risk reduction counseling performed per USPSTF guidelines to reduce obesity and cardiovascular risk factors.     Please refer to Patient Instructions for Blood Glucose Monitoring and Insulin /Medications Dosing Guide  in media tab for additional information. Please  also refer to  Patient Self Inventory in the Media  tab for reviewed elements of pertinent patient history.  [employer] participated in the discussions, expressed understanding, and voiced agreement with the above plans.  All questions were answered to her satisfaction. she is encouraged to contact clinic should she have any questions or concerns prior to her return visit.     Follow up plan: -  Return in about 4 months (around 09/05/2024) for Diabetes F/U with A1c in office, No previsit labs, Bring meter and logs.  Benton Rio, [facility] [address] [place], [place] [address] Phone: [phone] Fax: [phone]  05/05/2024, 8:36 AM

## 2024-05-21 ENCOUNTER — Telehealth: Payer: Self-pay | Admitting: Adult Health

## 2024-05-21 NOTE — Telephone Encounter (Signed)
 LVM for patient to call and discuss rescheduling the 05/29/24 8:30 am appointment with Madelin Stank, NP

## 2024-05-22 ENCOUNTER — Encounter: Payer: Self-pay | Admitting: Adult Health

## 2024-05-22 NOTE — Telephone Encounter (Signed)
 LVM for patient regarding the appointment time change on Thursday 05/29/24 with Tammy Parrett, NP---new appointment time is 3:00 pm on 05/29/24.  Will mail letter as well and requested return confirmation call

## 2024-05-28 DIAGNOSIS — E039 Hypothyroidism, unspecified: Secondary | ICD-10-CM | POA: Diagnosis not present

## 2024-05-28 DIAGNOSIS — D649 Anemia, unspecified: Secondary | ICD-10-CM | POA: Diagnosis not present

## 2024-05-28 DIAGNOSIS — E559 Vitamin D deficiency, unspecified: Secondary | ICD-10-CM | POA: Diagnosis not present

## 2024-05-28 DIAGNOSIS — J45909 Unspecified asthma, uncomplicated: Secondary | ICD-10-CM | POA: Diagnosis not present

## 2024-05-28 DIAGNOSIS — Z6832 Body mass index (BMI) 32.0-32.9, adult: Secondary | ICD-10-CM | POA: Diagnosis not present

## 2024-05-28 DIAGNOSIS — I2510 Atherosclerotic heart disease of native coronary artery without angina pectoris: Secondary | ICD-10-CM | POA: Diagnosis not present

## 2024-05-28 DIAGNOSIS — E119 Type 2 diabetes mellitus without complications: Secondary | ICD-10-CM | POA: Diagnosis not present

## 2024-05-28 DIAGNOSIS — E7849 Other hyperlipidemia: Secondary | ICD-10-CM | POA: Diagnosis not present

## 2024-05-28 DIAGNOSIS — Z952 Presence of prosthetic heart valve: Secondary | ICD-10-CM | POA: Diagnosis not present

## 2024-05-28 DIAGNOSIS — K219 Gastro-esophageal reflux disease without esophagitis: Secondary | ICD-10-CM | POA: Diagnosis not present

## 2024-05-29 ENCOUNTER — Ambulatory Visit: Admitting: Adult Health

## 2024-05-29 ENCOUNTER — Encounter: Payer: Self-pay | Admitting: Adult Health

## 2024-05-29 VITALS — BP 117/70 | HR 83 | Temp 100.1°F | Ht 65.0 in | Wt 195.6 lb

## 2024-05-29 DIAGNOSIS — G4733 Obstructive sleep apnea (adult) (pediatric): Secondary | ICD-10-CM

## 2024-05-29 DIAGNOSIS — R0683 Snoring: Secondary | ICD-10-CM

## 2024-05-29 NOTE — Progress Notes (Signed)
 @Patient  ID: Sarah Pope, female    DOB: 01-Mar-1956, 68 y.o.   MRN: [id number]  Chief Complaint  Patient presents with   Sleep Apnea    Split night in 2014   Consult    Referring provider: Trudy Vaughn FALCON, MD  HPI: 68 yo seen for sleep consult 05/29/24  Former patient of Dr. Brien last seen 2014  TEST/EVENTS :  11/29/12 Severe OSA AHI 45/hr  05/29/2024 Sleep consult  Discussed the use of AI scribe software for clinical note transcription with the patient, who gave verbal consent to proceed.  History of Present Illness Sarah Pope is a 68 year old female presents for sleep consult today for symptoms of restless sleep and daytime sleepiness. Previous diagnosis of severe OSA in past .   She has a history of severe sleep apnea diagnosed in 2014, with a sleep study indicating an apnea-hypopnea index of 45 events per hour. Initially treated with CPAP therapy, she used it for several years before discontinuing for reasons she cannot recall. Currently, she experiences significant daytime sleepiness, restless sleep, and occasionally takes naps lasting two to three hours. No snoring has been mentioned to her recently, and she does not use any sleeping pills. No history of CHF or CVA. No symptoms suspicious of cataplexy or sleep paralysis. Feels sleepy most days with fatigue.  Current weight is 195lbs with BMI at 32. Has dentures.      05/29/2024    2:00 PM  Results of the Epworth flowsheet  Sitting and reading 3  Watching TV 3  Sitting, inactive in a public place (e.g. a theatre or a meeting) 0  As a passenger in a car for an hour without a break 0  Lying down to rest in the afternoon when circumstances permit 3  Sitting and talking to someone 0  Sitting quietly after a lunch without alcohol 3  In a car, while stopped for a few minutes in traffic 0  Total score 12      Past Surgical History:  Procedure Laterality Date   AORTIC VALVE REPLACEMENT N/A 02/06/2022    Procedure: AORTIC VALVE REPLACEMENT (AVR) WITH 21 MM INSIPIRIS AORTIC VALVE.;  Surgeon: Lucas Dorise POUR, MD;  Location: [facility] OR;  Service: Open Heart Surgery;  Laterality: N/A;   CATARACT EXTRACTION W/PHACO Right 02/23/2023   Procedure: CATARACT EXTRACTION PHACO AND INTRAOCULAR LENS PLACEMENT (IOC);  Surgeon: Harrie Agent, MD;  Location: [facility];  Service: Ophthalmology;  Laterality: Right;  CDE: 4.99   CATARACT EXTRACTION W/PHACO Left 03/09/2023   Procedure: CATARACT EXTRACTION PHACO AND INTRAOCULAR LENS PLACEMENT (IOC);  Surgeon: Harrie Agent, MD;  Location: [facility];  Service: Ophthalmology;  Laterality: Left;  CDE:6.12   COLONOSCOPY  2013   Repeat 10 years   MANDIBLE SURGERY  1988   realignment   NOSE SURGERY  2007   sinus infections   POLYPECTOMY     RIGHT HEART CATH AND CORONARY ANGIOGRAPHY N/A 12/01/2021   Procedure: RIGHT HEART CATH AND CORONARY ANGIOGRAPHY;  Surgeon: Verlin Lonni BIRCH, MD;  Location: [facility];  Service: Cardiovascular;  Laterality: N/A;   skin cancer removal     TEE WITHOUT CARDIOVERSION N/A 02/06/2022   Procedure: TRANSESOPHAGEAL ECHOCARDIOGRAM (TEE);  Surgeon: Lucas Dorise POUR, MD;  Location: [facility] OR;  Service: Open Heart Surgery;  Laterality: N/A;   TONSILLECTOMY        In terms of social history, she is retired, lives with her significant other, and has adult  children. She quit smoking years ago and does not consume alcohol regularly.  Family History  Problem Relation Age of Onset   Cancer Mother    Hypertension Mother    Thyroid  disease Mother    Breast cancer Mother    Diabetes Mother    Prostate cancer Father    Heart attack Maternal Grandmother    Allergies Daughter    Colon cancer Neg Hx    Esophageal cancer Neg Hx    Rectal cancer Neg Hx    Stomach cancer Neg Hx    Colon polyps Neg Hx       Allergies  Allergen Reactions   Codeine Nausea Only   Sulfa Antibiotics Itching    Immunization History  Administered Date(s)  Administered   Influenza Split 07/03/2012, 09/10/2013   Influenza,inj,Quad PF,6+ Mos 09/29/2015, 06/27/2016, 08/15/2017, 08/28/2018, 07/14/2019, 08/09/2020   Influenza-Unspecified 08/24/2011, 08/11/2020   Moderna Sars-Covid-2 Vaccination 04/05/2020, 04/28/2020   PNEUMOCOCCAL CONJUGATE-20 02/11/2022   Pneumococcal Polysaccharide-23 08/23/2004, 08/28/2018   Td 06/17/2008   Tdap 05/09/2019   Zoster Recombinant(Shingrix) 10/14/2019, 08/04/2020, 08/11/2020    Past Medical History:  Diagnosis Date   Acute respiratory failure with hypercapnia (HCC)    Allergy    Anxiety    Aortic stenosis    a. S/p AVR on 02/06/2022 using a 21 mm Edwards Inspiris Resilina pericardial valve   Asthma    Bronchitis with bronchospasm    Cancer (HCC)    skin   Depression    DM type 2 (diabetes mellitus, type 2) (HCC)    FATTY LIVER DISEASE 08/05/2008   GERD (gastroesophageal reflux disease)    Hyperlipidemia    Hypertension    Hypothyroidism    Kidney stones    Leukocytosis    Obesity, Class III, BMI 40-49.9 (morbid obesity)    PERSONAL HX COLONIC POLYPS 09/01/2008   PONV (postoperative nausea and vomiting)    Seasonal allergies    TRANSAMINASES, SERUM, ELEVATED 08/05/2008    Tobacco History: Social History   Tobacco Use  Smoking Status Former   Current packs/day: 0.00   Average packs/day: 0.5 packs/day for 5.0 years (2.5 ttl pk-yrs)   Types: Cigarettes   Start date: 10/23/1977   Quit date: 10/23/1982   Years since quitting: 41.6  Smokeless Tobacco Never   Counseling given: Not Answered   Outpatient Medications Prior to Visit  Medication Sig Dispense Refill   albuterol  (VENTOLIN  HFA) 108 (90 Base) MCG/ACT inhaler Inhale 2 puffs into the lungs every 6 (six) hours as needed for shortness of breath.     atorvastatin  (LIPITOR) 20 MG tablet Take 1 tablet (20 mg total) by mouth daily. 90 tablet 1   atorvastatin  (LIPITOR) 40 MG tablet Take 40 mg by mouth daily.     citalopram  (CELEXA ) 40 MG tablet  Take 1 tablet (40 mg total) by mouth daily. 90 tablet 1   Fluticasone -Salmeterol (ADVAIR  DISKUS) 250-50 MCG/DOSE AEPB Inhale 1 puff into the lungs 2 (two) times daily. 1 each 3   hydrochlorothiazide  (HYDRODIURIL ) 12.5 MG tablet Take 1 tablet (12.5 mg total) by mouth daily. 90 tablet 3   hydrochlorothiazide  (HYDRODIURIL ) 12.5 MG tablet Take 12.5 mg by mouth daily.     insulin  glargine (LANTUS ) 100 UNIT/ML Solostar Pen Inject 80 Units into the skin at bedtime. 45 mL 3   insulin  lispro (HUMALOG  KWIKPEN) 100 UNIT/ML KwikPen Inject 10-16 Units into the skin 3 (three) times daily. 45 mL 3   levothyroxine  (SYNTHROID ) 125 MCG tablet Take 1 tablet (125 mcg  total) by mouth daily before breakfast. 90 tablet 3   levothyroxine  (SYNTHROID ) 150 MCG tablet Take 150 mcg by mouth daily before breakfast.     metoprolol  tartrate (LOPRESSOR ) 25 MG tablet TAKE ONE TABLET BY MOUTH TWICE DAILY 180 tablet 3   omeprazole  (PRILOSEC) 40 MG capsule Take 40 mg by mouth daily.     ondansetron  (ZOFRAN -ODT) 4 MG disintegrating tablet Take 4 mg by mouth daily as needed for vomiting or nausea.     Semaglutide , 2 MG/DOSE, 8 MG/3ML SOPN Inject 2 mg as directed once a week. 3 mL 3   aspirin  EC 81 MG tablet Take 81 mg by mouth daily. Swallow whole. (Patient not taking: Reported on 05/29/2024)     doxycycline  (VIBRAMYCIN ) 100 MG capsule Take 100 mg by mouth 2 (two) times daily. (Patient not taking: Reported on 05/29/2024)     metFORMIN  (GLUCOPHAGE -XR) 500 MG 24 hr tablet Take 1 tablet (500 mg total) by mouth daily with breakfast. (Patient not taking: Reported on 05/29/2024) 90 tablet 3   NON FORMULARY Take 1 tablet by mouth daily. Osteo Biflex (Patient reported)     potassium chloride  SA (KLOR-CON  M) 20 MEQ tablet Take 1 tablet (20 mEq total) by mouth daily. (Patient not taking: Reported on 05/29/2024) 90 tablet 3   No facility-administered medications prior to visit.     Review of Systems:   Constitutional:   No  weight loss, night  sweats,  Fevers, chills, +fatigue, or  lassitude.  HEENT:   No headaches,  Difficulty swallowing,  Tooth/dental problems, or  Sore throat,                No sneezing, itching, ear ache, nasal congestion, post nasal drip,   CV:  No chest pain,  Orthopnea, PND, swelling in lower extremities, anasarca, dizziness, palpitations, syncope.   GI  No heartburn, indigestion, abdominal pain, nausea, vomiting, diarrhea, change in bowel habits, loss of appetite, bloody stools.   Resp: No shortness of breath with exertion or at rest.  No excess mucus, no productive cough,  No non-productive cough,  No coughing up of blood.  No change in color of mucus.  No wheezing.  No chest wall deformity  Skin: no rash or lesions.  GU: no dysuria, change in color of urine, no urgency or frequency.  No flank pain, no hematuria   MS:  No joint pain or swelling.  No decreased range of motion.  No back pain.    Physical Exam  BP 117/70   Pulse 83   Temp 100.1 F (37.8 C)   Ht 5' 5 (1.651 m)   Wt 195 lb 9.6 oz (88.7 kg)   SpO2 97%   BMI 32.55 kg/m   GEN: A/Ox3; pleasant , NAD, well nourished    HEENT:  [place]/AT,  EACs-clear, TMs-wnl, NOSE-clear, THROAT-clear, no lesions, no postnasal drip or exudate noted. Class 3 MP airway   NECK:  Supple w/ fair ROM; no JVD; normal carotid impulses w/o bruits; no thyromegaly or nodules palpated; no lymphadenopathy.    RESP  Clear  P & A; w/o, wheezes/ rales/ or rhonchi. no accessory muscle use, no dullness to percussion  CARD:  RRR, no m/r/g, tr peripheral edema, pulses intact, no cyanosis or clubbing.  GI:   Soft & nt; nml bowel sounds; no organomegaly or masses detected.   Musco: Warm bil, no deformities or joint swelling noted.   Neuro: alert, no focal deficits noted.    Skin: Warm, no lesions or  rashes    Lab Results:  CBC    Component Value Date/Time   WBC 4.6 07/24/2023 1713   RBC 4.95 07/24/2023 1713   HGB 12.7 07/24/2023 1713   HGB 12.8 04/18/2021  0943   HCT 39.8 07/24/2023 1713   HCT 40.0 04/18/2021 0943   PLT 203 07/24/2023 1713   PLT 267 04/18/2021 0943   MCV 80.4 07/24/2023 1713   MCV 80 04/18/2021 0943   MCH 25.7 (L) 07/24/2023 1713   MCHC 31.9 07/24/2023 1713   RDW 13.1 07/24/2023 1713   RDW 13.3 04/18/2021 0943   LYMPHSABS 0.8 07/24/2023 1713   MONOABS 0.8 07/24/2023 1713   EOSABS 0.1 07/24/2023 1713   BASOSABS 0.0 07/24/2023 1713    BMET    Component Value Date/Time   NA 137 05/01/2024 1358   NA 140 08/15/2023 1155   K 4.7 05/01/2024 1358   CL 101 05/01/2024 1358   CO2 28 05/01/2024 1358   GLUCOSE 177 (H) 05/01/2024 1358   BUN 20 05/01/2024 1358   BUN 15 08/15/2023 1155   CREATININE 0.83 05/01/2024 1358   CALCIUM  9.6 05/01/2024 1358   GFRNONAA 57 (L) 07/24/2023 1713   GFRAA >60 05/31/2020 0930    BNP    Component Value Date/Time   BNP 77.0 10/25/2021 1242    ProBNP    Component Value Date/Time   PROBNP 32.8 10/20/2012 2024    Imaging: No results found.  Administration History     None           No data to display          No results found for: NITRICOXIDE      Assessment & Plan:   No problem-specific Assessment & Plan notes found for this encounter. Assessment and Plan Assessment & Plan Obstructive sleep apnea (suspected recurrence)   Suspected recurrence of obstructive sleep apnea. Previously diagnosed with severe sleep apnea (45 apneic episodes per hour) and treated with CPAP. Current symptoms include daytime sleepiness and restless sleep. No recent sleep study since 2014. Discussed the impact of untreated sleep apnea on cardiovascular health, including stress on the heart due to decreased oxygen levels during apneic episodes. Explained advancements in CPAP technology, including smaller, quieter machines and more flexible nasal masks. Order home sleep study to assess current status of sleep apnea. If sleep apnea is confirmed, consider restarting CPAP therapy. - discussed  how weight can impact sleep and risk for sleep disordered breathing - discussed options to assist with weight loss: combination of diet modification, cardiovascular and strength training exercises   - had an extensive discussion regarding the adverse health consequences related to untreated sleep disordered breathing - specifically discussed the risks for hypertension, coronary artery disease, cardiac dysrhythmias, cerebrovascular disease, and diabetes - lifestyle modification discussed   - discussed how sleep disruption can increase risk of accidents, particularly when driving - safe driving practices were discussed   -plan set up for home sleep study   Type 2 diabetes mellitus   Type 2 diabetes mellitus continue follow up with PCP.   Hypertension   Hypertension, continue follow up with PCP /cardiology.   Obesity with BMI 32- healthy weight loss        Madelin Stank, NP 05/29/2024

## 2024-05-29 NOTE — Patient Instructions (Signed)
 Set up for home sleep study  Healthy sleep regimen  Do not drive if sleepy  Work on healthy weight  Follow up in 6 weeks to discuss results with Dr. Catherine and As needed

## 2024-06-03 DIAGNOSIS — E08649 Diabetes mellitus due to underlying condition with hypoglycemia without coma: Secondary | ICD-10-CM | POA: Diagnosis not present

## 2024-06-05 DIAGNOSIS — M25562 Pain in left knee: Secondary | ICD-10-CM | POA: Diagnosis not present

## 2024-06-11 DIAGNOSIS — M65341 Trigger finger, right ring finger: Secondary | ICD-10-CM | POA: Diagnosis not present

## 2024-06-11 DIAGNOSIS — Z6832 Body mass index (BMI) 32.0-32.9, adult: Secondary | ICD-10-CM | POA: Diagnosis not present

## 2024-06-11 DIAGNOSIS — M25562 Pain in left knee: Secondary | ICD-10-CM | POA: Diagnosis not present

## 2024-06-16 ENCOUNTER — Telehealth: Payer: Self-pay | Admitting: Pharmacy Technician

## 2024-06-16 ENCOUNTER — Other Ambulatory Visit (HOSPITAL_COMMUNITY): Payer: Self-pay

## 2024-06-16 NOTE — Telephone Encounter (Signed)
 Pharmacy Patient Advocate Encounter   Received notification from CoverMyMeds that prior authorization for Ozempic  (2 MG/DOSE) 8MG /3ML pen-injectors  is required/requested.   Insurance verification completed.   The patient is insured through [facility] ADVANTAGE/RX ADVANCE .   Per test claim: PA required; PA submitted to above mentioned insurance via Latent Key/confirmation #/EOC [id number] Status is pending

## 2024-06-16 NOTE — Telephone Encounter (Signed)
 Pharmacy Patient Advocate Encounter  Received notification from [employer]/RX ADVANCE that Prior Authorization for Ozempic  (2 MG/DOSE) 8MG /3ML pen-injectors  has been APPROVED from 06/16/24 to 06/16/25. Ran test claim, Copay is $0.00. This test claim was processed through [facility]- copay amounts may vary at other pharmacies due to pharmacy/plan contracts, or as the patient moves through the different stages of their insurance plan.   PA #/Case ID/Reference #: [id number]

## 2024-06-25 ENCOUNTER — Telehealth: Payer: Self-pay | Admitting: Adult Health

## 2024-06-25 NOTE — Telephone Encounter (Signed)
 LVM for patient to call and discuss scheduling follow up appointment with Dr. Alghanim for HST results (per Madelin Stank, NP on 05/29/24)

## 2024-07-04 DIAGNOSIS — E08649 Diabetes mellitus due to underlying condition with hypoglycemia without coma: Secondary | ICD-10-CM | POA: Diagnosis not present

## 2024-07-15 ENCOUNTER — Ambulatory Visit

## 2024-07-15 DIAGNOSIS — R0683 Snoring: Secondary | ICD-10-CM

## 2024-07-15 DIAGNOSIS — G4733 Obstructive sleep apnea (adult) (pediatric): Secondary | ICD-10-CM

## 2024-07-16 DIAGNOSIS — L0390 Cellulitis, unspecified: Secondary | ICD-10-CM | POA: Diagnosis not present

## 2024-07-16 DIAGNOSIS — Z6831 Body mass index (BMI) 31.0-31.9, adult: Secondary | ICD-10-CM | POA: Diagnosis not present

## 2024-08-03 DIAGNOSIS — E08649 Diabetes mellitus due to underlying condition with hypoglycemia without coma: Secondary | ICD-10-CM | POA: Diagnosis not present

## 2024-08-08 ENCOUNTER — Telehealth: Payer: Self-pay | Admitting: Pulmonary Disease

## 2024-08-08 NOTE — Telephone Encounter (Signed)
 Please let patient know she has Moderate OSA, will discuss in detail at ov and decide on tx plan  Has ov with Dr. Catherine this month, make sure she keeps follow up

## 2024-08-08 NOTE — Telephone Encounter (Signed)
 Called and spoke with patient, provided results per Sarah Stank NP.  She verbalized understanding.  She has an appointment at the [facility] office on 10/29 at 3:30 pm, advised to arrive by 3:15 pm for check in.  Nothing further needed.

## 2024-08-08 NOTE — Telephone Encounter (Signed)
 Call patient  Sleep study result  Date of study: 07/15/2024  Impression: Moderate obstructive sleep apnea with moderate oxygen desaturations AHI of 17.5, O2 nadir of 80% oxygen was below 90% for 25 minutes  Recommendation: DME referral  Recommend CPAP therapy for moderate obstructive sleep apnea.  Auto-titrating CPAP with pressure settings of 5-15 should be appropriate, along with heated humidification using the patient's preferred mask.  Encourage weight loss measures.  Schedule follow-up in the office 4 to 6 weeks after initiation. of treatment

## 2024-08-11 ENCOUNTER — Ambulatory Visit: Payer: Self-pay | Admitting: Adult Health

## 2024-08-11 DIAGNOSIS — G4733 Obstructive sleep apnea (adult) (pediatric): Secondary | ICD-10-CM | POA: Diagnosis not present

## 2024-08-19 NOTE — Progress Notes (Deleted)
 Established Patient Pulmonology Office Visit   Subjective:  Patient ID: ICELYN NAVARRETE, female    DOB: 1956/04/17  MRN: [id number]  CC: No chief complaint on file.   HPI  Ms. Geffert is a 68 y/o F with a PMH significant for HLD, DM, and HTN who presents for follow up of OSA.  The patient underwent HST and was diagnosed with moderate OSA with moderate oxygen desaturation. AHI was 17.5 and oxygen nadir was 80%. Sleep doctor recommended auto-titrating CPAP 5 - 15 with humidification and patient's choice of face mask.  {PULM QUESTIONNAIRES (Optional):33196}  ROS  {History (Optional):23778}  Current Outpatient Medications:    albuterol  (VENTOLIN  HFA) 108 (90 Base) MCG/ACT inhaler, Inhale 2 puffs into the lungs every 6 (six) hours as needed for shortness of breath., Disp: , Rfl:    aspirin  EC 81 MG tablet, Take 81 mg by mouth daily. Swallow whole. (Patient not taking: Reported on 05/29/2024), Disp: , Rfl:    atorvastatin  (LIPITOR) 20 MG tablet, Take 1 tablet (20 mg total) by mouth daily., Disp: 90 tablet, Rfl: 1   atorvastatin  (LIPITOR) 40 MG tablet, Take 40 mg by mouth daily., Disp: , Rfl:    citalopram  (CELEXA ) 40 MG tablet, Take 1 tablet (40 mg total) by mouth daily., Disp: 90 tablet, Rfl: 1   doxycycline  (VIBRAMYCIN ) 100 MG capsule, Take 100 mg by mouth 2 (two) times daily. (Patient not taking: Reported on 05/29/2024), Disp: , Rfl:    Fluticasone -Salmeterol (ADVAIR  DISKUS) 250-50 MCG/DOSE AEPB, Inhale 1 puff into the lungs 2 (two) times daily., Disp: 1 each, Rfl: 3   hydrochlorothiazide  (HYDRODIURIL ) 12.5 MG tablet, Take 1 tablet (12.5 mg total) by mouth daily., Disp: 90 tablet, Rfl: 3   hydrochlorothiazide  (HYDRODIURIL ) 12.5 MG tablet, Take 12.5 mg by mouth daily., Disp: , Rfl:    insulin  glargine (LANTUS ) 100 UNIT/ML Solostar Pen, Inject 80 Units into the skin at bedtime., Disp: 45 mL, Rfl: 3   insulin  lispro (HUMALOG  KWIKPEN) 100 UNIT/ML KwikPen, Inject 10-16 Units into the skin 3  (three) times daily., Disp: 45 mL, Rfl: 3   levothyroxine  (SYNTHROID ) 125 MCG tablet, Take 1 tablet (125 mcg total) by mouth daily before breakfast., Disp: 90 tablet, Rfl: 3   levothyroxine  (SYNTHROID ) 150 MCG tablet, Take 150 mcg by mouth daily before breakfast., Disp: , Rfl:    metFORMIN  (GLUCOPHAGE -XR) 500 MG 24 hr tablet, Take 1 tablet (500 mg total) by mouth daily with breakfast. (Patient not taking: Reported on 05/29/2024), Disp: 90 tablet, Rfl: 3   metoprolol  tartrate (LOPRESSOR ) 25 MG tablet, TAKE ONE TABLET BY MOUTH TWICE DAILY, Disp: 180 tablet, Rfl: 3   NON FORMULARY, Take 1 tablet by mouth daily. Osteo Biflex (Patient reported), Disp: , Rfl:    omeprazole  (PRILOSEC) 40 MG capsule, Take 40 mg by mouth daily., Disp: , Rfl:    ondansetron  (ZOFRAN -ODT) 4 MG disintegrating tablet, Take 4 mg by mouth daily as needed for vomiting or nausea., Disp: , Rfl:    potassium chloride  SA (KLOR-CON  M) 20 MEQ tablet, Take 1 tablet (20 mEq total) by mouth daily. (Patient not taking: Reported on 05/29/2024), Disp: 90 tablet, Rfl: 3   Semaglutide , 2 MG/DOSE, 8 MG/3ML SOPN, Inject 2 mg as directed once a week., Disp: 3 mL, Rfl: 3      Objective:  There were no vitals taken for this visit. {Pulm Vitals (Optional):32837}  Physical Exam   Diagnostic Review:  {Labs (Optional):32838}     Assessment & Plan:   Assessment &  Plan OSA (obstructive sleep apnea)  Snoring   No orders of the defined types were placed in this encounter.     No follow-ups on file.   Deletha Jaffee, MD

## 2024-08-20 ENCOUNTER — Ambulatory Visit: Admitting: Pulmonary Disease

## 2024-08-20 DIAGNOSIS — R0683 Snoring: Secondary | ICD-10-CM

## 2024-08-20 DIAGNOSIS — G4733 Obstructive sleep apnea (adult) (pediatric): Secondary | ICD-10-CM

## 2024-08-20 NOTE — Progress Notes (Unsigned)
 Established Patient Pulmonology Office Visit   Subjective:  Patient ID: Sarah Pope, female    DOB: July 10, 1956  MRN: [id number]  CC: No chief complaint on file.   HPI  Sarah Pope is a 68 y/o F with a PMH significant for HTN, HLD, Morbid Obesity, and OSA who presents for follow up.  She underwent HST that showed moderate obstructive sleep apnea with moderate oxygen desaturations. AHI of 17.5, O2 nadir of 80% oxygen was below 90% for 25 minutes. Sleep medicine recommended Auto-titrating CPAP with pressure settings of 5-15 along with heated humidification using the patient's preferred mask as well as encourage weight loss measures.   {PULM QUESTIONNAIRES (Optional):33196}  ROS  {History (Optional):23778}  Current Outpatient Medications:    albuterol  (VENTOLIN  HFA) 108 (90 Base) MCG/ACT inhaler, Inhale 2 puffs into the lungs every 6 (six) hours as needed for shortness of breath., Disp: , Rfl:    aspirin  EC 81 MG tablet, Take 81 mg by mouth daily. Swallow whole. (Patient not taking: Reported on 05/29/2024), Disp: , Rfl:    atorvastatin  (LIPITOR) 20 MG tablet, Take 1 tablet (20 mg total) by mouth daily., Disp: 90 tablet, Rfl: 1   atorvastatin  (LIPITOR) 40 MG tablet, Take 40 mg by mouth daily., Disp: , Rfl:    citalopram  (CELEXA ) 40 MG tablet, Take 1 tablet (40 mg total) by mouth daily., Disp: 90 tablet, Rfl: 1   doxycycline  (VIBRAMYCIN ) 100 MG capsule, Take 100 mg by mouth 2 (two) times daily. (Patient not taking: Reported on 05/29/2024), Disp: , Rfl:    Fluticasone -Salmeterol (ADVAIR  DISKUS) 250-50 MCG/DOSE AEPB, Inhale 1 puff into the lungs 2 (two) times daily., Disp: 1 each, Rfl: 3   hydrochlorothiazide  (HYDRODIURIL ) 12.5 MG tablet, Take 1 tablet (12.5 mg total) by mouth daily., Disp: 90 tablet, Rfl: 3   hydrochlorothiazide  (HYDRODIURIL ) 12.5 MG tablet, Take 12.5 mg by mouth daily., Disp: , Rfl:    insulin  glargine (LANTUS ) 100 UNIT/ML Solostar Pen, Inject 80 Units into the skin at  bedtime., Disp: 45 mL, Rfl: 3   insulin  lispro (HUMALOG  KWIKPEN) 100 UNIT/ML KwikPen, Inject 10-16 Units into the skin 3 (three) times daily., Disp: 45 mL, Rfl: 3   levothyroxine  (SYNTHROID ) 125 MCG tablet, Take 1 tablet (125 mcg total) by mouth daily before breakfast., Disp: 90 tablet, Rfl: 3   levothyroxine  (SYNTHROID ) 150 MCG tablet, Take 150 mcg by mouth daily before breakfast., Disp: , Rfl:    metFORMIN  (GLUCOPHAGE -XR) 500 MG 24 hr tablet, Take 1 tablet (500 mg total) by mouth daily with breakfast. (Patient not taking: Reported on 05/29/2024), Disp: 90 tablet, Rfl: 3   metoprolol  tartrate (LOPRESSOR ) 25 MG tablet, TAKE ONE TABLET BY MOUTH TWICE DAILY, Disp: 180 tablet, Rfl: 3   NON FORMULARY, Take 1 tablet by mouth daily. Osteo Biflex (Patient reported), Disp: , Rfl:    omeprazole  (PRILOSEC) 40 MG capsule, Take 40 mg by mouth daily., Disp: , Rfl:    ondansetron  (ZOFRAN -ODT) 4 MG disintegrating tablet, Take 4 mg by mouth daily as needed for vomiting or nausea., Disp: , Rfl:    potassium chloride  SA (KLOR-CON  M) 20 MEQ tablet, Take 1 tablet (20 mEq total) by mouth daily. (Patient not taking: Reported on 05/29/2024), Disp: 90 tablet, Rfl: 3   Semaglutide , 2 MG/DOSE, 8 MG/3ML SOPN, Inject 2 mg as directed once a week., Disp: 3 mL, Rfl: 3      Objective:  There were no vitals taken for this visit. {Pulm Vitals (Optional):32837}  Physical Exam  Diagnostic Review:  {Labs (Optional):32838}     Assessment & Plan:   Assessment & Plan OSA (obstructive sleep apnea)  Snoring   No orders of the defined types were placed in this encounter.     No follow-ups on file.   Ashari Llewellyn, MD

## 2024-08-21 ENCOUNTER — Ambulatory Visit: Admitting: Pulmonary Disease

## 2024-08-21 ENCOUNTER — Encounter: Payer: Self-pay | Admitting: Pulmonary Disease

## 2024-08-21 VITALS — BP 113/71 | HR 102 | Ht 65.0 in | Wt 194.0 lb

## 2024-08-21 DIAGNOSIS — R0683 Snoring: Secondary | ICD-10-CM

## 2024-08-21 DIAGNOSIS — R251 Tremor, unspecified: Secondary | ICD-10-CM | POA: Diagnosis not present

## 2024-08-21 DIAGNOSIS — G4733 Obstructive sleep apnea (adult) (pediatric): Secondary | ICD-10-CM | POA: Diagnosis not present

## 2024-08-21 NOTE — Assessment & Plan Note (Signed)
 Moderate obstructive sleep apnea with AHI of 17.5 and nocturnal desaturation to 80%. Persistent daytime fatigue and excessive sleepiness due to untreated condition. Previous CPAP use discontinued over ten years ago. - Initiate new CPAP prescription, auto PAP 5 - 15 cm H2O along with heated humidification - Patient prefers full face mask since she is a mouth breather - Schedule follow-up within 90 days of CPAP initiation to assess efficacy and adherence.

## 2024-08-21 NOTE — Patient Instructions (Signed)
  VISIT SUMMARY: Today, we discussed your persistent fatigue and excessive daytime sleepiness, which are likely due to your untreated sleep apnea. We also addressed your tremor that began after a COVID-19 infection.  YOUR PLAN: MODERATE OBSTRUCTIVE SLEEP APNEA: You have moderate obstructive sleep apnea with an apnea-hypopnea index of 17.5 and nocturnal oxygen desaturation to 80%. This condition is causing your persistent daytime fatigue and excessive sleepiness. -We will start you on auto CPAP therapy with a machine that adjusts pressure automatically. -You will be fitted for a CPAP mask that covers both your nose and mouth due to mouth breathing. -We will coordinate with the CPAP provider to match your insurance and find a convenient fitting location. -Please schedule a follow-up appointment within 90 days of starting CPAP therapy to assess how well it is working and if you are using it regularly.  TREMOR: You have a tremor that started several years ago after a COVID-19 infection. It may be an essential tremor or stress-induced tremor. -We will refer you to a neurologist in [place] for further evaluation and management.

## 2024-09-03 DIAGNOSIS — E08649 Diabetes mellitus due to underlying condition with hypoglycemia without coma: Secondary | ICD-10-CM | POA: Diagnosis not present

## 2024-09-05 ENCOUNTER — Ambulatory Visit: Admitting: Nurse Practitioner

## 2024-09-05 DIAGNOSIS — Z794 Long term (current) use of insulin: Secondary | ICD-10-CM

## 2024-09-05 DIAGNOSIS — E119 Type 2 diabetes mellitus without complications: Secondary | ICD-10-CM

## 2024-09-05 DIAGNOSIS — E782 Mixed hyperlipidemia: Secondary | ICD-10-CM

## 2024-09-05 DIAGNOSIS — E039 Hypothyroidism, unspecified: Secondary | ICD-10-CM

## 2024-09-05 DIAGNOSIS — Z7985 Long-term (current) use of injectable non-insulin antidiabetic drugs: Secondary | ICD-10-CM

## 2024-09-05 DIAGNOSIS — Z7984 Long term (current) use of oral hypoglycemic drugs: Secondary | ICD-10-CM

## 2024-09-05 DIAGNOSIS — I10 Essential (primary) hypertension: Secondary | ICD-10-CM

## 2024-09-12 NOTE — Progress Notes (Signed)
 Sarah Pope                                          MRN: [id number]   09/12/2024   The [facility] Quality Team Specialist reviewed this patient medical record for the purposes of chart review for care gap closure. The following were reviewed: chart review for care gap closure-kidney health evaluation for diabetes:eGFR  and uACR.    [facility] Quality Team

## 2024-09-15 ENCOUNTER — Encounter: Payer: Self-pay | Admitting: Neurology

## 2024-09-22 ENCOUNTER — Telehealth (HOSPITAL_BASED_OUTPATIENT_CLINIC_OR_DEPARTMENT_OTHER): Payer: Self-pay | Admitting: *Deleted

## 2024-09-22 NOTE — Telephone Encounter (Signed)
 CMN received for CPAP/ Bipap supplies signed by provider and faxed confirmation received DME: [facility]

## 2024-10-27 ENCOUNTER — Ambulatory Visit: Admitting: Pulmonary Disease

## 2024-10-29 ENCOUNTER — Encounter: Admitting: Pulmonary Disease

## 2024-11-09 NOTE — Progress Notes (Unsigned)
 "  Assessment/Plan:   Assessment and Plan Assessment & Plan      ***  Subjective:   Discussed the use of AI scribe software for clinical note transcription with the patient, who gave verbal consent to proceed.  History of Present Illness     Sarah Pope was seen today in the movement disorders clinic for neurologic consultation at the request of Alghanim, Fahid, MD.  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  Tremor: {yes no:314532}   How long has it been going on? ***  At rest or with activation?  ***  When is it noted the most?  ***  Fam hx of tremor?  {yes wn:685467}  Located where?  ***  Affected by caffeine:  {yes no:314532}  Affected by alcohol:  {yes no:314532}  Affected by stress:  {yes no:314532}  Affected by fatigue:  {yes no:314532}  Spills soup if on spoon:  {yes no:314532}  Spills glass of liquid if full:  {yes no:314532}  Affects ADL's (tying shoes, brushing teeth, etc):  {yes no:314532}  Tremor inducing meds:  {yes no:314532}albuterol ; advair   Tremor improving meds: metoprolol   Other Specific Symptoms:  Voice: *** Sleep: ***  Vivid Dreams:  {yes no:314532}  Acting out dreams:  {yes no:314532} Wet Pillows: {yes no:314532} Postural symptoms:  {yes no:314532}  Falls?  {yes no:314532} Bradykinesia symptoms: {parkinson brady:18041} Loss of smell:  {yes no:314532} Loss of taste:  {yes no:314532} Urinary Incontinence:  {yes no:314532} Difficulty Swallowing:  {yes no:314532} Handwriting, micrographia: {yes no:314532} Trouble with ADL's:  {yes no:314532}  Trouble buttoning clothing: {yes no:314532} Depression:  {yes no:314532} Memory changes:  {yes no:314532} Hallucinations:  {yes no:314532}  visual distortions: {yes no:314532} N/V:  {yes no:314532} Lightheaded:  {yes no:314532}  Syncope: {yes no:314532} Diplopia:  {yes no:314532} Dyskinesia:  {yes no:314532}  Neuroimaging of the brain has not previously been performed.  It  *** available for my review today.  PREVIOUS MEDICATIONS: {Parkinson's RX:18200}  ALLERGIES:  Allergies[1]  CURRENT MEDICATIONS:  Current Outpatient Medications  Medication Instructions   albuterol  (VENTOLIN  HFA) 108 (90 Base) MCG/ACT inhaler 2 puffs, Inhalation, Every 6 hours PRN   aspirin  EC 81 mg, Daily   atorvastatin  (LIPITOR) 20 mg, Oral, Daily   atorvastatin  (LIPITOR) 40 mg, Daily   citalopram  (CELEXA ) 40 mg, Oral, Daily   doxycycline  (VIBRAMYCIN ) 100 mg, 2 times daily   Fluticasone -Salmeterol (ADVAIR  DISKUS) 250-50 MCG/DOSE AEPB 1 puff, Inhalation, 2 times daily   hydrochlorothiazide  (HYDRODIURIL ) 12.5 mg, Oral, Daily   hydrochlorothiazide  (HYDRODIURIL ) 12.5 mg, Daily   insulin  glargine (LANTUS ) 80 Units, Subcutaneous, Daily at bedtime   insulin  lispro (HUMALOG  KWIKPEN) 10-16 Units, Subcutaneous, 3 times daily   levothyroxine  (SYNTHROID ) 125 mcg, Oral, Daily before breakfast   levothyroxine  (SYNTHROID ) 150 mcg, Daily before breakfast   metFORMIN  (GLUCOPHAGE -XR) 500 mg, Oral, Daily with breakfast   metoprolol  tartrate (LOPRESSOR ) 25 mg, Oral, 2 times daily   NON FORMULARY 1 tablet, Daily   omeprazole  (PRILOSEC) 40 mg, Daily   ondansetron  (ZOFRAN -ODT) 4 mg, Daily PRN   potassium chloride  SA (KLOR-CON  M) 20 MEQ tablet 20 mEq, Oral, Daily   Semaglutide  (2 MG/DOSE) 2 mg, Injection, Weekly    Objective:   PHYSICAL EXAMINATION:    VITALS:  There were no vitals filed for this visit.  GEN:  The patient appears stated age and is in NAD. HEENT:  Normocephalic, atraumatic.  The mucous membranes are moist. The superficial temporal arteries are without ropiness or tenderness. CV:  RRR Lungs:  CTAB Neck/HEME:  There are no carotid bruits bilaterally.  Neurological examination:  Orientation: The patient is alert and oriented x3.  Cranial nerves: There is good facial symmetry.  Extraocular muscles are intact. The visual fields are full to confrontational testing. The speech is  fluent and clear. Soft palate rises symmetrically and there is no tongue deviation. Hearing is intact to conversational tone. Sensation: Sensation is intact to light touch throughout (facial, trunk, extremities). Vibration is intact at the bilateral big toe. There is no extinction with double simultaneous stimulation.  Motor: Strength is 5/5 in the bilateral upper and lower extremities.   Shoulder shrug is equal and symmetric.  There is no pronator drift. Deep tendon reflexes: Deep tendon reflexes are 2/4 at the bilateral biceps, triceps, brachioradialis, patella and achilles. Plantar responses are downgoing bilaterally.  Movement examination: Tone: There is ***tone in the bilateral upper extremities.  The tone in the lower extremities is ***.  Abnormal movements: *** Coordination:  There is *** decremation with RAM's, *** Gait and Station: The patient has *** difficulty arising out of a deep-seated chair without the use of the hands. The patient's stride length is ***.  The patient has a *** pull test.     I have reviewed and interpreted the following labs independently   Chemistry      Component Value Date/Time   NA 137 05/01/2024 1358   NA 140 08/15/2023 1155   K 4.7 05/01/2024 1358   CL 101 05/01/2024 1358   CO2 28 05/01/2024 1358   BUN 20 05/01/2024 1358   BUN 15 08/15/2023 1155   CREATININE 0.83 05/01/2024 1358      Component Value Date/Time   CALCIUM  9.6 05/01/2024 1358   ALKPHOS 84 08/15/2023 1155   AST 14 05/01/2024 1358   ALT 12 05/01/2024 1358   BILITOT 1.1 05/01/2024 1358   BILITOT 0.9 08/15/2023 1155      Lab Results  Component Value Date   TSH 1.81 05/01/2024   Lab Results  Component Value Date   WBC 4.6 07/24/2023   HGB 12.7 07/24/2023   HCT 39.8 07/24/2023   MCV 80.4 07/24/2023   PLT 203 07/24/2023      Total time spent on today's visit was ***greater than 60 minutes, including both face-to-face time and nonface-to-face time.  Time included that spent  on review of records (prior notes available to me/labs/imaging if pertinent), discussing treatment and goals, answering patient's questions and coordinating care.  Cc:  Trudy Vaughn FALCON, MD     [1]  Allergies Allergen Reactions   Codeine Nausea Only   Sulfa Antibiotics Itching   "

## 2024-11-11 ENCOUNTER — Ambulatory Visit: Admitting: Neurology

## 2024-11-11 ENCOUNTER — Encounter: Payer: Self-pay | Admitting: Neurology

## 2024-11-11 DIAGNOSIS — Z029 Encounter for administrative examinations, unspecified: Secondary | ICD-10-CM

## 2024-11-28 ENCOUNTER — Other Ambulatory Visit: Payer: Self-pay | Admitting: Nurse Practitioner

## 2024-11-28 NOTE — Telephone Encounter (Signed)
 Correct, will deny until she is seen in office.
